# Patient Record
Sex: Female | Born: 1950
Health system: Southern US, Community
[De-identification: ages and names within clinical notes are randomized; demographics above are authoritative.]

## PROBLEM LIST (undated history)

## (undated) DIAGNOSIS — T7840XA Allergy, unspecified, initial encounter: Secondary | ICD-10-CM

## (undated) DIAGNOSIS — G4730 Sleep apnea, unspecified: Secondary | ICD-10-CM

## (undated) DIAGNOSIS — L0390 Cellulitis, unspecified: Secondary | ICD-10-CM

## (undated) DIAGNOSIS — K219 Gastro-esophageal reflux disease without esophagitis: Secondary | ICD-10-CM

## (undated) DIAGNOSIS — L4050 Arthropathic psoriasis, unspecified: Secondary | ICD-10-CM

## (undated) DIAGNOSIS — I10 Essential (primary) hypertension: Secondary | ICD-10-CM

## (undated) DIAGNOSIS — E119 Type 2 diabetes mellitus without complications: Secondary | ICD-10-CM

## (undated) DIAGNOSIS — D171 Benign lipomatous neoplasm of skin and subcutaneous tissue of trunk: Secondary | ICD-10-CM

## (undated) DIAGNOSIS — M1990 Unspecified osteoarthritis, unspecified site: Secondary | ICD-10-CM

## (undated) DIAGNOSIS — R221 Localized swelling, mass and lump, neck: Secondary | ICD-10-CM

## (undated) HISTORY — DX: Essential (primary) hypertension: I10

## (undated) HISTORY — DX: Localized swelling, mass and lump, neck: R22.1

## (undated) HISTORY — PX: TUBAL LIGATION: SHX77

## (undated) HISTORY — DX: Arthropathic psoriasis, unspecified: L40.50

## (undated) HISTORY — DX: Allergy, unspecified, initial encounter: T78.40XA

## (undated) HISTORY — DX: Gastro-esophageal reflux disease without esophagitis: K21.9

## (undated) HISTORY — DX: Cellulitis, unspecified: L03.90

## (undated) HISTORY — DX: Sleep apnea, unspecified: G47.30

## (undated) HISTORY — DX: Unspecified osteoarthritis, unspecified site: M19.90

## (undated) HISTORY — DX: Benign lipomatous neoplasm of skin and subcutaneous tissue of trunk: D17.1

## (undated) HISTORY — PX: ABDOMINAL HYSTERECTOMY: SHX81

---
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
## 1998-04-30 HISTORY — PX: ABDOMINAL HYSTERECTOMY: SHX81

## 1998-04-30 HISTORY — PX: SUPRACERVICAL ABDOMINAL HYSTERECTOMY: SHX5393

## 2005-07-13 ENCOUNTER — Encounter: Admission: RE | Admit: 2005-07-13 | Discharge: 2005-07-13 | Payer: Self-pay | Admitting: Family Medicine

## 2006-07-29 ENCOUNTER — Encounter: Admission: RE | Admit: 2006-07-29 | Discharge: 2006-07-29 | Payer: Self-pay | Admitting: Family Medicine

## 2006-08-02 ENCOUNTER — Encounter: Admission: RE | Admit: 2006-08-02 | Discharge: 2006-08-02 | Payer: Self-pay | Admitting: Family Medicine

## 2007-01-16 ENCOUNTER — Encounter: Admission: RE | Admit: 2007-01-16 | Discharge: 2007-01-16 | Payer: Self-pay | Admitting: Family Medicine

## 2007-03-18 ENCOUNTER — Ambulatory Visit: Payer: Self-pay | Admitting: Internal Medicine

## 2008-03-18 ENCOUNTER — Ambulatory Visit: Payer: Self-pay

## 2009-03-21 ENCOUNTER — Ambulatory Visit: Payer: Self-pay | Admitting: Family Medicine

## 2010-02-07 ENCOUNTER — Ambulatory Visit: Payer: Self-pay | Admitting: Family Medicine

## 2010-02-23 ENCOUNTER — Ambulatory Visit: Payer: Self-pay | Admitting: Family Medicine

## 2010-05-21 ENCOUNTER — Encounter: Payer: Self-pay | Admitting: Family Medicine

## 2011-02-08 ENCOUNTER — Ambulatory Visit: Payer: Self-pay | Admitting: Family Medicine

## 2011-02-19 ENCOUNTER — Ambulatory Visit: Payer: Self-pay | Admitting: Family Medicine

## 2012-02-13 ENCOUNTER — Ambulatory Visit: Payer: Self-pay | Admitting: Family Medicine

## 2012-04-30 HISTORY — PX: OTHER SURGICAL HISTORY: SHX169

## 2013-02-05 ENCOUNTER — Ambulatory Visit: Payer: Self-pay | Admitting: Family Medicine

## 2013-02-13 ENCOUNTER — Ambulatory Visit: Payer: Self-pay | Admitting: Family Medicine

## 2013-03-04 DIAGNOSIS — L4050 Arthropathic psoriasis, unspecified: Secondary | ICD-10-CM | POA: Insufficient documentation

## 2013-03-24 ENCOUNTER — Encounter: Payer: Self-pay | Admitting: *Deleted

## 2013-04-07 ENCOUNTER — Ambulatory Visit (INDEPENDENT_AMBULATORY_CARE_PROVIDER_SITE_OTHER): Payer: BC Managed Care – PPO | Admitting: General Surgery

## 2013-04-07 ENCOUNTER — Encounter: Payer: Self-pay | Admitting: General Surgery

## 2013-04-07 ENCOUNTER — Ambulatory Visit: Payer: BC Managed Care – PPO

## 2013-04-07 VITALS — BP 118/78 | HR 62 | Resp 12 | Ht 64.0 in | Wt 165.0 lb

## 2013-04-07 DIAGNOSIS — R221 Localized swelling, mass and lump, neck: Secondary | ICD-10-CM | POA: Insufficient documentation

## 2013-04-07 DIAGNOSIS — R220 Localized swelling, mass and lump, head: Secondary | ICD-10-CM

## 2013-04-07 HISTORY — DX: Localized swelling, mass and lump, neck: R22.1

## 2013-04-07 NOTE — Progress Notes (Addendum)
Patient ID: Ashley Rodriguez, female   DOB: [birth year], 62 y.o.   MRN: [id number]  Chief Complaint  Patient presents with  . Other    lipoma on neck    HPI Ashley Rodriguez is a 62 y.o. female here today for a evaluation. Patient had a lipoma removed in January 2014 and states that area is starting to bother her again. The tags in her clothes bother the area.  The patient underwent excision of December 2013. At the time of her January 2014 exam she still appreciated thickening in this area which at that time was thought secondary to post procedure swelling. HPI  Past Medical History  Diagnosis Date  . Arthritis     Past Surgical History  Procedure Laterality Date  . Abdominal hysterectomy  2000  . Lipoma removed  04/2012    on neck    No family history on file.  Social History History  Substance Use Topics  . Smoking status: Never Smoker   . Smokeless tobacco: Never Used  . Alcohol Use: 3.0 oz/week    6 drink(s) per week    Allergies  Allergen Reactions  . Codeine Rash  . Sulfa Antibiotics Rash    Current Outpatient Prescriptions  Medication Sig Dispense Refill  . amLODipine (NORVASC) 5 MG tablet Take 5 mg by mouth daily.      Marland Kitchen atenolol (TENORMIN) 100 MG tablet Take 100 mg by mouth daily.      . calcium carbonate (CALCIUM 600) 600 MG TABS tablet Take 600 mg by mouth daily with breakfast.      . etanercept (ENBREL) 50 MG/ML injection Inject 50 mg into the skin once a week.      . Multiple Vitamins-Minerals (MULTIVITAMIN WITH MINERALS) tablet Take 1 tablet by mouth daily.      . naproxen sodium (ANAPROX) 220 MG tablet Take 220 mg by mouth 2 (two) times daily with a meal.      . piroxicam (FELDENE) 20 MG capsule Take 20 mg by mouth daily.      . prednisoLONE (ORAPRED ODT) 10 MG disintegrating tablet Take 10 mg by mouth daily.      . ranitidine (ZANTAC) 150 MG tablet Take 150 mg by mouth 2 (two) times daily.       No current facility-administered medications for this visit.     Review of Systems Review of Systems  Constitutional: Negative.   Respiratory: Negative.   Cardiovascular: Negative.     Blood pressure 118/78, pulse 62, resp. rate 12, height 5\' 4"  (1.626 m), weight 165 lb (74.844 kg).  Physical Exam Physical Exam  Constitutional: She is oriented to person, place, and time. She appears well-developed and well-nourished.  Eyes: No scleral icterus.  Neck:    Left posterior neck 3 cm fullness left of scar.  Cardiovascular: Normal rate, regular rhythm and normal heart sounds.   Pulmonary/Chest: Breath sounds normal.  Lymphadenopathy:    She has no cervical adenopathy.  Neurological: She is alert and oriented to person, place, and time.  Skin: Skin is warm and dry.    Data Reviewed Pathology from the April 01, 2012 excision showed dense fibrous tissue and fibroadipose tissue without evidence of malignancy. 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 2001 biopsy dated April 15, 2000 showed a lipoma in the left posterior neck.  Focused ultrasound examination of this area (no images, no charge) showed focal thickening in the subcutaneous tissue extending down to the level of the underlying  muscle without a well-defined mass. No vascular flow. This measured up to 3 cm in diameter.  Assessment    Left posterior neck mass. Chronic scarring versus underlying mass.    Plan    Options for management were reviewed: 1 continued observation unless increasing pain or sensitivity develops versus 2 formal evaluation with open neck MRI (history of claustrophobia) and formal excision under general anesthesia.  The patient will notify the office of how she would like to proceed.       Earline Mayotte 04/07/2013, 8:46 PM

## 2013-04-07 NOTE — Patient Instructions (Addendum)
Recommend an open MRI or patient to return and have the area removed. Patient to call us and let us know what she would like to do.

## 2013-04-08 ENCOUNTER — Other Ambulatory Visit: Payer: BC Managed Care – PPO

## 2013-04-08 DIAGNOSIS — R221 Localized swelling, mass and lump, neck: Secondary | ICD-10-CM

## 2013-04-08 NOTE — Addendum Note (Signed)
Addended by: Volney Presser on: 04/08/2013 12:16 PM   Modules accepted: Orders

## 2013-04-08 NOTE — Addendum Note (Signed)
Addended by: Volney Presser on: 04/08/2013 12:20 PM   Modules accepted: Orders

## 2013-04-28 ENCOUNTER — Encounter: Payer: Self-pay | Admitting: Podiatry

## 2013-04-28 ENCOUNTER — Ambulatory Visit (INDEPENDENT_AMBULATORY_CARE_PROVIDER_SITE_OTHER): Payer: BC Managed Care – PPO | Admitting: Podiatry

## 2013-04-28 VITALS — BP 134/68 | HR 60 | Resp 16 | Ht 64.0 in | Wt 162.0 lb

## 2013-04-28 DIAGNOSIS — Z79899 Other long term (current) drug therapy: Secondary | ICD-10-CM

## 2013-04-28 DIAGNOSIS — B351 Tinea unguium: Secondary | ICD-10-CM

## 2013-04-28 DIAGNOSIS — M069 Rheumatoid arthritis, unspecified: Secondary | ICD-10-CM

## 2013-04-28 MED ORDER — TERBINAFINE HCL 250 MG PO TABS: 250.0000 mg | ORAL_TABLET | Freq: Every day | ORAL | Status: DC

## 2013-04-29 LAB — HEPATIC FUNCTION PANEL
ALT: 23 IU/L (ref 0–32)
AST: 19 IU/L (ref 0–40)
Alkaline Phosphatase: 55 IU/L (ref 39–117)
Bilirubin, Direct: 0.1 mg/dL (ref 0.00–0.40)
Total Bilirubin: 0.3 mg/dL (ref 0.0–1.2)
Total Protein: 6.7 g/dL (ref 6.0–8.5)

## 2013-04-29 NOTE — Progress Notes (Signed)
Subjective:     Patient ID: Ashley Rodriguez, female   DOB: [birth year], 62 y.o.   MRN: [id number]  HPI patient presents stating several of my nails are still discolored and I want to see if there is anything we can do to help them. She states that she has not had laser done for approximately 10 months   Review of Systems     Objective:   Physical Exam Neurovascular status intact with no health history changes noted. Significant for foot malalignment secondary to psoriatic arthritis with nail disease of the big toenails both feet. Remaining nails have responded well to laser    Assessment:     Improve nail condition with continued probable fungal or psoriatic condition of the hallux nail both feet    Plan:     Reviewed condition and explained origin of nail disease along with trauma. Patient would like to try other treatment options and I have recommended oral treatment at this time reviewing the risk of oral treatment. She will confirm with her doctor that she can take Lamisil with her other medications and she is written a prescription today for 3 months of once a day treatment. I am sending for liver function panel prior to initiation of this treatment and reappoint 4 months earlier if any issues should occur

## 2013-04-30 HISTORY — PX: COLONOSCOPY: SHX174

## 2013-05-06 ENCOUNTER — Encounter: Payer: Self-pay | Admitting: General Surgery

## 2013-08-18 ENCOUNTER — Encounter: Payer: Self-pay | Admitting: Podiatry

## 2013-08-18 ENCOUNTER — Ambulatory Visit: Payer: BC Managed Care – PPO | Admitting: Podiatry

## 2013-08-18 VITALS — BP 161/88 | HR 62 | Resp 12

## 2013-08-18 DIAGNOSIS — M79609 Pain in unspecified limb: Secondary | ICD-10-CM

## 2013-08-18 DIAGNOSIS — B351 Tinea unguium: Secondary | ICD-10-CM

## 2013-08-18 NOTE — Progress Notes (Signed)
Subjective:     Patient ID: Ashley Rodriguez, female   DOB: 13-Oct-1950, 63 y.o.   MRN: [id number]  HPI patient states she is doing very well after having Lamisil and topical treatment for nails hallux both feet   Review of Systems     Objective:   Physical Exam Neurovascular status intact with nail disease which is getting better hallux both feet with distal yellow numbness in the one third category versus the entire nails at last visit    Assessment:     Doing well from chronic fungal infection of the big toes both feet    Plan:     Continue topical formula 3 usage and we will do 30 days of oral Lamisil in the fall. Overall doing well

## 2013-08-25 ENCOUNTER — Ambulatory Visit: Payer: BC Managed Care – PPO | Admitting: Podiatry

## 2013-09-17 ENCOUNTER — Ambulatory Visit: Payer: Self-pay | Admitting: Family Medicine

## 2014-02-26 LAB — HM COLONOSCOPY

## 2014-03-01 ENCOUNTER — Encounter: Payer: Self-pay | Admitting: Podiatry

## 2014-03-29 ENCOUNTER — Other Ambulatory Visit: Payer: Self-pay | Admitting: *Deleted

## 2014-03-29 NOTE — Telephone Encounter (Signed)
Patient called and stated that dr regal told her that she can have a refill on lamisil , she states she was suppose to call back in October and we would just refill the prescription. i explained to patient that she would need a follow up appointment , patient got upset and stated that she knows what dr Paulla Dolly said and that she just needed to call and get a refill for maintenance .

## 2014-03-30 MED ORDER — TERBINAFINE HCL 250 MG PO TABS: 250.0000 mg | ORAL_TABLET | Freq: Every day | ORAL | Status: DC

## 2014-03-30 NOTE — Telephone Encounter (Signed)
OK FOR LAMISIL PER DR REGAL

## 2014-03-31 NOTE — Telephone Encounter (Signed)
Give her 30 days worth of lamisil

## 2014-04-15 ENCOUNTER — Encounter: Payer: Self-pay | Admitting: General Surgery

## 2014-04-15 ENCOUNTER — Ambulatory Visit (INDEPENDENT_AMBULATORY_CARE_PROVIDER_SITE_OTHER): Payer: BC Managed Care – PPO | Admitting: General Surgery

## 2014-04-15 VITALS — BP 130/68 | HR 70 | Resp 12 | Ht 64.0 in | Wt 165.0 lb

## 2014-04-15 DIAGNOSIS — R221 Localized swelling, mass and lump, neck: Secondary | ICD-10-CM

## 2014-04-15 NOTE — Progress Notes (Signed)
Patient ID: Ashley Rodriguez, female   DOB: Aug 13, 1950, 63 y.o.   MRN: [id number]  Chief Complaint  Patient presents with  . Other    lipoma on neck    HPI Ashley Rodriguez is a 63 y.o. female here today for a re evaluation. Patient had a lipoma removed in January 2014 and states that area is starting to bother her again. The area was excised in December 2013 and she was seen in December 2014 with prominence of the lateral margin. At that time about a 3 cm fullness was appreciated.  The patient has been experiencing mild discomfort in this area, and also reports some tingling into the shoulder. The latter is unlikely to be related to the former. HPI  Past Medical History  Diagnosis Date  . Arthritis     Past Surgical History  Procedure Laterality Date  . Abdominal hysterectomy  2000  . Lipoma removed  04/2012    on neck  . Colonoscopy  2015    No family history on file.  Social History History  Substance Use Topics  . Smoking status: Never Smoker   . Smokeless tobacco: Never Used  . Alcohol Use: 3.0 oz/week    6 drink(s) per week    Allergies  Allergen Reactions  . Codeine Rash  . Sulfa Antibiotics Rash    Current Outpatient Prescriptions  Medication Sig Dispense Refill  . amLODipine (NORVASC) 5 MG tablet Take 5 mg by mouth daily.    Marland Kitchen atenolol (TENORMIN) 100 MG tablet Take 100 mg by mouth daily.    . calcium carbonate (CALCIUM 600) 600 MG TABS tablet Take 600 mg by mouth daily with breakfast.    . etanercept (ENBREL) 50 MG/ML injection Inject 50 mg into the skin once a week.    . Multiple Vitamins-Minerals (MULTIVITAMIN WITH MINERALS) tablet Take 1 tablet by mouth daily.    . piroxicam (FELDENE) 20 MG capsule Take 20 mg by mouth daily.    . polyethylene glycol-electrolytes (NULYTELY/GOLYTELY) 420 G solution See admin instructions.  0  . prednisoLONE (ORAPRED ODT) 10 MG disintegrating tablet Take 5 mg by mouth daily.     . ranitidine (ZANTAC) 300 MG capsule   6  .  terbinafine (LAMISIL) 250 MG tablet Take 1 tablet (250 mg total) by mouth daily. 30 tablet 0   No current facility-administered medications for this visit.    Review of Systems Review of Systems  Constitutional: Negative.   Respiratory: Negative.   Cardiovascular: Negative.     Blood pressure 130/68, pulse 70, resp. rate 12, height 5\' 4"  (1.626 m), weight 165 lb (74.844 kg).  Physical Exam Physical Exam  Constitutional: She is oriented to person, place, and time. She appears well-developed and well-nourished.  HENT:  Head:    Eyes: Conjunctivae are normal. No scleral icterus.  Neck: Neck supple.  3 by 4 left neck mass.   Cardiovascular: Normal rate, regular rhythm and normal heart sounds.   Pulmonary/Chest: Effort normal and breath sounds normal.  Lymphadenopathy:    She has no cervical adenopathy.  Neurological: She is alert and oriented to person, place, and time.  Skin: Skin is warm and dry.    Data Reviewed Pathology showed benign dense fibrous and fibroadipose tissue.  Assessment    Symptomatic nodularity of the left neck, likely residual fibroadipose tissue.    Plan    Patient to be scheduled for a MRI and excision neck mass .  Patient is scheduled for an open neck MRI with  and with out contrast at [place] for 04/25/14 at 3:00 pm. She will arrive by 1:25 pm, their is no special preparation needed. She is scheduled for surgery at [facility] on 04/28/14. She will pre admit by phone on 04/20/14. Surgery instructions and directions to [facility] have been mailed to the patient. She is aware of dates, time, and instructions.   The risks associated with surgery as well as potential injury of the spinal accessory nerve were reviewed.  PCP:  Jannet Mantis 04/16/2014, 12:12 PM

## 2014-04-15 NOTE — Patient Instructions (Addendum)
Patient to be scheduled for a MRI and excision neck mass .  Patient is scheduled for an open neck MRI with and with out contrast at [place] for 04/25/14 at 3:00 pm. She will arrive by 1:25 pm, their is no special preparation needed. She is scheduled for surgery at [facility] on 04/28/14. She will pre admit by phone on 04/20/14. Surgery instructions and directions to [facility] have been mailed to the patient. She is aware of dates, time, and instructions.

## 2014-04-16 ENCOUNTER — Other Ambulatory Visit: Payer: Self-pay | Admitting: General Surgery

## 2014-04-16 ENCOUNTER — Telehealth: Payer: Self-pay

## 2014-04-16 ENCOUNTER — Ambulatory Visit: Payer: Self-pay | Admitting: General Surgery

## 2014-04-16 DIAGNOSIS — R221 Localized swelling, mass and lump, neck: Secondary | ICD-10-CM

## 2014-04-16 DIAGNOSIS — IMO0002 Reserved for concepts with insufficient information to code with codable children: Secondary | ICD-10-CM

## 2014-04-16 DIAGNOSIS — R229 Localized swelling, mass and lump, unspecified: Principal | ICD-10-CM

## 2014-04-16 LAB — CREATININE, SERUM: CREATININE: 0.68 mg/dL (ref 0.60–1.30)

## 2014-04-16 LAB — BUN: BUN: 13 mg/dL (ref 7–18)

## 2014-04-16 NOTE — Telephone Encounter (Signed)
Patient contacted about scheduling her neck MRI and surgery. Patient is scheduled for an open neck MRI with and with out contrast at [place] for 04/25/14 at 3:00 pm. She will arrive by 1:25 pm, their is no special preparation needed. She will go today to the hospital to have her BUN and Creatinine labs drawn, these will be faxed to [place] once results are back. She is scheduled for surgery at [facility] on 04/28/14. She will pre admit by phone on 04/20/14. Surgery instructions and directions to [facility] have been mailed to the patient. She is aware of dates, time, and instructions.

## 2014-04-19 ENCOUNTER — Encounter: Payer: Self-pay | Admitting: General Surgery

## 2014-04-21 ENCOUNTER — Ambulatory Visit: Payer: Self-pay | Admitting: Anesthesiology

## 2014-04-25 ENCOUNTER — Ambulatory Visit
Admission: RE | Admit: 2014-04-25 | Discharge: 2014-04-25 | Disposition: A | Discharge status: Home / Self Care | Payer: Self-pay | Source: Ambulatory Visit | Attending: General Surgery | Admitting: General Surgery

## 2014-04-25 DIAGNOSIS — IMO0002 Reserved for concepts with insufficient information to code with codable children: Secondary | ICD-10-CM

## 2014-04-25 DIAGNOSIS — R229 Localized swelling, mass and lump, unspecified: Principal | ICD-10-CM

## 2014-04-26 ENCOUNTER — Telehealth: Payer: Self-pay

## 2014-04-26 NOTE — Telephone Encounter (Signed)
-----   Message from Robert Bellow, MD sent at 04/26/2014  2:12 PM EST ----- Please schedule CT neck w/ contrast: left posterior neck mass. If this can be done tomorrow, we can proceed with planned excision on Wednesday. If not, the OR date will need to be rescheduled. Thanks.

## 2014-04-26 NOTE — Telephone Encounter (Signed)
Patient called and said that she was unable to have her open MRI done on 04/25/14. She states that it was not "open enough" for her and she was not able to complete her scan. She was concerned about not being able to have her surgery done on 04/28/14. I let her know that I would contact Dr Bary Castilla and let him know and we would call her back about her surgery.

## 2014-04-26 NOTE — Telephone Encounter (Signed)
Spoke with patient about scheduling a CT of the neck with contrast, patient is amendable to this. Patient is scheduled for CT with contrast of the neck at [facility] location on 04/27/14 at 10:00 am. She is to arrive by 9:45 am and to bring a list of medications with her. Their is no special prep needed for this scan. Patient is aware of date, time, and instructions.

## 2014-04-27 ENCOUNTER — Encounter: Payer: Self-pay | Admitting: General Surgery

## 2014-04-27 ENCOUNTER — Ambulatory Visit: Payer: Self-pay | Admitting: General Surgery

## 2014-04-28 ENCOUNTER — Ambulatory Visit: Payer: Self-pay | Admitting: General Surgery

## 2014-04-28 DIAGNOSIS — D170 Benign lipomatous neoplasm of skin and subcutaneous tissue of head, face and neck: Secondary | ICD-10-CM

## 2014-04-28 HISTORY — PX: NECK MASS EXCISION: SHX2079

## 2014-04-29 ENCOUNTER — Encounter: Payer: Self-pay | Admitting: General Surgery

## 2014-05-03 ENCOUNTER — Encounter: Payer: Self-pay | Admitting: General Surgery

## 2014-05-03 ENCOUNTER — Ambulatory Visit (INDEPENDENT_AMBULATORY_CARE_PROVIDER_SITE_OTHER): Payer: BLUE CROSS/BLUE SHIELD | Admitting: General Surgery

## 2014-05-03 VITALS — BP 124/70 | HR 76 | Resp 12 | Ht 64.0 in | Wt 167.0 lb

## 2014-05-03 DIAGNOSIS — R221 Localized swelling, mass and lump, neck: Secondary | ICD-10-CM

## 2014-05-03 NOTE — Progress Notes (Signed)
Patient ID: Ashley Rodriguez, female   DOB: [birth year], 64 y.o.   MRN: [id number]  Chief Complaint  Patient presents with  . Routine Post Op    neck lesion    HPI Ashley Rodriguez is a 64 y.o. female. here today for her post op excision neck mass done on 04/28/14. Patient states she is doing well. HPI  Past Medical History  Diagnosis Date  . Arthritis     Past Surgical History  Procedure Laterality Date  . Abdominal hysterectomy  2000  . Lipoma removed  04/2012    on neck  . Colonoscopy  2015  . Neck mass excision  04/28/14    No family history on file.  Social History History  Substance Use Topics  . Smoking status: Never Smoker   . Smokeless tobacco: Never Used  . Alcohol Use: 3.0 oz/week    6 drink(s) per week    Allergies  Allergen Reactions  . Codeine Rash  . Sulfa Antibiotics Rash    Current Outpatient Prescriptions  Medication Sig Dispense Refill  . amLODipine (NORVASC) 5 MG tablet Take 5 mg by mouth daily.    Marland Kitchen atenolol (TENORMIN) 100 MG tablet Take 100 mg by mouth daily.    . calcium carbonate (CALCIUM 600) 600 MG TABS tablet Take 600 mg by mouth daily with breakfast.    . etanercept (ENBREL) 50 MG/ML injection Inject 50 mg into the skin once a week.    . Multiple Vitamins-Minerals (MULTIVITAMIN WITH MINERALS) tablet Take 1 tablet by mouth daily.    . piroxicam (FELDENE) 20 MG capsule Take 20 mg by mouth daily.    . prednisoLONE (ORAPRED ODT) 10 MG disintegrating tablet Take 5 mg by mouth daily.     . ranitidine (ZANTAC) 300 MG capsule   6  . terbinafine (LAMISIL) 250 MG tablet Take 1 tablet (250 mg total) by mouth daily. 30 tablet 0  . polyethylene glycol-electrolytes (NULYTELY/GOLYTELY) 420 G solution See admin instructions.  0   No current facility-administered medications for this visit.    Review of Systems Review of Systems  Constitutional: Negative.   Respiratory: Negative.   Cardiovascular: Negative.     Blood pressure 124/70, pulse 76, resp.  rate 12, height 5\' 4"  (1.626 m), weight 167 lb (75.751 kg).  Physical Exam Physical Exam  Constitutional: She is oriented to person, place, and time. She appears well-developed and well-nourished.  Eyes: Conjunctivae are normal. No scleral icterus.  Neck: Neck supple.  Left neck Incision looks clean and healing well.    Lymphadenopathy:    She has no cervical adenopathy.  Neurological: She is alert and oriented to person, place, and time.  Skin: Skin is warm and dry.    Data Reviewed Pathology pending  Assessment    Doing well status post reexcision left base of the neck lipoma.    Plan    Daily application of cream to soften the scars was encouraged. She may use a dressing if desired. Patient to return as needed.       PCP: Jannet Mantis 05/03/2014, 7:56 PM

## 2014-05-04 ENCOUNTER — Encounter: Payer: Self-pay | Admitting: General Surgery

## 2014-07-02 ENCOUNTER — Ambulatory Visit (INDEPENDENT_AMBULATORY_CARE_PROVIDER_SITE_OTHER): Payer: BLUE CROSS/BLUE SHIELD | Admitting: Podiatry

## 2014-07-02 VITALS — BP 130/71 | HR 63 | Resp 16

## 2014-07-02 DIAGNOSIS — M216X9 Other acquired deformities of unspecified foot: Secondary | ICD-10-CM

## 2014-07-02 DIAGNOSIS — Q667 Congenital pes cavus: Secondary | ICD-10-CM

## 2014-07-02 DIAGNOSIS — L84 Corns and callosities: Secondary | ICD-10-CM | POA: Diagnosis not present

## 2014-07-02 DIAGNOSIS — B351 Tinea unguium: Secondary | ICD-10-CM | POA: Diagnosis not present

## 2014-07-03 NOTE — Progress Notes (Signed)
Subjective:     Patient ID: Ashley Rodriguez, female   DOB: 10-Sep-1950, 64 y.o.   MRN: [id number]  HPI patient presents with painful lesion on the bottom of the left foot that's making walking and shoe gear difficult and states that she has tried to take care of herself   Review of Systems     Objective:   Physical Exam Neurovascular status intact with muscle strength adequate and noted to have plantar keratotic lesion left it's painful when pressed and making walking and shoe gear difficult    Assessment:     Plantarflexed metatarsal with keratotic lesion formation and pain    Plan:     H&P condition discussed and active debridement accomplished with possibility for orthotics or bone structural changes or injection if symptoms persist

## 2014-08-05 ENCOUNTER — Ambulatory Visit (INDEPENDENT_AMBULATORY_CARE_PROVIDER_SITE_OTHER): Payer: BLUE CROSS/BLUE SHIELD | Admitting: General Surgery

## 2014-08-05 ENCOUNTER — Encounter: Payer: Self-pay | Admitting: General Surgery

## 2014-08-05 DIAGNOSIS — D170 Benign lipomatous neoplasm of skin and subcutaneous tissue of head, face and neck: Secondary | ICD-10-CM

## 2014-08-05 NOTE — Patient Instructions (Signed)
The patient is aware to call back for any questions or concerns.  

## 2014-08-05 NOTE — Progress Notes (Signed)
Patient ID: Delani Kohli, female   DOB: May 23, 1950, 64 y.o.   MRN: [id number]  Chief Complaint  Patient presents with  . Follow-up    HPI Ravonda Brecheen is a 64 y.o. female.  Here today for evaluation of puffiness and swelling at surgical site. She is post excision neck mass done on 04/28/14 that showed to be a lipoma. She is using vitamin E cream twice a day.   HPI  Past Medical History  Diagnosis Date  . Arthritis     Past Surgical History  Procedure Laterality Date  . Abdominal hysterectomy  2000  . Lipoma removed  04/2012    on neck  . Colonoscopy  2015  . Neck mass excision  04/28/14    lipoma    No family history on file.  Social History History  Substance Use Topics  . Smoking status: Never Smoker   . Smokeless tobacco: Never Used  . Alcohol Use: 3.0 oz/week    6 drink(s) per week    Allergies  Allergen Reactions  . Codeine Rash  . Sulfa Antibiotics Rash    Current Outpatient Prescriptions  Medication Sig Dispense Refill  . amLODipine (NORVASC) 5 MG tablet Take 5 mg by mouth daily.    Marland Kitchen atenolol (TENORMIN) 100 MG tablet Take 100 mg by mouth daily.    . calcium carbonate (CALCIUM 600) 600 MG TABS tablet Take 600 mg by mouth daily with breakfast.    . etanercept (ENBREL) 50 MG/ML injection Inject 50 mg into the skin once a week.    . Multiple Vitamins-Minerals (MULTIVITAMIN WITH MINERALS) tablet Take 1 tablet by mouth daily.    . piroxicam (FELDENE) 20 MG capsule Take 20 mg by mouth daily.    . prednisoLONE (ORAPRED ODT) 10 MG disintegrating tablet Take 10 mg by mouth daily.     . valACYclovir (VALTREX) 500 MG tablet Take 500 mg by mouth as needed.    . ranitidine (ZANTAC) 300 MG capsule Take 300 mg by mouth every evening.   6   No current facility-administered medications for this visit.    Review of Systems Review of Systems  Constitutional: Negative.   Respiratory: Negative.   Cardiovascular: Negative.     Blood pressure 120/72, pulse 64, resp.  rate 12, height 5\' 4"  (1.626 m), weight 168 lb (76.204 kg).  Physical Exam Physical Exam  Constitutional: She is oriented to person, place, and time. She appears well-developed and well-nourished.  Neck:    Neurological: She is alert and oriented to person, place, and time.  Skin: Skin is warm and dry.  Posterior neck incision healed.    Data Reviewed December 2015 operative report and pathology report.  Assessment    Asymmetry secondary to scarring at the site of reexcision.    Plan    Patient was reassured there is no evidence of recurrent lipoma formation. She may make use of massage or scar creams as desired.    Follow up as needed.   PCP:  Jannet Mantis 08/06/2014, 9:36 PM

## 2014-08-21 NOTE — Op Note (Signed)
PATIENT NAME:  Ashley Rodriguez, SITZMANN MR#:  [id number] DATE OF BIRTH:  1950-09-04  DATE OF PROCEDURE:  04/28/2014  PREOPERATIVE DIAGNOSIS:  Recurrent lipoma of the posterior neck.   POSTOPERATIVE DIAGNOSIS: Recurrent lipoma of the posterior neck.   OPERATIVE PROCEDURE: Excision of lipoma of the posterior neck.   SURGEON: Robert Bellow, MD  ANESTHESIA: General endotracheal under Dr. Myra Gianotti, Marcaine 0.5% with 1:200,000 units of epinephrine, 20 mL local infiltration.   ESTIMATED BLOOD LOSS: Minimal.   CLINICAL NOTE: This 64 year old woman has had a multiply recurrent posterior neck lipoma. The area has become increasingly symptomatic. CT scan showed an up to 5 cm diameter lipoma above the level of the deep fascia.  She was admitted for elective excision.   OPERATIVE NOTE: With the patient under general anesthesia, she was rolled to the prone position and carefully padded and protected from pressure points. The skin of the back was retracted making use of broad-based tape. The area of surgery was then prepped with ChloraPrep and draped. The old incision was opened and extended approximately 1 cm both to the right and to the left of the midline. The skin and subcutaneous tissue was divided sharply and the remaining dissection completed with electrocautery. All the pseudopods of the lipoma-like mass were removed. The excision was carried down to the deep fascia which in part was removed with the specimen. The inferior skin was then mobilized with cautery and the wound was closed in layers with interrupted 2-0 Vicryl figure-of-8 sutures to obliterate the dead space and the skin actually approximated with a running 3-0 Vicryl deep dermal suture. Benzoin and Steri-Strips followed by Telfa and Tegaderm dressings were then applied.   The patient tolerated the procedure well and was taken to the recovery room in stable condition.   ____________________________ Robert Bellow, MD jwb:LT D: 04/28/2014  20:50:48 ET T: 04/28/2014 21:12:58 ET JOB#: [id number]  cc: Robert Bellow, MD, <Dictator> Shaneal Barasch Amedeo Kinsman MD ELECTRONICALLY SIGNED 04/29/2014 8:47

## 2014-08-23 LAB — SURGICAL PATHOLOGY

## 2014-09-23 ENCOUNTER — Other Ambulatory Visit: Payer: Self-pay | Admitting: Family Medicine

## 2014-09-23 DIAGNOSIS — R928 Other abnormal and inconclusive findings on diagnostic imaging of breast: Secondary | ICD-10-CM

## 2014-10-01 ENCOUNTER — Ambulatory Visit: Payer: Self-pay

## 2014-10-01 ENCOUNTER — Ambulatory Visit
Admission: RE | Admit: 2014-10-01 | Discharge: 2014-10-01 | Disposition: A | Discharge status: Home / Self Care | Payer: BLUE CROSS/BLUE SHIELD | Source: Ambulatory Visit | Attending: Family Medicine | Admitting: Family Medicine

## 2014-10-01 ENCOUNTER — Other Ambulatory Visit: Payer: Self-pay | Admitting: Family Medicine

## 2014-10-01 DIAGNOSIS — R928 Other abnormal and inconclusive findings on diagnostic imaging of breast: Secondary | ICD-10-CM | POA: Diagnosis not present

## 2014-10-01 DIAGNOSIS — R921 Mammographic calcification found on diagnostic imaging of breast: Secondary | ICD-10-CM | POA: Insufficient documentation

## 2015-08-25 DIAGNOSIS — B078 Other viral warts: Secondary | ICD-10-CM | POA: Diagnosis not present

## 2015-08-25 DIAGNOSIS — D1801 Hemangioma of skin and subcutaneous tissue: Secondary | ICD-10-CM | POA: Diagnosis not present

## 2015-08-25 DIAGNOSIS — C44529 Squamous cell carcinoma of skin of other part of trunk: Secondary | ICD-10-CM | POA: Diagnosis not present

## 2015-08-25 DIAGNOSIS — D485 Neoplasm of uncertain behavior of skin: Secondary | ICD-10-CM | POA: Diagnosis not present

## 2015-08-25 DIAGNOSIS — L812 Freckles: Secondary | ICD-10-CM | POA: Diagnosis not present

## 2015-08-25 DIAGNOSIS — L570 Actinic keratosis: Secondary | ICD-10-CM | POA: Diagnosis not present

## 2015-08-25 DIAGNOSIS — Z1283 Encounter for screening for malignant neoplasm of skin: Secondary | ICD-10-CM | POA: Diagnosis not present

## 2015-08-25 DIAGNOSIS — Z808 Family history of malignant neoplasm of other organs or systems: Secondary | ICD-10-CM | POA: Diagnosis not present

## 2015-09-14 DIAGNOSIS — C44529 Squamous cell carcinoma of skin of other part of trunk: Secondary | ICD-10-CM | POA: Diagnosis not present

## 2015-09-20 DIAGNOSIS — E7800 Pure hypercholesterolemia, unspecified: Secondary | ICD-10-CM | POA: Diagnosis not present

## 2015-09-20 DIAGNOSIS — R7303 Prediabetes: Secondary | ICD-10-CM | POA: Diagnosis not present

## 2015-09-20 DIAGNOSIS — L4050 Arthropathic psoriasis, unspecified: Secondary | ICD-10-CM | POA: Diagnosis not present

## 2015-09-20 DIAGNOSIS — I10 Essential (primary) hypertension: Secondary | ICD-10-CM | POA: Diagnosis not present

## 2015-09-20 DIAGNOSIS — E041 Nontoxic single thyroid nodule: Secondary | ICD-10-CM | POA: Diagnosis not present

## 2015-10-13 DIAGNOSIS — Z79899 Other long term (current) drug therapy: Secondary | ICD-10-CM | POA: Diagnosis not present

## 2015-10-13 DIAGNOSIS — M2550 Pain in unspecified joint: Secondary | ICD-10-CM | POA: Diagnosis not present

## 2015-10-13 DIAGNOSIS — L409 Psoriasis, unspecified: Secondary | ICD-10-CM | POA: Diagnosis not present

## 2015-10-13 DIAGNOSIS — L4050 Arthropathic psoriasis, unspecified: Secondary | ICD-10-CM | POA: Diagnosis not present

## 2015-11-15 DIAGNOSIS — Z08 Encounter for follow-up examination after completed treatment for malignant neoplasm: Secondary | ICD-10-CM | POA: Diagnosis not present

## 2015-11-15 DIAGNOSIS — Z85828 Personal history of other malignant neoplasm of skin: Secondary | ICD-10-CM | POA: Diagnosis not present

## 2015-11-15 DIAGNOSIS — L905 Scar conditions and fibrosis of skin: Secondary | ICD-10-CM | POA: Diagnosis not present

## 2015-11-15 DIAGNOSIS — D235 Other benign neoplasm of skin of trunk: Secondary | ICD-10-CM | POA: Diagnosis not present

## 2016-02-02 DIAGNOSIS — Z23 Encounter for immunization: Secondary | ICD-10-CM | POA: Diagnosis not present

## 2016-02-14 DIAGNOSIS — L812 Freckles: Secondary | ICD-10-CM | POA: Diagnosis not present

## 2016-02-14 DIAGNOSIS — D1801 Hemangioma of skin and subcutaneous tissue: Secondary | ICD-10-CM | POA: Diagnosis not present

## 2016-02-14 DIAGNOSIS — L570 Actinic keratosis: Secondary | ICD-10-CM | POA: Diagnosis not present

## 2016-02-14 DIAGNOSIS — L821 Other seborrheic keratosis: Secondary | ICD-10-CM | POA: Diagnosis not present

## 2016-02-14 DIAGNOSIS — L400 Psoriasis vulgaris: Secondary | ICD-10-CM | POA: Diagnosis not present

## 2016-02-21 ENCOUNTER — Other Ambulatory Visit: Payer: Self-pay | Admitting: Family Medicine

## 2016-02-21 DIAGNOSIS — Z1231 Encounter for screening mammogram for malignant neoplasm of breast: Secondary | ICD-10-CM

## 2016-03-02 DIAGNOSIS — M2550 Pain in unspecified joint: Secondary | ICD-10-CM | POA: Diagnosis not present

## 2016-03-02 DIAGNOSIS — Z79899 Other long term (current) drug therapy: Secondary | ICD-10-CM | POA: Diagnosis not present

## 2016-03-02 DIAGNOSIS — L409 Psoriasis, unspecified: Secondary | ICD-10-CM | POA: Diagnosis not present

## 2016-03-02 DIAGNOSIS — M5432 Sciatica, left side: Secondary | ICD-10-CM | POA: Diagnosis not present

## 2016-03-02 DIAGNOSIS — L4050 Arthropathic psoriasis, unspecified: Secondary | ICD-10-CM | POA: Diagnosis not present

## 2016-03-07 ENCOUNTER — Ambulatory Visit
Admission: RE | Admit: 2016-03-07 | Discharge: 2016-03-07 | Disposition: A | Discharge status: Home / Self Care | Payer: BLUE CROSS/BLUE SHIELD | Source: Ambulatory Visit | Attending: Family Medicine | Admitting: Family Medicine

## 2016-03-07 DIAGNOSIS — Z1231 Encounter for screening mammogram for malignant neoplasm of breast: Secondary | ICD-10-CM | POA: Insufficient documentation

## 2016-04-18 ENCOUNTER — Encounter: Payer: Self-pay | Admitting: Podiatry

## 2016-04-18 ENCOUNTER — Ambulatory Visit (INDEPENDENT_AMBULATORY_CARE_PROVIDER_SITE_OTHER): Payer: BLUE CROSS/BLUE SHIELD | Admitting: Podiatry

## 2016-04-18 DIAGNOSIS — R7303 Prediabetes: Secondary | ICD-10-CM | POA: Diagnosis not present

## 2016-04-18 DIAGNOSIS — Q828 Other specified congenital malformations of skin: Secondary | ICD-10-CM | POA: Diagnosis not present

## 2016-04-18 DIAGNOSIS — H5203 Hypermetropia, bilateral: Secondary | ICD-10-CM | POA: Diagnosis not present

## 2016-04-18 DIAGNOSIS — E7800 Pure hypercholesterolemia, unspecified: Secondary | ICD-10-CM | POA: Diagnosis not present

## 2016-04-18 DIAGNOSIS — E041 Nontoxic single thyroid nodule: Secondary | ICD-10-CM | POA: Diagnosis not present

## 2016-04-18 DIAGNOSIS — L4050 Arthropathic psoriasis, unspecified: Secondary | ICD-10-CM | POA: Diagnosis not present

## 2016-04-18 DIAGNOSIS — I10 Essential (primary) hypertension: Secondary | ICD-10-CM | POA: Diagnosis not present

## 2016-04-19 NOTE — Progress Notes (Signed)
She presents today with a chief complaint of a painful callus sub-fourth metatarsal of the right foot. States that it is extremely painful and prevents her from performing her daily activities.  Objective: Vital signs are stable she is alert and oriented 3. Pulses are palpable. She has a superficial porokeratotic lesion sub-fourth metatarsal head of the right foot.  Assessment: Porokeratosis right foot.  Plan: Debrided reactive hyperkeratotic lesion place salicylic acid under occlusion to be left on for 2-3 days without getting wet she states this off and then washed it thoroughly. I will follow up with her in 6 weeks if necessary otherwise when necessary.

## 2016-05-24 DIAGNOSIS — E119 Type 2 diabetes mellitus without complications: Secondary | ICD-10-CM | POA: Diagnosis not present

## 2016-05-24 DIAGNOSIS — E7800 Pure hypercholesterolemia, unspecified: Secondary | ICD-10-CM | POA: Diagnosis not present

## 2016-06-18 ENCOUNTER — Ambulatory Visit: Payer: BLUE CROSS/BLUE SHIELD | Admitting: Dietician

## 2016-07-04 ENCOUNTER — Encounter: Payer: Self-pay | Admitting: Dietician

## 2016-07-04 ENCOUNTER — Encounter: Payer: BLUE CROSS/BLUE SHIELD | Attending: Family Medicine | Admitting: Dietician

## 2016-07-04 VITALS — Ht 64.5 in | Wt 157.7 lb

## 2016-07-04 DIAGNOSIS — E119 Type 2 diabetes mellitus without complications: Secondary | ICD-10-CM | POA: Diagnosis not present

## 2016-07-04 NOTE — Patient Instructions (Signed)
   Continue with your current diet and eating pattern, you have made some healthy changes that have likely already helped you blood sugar.

## 2016-07-04 NOTE — Progress Notes (Signed)
Medical Nutrition Therapy: Visit start time: 1100  end time: 1200  Assessment:  Diagnosis: Type 2 Diabetes Past medical history: psoriatic arthritis Psychosocial issues/ stress concerns: none, some family stress Preferred learning method:  . Hands-on  Current weight: 157.7lbs with shoes and sweater  Height: 5'4.5" Medications, supplements: reconciled list in medical record.  Progress and evaluation: Recent diagnosis of Type 2 DM due to increase in HbA1C above 6.5%. Pateient states she has been in pre-diabetes state for 1-2 years. She has been making diet changes such as eliminating sweets other than occasional Dove chocolate. She reports weight loss of about 10-11lbs since 03/2016. Now using protein bars with dark chocolate for treats, not daily.    Physical activity: walking 35-40 minutes, 4 times a week (recently increased)  Dietary Intake:  Usual eating pattern includes 2-3 meals and 1-2 snacks per day. Dining out frequency: 5-6 meals per week.  Breakfast: eats at work -- cottage cheese and berries, protein bar, low sugar oatmeal, might start plain cheerios; decaf black coffee Snack: none Lunch: protein bar, nabs, doritos, taco, salad. Quick meal at work.  Snack: occasional snack bar/ protein Supper: pork chop, spaghetti, wrap with chicken, avocado, limits starches, salads Snack: none Beverages: water, stopped 1/2-sweet tea other than occasionally at lunch, diet pepsi (due to no aspartame)  Nutrition Care Education: Topics covered: diabetes Basic nutrition: basic food groups, appropriate nutrient balance, appropriate meal and snack schedule, general nutrition guidelines    Weight control: benefits of weight control Diabetes: appropriate meal and snack schedule, appropriate carb intake and balance, plate method for planning balanced meals   Nutritional Diagnosis:  [place]-2.2 Altered nutrition-related laboratory As related to diabetes.  As evidenced by lab report.  Intervention:  Instruction as noted above.   Patient has made positive changes and is motivated to maintain healthy lifestyle habits.   No follow-up needed at this time. Encouraged patient to call with any questions.   Education Materials given:  . General diet guidelines for Diabetes . Plate Planner with food lists . Sample meal pattern/ menus . Goals/ instructions  Learner/ who was taught:  . Patient   Level of understanding: Marland Kitchen Verbalizes/ demonstrates competency  Demonstrated degree of understanding via:   Teach back Learning barriers: . None  Willingness to learn/ readiness for change: . Eager, change in progress  Monitoring and Evaluation:  Dietary intake, exercise, BG control, and body weight      follow up: prn

## 2016-08-14 ENCOUNTER — Encounter: Payer: Self-pay | Admitting: *Deleted

## 2016-08-16 ENCOUNTER — Ambulatory Visit (INDEPENDENT_AMBULATORY_CARE_PROVIDER_SITE_OTHER): Payer: BLUE CROSS/BLUE SHIELD | Admitting: General Surgery

## 2016-08-16 ENCOUNTER — Encounter: Payer: Self-pay | Admitting: General Surgery

## 2016-08-16 VITALS — BP 120/78 | HR 62 | Resp 12 | Ht 64.0 in | Wt 150.0 lb

## 2016-08-16 DIAGNOSIS — D213 Benign neoplasm of connective and other soft tissue of thorax: Secondary | ICD-10-CM

## 2016-08-16 DIAGNOSIS — D171 Benign lipomatous neoplasm of skin and subcutaneous tissue of trunk: Secondary | ICD-10-CM | POA: Diagnosis not present

## 2016-08-16 NOTE — Patient Instructions (Signed)
May shower May use ice pack as needed for comfort May remove dressing in 2-3 days. Steri strip will gradually come off in 2-3 weeks.

## 2016-08-16 NOTE — Progress Notes (Signed)
Dictation #1 [id number]  [id number] Patient ID: Ashley Rodriguez, female   DOB: April 07, 1951, 66 y.o.   MRN: [id number]  Chief Complaint  Patient presents with  . Procedure    HPI Ashley Rodriguez is a 66 y.o. female.  Here today for evaluation of a lipoma right flank that she would like removed. It has been there a year or more. She has recently lost some weight and noticies it more. She has had several removed in the past.  HPI  Past Medical History:  Diagnosis Date  . Arthritis     Past Surgical History:  Procedure Laterality Date  . ABDOMINAL HYSTERECTOMY  2000  . COLONOSCOPY  2015  . lipoma removed  04/2012   on neck  . NECK MASS EXCISION  04/28/14   lipoma    No family history on file.  Social History Social History  Substance Use Topics  . Smoking status: Never Smoker  . Smokeless tobacco: Never Used  . Alcohol use 2.4 oz/week    4 Standard drinks or equivalent per week    Allergies  Allergen Reactions  . Codeine Rash  . Sulfa Antibiotics Rash    Current Outpatient Prescriptions  Medication Sig Dispense Refill  . acetaminophen (TYLENOL) 325 MG tablet Take 325 mg by mouth every 6 (six) hours as needed.    Marland Kitchen amLODipine (NORVASC) 5 MG tablet Take 5 mg by mouth daily.    Marland Kitchen atenolol (TENORMIN) 100 MG tablet Take 100 mg by mouth daily.    . calcium carbonate (CALCIUM 600) 600 MG TABS tablet Take 600 mg by mouth daily with breakfast.    . etanercept (ENBREL) 50 MG/ML injection Inject 50 mg into the skin once a week.    . fexofenadine (ALLEGRA) 180 MG tablet Take 180 mg by mouth daily.    . fluticasone (FLONASE) 50 MCG/ACT nasal spray   4  . meloxicam (MOBIC) 15 MG tablet Take 15 mg by mouth daily.   2  . montelukast (SINGULAIR) 10 MG tablet   11  . Multiple Vitamins-Minerals (MULTIVITAMIN WITH MINERALS) tablet Take 1 tablet by mouth daily.    . prednisoLONE (ORAPRED ODT) 10 MG disintegrating tablet Take 10 mg by mouth daily.     . valACYclovir (VALTREX) 500 MG  tablet Take 500 mg by mouth as needed.     No current facility-administered medications for this visit.     Review of Systems Review of Systems  Constitutional: Negative.   Respiratory: Negative.   Cardiovascular: Negative.     Blood pressure 120/78, pulse 62, resp. rate 12, height 5\' 4"  (1.626 m), weight 150 lb (68 kg).  Physical Exam Physical Exam  Constitutional: She is oriented to person, place, and time. She appears well-developed and well-nourished.  HENT:  Mouth/Throat: Oropharynx is clear and moist.  Eyes: Conjunctivae are normal. No scleral icterus.  Neck: Neck supple.  Lymphadenopathy:    She has no cervical adenopathy.  Neurological: She is alert and oriented to person, place, and time.  Skin: Skin is dry.   lipoma right flank    Data Reviewed The area was cleaned with alcohol and infiltrated with 20 cc of 0/5 % Xylocaine with 0.25 % marcaine with 1: 200,000 epi.  An additional 4 cc of 1%  Xylocaine plain was added. Skin prep with Chrloraprep. A skin line incision was made. The lipoma extended down to the muscle fascia but did not violate the muscle. No bleeding noted. The deep tissue was approximated with a running 3-0  Vicryl suture. The skin was closed with a 3-0 Vicryl running subcuticular suture. Benzoin, steristrips, telfa and Tegaderm applied.  Assessment     right flank lioma    Plan     Post biopsy wound care reviewed.    Nursing followup in one week if desires.    HPI, Physical Exam, Assessment and Plan have been scribed under the direction and in the presence of Robert Bellow, MD.  Karie Fetch, RN  I have completed the exam and reviewed the above documentation for accuracy and completeness.  I agree with the above.  Haematologist has been used and any errors in dictation or transcription are unintentional.  Hervey Ard, M.D., F.A.C.S.   Robert Bellow 08/17/2016, 8:23 PM

## 2016-08-17 DIAGNOSIS — D171 Benign lipomatous neoplasm of skin and subcutaneous tissue of trunk: Secondary | ICD-10-CM

## 2016-08-17 HISTORY — DX: Benign lipomatous neoplasm of skin and subcutaneous tissue of trunk: D17.1

## 2016-08-22 ENCOUNTER — Telehealth: Payer: Self-pay | Admitting: *Deleted

## 2016-08-22 DIAGNOSIS — E7800 Pure hypercholesterolemia, unspecified: Secondary | ICD-10-CM | POA: Diagnosis not present

## 2016-08-22 DIAGNOSIS — E119 Type 2 diabetes mellitus without complications: Secondary | ICD-10-CM | POA: Diagnosis not present

## 2016-08-22 NOTE — Telephone Encounter (Signed)
-----   Message from Robert Bellow, MD sent at 08/21/2016  4:31 PM EDT ----- Please notify the patient the lab report was fine: just fatty tissue (lipoma) thanks.  ----- Message ----- From: Interface, Lab In [facility] [place] [facility] Sent: 08/20/2016   7:44 PM To: Robert Bellow, MD

## 2016-08-22 NOTE — Telephone Encounter (Signed)
Patient notified as instructed, verbalized understanding. 

## 2016-08-30 DIAGNOSIS — Z85828 Personal history of other malignant neoplasm of skin: Secondary | ICD-10-CM | POA: Diagnosis not present

## 2016-08-30 DIAGNOSIS — Z1283 Encounter for screening for malignant neoplasm of skin: Secondary | ICD-10-CM | POA: Diagnosis not present

## 2016-08-30 DIAGNOSIS — B078 Other viral warts: Secondary | ICD-10-CM | POA: Diagnosis not present

## 2016-08-30 DIAGNOSIS — D1801 Hemangioma of skin and subcutaneous tissue: Secondary | ICD-10-CM | POA: Diagnosis not present

## 2016-08-30 DIAGNOSIS — Z808 Family history of malignant neoplasm of other organs or systems: Secondary | ICD-10-CM | POA: Diagnosis not present

## 2016-08-30 DIAGNOSIS — L570 Actinic keratosis: Secondary | ICD-10-CM | POA: Diagnosis not present

## 2016-10-15 DIAGNOSIS — M2550 Pain in unspecified joint: Secondary | ICD-10-CM | POA: Diagnosis not present

## 2016-10-15 DIAGNOSIS — L409 Psoriasis, unspecified: Secondary | ICD-10-CM | POA: Diagnosis not present

## 2016-10-15 DIAGNOSIS — Z79899 Other long term (current) drug therapy: Secondary | ICD-10-CM | POA: Diagnosis not present

## 2016-10-15 DIAGNOSIS — L4050 Arthropathic psoriasis, unspecified: Secondary | ICD-10-CM | POA: Diagnosis not present

## 2016-12-20 DIAGNOSIS — R42 Dizziness and giddiness: Secondary | ICD-10-CM | POA: Diagnosis not present

## 2016-12-26 DIAGNOSIS — R42 Dizziness and giddiness: Secondary | ICD-10-CM | POA: Diagnosis not present

## 2017-01-03 ENCOUNTER — Encounter: Payer: Self-pay | Admitting: Nurse Practitioner

## 2017-01-03 ENCOUNTER — Ambulatory Visit
Admission: RE | Admit: 2017-01-03 | Discharge: 2017-01-03 | Disposition: A | Discharge status: Home / Self Care | Payer: BLUE CROSS/BLUE SHIELD | Source: Ambulatory Visit | Attending: Nurse Practitioner | Admitting: Nurse Practitioner

## 2017-01-03 ENCOUNTER — Ambulatory Visit: Payer: BLUE CROSS/BLUE SHIELD | Attending: Nurse Practitioner | Admitting: Nurse Practitioner

## 2017-01-03 VITALS — BP 141/77 | HR 60 | Temp 98.0°F | Resp 18 | Ht 64.5 in | Wt 143.5 lb

## 2017-01-03 DIAGNOSIS — M5442 Lumbago with sciatica, left side: Secondary | ICD-10-CM | POA: Insufficient documentation

## 2017-01-03 DIAGNOSIS — I10 Essential (primary) hypertension: Secondary | ICD-10-CM | POA: Diagnosis not present

## 2017-01-03 DIAGNOSIS — F1190 Opioid use, unspecified, uncomplicated: Secondary | ICD-10-CM | POA: Diagnosis not present

## 2017-01-03 DIAGNOSIS — Z885 Allergy status to narcotic agent status: Secondary | ICD-10-CM | POA: Diagnosis not present

## 2017-01-03 DIAGNOSIS — G8929 Other chronic pain: Secondary | ICD-10-CM | POA: Insufficient documentation

## 2017-01-03 DIAGNOSIS — M545 Low back pain: Secondary | ICD-10-CM | POA: Diagnosis not present

## 2017-01-03 DIAGNOSIS — M5136 Other intervertebral disc degeneration, lumbar region: Secondary | ICD-10-CM | POA: Insufficient documentation

## 2017-01-03 DIAGNOSIS — M79604 Pain in right leg: Secondary | ICD-10-CM

## 2017-01-03 DIAGNOSIS — M79605 Pain in left leg: Secondary | ICD-10-CM

## 2017-01-03 DIAGNOSIS — M5441 Lumbago with sciatica, right side: Secondary | ICD-10-CM | POA: Diagnosis not present

## 2017-01-03 DIAGNOSIS — Z79891 Long term (current) use of opiate analgesic: Secondary | ICD-10-CM

## 2017-01-03 NOTE — Patient Instructions (Signed)

## 2017-01-03 NOTE — Progress Notes (Signed)
Safety precautions to be maintained throughout the outpatient stay will include: orient to surroundings, keep bed in low position, maintain call bell within reach at all times, provide assistance with transfer out of bed and ambulation.  

## 2017-01-03 NOTE — Progress Notes (Addendum)
Patient's Name: Ashley Rodriguez  MRN: [id number]  Referring Provider: Corinne Ports, AUD  DOB: 05-12-1950  PCP: Gaynelle Arabian, MD  DOS: 01/03/2017  Note by: Dionisio David NP  Service setting: Ambulatory outpatient  Specialty: Interventional Pain Management  Location: [facility] (AMB) Pain Management Facility    Patient type: New Patient    Primary Reason(s) for Visit: Initial Patient Evaluation CC: Back Pain (low); Leg Pain (right worse than left); and Sciatica (bilateral)  HPI  [name] is a 66 y.o. year old, female patient, who comes today for an initial evaluation. She has Neck mass; Psoriatic arthritis ([place]); Flank lipoma; Chronic low back pain (Primary Area of Pain) (R>L); Chronic pain of both lower extremities (Secondary Area of Pain) (R>L); Long term current use of opiate analgesic; Long term prescription opiate use; and Opiate use on her problem list.. Her primarily concern today is the Back Pain (low); Leg Pain (right worse than left); and Sciatica (bilateral)  Pain Assessment: Location: Lower Back Radiating: sciatica Onset: More than a month ago Duration: Chronic pain Quality: Squeezing, Aching (pulling) Severity: 5 /10 (self-reported pain score)  Note: Reported level is compatible with observation.                   Effect on ADL:   Timing: Constant Modifying factors: rest, heat, medications, hot shower  Onset and Duration: Present longer than 3 months Cause of pain: arthritis Severity: Getting better, Getting worse, NAS-11 at its worse: 9/10, NAS-11 at its best: 4/10, NAS-11 now: 4/10 and NAS-11 on the average: 7/10 Timing: Morning Aggravating Factors: Prolonged sitting, Prolonged standing, Walking and Walking uphill Alleviating Factors: Stretching, Hot packs, Medications and Warm showers or baths Associated Problems: Numbness, Tingling and Weakness Quality of Pain: Agonizing, Annoying, Exhausting, Nagging, Pulsating, Tingling and Uncomfortable Previous Examinations or Tests:  X-rays Previous Treatments: Steroid treatments by mouth and Stretching exercises  The patient comes into the clinics today for the first time for a chronic pain management evaluation. According to the patient her primary area of pain is in her lower back. She admits that the right side is greater than the left. She admits that the pain radiates down into her buttocks. She denies any previous surgeries, interventional therapy, physical therapy. She did have recent images.  Her second area of pain is in her lower extremities with the right being greater than the left. She has some numbness and tingling; that is worse with standing. She does have occasional weakness. She denies any recent falls or injuries. She denies any previous surgeries, interventional therapies or physical therapy. She admits that she does try to walk to help with pain.  Today I took the time to provide the patient with information regarding this pain practice. The patient was informed that the practice is divided into two sections: an interventional pain management section, as well as a completely separate and distinct medication management section. I explained that there are procedure days for interventional therapies, and evaluation days for follow-ups and medication management. Because of the amount of documentation required during both, they are kept separated. This means that there is the possibility that she may be scheduled for a procedure on one day, and medication management the next. I have also informed her that because of staffing and facility limitations, this practice will no longer take patients for medication management only. To illustrate the reasons for this, I gave the patient the example of surgeons, and how inappropriate it would be to refer a patient to  his/her care, just to write for the post-surgical antibiotics on a surgery done by a different surgeon.   Because interventional pain management is part of the  board-certified specialty for the doctors, the patient was informed that joining this practice means that they are open to any and all interventional therapies. I made it clear that this does not mean that they will be forced to have any procedures done. What this means is that I believe interventional therapies to be essential part of the diagnosis and proper management of chronic pain conditions. Therefore, patients not interested in these interventional alternatives will be better served under the care of a different practitioner.  The patient was also made aware of my Comprehensive Pain Management Safety Guidelines where by joining this practice, they limit all of their nerve blocks and joint injections to those done by our practice, for as long as we are retained to manage their care. Historic Controlled Substance Pharmacotherapy Review  PMP and historical list of controlled substances:tramadol 50 mg, hydrocodone/Chlorphen ER, hydrocodone/acetaminophen 5/325 mg Highest opioid analgesic regimen found: tramadol 50 mg 2 tablets every 6 hours(fill date 04/28/2014) tramadol 400 mg per day Most recent opioid analgesic: none Current opioid analgesics: none Highest recorded MME/day: '40mg'$ /day MME/day:0 mg/day Medications: The patient did not bring the medication(s) to the appointment, as requested in our "New Patient Package" Pharmacodynamics: Desired effects: Analgesia: The patient reports >50% benefit. Reported improvement in function: The patient reports medication allows her to accomplish basic ADLs. Clinically meaningful improvement in function (CMIF): Sustained CMIF goals met Perceived effectiveness: Described as relatively effective, allowing for increase in activities of daily living (ADL) Undesirable effects: Side-effects or Adverse reactions: None reported Historical Monitoring: The patient  reports that she does not use drugs. List of all UDS Test(s): No results found for: MDMA,  COCAINSCRNUR, PCPSCRNUR, PCPQUANT, CANNABQUANT, THCU, [place] List of all Serum Drug Screening Test(s):  No results found for: AMPHSCRSER, BARBSCRSER, BENZOSCRSER, COCAINSCRSER, PCPSCRSER, PCPQUANT, THCSCRSER, CANNABQUANT, OPIATESCRSER, OXYSCRSER, PROPOXSCRSER Historical Background Evaluation: [place] PDMP: Six (6) year initial data search conducted.             [place] Department of public safety, offender search: Editor, commissioning Information) Non-contributory Risk Assessment Profile: Aberrant behavior: None observed or detected today Risk factors for fatal opioid overdose: None identified today Fatal overdose hazard ratio (HR): Calculation deferred Non-fatal overdose hazard ratio (HR): Calculation deferred Risk of opioid abuse or dependence: 0.7-3.0% with doses ? 36 MME/day and 6.1-26% with doses ? 120 MME/day. Substance use disorder (SUD) risk level: Pending results of Medical Psychology Evaluation for SUD Opioid risk tool (ORT) (Total Score): 0  ORT Scoring interpretation table:  Score <3 = Low Risk for SUD  Score between 4-7 = Moderate Risk for SUD  Score >8 = High Risk for Opioid Abuse   PHQ-2 Depression Scale:  Total score: 0  PHQ-2 Scoring interpretation table: (Score and probability of major depressive disorder)  Score 0 = No depression  Score 1 = 15.4% Probability  Score 2 = 21.1% Probability  Score 3 = 38.4% Probability  Score 4 = 45.5% Probability  Score 5 = 56.4% Probability  Score 6 = 78.6% Probability   PHQ-9 Depression Scale:  Total score: 0  PHQ-9 Scoring interpretation table:  Score 0-4 = No depression  Score 5-9 = Mild depression  Score 10-14 = Moderate depression  Score 15-19 = Moderately severe depression  Score 20-27 = Severe depression (2.4 times higher risk of SUD and 2.89 times higher risk of overuse)  Pharmacologic Plan: Pending ordered tests and/or consults  Meds  The patient has a current medication list which includes the following prescription(s): acetaminophen,  amlodipine, atenolol, calcium carbonate, etanercept, fexofenadine, fluticasone, meloxicam, montelukast, multivitamin with minerals, prednisone, and valacyclovir.  Current Outpatient Prescriptions on File Prior to Visit  Medication Sig  . acetaminophen (TYLENOL) 325 MG tablet Take 325 mg by mouth every 6 (six) hours as needed.  Marland Kitchen amLODipine (NORVASC) 5 MG tablet Take 5 mg by mouth daily.  Marland Kitchen atenolol (TENORMIN) 100 MG tablet Take 100 mg by mouth daily.  . calcium carbonate (CALCIUM 600) 600 MG TABS tablet Take 600 mg by mouth daily with breakfast.  . etanercept (ENBREL) 50 MG/ML injection Inject 50 mg into the skin once a week.  . fexofenadine (ALLEGRA) 180 MG tablet Take 180 mg by mouth daily.  . fluticasone (FLONASE) 50 MCG/ACT nasal spray   . meloxicam (MOBIC) 15 MG tablet Take 15 mg by mouth daily.   . montelukast (SINGULAIR) 10 MG tablet   . Multiple Vitamins-Minerals (MULTIVITAMIN WITH MINERALS) tablet Take 1 tablet by mouth daily.  . valACYclovir (VALTREX) 500 MG tablet Take 500 mg by mouth as needed.   No current facility-administered medications on file prior to visit.    Imaging Review    Note: No results found under the [facility] record.   Care everywhere  ROS  Cardiovascular History: High blood pressure Pulmonary or Respiratory History: Snoring  Neurological History: No reported neurological signs or symptoms such as seizures, abnormal skin sensations, urinary and/or fecal incontinence, being born with an abnormal open spine and/or a tethered spinal cord Review of Past Neurological Studies: No results found for this or any previous visit. Psychological-Psychiatric History: No reported psychological or psychiatric signs or symptoms such as difficulty sleeping, anxiety, depression, delusions or hallucinations (schizophrenial), mood swings (bipolar disorders) or suicidal ideations or attempts Gastrointestinal History: Reflux or heatburn Genitourinary  History: Passing kidney stones and No reported renal or genitourinary signs or symptoms such as difficulty voiding or producing urine, peeing blood, non-functioning kidney, kidney stones, difficulty emptying the bladder, difficulty controlling the flow of urine, or chronic kidney disease Hematological History: Brusing easily Endocrine History: High blood sugar controlled without the use of insulin (NIDDM) Rheumatologic History: No reported rheumatological signs and symptoms such as fatigue, joint pain, tenderness, swelling, redness, heat, stiffness, decreased range of motion, with or without associated rash Musculoskeletal History: Negative for myasthenia gravis, muscular dystrophy, multiple sclerosis or malignant hyperthermia Work History: Working full time  Allergies  Ms. Vary is allergic to codeine and sulfa antibiotics.  Laboratory Chemistry  Inflammation Markers Lab Results  Component Value Date   CRP 0.8 01/03/2017   ESRSEDRATE 6 01/03/2017   (CRP: Acute Phase) (ESR: Chronic Phase) Renal Function Markers Lab Results  Component Value Date   BUN 9 01/03/2017   CREATININE 0.80 01/03/2017   GFRAA 90 01/03/2017   GFRNONAA 78 01/03/2017   Hepatic Function Markers Lab Results  Component Value Date   AST 28 01/03/2017   ALT 24 01/03/2017   ALBUMIN 4.8 01/03/2017   ALKPHOS 55 01/03/2017   Electrolytes Lab Results  Component Value Date   NA 140 01/03/2017   K 4.6 01/03/2017   CL 98 01/03/2017   CALCIUM 10.0 01/03/2017   MG 2.1 01/03/2017   Neuropathy Markers Lab Results  Component Value Date   VITAMINB12 740 01/03/2017   Bone Pathology Markers Lab Results  Component Value Date   ALKPHOS 55 01/03/2017   25OHVITD1  WILL FOLLOW 01/03/2017   25OHVITD2 WILL FOLLOW 01/03/2017   25OHVITD3 WILL FOLLOW 01/03/2017   CALCIUM 10.0 01/03/2017   Coagulation Parameters No results found for: INR, LABPROT, APTT, PLT Cardiovascular Markers No results found for: BNP, HGB,  HCT Note: Lab results reviewed.  PFSH  Drug: Ms. Drollinger  reports that she does not use drugs. Alcohol:  reports that she drinks about 2.4 oz of alcohol per week . Tobacco:  reports that she has never smoked. She has never used smokeless tobacco. Medical:  has a past medical history of Arthritis and Hypertension. Family: family history is not on file.  Past Surgical History:  Procedure Laterality Date  . ABDOMINAL HYSTERECTOMY  2000  . COLONOSCOPY  2015  . lipoma removed  04/2012   on neck  . NECK MASS EXCISION  04/28/14   lipoma   Active Ambulatory Problems    Diagnosis Date Noted  . Neck mass 04/07/2013  . Psoriatic arthritis ([facility]) 03/04/2013  . Flank lipoma 08/17/2016  . Chronic low back pain (Primary Area of Pain) (R>L) 01/03/2017  . Chronic pain of both lower extremities (Secondary Area of Pain) (R>L) 01/03/2017  . Long term current use of opiate analgesic 01/03/2017  . Long term prescription opiate use 01/03/2017  . Opiate use 01/03/2017   Resolved Ambulatory Problems    Diagnosis Date Noted  . No Resolved Ambulatory Problems   Past Medical History:  Diagnosis Date  . Arthritis   . Hypertension    Constitutional Exam  General appearance: Well nourished, well developed, and well hydrated. In no apparent acute distress Vitals:   01/03/17 1333  BP: (!) 141/77  Pulse: 60  Resp: 18  Temp: 98 F (36.7 C)  TempSrc: Oral  SpO2: 98%  Weight: 143 lb 8 oz (65.1 kg)  Height: 5' 4.5" (1.638 m)   BMI Assessment: Estimated body mass index is 24.25 kg/m as calculated from the following:   Height as of this encounter: 5' 4.5" (1.638 m).   Weight as of this encounter: 143 lb 8 oz (65.1 kg).  BMI interpretation table: BMI level Category Range association with higher incidence of chronic pain  <18 kg/m2 Underweight   18.5-24.9 kg/m2 Ideal body weight   25-29.9 kg/m2 Overweight Increased incidence by 20%  30-34.9 kg/m2 Obese (Class I) Increased incidence by 68%  35-39.9  kg/m2 Severe obesity (Class II) Increased incidence by 136%  >40 kg/m2 Extreme obesity (Class III) Increased incidence by 254%   BMI Readings from Last 4 Encounters:  01/03/17 24.25 kg/m  08/16/16 25.75 kg/m  07/04/16 26.65 kg/m  08/05/14 28.84 kg/m   Wt Readings from Last 4 Encounters:  01/03/17 143 lb 8 oz (65.1 kg)  08/16/16 150 lb (68 kg)  07/04/16 157 lb 11.2 oz (71.5 kg)  08/05/14 168 lb (76.2 kg)  Psych/Mental status: Alert, oriented x 3 (person, place, & time)       Eyes: PERLA Respiratory: No evidence of acute respiratory distress  Cervical Spine Exam  Inspection: No masses, redness, or swelling Alignment: Symmetrical Functional ROM: Unrestricted ROM      Stability: No instability detected Muscle strength & Tone: Functionally intact Sensory: Unimpaired Palpation: No palpable anomalies              Upper Extremity (UE) Exam    Side: Right upper extremity  Side: Left upper extremity  Inspection: No masses, redness, swelling, or asymmetry. No contractures  Inspection: No masses, redness, swelling, or asymmetry. No contractures  Functional ROM: Unrestricted ROM  Functional ROM: Unrestricted ROM          Muscle strength & Tone: Functionally intact  Muscle strength & Tone: Functionally intact  Sensory: Unimpaired  Sensory: Unimpaired  Palpation: No palpable anomalies              Palpation: No palpable anomalies              Specialized Test(s): Deferred         Specialized Test(s): Deferred          Thoracic Spine Exam  Inspection: No masses, redness, or swelling Alignment: Symmetrical Functional ROM: Unrestricted ROM Stability: No instability detected Sensory: Unimpaired Muscle strength & Tone: No palpable anomalies  Lumbar Spine Exam  Inspection: No masses, redness, or swelling Alignment: Symmetrical Functional ROM: Unrestricted ROM      Stability: No instability detected Muscle strength & Tone: Functionally intact Sensory: Unimpaired Palpation:  Complains of area being tender to palpation       Provocative Tests: Lumbar Hyperextension and rotation test: Positive bilaterally for facet joint pain. Patrick's Maneuver: Negative                    Gait & Posture Assessment  Ambulation: Unassisted Gait: Relatively normal for age and body habitus Posture: WNL   Lower Extremity Exam    Side: Right lower extremity  Side: Left lower extremity  Inspection: No masses, redness, swelling, or asymmetry. No contractures  Inspection: No masses, redness, swelling, or asymmetry. No contractures  Functional ROM: Unrestricted ROM          Functional ROM: Unrestricted ROM          Muscle strength & Tone: Functionally intact  Muscle strength & Tone: Functionally intact  Sensory: Unimpaired  Sensory: Unimpaired  Palpation: No palpable anomalies  Palpation: No palpable anomalies   Assessment  Primary Diagnosis & Pertinent Problem List: The primary encounter diagnosis was Chronic bilateral low back pain, with sciatica presence unspecified. Diagnoses of Chronic pain of both lower extremities, Long term current use of opiate analgesic, Long term prescription opiate use, and Opiate use were also pertinent to this visit.  Visit Diagnosis: 1. Chronic bilateral low back pain, with sciatica presence unspecified   2. Chronic pain of both lower extremities   3. Long term current use of opiate analgesic   4. Long term prescription opiate use   5. Opiate use    Plan of Care  Initial treatment plan:  Please be advised that as per protocol, today's visit has been an evaluation only. We have not taken over the patient's controlled substance management.  Problem-specific plan: No problem-specific Assessment & Plan notes found for this encounter.  Ordered Lab-work, Procedure(s), Referral(s), & Consult(s): Orders Placed This Encounter  Procedures  . DG Lumb Spine Flex&Ext Only  . Comprehensive metabolic panel  . C-reactive protein  . Sedimentation rate  .  Magnesium  . 25-Hydroxyvitamin D Lcms D2+D3  . Vitamin B12   Pharmacotherapy: Medications ordered:  No orders of the defined types were placed in this encounter.  Medications administered during this visit: Ms. Chandonnet had no medications administered during this visit.   Pharmacotherapy under consideration:  Opioid Analgesics: The patient was informed that there is no guarantee that she would be a candidate for opioid analgesics. The decision will be made following CDC guidelines. This decision will be based on the results of diagnostic studies, as well as Ms. Kuc's risk profile.  Membrane stabilizer: To be determined at a later time  Muscle relaxant: To be determined at a later time NSAID: To be determined at a later time Other analgesic(s): To be determined at a later time   Interventional therapies under consideration: Ms. Richeson was informed that there is no guarantee that she would be a candidate for interventional therapies. The decision will be based on the results of diagnostic studies, as well as Ms. Behrendt's risk profile.  Possible procedure(s): Diagnostic bilateral lumbar ESI Diagnostic bilateral lumbar facet block Possible Lumbar facet RFA   Provider-requested follow-up: Return for 2nd Visit, w/ Dr. Dossie Arbour.  Future Appointments Date Time Provider [place]  01/16/2017 7:45 AM Milinda Pointer, MD [facility] None    Primary Care Physician: Gaynelle Arabian, MD Location: [facility] Outpatient Pain Management Facility Note by:  Date: 01/03/2017; Time: 1:56 PM  Pain Score Disclaimer: We use the NRS-11 scale. This is a self-reported, subjective measurement of pain severity with only modest accuracy. It is used primarily to identify changes within a particular patient. It must be understood that outpatient pain scales are significantly less accurate that those used for research, where they can be applied under ideal controlled circumstances with minimal exposure to  variables. In reality, the score is likely to be a combination of pain intensity and pain affect, where pain affect describes the degree of emotional arousal or changes in action readiness caused by the sensory experience of pain. Factors such as social and work situation, setting, emotional state, anxiety levels, expectation, and prior pain experience may influence pain perception and show large inter-individual differences that may also be affected by time variables.  Patient instructions provided during this appointment: Patient Instructions    ____________________________________________________________________________________________  Appointment Policy Summary  It is our goal and responsibility to provide the medical community with assistance in the evaluation and management of patients with chronic pain. Unfortunately our resources are limited. Because we do not have an unlimited amount of time, or available appointments, we are required to closely monitor and manage their use. The following rules exist to maximize their use:  Patient's responsibilities: 1. Punctuality:  At what time should I arrive? You should be physically present in our office 30 minutes before your scheduled appointment. Your scheduled appointment is with your assigned healthcare provider. However, it takes 5-10 minutes to be "checked-in", and another 15 minutes for the nurses to do the admission. If you arrive to our office at the time you were given for your appointment, you will end up being at least 20-25 minutes late to your appointment with the provider. 2. Tardiness:  What happens if I arrive only a few minutes after my scheduled appointment time? You will need to reschedule your appointment. The cutoff is your appointment time. This is why it is so important that you arrive at least 30 minutes before that appointment. If you have an appointment scheduled for 10:00 AM and you arrive at 10:01, you will be required to  reschedule your appointment.  3. Plan ahead:  Always assume that you will encounter traffic on your way in. Plan for it. If you are dependent on a driver, make sure they understand these rules and the need to arrive early. 4. Other appointments and responsibilities:  Avoid scheduling any other appointments before or after your pain clinic appointments.  5. Be prepared:  Write down everything that you need to discuss with your healthcare provider and give this information to the admitting nurse. Write down the medications that you will need refilled. Bring your pills and bottles (even the  empty ones), to all of your appointments, except for those where a procedure is scheduled. 6. No children or pets:  Find someone to take care of them. It is not appropriate to bring them in. 7. Scheduling changes:  We request "advanced notification" of any changes or cancellations. 8. Advanced notification:  Defined as a time period of more than 24 hours prior to the originally scheduled appointment. This allows for the appointment to be offered to other patients. 9. Rescheduling:  When a visit is rescheduled, it will require the cancellation of the original appointment. For this reason they both fall within the category of "Cancellations".  10. Cancellations:  They require advanced notification. Any cancellation less than 24 hours before the  appointment will be recorded as a "No Show". 11. No Show:  Defined as an unkept appointment where the patient failed to notify or declare to the practice their intention or inability to keep the appointment.  Corrective process for repeat offenders:  1. Tardiness: Three (3) episodes of rescheduling due to late arrivals will be recorded as one (1) "No Show". 2. Cancellation or reschedule: Three (3) cancellations or rescheduling will be recorded as one (1) "No Show". 3. "No Shows": Three (3) "No Shows" within a 12 month period will result in discharge from the  practice.  ____________________________________________________________________________________________  ____________________________________________________________________________________________  Pain Scale  Introduction: The pain score used by this practice is the Verbal Numerical Rating Scale (VNRS-11). This is an 11-point scale. It is for adults and children 10 years or older. There are significant differences in how the pain score is reported, used, and applied. Forget everything you learned in the past and learn this scoring system.  General Information: The scale should reflect your current level of pain. Unless you are specifically asked for the level of your worst pain, or your average pain. If you are asked for one of these two, then it should be understood that it is over the past 24 hours.  Basic Activities of Daily Living (ADL): Personal hygiene, dressing, eating, transferring, and using restroom.  Instructions: Most patients tend to report their level of pain as a combination of two factors, their physical pain and their psychosocial pain. This last one is also known as "suffering" and it is reflection of how physical pain affects you socially and psychologically. From now on, report them separately. From this point on, when asked to report your pain level, report only your physical pain. Use the following table for reference.  Pain Clinic Pain Levels (0-5/10)  Pain Level Score  Description  No Pain 0   Mild pain 1 Nagging, annoying, but does not interfere with basic activities of daily living (ADL). Patients are able to eat, bathe, get dressed, toileting (being able to get on and off the toilet and perform personal hygiene functions), transfer (move in and out of bed or a chair without assistance), and maintain continence (able to control bladder and bowel functions). Blood pressure and heart rate are unaffected. A normal heart rate for a healthy adult ranges from 60 to 100 bpm  (beats per minute).   Mild to moderate pain 2 Noticeable and distracting. Impossible to hide from other people. More frequent flare-ups. Still possible to adapt and function close to normal. It can be very annoying and may have occasional stronger flare-ups. With discipline, patients may get used to it and adapt.   Moderate pain 3 Interferes significantly with activities of daily living (ADL). It becomes difficult to feed, bathe, get dressed,  get on and off the toilet or to perform personal hygiene functions. Difficult to get in and out of bed or a chair without assistance. Very distracting. With effort, it can be ignored when deeply involved in activities.   Moderately severe pain 4 Impossible to ignore for more than a few minutes. With effort, patients may still be able to manage work or participate in some social activities. Very difficult to concentrate. Signs of autonomic nervous system discharge are evident: dilated pupils (mydriasis); mild sweating (diaphoresis); sleep interference. Heart rate becomes elevated (>115 bpm). Diastolic blood pressure (lower number) rises above 100 mmHg. Patients find relief in laying down and not moving.   Severe pain 5 Intense and extremely unpleasant. Associated with frowning face and frequent crying. Pain overwhelms the senses.  Ability to do any activity or maintain social relationships becomes significantly limited. Conversation becomes difficult. Pacing back and forth is common, as getting into a comfortable position is nearly impossible. Pain wakes you up from deep sleep. Physical signs will be obvious: pupillary dilation; increased sweating; goosebumps; brisk reflexes; cold, clammy hands and feet; nausea, vomiting or dry heaves; loss of appetite; significant sleep disturbance with inability to fall asleep or to remain asleep. When persistent, significant weight loss is observed due to the complete loss of appetite and sleep deprivation.  Blood pressure and heart  rate becomes significantly elevated. Caution: If elevated blood pressure triggers a pounding headache associated with blurred vision, then the patient should immediately seek attention at an urgent or emergency care unit, as these may be signs of an impending stroke.    Emergency Department Pain Levels (6-10/10)  Emergency Room Pain 6 Severely limiting. Requires emergency care and should not be seen or managed at an outpatient pain management facility. Communication becomes difficult and requires great effort. Assistance to reach the emergency department may be required. Facial flushing and profuse sweating along with potentially dangerous increases in heart rate and blood pressure will be evident.   Distressing pain 7 Self-care is very difficult. Assistance is required to transport, or use restroom. Assistance to reach the emergency department will be required. Tasks requiring coordination, such as bathing and getting dressed become very difficult.   Disabling pain 8 Self-care is no longer possible. At this level, pain is disabling. The individual is unable to do even the most "basic" activities such as walking, eating, bathing, dressing, transferring to a bed, or toileting. Fine motor skills are lost. It is difficult to think clearly.   Incapacitating pain 9 Pain becomes incapacitating. Thought processing is no longer possible. Difficult to remember your own name. Control of movement and coordination are lost.   The worst pain imaginable 10 At this level, most patients pass out from pain. When this level is reached, collapse of the autonomic nervous system occurs, leading to a sudden drop in blood pressure and heart rate. This in turn results in a temporary and dramatic drop in blood flow to the brain, leading to a loss of consciousness. Fainting is one of the body's self defense mechanisms. Passing out puts the brain in a calmed state and causes it to shut down for a while, in order to begin the  healing process.    Summary: 1. Refer to this scale when providing [name] with your pain level. 2. Be accurate and careful when reporting your pain level. This will help with your care. 3. Over-reporting your pain level will lead to loss of credibility. 4. Even a level of 1/10 means that there  is pain and will be treated at our facility. 5. High, inaccurate reporting will be documented as "Symptom Exaggeration", leading to loss of credibility and suspicions of possible secondary gains such as obtaining more narcotics, or wanting to appear disabled, for fraudulent reasons. 6. Only pain levels of 5 or below will be seen at our facility. 7. Pain levels of 6 and above will be sent to the Emergency Department and the appointment cancelled. ____________________________________________________________________________________________

## 2017-01-07 LAB — SEDIMENTATION RATE: SED RATE: 6 mm/h (ref 0–40)

## 2017-01-07 LAB — COMPREHENSIVE METABOLIC PANEL
ALT: 24 IU/L (ref 0–32)
AST: 28 IU/L (ref 0–40)
Albumin/Globulin Ratio: 2.2 (ref 1.2–2.2)
Albumin: 4.8 g/dL (ref 3.6–4.8)
Alkaline Phosphatase: 55 IU/L (ref 39–117)
BUN/Creatinine Ratio: 11 — ABNORMAL LOW (ref 12–28)
BUN: 9 mg/dL (ref 8–27)
Bilirubin Total: 0.3 mg/dL (ref 0.0–1.2)
CALCIUM: 10 mg/dL (ref 8.7–10.3)
CO2: 24 mmol/L (ref 20–29)
CREATININE: 0.8 mg/dL (ref 0.57–1.00)
Chloride: 98 mmol/L (ref 96–106)
GFR calc Af Amer: 90 mL/min/{1.73_m2} (ref >59)
GFR, EST NON AFRICAN AMERICAN: 78 mL/min/{1.73_m2} (ref >59)
GLUCOSE: 124 mg/dL — AB (ref 65–99)
Globulin, Total: 2.2 g/dL (ref 1.5–4.5)
Potassium: 4.6 mmol/L (ref 3.5–5.2)
Sodium: 140 mmol/L (ref 134–144)
Total Protein: 7 g/dL (ref 6.0–8.5)

## 2017-01-07 LAB — C-REACTIVE PROTEIN: CRP: 0.8 mg/L (ref 0.0–4.9)

## 2017-01-07 LAB — 25-HYDROXY VITAMIN D LCMS D2+D3: 25-Hydroxy, Vitamin D: 50 ng/mL

## 2017-01-07 LAB — VITAMIN B12: Vitamin B-12: 740 pg/mL (ref 232–1245)

## 2017-01-07 LAB — 25-HYDROXYVITAMIN D LCMS D2+D3: 25-HYDROXY, VITAMIN D-3: 50 ng/mL

## 2017-01-07 LAB — MAGNESIUM: MAGNESIUM: 2.1 mg/dL (ref 1.6–2.3)

## 2017-01-08 NOTE — Progress Notes (Signed)
Results were reviewed and found to be: abnormal  Further testing may be useful  Review would suggest interventional pain management techniques may be of benefit

## 2017-01-16 ENCOUNTER — Ambulatory Visit: Payer: BLUE CROSS/BLUE SHIELD | Attending: Pain Medicine | Admitting: Pain Medicine

## 2017-01-16 ENCOUNTER — Encounter: Payer: Self-pay | Admitting: Pain Medicine

## 2017-01-16 VITALS — BP 134/95 | HR 70 | Temp 97.8°F | Resp 16 | Ht 64.0 in | Wt 144.0 lb

## 2017-01-16 DIAGNOSIS — R200 Anesthesia of skin: Secondary | ICD-10-CM

## 2017-01-16 DIAGNOSIS — I10 Essential (primary) hypertension: Secondary | ICD-10-CM | POA: Diagnosis not present

## 2017-01-16 DIAGNOSIS — M5136 Other intervertebral disc degeneration, lumbar region: Secondary | ICD-10-CM | POA: Insufficient documentation

## 2017-01-16 DIAGNOSIS — Z79899 Other long term (current) drug therapy: Secondary | ICD-10-CM | POA: Diagnosis not present

## 2017-01-16 DIAGNOSIS — M51369 Other intervertebral disc degeneration, lumbar region without mention of lumbar back pain or lower extremity pain: Secondary | ICD-10-CM

## 2017-01-16 DIAGNOSIS — G894 Chronic pain syndrome: Secondary | ICD-10-CM | POA: Diagnosis not present

## 2017-01-16 DIAGNOSIS — M488X6 Other specified spondylopathies, lumbar region: Secondary | ICD-10-CM | POA: Insufficient documentation

## 2017-01-16 DIAGNOSIS — M4317 Spondylolisthesis, lumbosacral region: Secondary | ICD-10-CM | POA: Diagnosis not present

## 2017-01-16 DIAGNOSIS — Z7951 Long term (current) use of inhaled steroids: Secondary | ICD-10-CM | POA: Insufficient documentation

## 2017-01-16 DIAGNOSIS — M4300 Spondylolysis, site unspecified: Secondary | ICD-10-CM | POA: Insufficient documentation

## 2017-01-16 DIAGNOSIS — M4310 Spondylolisthesis, site unspecified: Secondary | ICD-10-CM

## 2017-01-16 DIAGNOSIS — R202 Paresthesia of skin: Secondary | ICD-10-CM

## 2017-01-16 DIAGNOSIS — M5441 Lumbago with sciatica, right side: Secondary | ICD-10-CM

## 2017-01-16 DIAGNOSIS — M899 Disorder of bone, unspecified: Secondary | ICD-10-CM | POA: Insufficient documentation

## 2017-01-16 DIAGNOSIS — M545 Low back pain: Secondary | ICD-10-CM | POA: Diagnosis present

## 2017-01-16 DIAGNOSIS — M5442 Lumbago with sciatica, left side: Secondary | ICD-10-CM

## 2017-01-16 DIAGNOSIS — L4050 Arthropathic psoriasis, unspecified: Secondary | ICD-10-CM | POA: Diagnosis not present

## 2017-01-16 DIAGNOSIS — M791 Myalgia: Secondary | ICD-10-CM

## 2017-01-16 DIAGNOSIS — M79604 Pain in right leg: Secondary | ICD-10-CM | POA: Diagnosis not present

## 2017-01-16 DIAGNOSIS — Z791 Long term (current) use of non-steroidal anti-inflammatories (NSAID): Secondary | ICD-10-CM

## 2017-01-16 DIAGNOSIS — M533 Sacrococcygeal disorders, not elsewhere classified: Secondary | ICD-10-CM | POA: Diagnosis not present

## 2017-01-16 DIAGNOSIS — G8929 Other chronic pain: Secondary | ICD-10-CM

## 2017-01-16 DIAGNOSIS — Z882 Allergy status to sulfonamides status: Secondary | ICD-10-CM | POA: Diagnosis not present

## 2017-01-16 DIAGNOSIS — Z789 Other specified health status: Secondary | ICD-10-CM | POA: Diagnosis not present

## 2017-01-16 DIAGNOSIS — Z885 Allergy status to narcotic agent status: Secondary | ICD-10-CM | POA: Insufficient documentation

## 2017-01-16 DIAGNOSIS — M4696 Unspecified inflammatory spondylopathy, lumbar region: Secondary | ICD-10-CM | POA: Diagnosis not present

## 2017-01-16 DIAGNOSIS — M79605 Pain in left leg: Secondary | ICD-10-CM | POA: Insufficient documentation

## 2017-01-16 DIAGNOSIS — M7918 Myalgia, other site: Secondary | ICD-10-CM | POA: Insufficient documentation

## 2017-01-16 DIAGNOSIS — M792 Neuralgia and neuritis, unspecified: Secondary | ICD-10-CM

## 2017-01-16 DIAGNOSIS — M47816 Spondylosis without myelopathy or radiculopathy, lumbar region: Secondary | ICD-10-CM

## 2017-01-16 MED ORDER — KETOROLAC TROMETHAMINE 60 MG/2ML IM SOLN
60.0000 mg | Freq: Once | INTRAMUSCULAR | Status: AC
  Administered 2017-01-16: 60 mg via INTRAMUSCULAR

## 2017-01-16 MED ORDER — ORPHENADRINE CITRATE 30 MG/ML IJ SOLN
INTRAMUSCULAR | Status: AC
  Filled 2017-01-16: qty 2

## 2017-01-16 MED ORDER — KETOROLAC TROMETHAMINE 60 MG/2ML IM SOLN
INTRAMUSCULAR | Status: AC
  Filled 2017-01-16: qty 2

## 2017-01-16 MED ORDER — ORPHENADRINE CITRATE 30 MG/ML IJ SOLN
30.0000 mg | Freq: Once | INTRAMUSCULAR | Status: AC
  Administered 2017-01-16: 30 mg via INTRAMUSCULAR

## 2017-01-16 NOTE — Progress Notes (Signed)
Patient's Name: Ashley Rodriguez  MRN: [id number]  Referring Provider: Gaynelle Arabian, MD  DOB: [birth year]  PCP: Gaynelle Arabian, MD  DOS: 01/16/2017  Note by: Gaspar Cola, MD  Service setting: Ambulatory outpatient  Specialty: Interventional Pain Management  Location: [facility] (AMB) Pain Management Facility    Patient type: Established   Primary Reason(s) for Visit: Encounter for evaluation before starting new chronic pain management plan of care (Level of risk: moderate) CC: Back Pain (low)  HPI  Ashley Rodriguez is a 66 y.o. year old, female patient, who comes today for a follow-up evaluation to review the test results and decide on a treatment plan. She has Psoriatic arthritis ([place]); Chronic low back pain (Primary Area of Pain) (Bilateral) (R>L); Chronic lower extremity pain (Secondary Area of Pain) (Bilateral) (R>L); Grade 1 Anterolisthesis of L5 over S1; L5 pars defect with spondylolisthesis (Bilateral); NSAID long-term use; Disorder of skeletal system; Pharmacologic therapy; Problems influencing health status; DDD (degenerative disc disease), lumbar; Lumbar facet syndrome (Bilateral) (R>L); Numbness and tingling (lower extremities) (Bilateral); Chronic pain syndrome; Chronic musculoskeletal pain; Neurogenic pain; and Chronic sacroiliac joint pain (Bilateral) (R>L) on her problem list. Her primarily concern today is the Back Pain (low)  Pain Assessment: Location: Lower Back Radiating: radiates down both legs to feet, including all foot areas tingling Onset: More than a month ago Duration: Chronic pain Quality: Aching, Squeezing Severity: 4 /10 (self-reported pain score)  Note: Reported level is compatible with observation.                   Timing: Constant Modifying factors: rest, heat, meds  Ashley Rodriguez comes in today for a follow-up visit after her initial evaluation on 01/03/2017. Today we went over the results of her tests. These were explained in "Layman's terms". During today's appointment  we went over my diagnostic impression, as well as the proposed treatment plan.  According to the patient her primary area of pain is in her lower back. She admits that the right side is greater than the left. She admits that the pain radiates down into her buttocks. She denies any previous surgeries, interventional therapy, physical therapy. She did have recent images.  Her second area of pain is in her lower extremities with the right being greater than the left. She has some numbness and tingling; that is worse with standing. She does have occasional weakness. She denies any recent falls or injuries. She denies any previous surgeries, interventional therapies or physical therapy. She admits that she does try to walk to help with pain.  In considering the treatment plan options, Ashley Rodriguez was reminded that I no longer take patients for medication management only. I asked her to let me know if she had no intention of taking advantage of the interventional therapies, so that we could make arrangements to provide this space to someone interested. I also made it clear that undergoing interventional therapies for the purpose of getting pain medications is very inappropriate on the part of a patient, and it will not be tolerated in this practice. This type of behavior would suggest true addiction and therefore it requires referral to an addiction specialist.   Topics covered today: Ashley Rodriguez primary cause of pain, the results of her recent test(s), the treatment plan, treatment alternatives and the risks and possible complications of proposed treatment.  Further details on both, my assessment(s), as well as the proposed treatment plan, please see below.  Controlled Substance Pharmacotherapy Assessment REMS (Risk Evaluation and Mitigation Strategy)  Analgesic: none Highest recorded MME/day: '40mg'$ /day MME/day:0 mg/day Pill Count: None expected due to no prior prescriptions written by our practice. No  notes on file Pharmacokinetics: Liberation and absorption (onset of action): WNL Distribution (time to peak effect): WNL Metabolism and excretion (duration of action): WNL         Pharmacodynamics: Desired effects: Analgesia: Ashley Rodriguez reports >50% benefit. Functional ability: Patient reports that medication allows her to accomplish basic ADLs Clinically meaningful improvement in function (CMIF): Sustained CMIF goals met Perceived effectiveness: Described as relatively effective, allowing for increase in activities of daily living (ADL) Undesirable effects: Side-effects or Adverse reactions: None reported Monitoring: [place] PMP: Online review of the past 71-monthperiod previously conducted. Not applicable at this point since we have not taken over the patient's medication management yet. List of all Serum Drug Screening Test(s):  No results found for: AMPHSCRSER, BARBSCRSER, BENZOSCRSER, COCAINSCRSER, PCPSCRSER, [place] UDS test(s) done:  No results found for: TOXASSSELUR, SUMMARY Last UDS on record: No results found for: TOXASSSELUR, SUMMARY UDS interpretation: No unexpected findings.          Medication Assessment Form: Patient introduced to form today Treatment compliance: Treatment may start today if patient agrees with proposed plan. Evaluation of compliance is not applicable at this point Risk Assessment Profile: Aberrant behavior: See initial evaluations. None observed or detected today Comorbid factors increasing risk of overdose: See initial evaluation. No additional risks detected today Medical Psychology Evaluation: Please see scanned results in medical record.     Opioid Risk Tool - 01/03/17 1342      Family History of Substance Abuse   Alcohol Negative   Illegal Drugs Negative   Rx Drugs Negative     Personal History of Substance Abuse   Alcohol Negative   Illegal Drugs Negative   Rx Drugs Negative     Age    Age between 185-45years  No     History of Preadolescent Sexual Abuse   History of Preadolescent Sexual Abuse Negative or Female     Psychological Disease   Psychological Disease Negative   Depression Negative     Total Score   Opioid Risk Tool Scoring 0   Opioid Risk Interpretation Low Risk     ORT Scoring interpretation table:  Score <3 = Low Risk for SUD  Score between 4-7 = Moderate Risk for SUD  Score >8 = High Risk for Opioid Abuse   Risk Mitigation Strategies:  Patient opioid safety counseling: Completed today. Counseling provided to patient as per "Patient Counseling Document". Document signed by patient, attesting to counseling and understanding Patient-Prescriber Agreement (PPA): Obtained today.  Controlled substance notification to other providers: Written and sent today.  Pharmacologic Plan: Ms. MMorrhas voiced her wishes to stay away from opiates.             Laboratory Chemistry  Inflammation Markers (CRP: Acute Phase) (ESR: Chronic Phase) Lab Results  Component Value Date   CRP 0.8 01/03/2017   ESRSEDRATE 6 01/03/2017                 Renal Function Markers Lab Results  Component Value Date   BUN 9 01/03/2017   CREATININE 0.80 01/03/2017   GFRAA 90 01/03/2017   GFRNONAA 78 01/03/2017                 Hepatic Function Markers Lab Results  Component Value Date   AST 28 01/03/2017   ALT 24 01/03/2017  ALBUMIN 4.8 01/03/2017   ALKPHOS 55 01/03/2017                 Electrolytes Lab Results  Component Value Date   NA 140 01/03/2017   K 4.6 01/03/2017   CL 98 01/03/2017   CALCIUM 10.0 01/03/2017   MG 2.1 01/03/2017                 Neuropathy Markers Lab Results  Component Value Date   VITAMINB12 740 01/03/2017                 Bone Pathology Markers Lab Results  Component Value Date   ALKPHOS 55 01/03/2017   25OHVITD1 50 01/03/2017   25OHVITD2 <1.0 01/03/2017   25OHVITD3 50 01/03/2017   CALCIUM 10.0 01/03/2017                  Coagulation Parameters No results found for: INR, LABPROT, APTT, PLT               Cardiovascular Markers No results found for: BNP, HGB, HCT               Note: Lab results reviewed.  Recent Diagnostic Imaging Review  Lumbosacral Imaging: Lumbar DG F/E views:  Results for orders placed during the hospital encounter of 01/03/17  DG Lumb Spine Flex&Ext Only   Narrative CLINICAL DATA:  Chronic low back pain with pain of both lower extremities without known injury.  EXAM: LUMBAR SPINE FLEX AND EXTEND ONLY - 2-3 VIEW  COMPARISON:  None.  FINDINGS: Minimal grade 1 anterolisthesis of L5-S1 is noted due to bilateral pars defects of L5. Severe degenerative disc disease is noted at L1-2, L2-3, L3-4 and L4-5. No acute fracture is noted. No change in vertebral body alignment is noted on flexion or extension views.  IMPRESSION: Severe multilevel degenerative disc disease. Minimal grade 1 anterolisthesis of L5-S1 is noted secondary to bilateral pars defects of L5.   Electronically Signed   By: Marijo Conception, M.D.   On: 01/03/2017 15:26    Note: Results of ordered imaging test(s) reviewed and explained to patient in Layman's terms. Copy of results provided to patient  Meds   Current Outpatient Prescriptions:  .  acetaminophen (TYLENOL) 325 MG tablet, Take 325 mg by mouth every 6 (six) hours as needed., Disp: , Rfl:  .  amLODipine (NORVASC) 5 MG tablet, Take 5 mg by mouth daily., Disp: , Rfl:  .  atenolol (TENORMIN) 100 MG tablet, Take 100 mg by mouth daily., Disp: , Rfl:  .  calcium carbonate (CALCIUM 600) 600 MG TABS tablet, Take 600 mg by mouth daily with breakfast., Disp: , Rfl:  .  etanercept (ENBREL) 50 MG/ML injection, Inject 50 mg into the skin once a week., Disp: , Rfl:  .  fexofenadine (ALLEGRA) 180 MG tablet, Take 180 mg by mouth daily., Disp: , Rfl:  .  fluticasone (FLONASE) 50 MCG/ACT nasal spray, , Disp: , Rfl: 4 .  meloxicam (MOBIC) 15 MG tablet, Take 15 mg by  mouth daily. , Disp: , Rfl: 2 .  montelukast (SINGULAIR) 10 MG tablet, , Disp: , Rfl: 11 .  Multiple Vitamins-Minerals (MULTIVITAMIN WITH MINERALS) tablet, Take 1 tablet by mouth daily., Disp: , Rfl:  .  predniSONE (DELTASONE) 5 MG tablet, Take 5 mg by mouth daily with breakfast., Disp: , Rfl:  .  valACYclovir (VALTREX) 500 MG tablet, Take 500 mg by mouth as needed., Disp: , Rfl:   ROS  Constitutional: Denies any fever or chills Gastrointestinal: No reported hemesis, hematochezia, vomiting, or acute GI distress Musculoskeletal: Denies any acute onset joint swelling, redness, loss of ROM, or weakness Neurological: No reported episodes of acute onset apraxia, aphasia, dysarthria, agnosia, amnesia, paralysis, loss of coordination, or loss of consciousness  Allergies  Ms. Gough is allergic to codeine and sulfa antibiotics.  [place]  Drug: Ms. Anfinson  reports that she does not use drugs. Alcohol:  reports that she drinks about 2.4 oz of alcohol per week . Tobacco:  reports that she has never smoked. She has never used smokeless tobacco. Medical:  has a past medical history of Arthritis; Flank lipoma (08/17/2016); Hypertension; and Neck mass (04/07/2013). Surgical: Ms. Rivenbark  has a past surgical history that includes Abdominal hysterectomy (2000); lipoma removed (04/2012); Colonoscopy (2015); and Neck mass excision (04/28/14). Family: family history is not on file.  Constitutional Exam  General appearance: Well nourished, well developed, and well hydrated. In no apparent acute distress Vitals:   01/16/17 0752  BP: (!) 134/95  Pulse: 70  Resp: 16  Temp: 97.8 F (36.6 C)  SpO2: 97%  Weight: 144 lb (65.3 kg)  Height: '5\' 4"'$  (1.626 m)   BMI Assessment: Estimated body mass index is 24.72 kg/m as calculated from the following:   Height as of this encounter: '5\' 4"'$  (1.626 m).   Weight as of this encounter: 144 lb (65.3 kg).  BMI interpretation table: BMI level Category Range association with  higher incidence of chronic pain  <18 kg/m2 Underweight   18.5-24.9 kg/m2 Ideal body weight   25-29.9 kg/m2 Overweight Increased incidence by 20%  30-34.9 kg/m2 Obese (Class I) Increased incidence by 68%  35-39.9 kg/m2 Severe obesity (Class II) Increased incidence by 136%  >40 kg/m2 Extreme obesity (Class III) Increased incidence by 254%   BMI Readings from Last 4 Encounters:  01/16/17 24.72 kg/m  01/03/17 24.25 kg/m  08/16/16 25.75 kg/m  07/04/16 26.65 kg/m   Wt Readings from Last 4 Encounters:  01/16/17 144 lb (65.3 kg)  01/03/17 143 lb 8 oz (65.1 kg)  08/16/16 150 lb (68 kg)  07/04/16 157 lb 11.2 oz (71.5 kg)  Psych/Mental status: Alert, oriented x 3 (person, place, & time)       Eyes: PERLA Respiratory: No evidence of acute respiratory distress  Cervical Spine Area Exam  Skin & Axial Inspection: No masses, redness, edema, swelling, or associated skin lesions Alignment: Symmetrical Functional ROM: Unrestricted ROM      Stability: No instability detected Muscle Tone/Strength: Functionally intact. No obvious neuro-muscular anomalies detected. Sensory (Neurological): Unimpaired Palpation: No palpable anomalies              Upper Extremity (UE) Exam    Side: Right upper extremity  Side: Left upper extremity  Skin & Extremity Inspection: Skin color, temperature, and hair growth are WNL. No peripheral edema or cyanosis. No masses, redness, swelling, asymmetry, or associated skin lesions. No contractures.  Skin & Extremity Inspection: Skin color, temperature, and hair growth are WNL. No peripheral edema or cyanosis. No masses, redness, swelling, asymmetry, or associated skin lesions. No contractures.  Functional ROM: Unrestricted ROM          Functional ROM: Unrestricted ROM          Muscle Tone/Strength: Functionally intact. No obvious neuro-muscular anomalies detected.  Muscle Tone/Strength: Functionally intact. No obvious neuro-muscular anomalies detected.  Sensory  (Neurological): Unimpaired          Sensory (Neurological): Unimpaired  Palpation: No palpable anomalies              Palpation: No palpable anomalies              Specialized Test(s): Deferred         Specialized Test(s): Deferred          Thoracic Spine Area Exam  Skin & Axial Inspection: No masses, redness, or swelling Alignment: Symmetrical Functional ROM: Unrestricted ROM Stability: No instability detected Muscle Tone/Strength: Functionally intact. No obvious neuro-muscular anomalies detected. Sensory (Neurological): Unimpaired Muscle strength & Tone: No palpable anomalies  Lumbar Spine Area Exam  Skin & Axial Inspection: No masses, redness, or swelling Alignment: Symmetrical Functional ROM: Decreased ROM      Stability: No instability detected Muscle Tone/Strength: Functionally intact. No obvious neuro-muscular anomalies detected. Sensory (Neurological): Movement-associated pain Palpation: Complains of area being tender to palpation       Provocative Tests: Lumbar Hyperextension and rotation test: Positive bilaterally for facet joint pain. Lumbar Lateral bending test: evaluation deferred today       Patrick's Maneuver: Positive for bilateral S-I arthralgia              Gait & Posture Assessment  Ambulation: Unassisted Gait: Relatively normal for age and body habitus Posture: WNL   Lower Extremity Exam    Side: Right lower extremity  Side: Left lower extremity  Skin & Extremity Inspection: Skin color, temperature, and hair growth are WNL. No peripheral edema or cyanosis. No masses, redness, swelling, asymmetry, or associated skin lesions. No contractures.  Skin & Extremity Inspection: Skin color, temperature, and hair growth are WNL. No peripheral edema or cyanosis. No masses, redness, swelling, asymmetry, or associated skin lesions. No contractures.  Functional ROM: Unrestricted ROM          Functional ROM: Unrestricted ROM          Muscle Tone/Strength: Able to  Toe-walk & Heel-walk without problems  Muscle Tone/Strength: Able to Toe-walk & Heel-walk without problems  Sensory (Neurological): Unimpaired  Sensory (Neurological): Unimpaired  Palpation: No palpable anomalies  Palpation: No palpable anomalies   Assessment & Plan  Primary Diagnosis & Pertinent Problem List: The primary encounter diagnosis was Chronic pain syndrome. Diagnoses of Chronic low back pain (Primary Area of Pain) (Bilateral) (R>L), Lumbar facet syndrome (Bilateral) (R>L), Chronic sacroiliac joint pain (Bilateral) (R>L), DDD (degenerative disc disease), lumbar, Grade 1 Anterolisthesis of L5 over S1, L5 pars defect with spondylolisthesis (Bilateral), Chronic lower extremity pain (Secondary Area of Pain) (Bilateral) (R>L), Numbness and tingling (lower extremities) (Bilateral), Disorder of skeletal system, Neurogenic pain, Chronic musculoskeletal pain, Problems influencing health status, Pharmacologic therapy, and NSAID long-term use were also pertinent to this visit.  Visit Diagnosis: 1. Chronic pain syndrome   2. Chronic low back pain (Primary Area of Pain) (Bilateral) (R>L)   3. Lumbar facet syndrome (Bilateral) (R>L)   4. Chronic sacroiliac joint pain (Bilateral) (R>L)   5. DDD (degenerative disc disease), lumbar   6. Grade 1 Anterolisthesis of L5 over S1   7. L5 pars defect with spondylolisthesis (Bilateral)   8. Chronic lower extremity pain (Secondary Area of Pain) (Bilateral) (R>L)   9. Numbness and tingling (lower extremities) (Bilateral)   10. Disorder of skeletal system   11. Neurogenic pain   12. Chronic musculoskeletal pain   13. Problems influencing health status   14. Pharmacologic therapy   15. NSAID long-term use    Problems updated and reviewed during this visit: Problem  Grade 1 Anterolisthesis of  L5 over S1  L5 pars defect with spondylolisthesis (Bilateral)  Ddd (Degenerative Disc Disease), Lumbar  Lumbar facet syndrome (Bilateral) (R>L)  Chronic Pain  Syndrome  Chronic Musculoskeletal Pain  Neurogenic Pain  Chronic sacroiliac joint pain (Bilateral) (R>L)  Chronic low back pain (Primary Area of Pain) (Bilateral) (R>L)  Chronic lower extremity pain (Secondary Area of Pain) (Bilateral) (R>L)  Psoriatic Arthritis ([facility])  Nsaid Long-Term Use  Disorder of Skeletal System  Pharmacologic Therapy  Problems Influencing Health Status  Numbness and tingling (lower extremities) (Bilateral)  Flank Lipoma (Resolved)  Neck Mass (Resolved)    Plan of Care  Pharmacotherapy (Medications Ordered): Meds ordered this encounter  Medications  . ketorolac (TORADOL) injection 60 mg  . orphenadrine (NORFLEX) injection 30 mg    Procedure Orders     LUMBAR FACET(MEDIAL BRANCH NERVE BLOCK) [facility] Lab Orders  No laboratory test(s) ordered today   Imaging Orders  No imaging studies ordered today   Referral Orders  No referral(s) requested today    Pharmacological management options:  Opioid Analgesics: Options discussed, Ms. Lisle would prefer avoiding them Membrane stabilizer: Options discussed, Ms. Vandevender would prefer to avaoid taking any Muscle relaxant: Options discussed, Ms. Markes would prefer not taking any NSAID: Currently on an appropriate regimen Other analgesic(s): To be determined at a later time   Interventional management options: Planned, scheduled, and/or pending:    Diagnostic bilateral lumbar facet block under fluoroscopic guidance and IV sedation    Considering:   Diagnostic bilateral lumbar facet block  Possible bilateral lumbar facet RFA  Diagnostic bilateral sacroiliac joint block  Possible bilateral sacroiliac joint RFA  Diagnostic bilateral hip joint injection  Possible bilateral femoral nerve + obturator nerve block  Possible bilateral femoral nerve + obturator nerve RFA  Diagnostic right-sided L4-5 lumbar epidural steroid injection    PRN Procedures:   None at this time   Provider-requested follow-up: Return for  Procedure (with sedation): (B) L-FCT Blk.  No future appointments.  Primary Care Physician: Gaynelle Arabian, MD Location: [facility] Note by: Gaspar Cola, MD Date: 01/16/2017; Time: 8:59 AM

## 2017-01-16 NOTE — Patient Instructions (Addendum)
______________________________________________________________________________You have been given instructions for pre procedure for Facet block with sedation.Facet Blocks Patient Information  Description: The facets are joints in the spine between the vertebrae.  Like any joints in the body, facets can become irritated and painful.  Arthritis can also effect the facets.  By injecting steroids and local anesthetic in and around these joints, we can temporarily block the nerve supply to them.  Steroids act directly on irritated nerves and tissues to reduce selling and inflammation which often leads to decreased pain.  Facet blocks may be done anywhere along the spine from the neck to the low back depending upon the location of your pain.   After numbing the skin with local anesthetic (like Novocaine), a small needle is passed onto the facet joints under x-ray guidance.  You may experience a sensation of pressure while this is being done.  The entire block usually lasts about 15-25 minutes.   Conditions which may be treated by facet blocks:   Low back/buttock pain  Neck/shoulder pain  Certain types of headaches  Preparation for the injection:  1. Do not eat any solid food or dairy products within 8 hours of your appointment. 2. You may drink clear liquid up to 3 hours before appointment.  Clear liquids include water, black coffee, juice or soda.  No milk or cream please. 3. You may take your regular medication, including pain medications, with a sip of water before your appointment.  Diabetics should hold regular insulin (if taken separately) and take 1/2 normal NPH dose the morning of the procedure.  Carry some sugar containing items with you to your appointment. 4. A driver must accompany you and be prepared to drive you home after your procedure. 5. Bring all your current medications with you. 6. An IV may be inserted and sedation may be given at the discretion of the physician. 7. A blood  pressure cuff, EKG and other monitors will often be applied during the procedure.  Some patients may need to have extra oxygen administered for a short period. 8. You will be asked to provide medical information, including your allergies and medications, prior to the procedure.  We must know immediately if you are taking blood thinners (like Coumadin/Warfarin) or if you are allergic to IV iodine contrast (dye).  We must know if you could possible be pregnant.  Possible side-effects:   Bleeding from needle site  Infection (rare, may require surgery)  Nerve injury (rare)  Numbness & tingling (temporary)  Difficulty urinating (rare, temporary)  Spinal headache (a headache worse with upright posture)  Light-headedness (temporary)  Pain at injection site (serveral days)  Decreased blood pressure (rare, temporary)  Weakness in arm/leg (temporary)  Pressure sensation in back/neck (temporary)   Call if you experience:   Fever/chills associated with headache or increased back/neck pain  Headache worsened by an upright position  New onset, weakness or numbness of an extremity below the injection site  Hives or difficulty breathing (go to the emergency room)  Inflammation or drainage at the injection site(s)  Severe back/neck pain greater than usual  New symptoms which are concerning to you  Please note:  Although the local anesthetic injected can often make your back or neck feel good for several hours after the injection, the pain will likely return. It takes 3-7 days for steroids to work.  You may not notice any pain relief for at least one week.  If effective, we will often do a series of 2-3 injections spaced  3-6 weeks apart to maximally decrease your pain.  After the initial series, you may be a candidate for a more permanent nerve block of the facets.  If you have any questions, please call #[phone]) [facility]______________  Preparing for Procedure with Sedation Instructions: . Oral Intake: Do not eat or drink anything for at least 8 hours prior to your procedure. . Transportation: Public transportation is not allowed. Bring an adult driver. The driver must be physically present in our waiting room before any procedure can be started. Marland Kitchen Physical Assistance: Bring an adult physically capable of assisting you, in the event you need help. This adult should keep you company at home for at least 6 hours after the procedure. . Blood Pressure Medicine: Take your blood pressure medicine with a sip of water the morning of the procedure. . Blood thinners:  . Diabetics on insulin: Notify the staff so that you can be scheduled 1st case in the morning. If your diabetes requires high dose insulin, take only  of your normal insulin dose the morning of the procedure and notify the staff that you have done so. . Preventing infections: Shower with an antibacterial soap the morning of your procedure. . Build-up your immune system: Take 1000 mg of Vitamin C with every meal (3 times a day) the day prior to your procedure. Marland Kitchen Antibiotics: Inform the staff if you have a condition or reason that requires you to take antibiotics before dental procedures. . Pregnancy: If you are pregnant, call and cancel the procedure. . Sickness: If you have a cold, fever, or any active infections, call and cancel the procedure. . Arrival: You must be in the facility at least 30 minutes prior to your scheduled procedure. . Children: Do not bring children with you. . Dress appropriately: Bring dark clothing that you would not mind if they get stained. . Valuables: Do not bring any jewelry or valuables. Procedure appointments are reserved for interventional treatments only. Marland Kitchen No Prescription Refills. . No medication changes will be discussed during procedure appointments. . No disability issues will be  discussed. ____________________________________________________________________________________________

## 2017-01-21 ENCOUNTER — Ambulatory Visit: Payer: BLUE CROSS/BLUE SHIELD | Admitting: Pain Medicine

## 2017-01-22 ENCOUNTER — Ambulatory Visit (HOSPITAL_BASED_OUTPATIENT_CLINIC_OR_DEPARTMENT_OTHER): Payer: BLUE CROSS/BLUE SHIELD | Admitting: Pain Medicine

## 2017-01-22 ENCOUNTER — Encounter: Payer: Self-pay | Admitting: Pain Medicine

## 2017-01-22 ENCOUNTER — Ambulatory Visit
Admission: RE | Admit: 2017-01-22 | Discharge: 2017-01-22 | Disposition: A | Discharge status: Home / Self Care | Payer: BLUE CROSS/BLUE SHIELD | Source: Ambulatory Visit | Attending: Pain Medicine | Admitting: Pain Medicine

## 2017-01-22 VITALS — BP 133/67 | HR 62 | Temp 98.0°F | Resp 13 | Ht 64.0 in | Wt 144.0 lb

## 2017-01-22 DIAGNOSIS — M4696 Unspecified inflammatory spondylopathy, lumbar region: Secondary | ICD-10-CM

## 2017-01-22 DIAGNOSIS — M5442 Lumbago with sciatica, left side: Secondary | ICD-10-CM | POA: Diagnosis not present

## 2017-01-22 DIAGNOSIS — M4300 Spondylolysis, site unspecified: Secondary | ICD-10-CM | POA: Diagnosis not present

## 2017-01-22 DIAGNOSIS — M47816 Spondylosis without myelopathy or radiculopathy, lumbar region: Secondary | ICD-10-CM

## 2017-01-22 DIAGNOSIS — M5136 Other intervertebral disc degeneration, lumbar region: Secondary | ICD-10-CM | POA: Insufficient documentation

## 2017-01-22 DIAGNOSIS — M5441 Lumbago with sciatica, right side: Secondary | ICD-10-CM | POA: Diagnosis not present

## 2017-01-22 DIAGNOSIS — G8929 Other chronic pain: Secondary | ICD-10-CM | POA: Diagnosis not present

## 2017-01-22 DIAGNOSIS — M4310 Spondylolisthesis, site unspecified: Secondary | ICD-10-CM | POA: Insufficient documentation

## 2017-01-22 MED ORDER — MIDAZOLAM HCL 5 MG/5ML IJ SOLN
1.0000 mg | INTRAMUSCULAR | Status: DC | PRN
  Administered 2017-01-22: 2 mg via INTRAVENOUS

## 2017-01-22 MED ORDER — LIDOCAINE HCL 2 % IJ SOLN
INTRAMUSCULAR | Status: AC
  Filled 2017-01-22: qty 20

## 2017-01-22 MED ORDER — FENTANYL CITRATE (PF) 100 MCG/2ML IJ SOLN
INTRAMUSCULAR | Status: AC
  Filled 2017-01-22: qty 2

## 2017-01-22 MED ORDER — LACTATED RINGERS IV SOLN
1000.0000 mL | Freq: Once | INTRAVENOUS | Status: AC
  Administered 2017-01-22: 1000 mL via INTRAVENOUS

## 2017-01-22 MED ORDER — TRIAMCINOLONE ACETONIDE 40 MG/ML IJ SUSP
40.0000 mg | Freq: Once | INTRAMUSCULAR | Status: AC
  Administered 2017-01-22: 40 mg

## 2017-01-22 MED ORDER — LIDOCAINE HCL 2 % IJ SOLN
10.0000 mL | Freq: Once | INTRAMUSCULAR | Status: AC
  Administered 2017-01-22: 400 mg

## 2017-01-22 MED ORDER — FENTANYL CITRATE (PF) 100 MCG/2ML IJ SOLN
25.0000 ug | INTRAMUSCULAR | Status: DC | PRN
  Administered 2017-01-22: 50 ug via INTRAVENOUS

## 2017-01-22 MED ORDER — TRIAMCINOLONE ACETONIDE 40 MG/ML IJ SUSP
40.0000 mg | Freq: Once | INTRAMUSCULAR | Status: AC
Start: 2017-01-22 — End: 2017-01-22
  Administered 2017-01-22: 40 mg

## 2017-01-22 MED ORDER — MIDAZOLAM HCL 5 MG/5ML IJ SOLN
INTRAMUSCULAR | Status: AC
Start: 2017-01-22 — End: ?
  Filled 2017-01-22: qty 5

## 2017-01-22 MED ORDER — ROPIVACAINE HCL 2 MG/ML IJ SOLN
9.0000 mL | Freq: Once | INTRAMUSCULAR | Status: AC
  Administered 2017-01-22: 9 mL via PERINEURAL

## 2017-01-22 MED ORDER — TRIAMCINOLONE ACETONIDE 40 MG/ML IJ SUSP
INTRAMUSCULAR | Status: AC
  Filled 2017-01-22: qty 2

## 2017-01-22 MED ORDER — ROPIVACAINE HCL 2 MG/ML IJ SOLN
INTRAMUSCULAR | Status: AC
  Filled 2017-01-22: qty 20

## 2017-01-22 NOTE — Patient Instructions (Addendum)
____________________________________________________________________________________________  Post-Procedure instructions Instructions:  Apply ice: Fill a plastic sandwich bag with crushed ice. Cover it with a small towel and apply to injection site. Apply for 15 minutes then remove x 15 minutes. Repeat sequence on day of procedure, until you go to bed. The purpose is to minimize swelling and discomfort after procedure.  Apply heat: Apply heat to procedure site starting the day following the procedure. The purpose is to treat any soreness and discomfort from the procedure.  Food intake: Start with clear liquids (like water) and advance to regular food, as tolerated.   Physical activities: Keep activities to a minimum for the first 8 hours after the procedure.   Driving: If you have received any sedation, you are not allowed to drive for 24 hours after your procedure.  Blood thinner: Restart your blood thinner 6 hours after your procedure. (Only for those taking blood thinners)  Insulin: As soon as you can eat, you may resume your normal dosing schedule. (Only for those taking insulin)  Infection prevention: Keep procedure site clean and dry.  Post-procedure Pain Diary: Extremely important that this be done correctly and accurately. Recorded information will be used to determine the next step in treatment.  Pain evaluated is that of treated area only. Do not include pain from an untreated area.  Complete every hour, on the hour, for the initial 8 hours. Set an alarm to help you do this part accurately.  Do not go to sleep and have it completed later. It will not be accurate.  Follow-up appointment: Keep your follow-up appointment after the procedure. Usually 2 weeks for most procedures. (6 weeks in the case of radiofrequency.) Bring you pain diary.  Expect:  From numbing medicine (AKA: Local Anesthetics): Numbness or decrease in pain.  Onset: Full effect within 15 minutes of  injected.  Duration: It will depend on the type of local anesthetic used. On the average, 1 to 8 hours.   From steroids: Decrease in swelling or inflammation. Once inflammation is improved, relief of the pain will follow.  Onset of benefits: Depends on the amount of swelling present. The more swelling, the longer it will take for the benefits to be seen. In some cases, up to 10 days.  Duration: Steroids will stay in the system x 2 weeks. Duration of benefits will depend on multiple posibilities including persistent irritating factors.  From procedure: Some discomfort is to be expected once the numbing medicine wears off. This should be minimal if ice and heat are applied as instructed. Call if:  You experience numbness and weakness that gets worse with time, as opposed to wearing off.  New onset bowel or bladder incontinence. (Spinal procedures only)  Emergency Numbers:  Durning business hours (Monday - Thursday, 8:00 AM - 4:00 PM) (Friday, 9:00 AM - 12:00 Noon): [phone]  After hours: [phone] ____________________________________________________________________________________________  Pain Management Discharge Instructions  General Discharge Instructions :  If you need to reach your doctor call: Monday-Friday 8:00 am - 4:00 pm at [phone] or toll free [phone].  After clinic hours [phone] to have operator reach doctor.  Bring all of your medication bottles to all your appointments in the pain clinic.  To cancel or reschedule your appointment with Pain Management please remember to call 24 hours in advance to avoid a fee.  Refer to the educational materials which you have been given on: General Risks, I had my Procedure. Discharge Instructions, Post Sedation.  Post Procedure Instructions:  The drugs you   were given will stay in your system until tomorrow, so for the next 24 hours you should not drive, make any legal decisions or drink any alcoholic  beverages.  You may eat anything you prefer, but it is better to start with liquids then soups and crackers, and gradually work up to solid foods.  Please notify your doctor immediately if you have any unusual bleeding, trouble breathing or pain that is not related to your normal pain.  Depending on the type of procedure that was done, some parts of your body may feel week and/or numb.  This usually clears up by tonight or the next day.  Walk with the use of an assistive device or accompanied by an adult for the 24 hours.  You may use ice on the affected area for the first 24 hours.  Put ice in a Ziploc bag and cover with a towel and place against area 15 minutes on 15 minutes off.  You may switch to heat after 24 hours. Facet Joint Block The facet joints connect the bones of the spine (vertebrae). They make it possible for you to bend, twist, and make other movements with your spine. They also keep you from bending too far, twisting too far, and making other excessive movements. A facet joint block is a procedure where a numbing medicine (anesthetic) is injected into a facet joint. Often, a type of anti-inflammatory medicine called a steroid is also injected. A facet joint block may be done to diagnose neck or back pain. If the pain gets better after a facet joint block, it means the pain is probably coming from the facet joint. If the pain does not get better, it means the pain is probably not coming from the facet joint. A facet joint block may also be done to relieve neck or back pain caused by an inflamed facet joint. A facet joint block is only done to relieve pain if the pain does not improve with other methods, such as medicine, exercise programs, and physical therapy. Tell a health care provider about:  Any allergies you have.  All medicines you are taking, including vitamins, herbs, eye drops, creams, and over-the-counter medicines.  Any problems you or family members have had with  anesthetic medicines.  Any blood disorders you have.  Any surgeries you have had.  Any medical conditions you have.  Whether you are pregnant or may be pregnant. What are the risks? Generally, this is a safe procedure. However, problems may occur, including:  Bleeding.  Injury to a nerve near the injection site.  Pain at the injection site.  Weakness or numbness in areas controlled by nerves near the injection site.  Infection.  Temporary fluid retention.  Allergic reactions to medicines or dyes.  Injury to other structures or organs near the injection site.  What happens before the procedure?  Follow instructions from your health care provider about eating or drinking restrictions.  Ask your health care provider about: ? Changing or stopping your regular medicines. This is especially important if you are taking diabetes medicines or blood thinners. ? Taking medicines such as aspirin and ibuprofen. These medicines can thin your blood. Do not take these medicines before your procedure if your health care provider instructs you not to.  Do not take any new dietary supplements or medicines without asking your health care provider first.  Plan to have someone take you home after the procedure. What happens during the procedure?  You may need to remove your   clothing and dress in an open-back gown.  The procedure will be done while you are lying on an X-ray table. You will most likely be asked to lie on your stomach, but you may be asked to lie in a different position if an injection will be made in your neck.  Machines will be used to monitor your oxygen levels, heart rate, and blood pressure.  If an injection will be made in your neck, an IV tube will be inserted into one of your veins. Fluids and medicine will flow directly into your body through the IV tube.  The area over the facet joint where the injection will be made will be cleaned with soap. The surrounding skin  will be covered with clean drapes.  A numbing medicine (local anesthetic) will be applied to your skin. Your skin may sting or burn for a moment.  A video X-ray machine (fluoroscopy) will be used to locate the joint. In some cases, a CT scan may be used.  A contrast dye may be injected into the facet joint area to help locate the joint.  When the joint is located, an anesthetic will be injected into the joint through the needle.  Your health care provider will ask you whether you feel pain relief. If you do feel relief, a steroid may be injected to provide pain relief for a longer period of time. If you do not feel relief or feel only partial relief, additional injections of an anesthetic may be made in other facet joints.  The needle will be removed.  Your skin will be cleaned.  A bandage (dressing) will be applied over each injection site. The procedure may vary among health care providers and hospitals. What happens after the procedure?  You will be observed for 15-30 minutes before being allowed to go home. This information is not intended to replace advice given to you by your health care provider. Make sure you discuss any questions you have with your health care provider. Document Released: 09/05/2006 Document Revised: 05/18/2015 Document Reviewed: 01/10/2015 Elsevier Interactive Patient Education  2018 [employer]. Pain Management Discharge Instructions  General Discharge Instructions :  If you need to reach your doctor call: Monday-Friday 8:00 am - 4:00 pm at [phone] or toll free [phone].  After clinic hours [phone] to have operator reach doctor.  Bring all of your medication bottles to all your appointments in the pain clinic.  To cancel or reschedule your appointment with Pain Management please remember to call 24 hours in advance to avoid a fee.  Refer to the educational materials which you have been given on: General Risks, I had my Procedure.  Discharge Instructions, Post Sedation.  Post Procedure Instructions:  The drugs you were given will stay in your system until tomorrow, so for the next 24 hours you should not drive, make any legal decisions or drink any alcoholic beverages.  You may eat anything you prefer, but it is better to start with liquids then soups and crackers, and gradually work up to solid foods.  Please notify your doctor immediately if you have any unusual bleeding, trouble breathing or pain that is not related to your normal pain.  Depending on the type of procedure that was done, some parts of your body may feel week and/or numb.  This usually clears up by tonight or the next day.  Walk with the use of an assistive device or accompanied by an adult for the 24 hours.  You may use ice  on the affected area for the first 24 hours.  Put ice in a Ziploc bag and cover with a towel and place against area 15 minutes on 15 minutes off.  You may switch to heat after 24 hours. 

## 2017-01-22 NOTE — Progress Notes (Signed)
Patient's Name: Ashley Rodriguez  MRN: [id number]  Referring Provider: Milinda Pointer, MD  DOB: 1950/07/01  PCP: Gaynelle Arabian, MD  DOS: 01/22/2017  Note by: Gaspar Cola, MD  Service setting: Ambulatory outpatient  Specialty: Interventional Pain Management  Patient type: Established  Location: [facility] (AMB) Pain Management Facility  Visit type: Interventional Procedure   Primary Reason for Visit: Interventional Pain Management Treatment. CC: Back Pain (low)  Procedure:  Anesthesia, Analgesia, Anxiolysis:  Type: Diagnostic Medial Branch Facet Block Region: Lumbar Level: L2, L3, L4, L5, & S1 Medial Branch Level(s) Laterality: Bilateral  Type: Local Anesthesia with Moderate (Conscious) Sedation Local Anesthetic: Lidocaine 1% Route: Intravenous (IV) IV Access: Secured Sedation: Meaningful verbal contact was maintained at all times during the procedure  Indication(s): Analgesia and Anxiety  Indications: 1. Lumbar facet syndrome (Bilateral) (R>L)   2. L5 pars defect with spondylolisthesis (Bilateral)   3. DDD (degenerative disc disease), lumbar   4. Chronic low back pain (Primary Area of Pain) (Bilateral) (R>L)    Pain Score: Pre-procedure: 3 /10 Post-procedure: 0-No pain/10  Pre-op Assessment:  Ashley Rodriguez is a 66 y.o. (year old), female patient, seen today for interventional treatment. She  has a past surgical history that includes Abdominal hysterectomy (2000); lipoma removed (04/2012); Colonoscopy (2015); and Neck mass excision (04/28/14). Ashley Rodriguez has a current medication list which includes the following prescription(s): acetaminophen, amlodipine, atenolol, calcium carbonate, enbrel sureclick, etanercept, fexofenadine, fluticasone, meloxicam, montelukast, multivitamin with minerals, prednisone, and valacyclovir, and the following Facility-Administered Medications: fentanyl and midazolam. Her primarily concern today is the Back Pain (low)  Initial Vital Signs: There were no  vitals taken for this visit. BMI: Estimated body mass index is 24.72 kg/m as calculated from the following:   Height as of this encounter: 5\' 4"  (1.626 m).   Weight as of this encounter: 144 lb (65.3 kg).  Risk Assessment: Allergies: Reviewed. She is allergic to codeine and sulfa antibiotics.  Allergy Precautions: None required Coagulopathies: Reviewed. None identified.  Blood-thinner therapy: None at this time Active Infection(s): Reviewed. None identified. Ashley Rodriguez is afebrile  Site Confirmation: Ashley Rodriguez was asked to confirm the procedure and laterality before marking the site Procedure checklist: Completed Consent: Before the procedure and under the influence of no sedative(s), amnesic(s), or anxiolytics, the patient was informed of the treatment options, risks and possible complications. To fulfill our ethical and legal obligations, as recommended by the American Medical Association's Code of Ethics, I have informed the patient of my clinical impression; the nature and purpose of the treatment or procedure; the risks, benefits, and possible complications of the intervention; the alternatives, including doing nothing; the risk(s) and benefit(s) of the alternative treatment(s) or procedure(s); and the risk(s) and benefit(s) of doing nothing. The patient was provided information about the general risks and possible complications associated with the procedure. These may include, but are not limited to: failure to achieve desired goals, infection, bleeding, organ or nerve damage, allergic reactions, paralysis, and death. In addition, the patient was informed of those risks and complications associated to Spine-related procedures, such as failure to decrease pain; infection (i.e.: Meningitis, epidural or intraspinal abscess); bleeding (i.e.: epidural hematoma, subarachnoid hemorrhage, or any other type of intraspinal or peri-dural bleeding); organ or nerve damage (i.e.: Any type of peripheral  nerve, nerve root, or spinal cord injury) with subsequent damage to sensory, motor, and/or autonomic systems, resulting in permanent pain, numbness, and/or weakness of one or several areas of the body; allergic reactions; (i.e.: anaphylactic reaction); and/or death.  Furthermore, the patient was informed of those risks and complications associated with the medications. These include, but are not limited to: allergic reactions (i.e.: anaphylactic or anaphylactoid reaction(s)); adrenal axis suppression; blood sugar elevation that in diabetics may result in ketoacidosis or comma; water retention that in patients with history of congestive heart failure may result in shortness of breath, pulmonary edema, and decompensation with resultant heart failure; weight gain; swelling or edema; medication-induced neural toxicity; particulate matter embolism and blood vessel occlusion with resultant organ, and/or nervous system infarction; and/or aseptic necrosis of one or more joints. Finally, the patient was informed that Medicine is not an exact science; therefore, there is also the possibility of unforeseen or unpredictable risks and/or possible complications that may result in a catastrophic outcome. The patient indicated having understood very clearly. We have given the patient no guarantees and we have made no promises. Enough time was given to the patient to ask questions, all of which were answered to the patient's satisfaction. Ashley Rodriguez has indicated that she wanted to continue with the procedure. Attestation: I, the ordering provider, attest that I have discussed with the patient the benefits, risks, side-effects, alternatives, likelihood of achieving goals, and potential problems during recovery for the procedure that I have provided informed consent. Date: 01/22/2017; Time: 7:25 AM  Pre-Procedure Preparation:  Monitoring: As per clinic protocol. Respiration, ETCO2, SpO2, BP, heart rate and rhythm monitor placed  and checked for adequate function Safety Precautions: Patient was assessed for positional comfort and pressure points before starting the procedure. Time-out: I initiated and conducted the "Time-out" before starting the procedure, as per protocol. The patient was asked to participate by confirming the accuracy of the "Time Out" information. Verification of the correct person, site, and procedure were performed and confirmed by me, the nursing staff, and the patient. "Time-out" conducted as per Joint Commission's Universal Protocol (UP.01.01.01). "Time-out" Date & Time: 01/22/2017; 0903 hrs.  Description of Procedure Process:   Position: Prone Target Area: For Lumbar Facet blocks, the target is the groove formed by the junction of the transverse process and superior articular process. For the L5 dorsal ramus, the target is the notch between superior articular process and sacral ala. For the S1 dorsal ramus, the target is the superior and lateral edge of the posterior S1 Sacral foramen. Approach: Paramedial approach. Area Prepped: Entire Posterior Lumbosacral Region Prepping solution: ChloraPrep (2% chlorhexidine gluconate and 70% isopropyl alcohol) Safety Precautions: Aspiration looking for blood return was conducted prior to all injections. At no point did we inject any substances, as a needle was being advanced. No attempts were made at seeking any paresthesias. Safe injection practices and needle disposal techniques used. Medications properly checked for expiration dates. SDV (single dose vial) medications used. Description of the Procedure: Protocol guidelines were followed. The patient was placed in position over the fluoroscopy table. The target area was identified and the area prepped in the usual manner. Skin desensitized using vapocoolant spray. Skin & deeper tissues infiltrated with local anesthetic. Appropriate amount of time allowed to pass for local anesthetics to take effect. The procedure  needle was introduced through the skin, ipsilateral to the reported pain, and advanced to the target area. Employing the "Medial Branch Technique", the needles were advanced to the angle made by the superior and medial portion of the transverse process, and the lateral and inferior portion of the superior articulating process of the targeted vertebral bodies. This area is known as "Burton's Eye" or the "Eye of the [place] Dog".  A procedure needle was introduced through the skin, and this time advanced to the angle made by the superior and medial border of the sacral ala, and the lateral border of the S1 vertebral body. This last needle was later repositioned at the superior and lateral border of the posterior S1 foramen. Negative aspiration confirmed. Solution injected in intermittent fashion, asking for systemic symptoms every 0.5cc of injectate. The needles were then removed and the area cleansed, making sure to leave some of the prepping solution back to take advantage of its long term bactericidal properties.   Illustration of the posterior view of the lumbar spine and the posterior neural structures. Laminae of L2 through S1 are labeled. DPRL5, dorsal primary ramus of L5; DPRS1, dorsal primary ramus of S1; DPR3, dorsal primary ramus of L3; FJ, facet (zygapophyseal) joint L3-L4; I, inferior articular process of L4; LB1, lateral branch of dorsal primary ramus of L1; IAB, inferior articular branches from L3 medial branch (supplies L4-L5 facet joint); IBP, intermediate branch plexus; MB3, medial branch of dorsal primary ramus of L3; NR3, third lumbar nerve root; S, superior articular process of L5; SAB, superior articular branches from L4 (supplies L4-5 facet joint also); TP3, transverse process of L3.  Vitals:   01/22/17 0915 01/22/17 0925 01/22/17 0935 01/22/17 0945  BP: (!) 113/57 (!) 145/68 (!) 143/63 133/67  Pulse:      Resp: 11 12 14 13   Temp:      SpO2: 95% 98% 92% 96%  Weight:      Height:         Start Time: 0903 hrs. End Time: 0914 hrs. Materials:  Needle(s) Type: Regular needle Gauge: 22G Length: 3.5-in Medication(s): We administered lactated ringers, midazolam, fentaNYL, lidocaine, triamcinolone acetonide, ropivacaine (PF) 2 mg/mL (0.2%), triamcinolone acetonide, and ropivacaine (PF) 2 mg/mL (0.2%). Please see chart orders for dosing details.  Imaging Guidance (Spinal):  Type of Imaging Technique: Fluoroscopy Guidance (Spinal) Indication(s): Assistance in needle guidance and placement for procedures requiring needle placement in or near specific anatomical locations not easily accessible without such assistance. Exposure Time: Please see nurses notes. Contrast: None used. Fluoroscopic Guidance: I was personally present during the use of fluoroscopy. "Tunnel Vision Technique" used to obtain the best possible view of the target area. Parallax error corrected before commencing the procedure. "Direction-depth-direction" technique used to introduce the needle under continuous pulsed fluoroscopy. Once target was reached, antero-posterior, oblique, and lateral fluoroscopic projection used confirm needle placement in all planes. Images permanently stored in EMR. Interpretation: No contrast injected. I personally interpreted the imaging intraoperatively. Adequate needle placement confirmed in multiple planes. Permanent images saved into the patient's record.  Antibiotic Prophylaxis:  Indication(s): None identified Antibiotic given: None  Post-operative Assessment:  EBL: None Complications: No immediate post-treatment complications observed by team, or reported by patient. Note: The patient tolerated the entire procedure well. A repeat set of vitals were taken after the procedure and the patient was kept under observation following institutional policy, for this type of procedure. Post-procedural neurological assessment was performed, showing return to baseline, prior to discharge. The  patient was provided with post-procedure discharge instructions, including a section on how to identify potential problems. Should any problems arise concerning this procedure, the patient was given instructions to immediately contact us, at any time, without hesitation. In any case, we plan to contact the patient by telephone for a follow-up status report regarding this interventional procedure. Comments:  No additional relevant information.  Plan of Care  Disposition: Discharge home  Discharge  Date & Time: 01/22/2017; 0949 hrs.   Physician-requested Follow-up:  Return for post-procedure eval by Dr. Dossie Arbour in 2 weeks.  Future Appointments Date Time Provider [place]  02/11/2017 1:45 PM Milinda Pointer, MD [facility] None    Imaging Orders     DG C-Arm 1-60 Min-No Report  Procedure Orders     LUMBAR FACET(MEDIAL BRANCH NERVE BLOCK) MBNB  Medications ordered for procedure: Meds ordered this encounter  Medications  . lactated ringers infusion 1,000 mL  . midazolam (VERSED) 5 MG/5ML injection 1-2 mg    Make sure Flumazenil is available in the pyxis when using this medication. If oversedation occurs, administer 0.2 mg IV over 15 sec. If after 45 sec no response, administer 0.2 mg again over 1 min; may repeat at 1 min intervals; not to exceed 4 doses (1 mg)  . fentaNYL (SUBLIMAZE) injection 25-50 mcg    Make sure Narcan is available in the pyxis when using this medication. In the event of respiratory depression (RR< 8/min): Titrate NARCAN (naloxone) in increments of 0.1 to 0.2 mg IV at 2-3 minute intervals, until desired degree of reversal.  . lidocaine (XYLOCAINE) 2 % (with pres) injection 200 mg  . triamcinolone acetonide (KENALOG-40) injection 40 mg  . ropivacaine (PF) 2 mg/mL (0.2%) (NAROPIN) injection 9 mL  . triamcinolone acetonide (KENALOG-40) injection 40 mg  . ropivacaine (PF) 2 mg/mL (0.2%) (NAROPIN) injection 9 mL   Medications administered: We administered  lactated ringers, midazolam, fentaNYL, lidocaine, triamcinolone acetonide, ropivacaine (PF) 2 mg/mL (0.2%), triamcinolone acetonide, and ropivacaine (PF) 2 mg/mL (0.2%).  See the medical record for exact dosing, route, and time of administration.  New Prescriptions   No medications on file   Primary Care Physician: Gaynelle Arabian, MD Location: [facility] Note by: Gaspar Cola, MD Date: 01/22/2017; Time: 10:15 AM  Disclaimer:  Medicine is not an exact science. The only guarantee in medicine is that nothing is guaranteed. It is important to note that the decision to proceed with this intervention was based on the information collected from the patient. The Data and conclusions were drawn from the patient's questionnaire, the interview, and the physical examination. Because the information was provided in large part by the patient, it cannot be guaranteed that it has not been purposely or unconsciously manipulated. Every effort has been made to obtain as much relevant data as possible for this evaluation. It is important to note that the conclusions that lead to this procedure are derived in large part from the available data. Always take into account that the treatment will also be dependent on availability of resources and existing treatment guidelines, considered by other Pain Management Practitioners as being common knowledge and practice, at the time of the intervention. For Medico-Legal purposes, it is also important to point out that variation in procedural techniques and pharmacological choices are the acceptable norm. The indications, contraindications, technique, and results of the above procedure should only be interpreted and judged by a Board-Certified Interventional Pain Specialist with extensive familiarity and expertise in the same exact procedure and technique.

## 2017-01-23 ENCOUNTER — Telehealth: Payer: Self-pay | Admitting: *Deleted

## 2017-01-23 NOTE — Telephone Encounter (Signed)
Spoke with patient re; procedure on yesterday, verbalizes no questions or concerns and states she feels like a different person.

## 2017-02-10 NOTE — Progress Notes (Signed)
Patient's Name: Ashley Rodriguez  MRN: [id number]  Referring Provider: Gaynelle Arabian, MD  DOB: 1950-12-18  PCP: Gaynelle Arabian, MD  DOS: 02/11/2017  Note by: Gaspar Cola, MD  Service setting: Ambulatory outpatient  Specialty: Interventional Pain Management  Location: [facility] (AMB) Pain Management Facility    Patient type: Established   Primary Reason(s) for Visit: Encounter for post-procedure evaluation of chronic illness with mild to moderate exacerbation CC: Leg Pain (left)  HPI  Ashley Rodriguez is a 66 y.o. year old, female patient, who comes today for a post-procedure evaluation. She has Psoriatic arthritis ([place]); Chronic low back pain (Primary Area of Pain) (Bilateral) (R>L); Chronic lower extremity pain (Secondary Area of Pain) (Bilateral) (R>L); Grade 1 Anterolisthesis of L5 over S1; L5 pars defect with spondylolisthesis (Bilateral); NSAID long-term use; Disorder of skeletal system; Pharmacologic therapy; Problems influencing health status; DDD (degenerative disc disease), lumbar; Lumbar facet syndrome (Bilateral) (R>L); Numbness and tingling (lower extremities) (Bilateral); Chronic pain syndrome; Chronic musculoskeletal pain; Neurogenic pain; and Chronic sacroiliac joint pain (Bilateral) (R>L) on her problem list. Her primarily concern today is the Leg Pain (left)  Pain Assessment: Location: Left, Right Leg Onset: More than a month ago Duration: Chronic pain Quality: Tingling Severity: 2 /10 (self-reported pain score)  Note: Reported level is compatible with observation.                   When using our objective Pain Scale, levels between 6 and 10/10 are said to belong in an emergency room, as it progressively worsens from a 6/10, described as severely limiting, requiring emergency care not usually available at an outpatient pain management facility. At a 6/10 level, communication becomes difficult and requires great effort. Assistance to reach the emergency department may be required.  Facial flushing and profuse sweating along with potentially dangerous increases in heart rate and blood pressure will be evident. Timing: Intermittent  Ashley Rodriguez comes in today for post-procedure evaluation after the treatment done on 01/22/2017.  Further details on both, my assessment(s), as well as the proposed treatment plan, please see below.  Post-Procedure Assessment  01/22/2017 Procedure: Diagnostic bilateral lumbar facet block #1 under fluoroscopic guidance and IV sedation Pre-procedure pain score:  3/10 Post-procedure pain score: 0/10 (100% relief) Influential Factors: BMI: 24.72 kg/m Intra-procedural challenges: None observed.         Assessment challenges: None detected.              Reported side-effects: None.        Post-procedural adverse reactions or complications: None reported         Sedation: Sedation provided. When no sedatives are used, the analgesic levels obtained are directly associated to the effectiveness of the local anesthetics. However, when sedation is provided, the level of analgesia obtained during the initial 1 hour following the intervention, is believed to be the result of a combination of factors. These factors may include, but are not limited to: 1. The effectiveness of the local anesthetics used. 2. The effects of the analgesic(s) and/or anxiolytic(s) used. 3. The degree of discomfort experienced by the patient at the time of the procedure. 4. The patients ability and reliability in recalling and recording the events. 5. The presence and influence of possible secondary gains and/or psychosocial factors. Reported result: Relief experienced during the 1st hour after the procedure: 100 % (Ultra-Short Term Relief) Ashley Rodriguez has indicated area to have been numb during this time. Interpretative annotation: Clinically appropriate result. Analgesia during this period is likely  to be Local Anesthetic and/or IV Sedative (Analgesic/Anxiolytic) related.           Effects of local anesthetic: The analgesic effects attained during this period are directly associated to the localized infiltration of local anesthetics and therefore cary significant diagnostic value as to the etiological location, or anatomical origin, of the pain. Expected duration of relief is directly dependent on the pharmacodynamics of the local anesthetic used. Long-acting (4-6 hours) anesthetics used.  Reported result: Relief during the next 4 to 6 hour after the procedure: 100 % (Short-Term Relief) Ashley Rodriguez has indicated area to have been numb during this time. Interpretative annotation: Clinically appropriate result. Analgesia during this period is likely to be Local Anesthetic-related.          Long-term benefit: Defined as the period of time past the expected duration of local anesthetics (1 hour for short-acting and 4-6 hours for long-acting). With the possible exception of prolonged sympathetic blockade from the local anesthetics, benefits during this period are typically attributed to, or associated with, other factors such as analgesic sensory neuropraxia, antiinflammatory effects, or beneficial biochemical changes provided by agents other than the local anesthetics.  Reported result: Extended relief following procedure: 90 % (ongoing) (Long-Term Relief) Ashley Rodriguez reports the axial pain improved more than the extremity pain. Interpretative annotation: Clinically appropriate result. Good relief. No permanent benefit expected. Inflammation plays a part in the etiology to the pain. Benefit believed to be steroid-related.  Current benefits: Defined as reported results that persistent at this point in time.   Analgesia: 90 % Ashley Rodriguez reports improvement of axial symptoms. Function: Ashley Rodriguez reports improvement in function ROM: Ashley Rodriguez reports improvement in ROM Interpretative annotation: Recurrence of symptoms. No permanent benefit expected. Effective diagnostic intervention.           Interpretation: Results would suggest Ashley Rodriguez to be a good candidate for Radiofrequency Ablation.                  Plan:  Set up procedure as a PRN palliative treatment option for this patient.       Laboratory Chemistry  Inflammation Markers (CRP: Acute Phase) (ESR: Chronic Phase) Lab Results  Component Value Date   CRP 0.8 01/03/2017   ESRSEDRATE 6 01/03/2017                 Renal Function Markers Lab Results  Component Value Date   BUN 9 01/03/2017   CREATININE 0.80 01/03/2017   GFRAA 90 01/03/2017   GFRNONAA 78 01/03/2017                 Hepatic Function Markers Lab Results  Component Value Date   AST 28 01/03/2017   ALT 24 01/03/2017   ALBUMIN 4.8 01/03/2017   ALKPHOS 55 01/03/2017                 Electrolytes Lab Results  Component Value Date   NA 140 01/03/2017   K 4.6 01/03/2017   CL 98 01/03/2017   CALCIUM 10.0 01/03/2017   MG 2.1 01/03/2017                 Neuropathy Markers Lab Results  Component Value Date   VITAMINB12 740 01/03/2017                 Bone Pathology Markers Lab Results  Component Value Date   ALKPHOS 55 01/03/2017   25OHVITD1 50 01/03/2017   25OHVITD2 <1.0 01/03/2017   25OHVITD3 50  01/03/2017   CALCIUM 10.0 01/03/2017                 Coagulation Parameters No results found for: INR, LABPROT, APTT, PLT               Cardiovascular Markers No results found for: BNP, HGB, HCT               Note: Lab results reviewed.  Recent Diagnostic Imaging Results  DG C-Arm 1-60 Min-No Report Fluoroscopy was utilized by the requesting physician.  No radiographic  interpretation.   Complexity Note: Imaging results reviewed. Results shared with Ms. Lequita Halt, using Layman's terms.                         Meds   Current Outpatient Prescriptions:  .  acetaminophen (TYLENOL) 325 MG tablet, Take 325 mg by mouth every 6 (six) hours as needed., Disp: , Rfl:  .  amLODipine (NORVASC) 5 MG tablet, Take 5 mg by mouth daily., Disp: ,  Rfl:  .  atenolol (TENORMIN) 100 MG tablet, Take 100 mg by mouth daily., Disp: , Rfl:  .  calcium carbonate (CALCIUM 600) 600 MG TABS tablet, Take 600 mg by mouth daily with breakfast., Disp: , Rfl:  .  etanercept (ENBREL) 50 MG/ML injection, Inject 50 mg into the skin once a week., Disp: , Rfl:  .  fexofenadine (ALLEGRA) 180 MG tablet, Take 180 mg by mouth daily., Disp: , Rfl:  .  fluticasone (FLONASE) 50 MCG/ACT nasal spray, , Disp: , Rfl: 4 .  meloxicam (MOBIC) 15 MG tablet, Take 15 mg by mouth daily. , Disp: , Rfl: 2 .  montelukast (SINGULAIR) 10 MG tablet, , Disp: , Rfl: 11 .  Multiple Vitamins-Minerals (MULTIVITAMIN WITH MINERALS) tablet, Take 1 tablet by mouth daily., Disp: , Rfl:  .  omeprazole (PRILOSEC) 40 MG capsule, , Disp: , Rfl: 2 .  predniSONE (DELTASONE) 5 MG tablet, Take 5 mg by mouth daily with breakfast., Disp: , Rfl:  .  valACYclovir (VALTREX) 500 MG tablet, Take 500 mg by mouth as needed., Disp: , Rfl:   ROS  Constitutional: Denies any fever or chills Gastrointestinal: No reported hemesis, hematochezia, vomiting, or acute GI distress Musculoskeletal: Denies any acute onset joint swelling, redness, loss of ROM, or weakness Neurological: No reported episodes of acute onset apraxia, aphasia, dysarthria, agnosia, amnesia, paralysis, loss of coordination, or loss of consciousness  Allergies  Ms. Peterkin is allergic to codeine and sulfa antibiotics.  PFSH  Drug: Ms. Knapper  reports that she does not use drugs. Alcohol:  reports that she drinks about 2.4 oz of alcohol per week . Tobacco:  reports that she has never smoked. She has never used smokeless tobacco. Medical:  has a past medical history of Arthritis; Flank lipoma (08/17/2016); Hypertension; and Neck mass (04/07/2013). Surgical: Ms. Eimer  has a past surgical history that includes Abdominal hysterectomy (2000); lipoma removed (04/2012); Colonoscopy (2015); and Neck mass excision (04/28/14). Family: family history is  not on file.  Constitutional Exam  General appearance: Well nourished, well developed, and well hydrated. In no apparent acute distress Vitals:   02/11/17 1351  BP: (!) 160/73  Pulse: (!) 58  Resp: 17  Temp: 97.7 F (36.5 C)  TempSrc: Oral  SpO2: 99%  Weight: 144 lb (65.3 kg)  Height: 5\' 4"  (1.626 m)   BMI Assessment: Estimated body mass index is 24.72 kg/m as calculated from the following:   Height  as of this encounter: '5\' 4"'$  (1.626 m).   Weight as of this encounter: 144 lb (65.3 kg).  BMI interpretation table: BMI level Category Range association with higher incidence of chronic pain  <18 kg/m2 Underweight   18.5-24.9 kg/m2 Ideal body weight   25-29.9 kg/m2 Overweight Increased incidence by 20%  30-34.9 kg/m2 Obese (Class I) Increased incidence by 68%  35-39.9 kg/m2 Severe obesity (Class II) Increased incidence by 136%  >40 kg/m2 Extreme obesity (Class III) Increased incidence by 254%   BMI Readings from Last 4 Encounters:  02/11/17 24.72 kg/m  01/22/17 24.72 kg/m  01/16/17 24.72 kg/m  01/03/17 24.25 kg/m   Wt Readings from Last 4 Encounters:  02/11/17 144 lb (65.3 kg)  01/22/17 144 lb (65.3 kg)  01/16/17 144 lb (65.3 kg)  01/03/17 143 lb 8 oz (65.1 kg)  Psych/Mental status: Alert, oriented x 3 (person, place, & time)       Eyes: PERLA Respiratory: No evidence of acute respiratory distress  Cervical Spine Area Exam  Skin & Axial Inspection: No masses, redness, edema, swelling, or associated skin lesions Alignment: Symmetrical Functional ROM: Unrestricted ROM      Stability: No instability detected Muscle Tone/Strength: Functionally intact. No obvious neuro-muscular anomalies detected. Sensory (Neurological): Unimpaired Palpation: No palpable anomalies              Upper Extremity (UE) Exam    Side: Right upper extremity  Side: Left upper extremity  Skin & Extremity Inspection: Skin color, temperature, and hair growth are WNL. No peripheral edema or  cyanosis. No masses, redness, swelling, asymmetry, or associated skin lesions. No contractures.  Skin & Extremity Inspection: Skin color, temperature, and hair growth are WNL. No peripheral edema or cyanosis. No masses, redness, swelling, asymmetry, or associated skin lesions. No contractures.  Functional ROM: Unrestricted ROM          Functional ROM: Unrestricted ROM          Muscle Tone/Strength: Functionally intact. No obvious neuro-muscular anomalies detected.  Muscle Tone/Strength: Functionally intact. No obvious neuro-muscular anomalies detected.  Sensory (Neurological): Unimpaired          Sensory (Neurological): Unimpaired          Palpation: No palpable anomalies              Palpation: No palpable anomalies              Specialized Test(s): Deferred         Specialized Test(s): Deferred          Thoracic Spine Area Exam  Skin & Axial Inspection: No masses, redness, or swelling Alignment: Symmetrical Functional ROM: Unrestricted ROM Stability: No instability detected Muscle Tone/Strength: Functionally intact. No obvious neuro-muscular anomalies detected. Sensory (Neurological): Unimpaired Muscle strength & Tone: No palpable anomalies  Lumbar Spine Area Exam  Skin & Axial Inspection: No masses, redness, or swelling Alignment: Symmetrical Functional ROM: Improved after treatment      Stability: No instability detected Muscle Tone/Strength: Functionally intact. No obvious neuro-muscular anomalies detected. Sensory (Neurological): Unimpaired Palpation: No palpable anomalies       Provocative Tests: Lumbar Hyperextension and rotation test: Improved after treatment       Lumbar Lateral bending test: evaluation deferred today       Patrick's Maneuver: evaluation deferred today                    Gait & Posture Assessment  Ambulation: Unassisted Gait: Relatively normal for age and body habitus  Posture: WNL   Lower Extremity Exam    Side: Right lower extremity  Side: Left lower  extremity  Skin & Extremity Inspection: Skin color, temperature, and hair growth are WNL. No peripheral edema or cyanosis. No masses, redness, swelling, asymmetry, or associated skin lesions. No contractures.  Skin & Extremity Inspection: Skin color, temperature, and hair growth are WNL. No peripheral edema or cyanosis. No masses, redness, swelling, asymmetry, or associated skin lesions. No contractures.  Functional ROM: Unrestricted ROM          Functional ROM: Unrestricted ROM          Muscle Tone/Strength: Functionally intact. No obvious neuro-muscular anomalies detected.  Muscle Tone/Strength: Functionally intact. No obvious neuro-muscular anomalies detected.  Sensory (Neurological): Unimpaired  Sensory (Neurological): Unimpaired  Palpation: No palpable anomalies  Palpation: No palpable anomalies   Assessment  Primary Diagnosis & Pertinent Problem List: The primary encounter diagnosis was Chronic low back pain (Primary Area of Pain) (Bilateral) (R>L). Diagnoses of Lumbar facet syndrome (Bilateral) (R>L), Chronic sacroiliac joint pain (Bilateral) (R>L), and Grade 1 Anterolisthesis of L5 over S1 were also pertinent to this visit.  Status Diagnosis  Improved Improved Controlled 1. Chronic low back pain (Primary Area of Pain) (Bilateral) (R>L)   2. Lumbar facet syndrome (Bilateral) (R>L)   3. Chronic sacroiliac joint pain (Bilateral) (R>L)   4. Grade 1 Anterolisthesis of L5 over S1     Problems updated and reviewed during this visit: No problems updated. Plan of Care  Pharmacotherapy (Medications Ordered): No orders of the defined types were placed in this encounter.  New Prescriptions   No medications on file   Medications administered today: Ms. Parkerson had no medications administered during this visit.   Procedure Orders     LUMBAR FACET(MEDIAL BRANCH NERVE BLOCK) [facility] Lab Orders  No laboratory test(s) ordered today   Imaging Orders  No imaging studies ordered today    Referral Orders  No referral(s) requested today    Interventional management options: Planned, scheduled, and/or pending:   Diagnostic bilateral lumbar facet block #2 under fluoroscopic guidance and IV sedation   Considering:   Diagnostic bilateral lumbar facet block  Possible bilateral lumbar facet RFA  Diagnostic bilateral sacroiliac joint block  Possible bilateral sacroiliac joint RFA  Diagnostic bilateral hip joint injection  Possible bilateral femoral nerve + obturator nerve block  Possible bilateral femoral nerve + obturator nerve RFA  Diagnostic right-sided L4-5 lumbar epidural steroid injection    Palliative PRN treatment(s):   Diagnostic bilateral lumbar facet block #2 under fluoroscopic guidance and IV sedation   Provider-requested follow-up: Return if symptoms worsen or fail to improve, for PRN Procedure: (B) L-FCT Blk.  No future appointments. Primary Care Physician: Gaynelle Arabian, MD Location: [facility] [place] [facility] Note by: Gaspar Cola, MD Date: 02/11/2017; Time: 2:58 PM

## 2017-02-11 ENCOUNTER — Encounter: Payer: Self-pay | Admitting: Pain Medicine

## 2017-02-11 ENCOUNTER — Ambulatory Visit: Payer: BLUE CROSS/BLUE SHIELD | Attending: Pain Medicine | Admitting: Pain Medicine

## 2017-02-11 VITALS — BP 160/73 | HR 58 | Temp 97.7°F | Resp 17 | Ht 64.0 in | Wt 144.0 lb

## 2017-02-11 DIAGNOSIS — Z791 Long term (current) use of non-steroidal anti-inflammatories (NSAID): Secondary | ICD-10-CM | POA: Insufficient documentation

## 2017-02-11 DIAGNOSIS — L4050 Arthropathic psoriasis, unspecified: Secondary | ICD-10-CM | POA: Insufficient documentation

## 2017-02-11 DIAGNOSIS — M47816 Spondylosis without myelopathy or radiculopathy, lumbar region: Secondary | ICD-10-CM

## 2017-02-11 DIAGNOSIS — M533 Sacrococcygeal disorders, not elsewhere classified: Secondary | ICD-10-CM | POA: Insufficient documentation

## 2017-02-11 DIAGNOSIS — M4310 Spondylolisthesis, site unspecified: Secondary | ICD-10-CM

## 2017-02-11 DIAGNOSIS — R200 Anesthesia of skin: Secondary | ICD-10-CM | POA: Insufficient documentation

## 2017-02-11 DIAGNOSIS — G8929 Other chronic pain: Secondary | ICD-10-CM

## 2017-02-11 DIAGNOSIS — Z79899 Other long term (current) drug therapy: Secondary | ICD-10-CM | POA: Diagnosis not present

## 2017-02-11 DIAGNOSIS — M79605 Pain in left leg: Secondary | ICD-10-CM | POA: Insufficient documentation

## 2017-02-11 DIAGNOSIS — Z885 Allergy status to narcotic agent status: Secondary | ICD-10-CM | POA: Insufficient documentation

## 2017-02-11 DIAGNOSIS — M5441 Lumbago with sciatica, right side: Secondary | ICD-10-CM

## 2017-02-11 DIAGNOSIS — M5442 Lumbago with sciatica, left side: Secondary | ICD-10-CM

## 2017-02-11 DIAGNOSIS — G894 Chronic pain syndrome: Secondary | ICD-10-CM | POA: Insufficient documentation

## 2017-02-11 DIAGNOSIS — M4316 Spondylolisthesis, lumbar region: Secondary | ICD-10-CM | POA: Diagnosis not present

## 2017-02-11 DIAGNOSIS — M5136 Other intervertebral disc degeneration, lumbar region: Secondary | ICD-10-CM | POA: Insufficient documentation

## 2017-02-11 DIAGNOSIS — M7918 Myalgia, other site: Secondary | ICD-10-CM | POA: Insufficient documentation

## 2017-02-11 NOTE — Progress Notes (Signed)
Safety precautions to be maintained throughout the outpatient stay will include: orient to surroundings, keep bed in low position, maintain call bell within reach at all times, provide assistance with transfer out of bed and ambulation.  

## 2017-02-11 NOTE — Patient Instructions (Signed)

## 2017-02-26 DIAGNOSIS — E7800 Pure hypercholesterolemia, unspecified: Secondary | ICD-10-CM | POA: Diagnosis not present

## 2017-02-26 DIAGNOSIS — K219 Gastro-esophageal reflux disease without esophagitis: Secondary | ICD-10-CM | POA: Diagnosis not present

## 2017-02-26 DIAGNOSIS — I10 Essential (primary) hypertension: Secondary | ICD-10-CM | POA: Diagnosis not present

## 2017-02-26 DIAGNOSIS — Z23 Encounter for immunization: Secondary | ICD-10-CM | POA: Diagnosis not present

## 2017-02-26 DIAGNOSIS — E119 Type 2 diabetes mellitus without complications: Secondary | ICD-10-CM | POA: Diagnosis not present

## 2017-02-26 DIAGNOSIS — G629 Polyneuropathy, unspecified: Secondary | ICD-10-CM | POA: Diagnosis not present

## 2017-02-26 LAB — TSH: TSH: 3.55 (ref 0.41–5.90)

## 2017-02-26 LAB — CBC AND DIFFERENTIAL
HCT: 45 (ref 36–46)
Hemoglobin: 15.1 (ref 12.0–16.0)
Platelets: 336 (ref 150–399)
WBC: 5

## 2017-02-26 LAB — CBC: RBC: 4.59 (ref 3.87–5.11)

## 2017-02-28 ENCOUNTER — Other Ambulatory Visit: Payer: Self-pay | Admitting: Family Medicine

## 2017-02-28 DIAGNOSIS — Z1231 Encounter for screening mammogram for malignant neoplasm of breast: Secondary | ICD-10-CM

## 2017-03-14 ENCOUNTER — Ambulatory Visit
Admission: RE | Admit: 2017-03-14 | Discharge: 2017-03-14 | Disposition: A | Discharge status: Home / Self Care | Payer: BLUE CROSS/BLUE SHIELD | Source: Ambulatory Visit | Attending: Family Medicine | Admitting: Family Medicine

## 2017-03-14 DIAGNOSIS — Z1231 Encounter for screening mammogram for malignant neoplasm of breast: Secondary | ICD-10-CM | POA: Diagnosis not present

## 2017-04-16 DIAGNOSIS — L409 Psoriasis, unspecified: Secondary | ICD-10-CM | POA: Diagnosis not present

## 2017-04-16 DIAGNOSIS — M2550 Pain in unspecified joint: Secondary | ICD-10-CM | POA: Diagnosis not present

## 2017-04-16 DIAGNOSIS — L4050 Arthropathic psoriasis, unspecified: Secondary | ICD-10-CM | POA: Diagnosis not present

## 2017-04-16 DIAGNOSIS — M545 Low back pain: Secondary | ICD-10-CM | POA: Diagnosis not present

## 2017-04-18 ENCOUNTER — Ambulatory Visit (HOSPITAL_BASED_OUTPATIENT_CLINIC_OR_DEPARTMENT_OTHER): Payer: BLUE CROSS/BLUE SHIELD | Admitting: Pain Medicine

## 2017-04-18 ENCOUNTER — Other Ambulatory Visit: Payer: Self-pay

## 2017-04-18 ENCOUNTER — Ambulatory Visit
Admission: RE | Admit: 2017-04-18 | Discharge: 2017-04-18 | Disposition: A | Discharge status: Home / Self Care | Payer: BLUE CROSS/BLUE SHIELD | Source: Ambulatory Visit | Attending: Pain Medicine | Admitting: Pain Medicine

## 2017-04-18 ENCOUNTER — Encounter: Payer: Self-pay | Admitting: Pain Medicine

## 2017-04-18 VITALS — BP 118/73 | HR 55 | Temp 96.6°F | Resp 13 | Ht 64.0 in | Wt 144.0 lb

## 2017-04-18 DIAGNOSIS — M545 Low back pain: Secondary | ICD-10-CM | POA: Diagnosis present

## 2017-04-18 DIAGNOSIS — M5442 Lumbago with sciatica, left side: Secondary | ICD-10-CM | POA: Diagnosis not present

## 2017-04-18 DIAGNOSIS — F419 Anxiety disorder, unspecified: Secondary | ICD-10-CM | POA: Diagnosis not present

## 2017-04-18 DIAGNOSIS — Z79899 Other long term (current) drug therapy: Secondary | ICD-10-CM | POA: Diagnosis not present

## 2017-04-18 DIAGNOSIS — Z7951 Long term (current) use of inhaled steroids: Secondary | ICD-10-CM | POA: Diagnosis not present

## 2017-04-18 DIAGNOSIS — Z885 Allergy status to narcotic agent status: Secondary | ICD-10-CM | POA: Diagnosis not present

## 2017-04-18 DIAGNOSIS — M5441 Lumbago with sciatica, right side: Secondary | ICD-10-CM

## 2017-04-18 DIAGNOSIS — M47816 Spondylosis without myelopathy or radiculopathy, lumbar region: Secondary | ICD-10-CM | POA: Insufficient documentation

## 2017-04-18 DIAGNOSIS — Z90710 Acquired absence of both cervix and uterus: Secondary | ICD-10-CM | POA: Insufficient documentation

## 2017-04-18 DIAGNOSIS — M488X6 Other specified spondylopathies, lumbar region: Secondary | ICD-10-CM | POA: Diagnosis not present

## 2017-04-18 DIAGNOSIS — G8929 Other chronic pain: Secondary | ICD-10-CM

## 2017-04-18 DIAGNOSIS — Z791 Long term (current) use of non-steroidal anti-inflammatories (NSAID): Secondary | ICD-10-CM | POA: Diagnosis not present

## 2017-04-18 DIAGNOSIS — Z882 Allergy status to sulfonamides status: Secondary | ICD-10-CM | POA: Diagnosis not present

## 2017-04-18 MED ORDER — LIDOCAINE HCL 2 % IJ SOLN
INTRAMUSCULAR | Status: AC
  Filled 2017-04-18: qty 20

## 2017-04-18 MED ORDER — FENTANYL CITRATE (PF) 100 MCG/2ML IJ SOLN
25.0000 ug | INTRAMUSCULAR | Status: DC | PRN
Start: 2017-04-18 — End: 2017-04-18
  Administered 2017-04-18: 50 ug via INTRAVENOUS

## 2017-04-18 MED ORDER — ROPIVACAINE HCL 2 MG/ML IJ SOLN
9.0000 mL | Freq: Once | INTRAMUSCULAR | Status: AC
  Administered 2017-04-18: 9 mL via PERINEURAL

## 2017-04-18 MED ORDER — MIDAZOLAM HCL 5 MG/5ML IJ SOLN
1.0000 mg | INTRAMUSCULAR | Status: DC | PRN
  Administered 2017-04-18: 2 mg via INTRAVENOUS

## 2017-04-18 MED ORDER — LACTATED RINGERS IV SOLN
1000.0000 mL | Freq: Once | INTRAVENOUS | Status: AC
  Administered 2017-04-18: 1000 mL via INTRAVENOUS

## 2017-04-18 MED ORDER — ROPIVACAINE HCL 2 MG/ML IJ SOLN
INTRAMUSCULAR | Status: AC
  Filled 2017-04-18: qty 20

## 2017-04-18 MED ORDER — TRIAMCINOLONE ACETONIDE 40 MG/ML IJ SUSP
40.0000 mg | Freq: Once | INTRAMUSCULAR | Status: AC
  Administered 2017-04-18: 40 mg

## 2017-04-18 MED ORDER — ROPIVACAINE HCL 2 MG/ML IJ SOLN
9.0000 mL | Freq: Once | INTRAMUSCULAR | Status: AC
  Administered 2017-04-18: 10 mL via PERINEURAL

## 2017-04-18 MED ORDER — FENTANYL CITRATE (PF) 100 MCG/2ML IJ SOLN
INTRAMUSCULAR | Status: AC
  Filled 2017-04-18: qty 2

## 2017-04-18 MED ORDER — LIDOCAINE HCL 2 % IJ SOLN
10.0000 mL | Freq: Once | INTRAMUSCULAR | Status: AC
  Administered 2017-04-18: 200 mg

## 2017-04-18 MED ORDER — TRIAMCINOLONE ACETONIDE 40 MG/ML IJ SUSP
INTRAMUSCULAR | Status: AC
  Filled 2017-04-18: qty 2

## 2017-04-18 MED ORDER — MIDAZOLAM HCL 5 MG/5ML IJ SOLN
INTRAMUSCULAR | Status: AC
  Filled 2017-04-18: qty 5

## 2017-04-18 NOTE — Progress Notes (Signed)
Patient's Name: Ashley Rodriguez  MRN: [id number]  Referring Provider: Gaynelle Arabian, MD  DOB: March 16, 1951  PCP: Gaynelle Arabian, MD  DOS: 04/18/2017  Note by: Gaspar Cola, MD  Service setting: Ambulatory outpatient  Specialty: Interventional Pain Management  Patient type: Established  Location: [facility] (AMB) Pain Management Facility  Visit type: Interventional Procedure   Primary Reason for Visit: Interventional Pain Management Treatment. CC: No chief complaint on file.  Procedure:  Anesthesia, Analgesia, Anxiolysis:  Type: Diagnostic Medial Branch Facet Block #2 Region: Lumbar Level: L2, L3, L4, L5, & S1 Medial Branch Level(s) Laterality: Bilateral  Type: Local Anesthesia with Moderate (Conscious) Sedation Local Anesthetic: Lidocaine 1% Route: Intravenous (IV) IV Access: Secured Sedation: Meaningful verbal contact was maintained at all times during the procedure  Indication(s): Analgesia and Anxiety   Indications: 1. Lumbar facet syndrome (Bilateral) (R>L)   2. Osteoarthritis of lumbar spine   3. Chronic low back pain (Primary Area of Pain) (Bilateral) (R>L)   4. Facet syndrome, lumbar   5. Primary osteoarthritis of lumbar spine   6. Chronic bilateral low back pain with bilateral sciatica    Pain Score: Pre-procedure: 5 /10 Post-procedure: 0-No pain/10  Pre-op Assessment:  Ashley Rodriguez is a 66 y.o. (year old), female patient, seen today for interventional treatment. She  has a past surgical history that includes Abdominal hysterectomy (2000); lipoma removed (04/2012); Colonoscopy (2015); and Neck mass excision (04/28/14). [name] has a current medication list which includes the following prescription(s): acetaminophen, amlodipine, atenolol, calcium carbonate, etanercept, fexofenadine, fluticasone, meloxicam, montelukast, multivitamin with minerals, omeprazole, prednisone, and valacyclovir, and the following Facility-Administered Medications: fentanyl and midazolam. Her primarily  concern today is the No chief complaint on file.  Initial Vital Signs: There were no vitals taken for this visit. BMI: Estimated body mass index is 24.72 kg/m as calculated from the following:   Height as of this encounter: 5\' 4"  (1.626 m).   Weight as of this encounter: 144 lb (65.3 kg).  Risk Assessment: Allergies: Reviewed. She is allergic to codeine and sulfa antibiotics.  Allergy Precautions: None required Coagulopathies: Reviewed. None identified.  Blood-thinner therapy: None at this time Active Infection(s): Reviewed. None identified. Ashley Rodriguez is afebrile  Site Confirmation: Ashley Rodriguez was asked to confirm the procedure and laterality before marking the site Procedure checklist: Completed Consent: Before the procedure and under the influence of no sedative(s), amnesic(s), or anxiolytics, the patient was informed of the treatment options, risks and possible complications. To fulfill our ethical and legal obligations, as recommended by the American Medical Association's Code of Ethics, I have informed the patient of my clinical impression; the nature and purpose of the treatment or procedure; the risks, benefits, and possible complications of the intervention; the alternatives, including doing nothing; the risk(s) and benefit(s) of the alternative treatment(s) or procedure(s); and the risk(s) and benefit(s) of doing nothing. The patient was provided information about the general risks and possible complications associated with the procedure. These may include, but are not limited to: failure to achieve desired goals, infection, bleeding, organ or nerve damage, allergic reactions, paralysis, and death. In addition, the patient was informed of those risks and complications associated to Spine-related procedures, such as failure to decrease pain; infection (i.e.: Meningitis, epidural or intraspinal abscess); bleeding (i.e.: epidural hematoma, subarachnoid hemorrhage, or any other type of  intraspinal or peri-dural bleeding); organ or nerve damage (i.e.: Any type of peripheral nerve, nerve root, or spinal cord injury) with subsequent damage to sensory, motor, and/or autonomic systems, resulting in  permanent pain, numbness, and/or weakness of one or several areas of the body; allergic reactions; (i.e.: anaphylactic reaction); and/or death. Furthermore, the patient was informed of those risks and complications associated with the medications. These include, but are not limited to: allergic reactions (i.e.: anaphylactic or anaphylactoid reaction(s)); adrenal axis suppression; blood sugar elevation that in diabetics may result in ketoacidosis or comma; water retention that in patients with history of congestive heart failure may result in shortness of breath, pulmonary edema, and decompensation with resultant heart failure; weight gain; swelling or edema; medication-induced neural toxicity; particulate matter embolism and blood vessel occlusion with resultant organ, and/or nervous system infarction; and/or aseptic necrosis of one or more joints. Finally, the patient was informed that Medicine is not an exact science; therefore, there is also the possibility of unforeseen or unpredictable risks and/or possible complications that may result in a catastrophic outcome. The patient indicated having understood very clearly. We have given the patient no guarantees and we have made no promises. Enough time was given to the patient to ask questions, all of which were answered to the patient's satisfaction. Ashley Rodriguez has indicated that she wanted to continue with the procedure. Attestation: I, the ordering provider, attest that I have discussed with the patient the benefits, risks, side-effects, alternatives, likelihood of achieving goals, and potential problems during recovery for the procedure that I have provided informed consent. Date: 04/18/2017; Time: 7:52 AM  Pre-Procedure Preparation:  Monitoring:  As per clinic protocol. Respiration, ETCO2, SpO2, BP, heart rate and rhythm monitor placed and checked for adequate function Safety Precautions: Patient was assessed for positional comfort and pressure points before starting the procedure. Time-out: I initiated and conducted the "Time-out" before starting the procedure, as per protocol. The patient was asked to participate by confirming the accuracy of the "Time Out" information. Verification of the correct person, site, and procedure were performed and confirmed by me, the nursing staff, and the patient. "Time-out" conducted as per Joint Commission's Universal Protocol (UP.01.01.01). "Time-out" Date & Time: 04/18/2017; 1011 hrs.  Description of Procedure Process:   Position: Prone Target Area: For Lumbar Facet blocks, the target is the groove formed by the junction of the transverse process and superior articular process. For the L5 dorsal ramus, the target is the notch between superior articular process and sacral ala. For the S1 dorsal ramus, the target is the superior and lateral edge of the posterior S1 Sacral foramen. Approach: Paramedial approach. Area Prepped: Entire Posterior Lumbosacral Region Prepping solution: ChloraPrep (2% chlorhexidine gluconate and 70% isopropyl alcohol) Safety Precautions: Aspiration looking for blood return was conducted prior to all injections. At no point did we inject any substances, as a needle was being advanced. No attempts were made at seeking any paresthesias. Safe injection practices and needle disposal techniques used. Medications properly checked for expiration dates. SDV (single dose vial) medications used. Description of the Procedure: Protocol guidelines were followed. The patient was placed in position over the fluoroscopy table. The target area was identified and the area prepped in the usual manner. Skin desensitized using vapocoolant spray. Skin & deeper tissues infiltrated with local anesthetic.  Appropriate amount of time allowed to pass for local anesthetics to take effect. The procedure needle was introduced through the skin, ipsilateral to the reported pain, and advanced to the target area. Employing the "Medial Branch Technique", the needles were advanced to the angle made by the superior and medial portion of the transverse process, and the lateral and inferior portion of the superior articulating  process of the targeted vertebral bodies. This area is known as "Burton's Eye" or the "Eye of the [place] Dog". A procedure needle was introduced through the skin, and this time advanced to the angle made by the superior and medial border of the sacral ala, and the lateral border of the S1 vertebral body. This last needle was later repositioned at the superior and lateral border of the posterior S1 foramen. Negative aspiration confirmed. Solution injected in intermittent fashion, asking for systemic symptoms every 0.5cc of injectate. The needles were then removed and the area cleansed, making sure to leave some of the prepping solution back to take advantage of its long term bactericidal properties.   Illustration of the posterior view of the lumbar spine and the posterior neural structures. Laminae of L2 through S1 are labeled. DPRL5, dorsal primary ramus of L5; DPRS1, dorsal primary ramus of S1; DPR3, dorsal primary ramus of L3; FJ, facet (zygapophyseal) joint L3-L4; I, inferior articular process of L4; LB1, lateral branch of dorsal primary ramus of L1; IAB, inferior articular branches from L3 medial branch (supplies L4-L5 facet joint); IBP, intermediate branch plexus; MB3, medial branch of dorsal primary ramus of L3; NR3, third lumbar nerve root; S, superior articular process of L5; SAB, superior articular branches from L4 (supplies L4-5 facet joint also); TP3, transverse process of L3.  Vitals:   04/18/17 1025 04/18/17 1031 04/18/17 1041 04/18/17 1051  BP: 124/67 134/74 121/69 118/73  Pulse: (!)  55     Resp: 13 12 12 13   Temp:  (!) 96.6 F (35.9 C)    TempSrc:  Temporal    SpO2: 96% 92% 92% 93%  Weight:      Height:        Start Time: 1011 hrs. End Time: 1022 hrs. Materials:  Needle(s) Type: Regular needle Gauge: 22G Length: 3.5-in Medication(s): We administered lidocaine, triamcinolone acetonide, ropivacaine (PF) 2 mg/mL (0.2%), triamcinolone acetonide, ropivacaine (PF) 2 mg/mL (0.2%), lactated ringers, midazolam, and fentaNYL. Please see chart orders for dosing details.  Imaging Guidance (Spinal):  Type of Imaging Technique: Fluoroscopy Guidance (Spinal) Indication(s): Assistance in needle guidance and placement for procedures requiring needle placement in or near specific anatomical locations not easily accessible without such assistance. Exposure Time: Please see nurses notes. Contrast: None used. Fluoroscopic Guidance: I was personally present during the use of fluoroscopy. "Tunnel Vision Technique" used to obtain the best possible view of the target area. Parallax error corrected before commencing the procedure. "Direction-depth-direction" technique used to introduce the needle under continuous pulsed fluoroscopy. Once target was reached, antero-posterior, oblique, and lateral fluoroscopic projection used confirm needle placement in all planes. Images permanently stored in EMR. Interpretation: No contrast injected. I personally interpreted the imaging intraoperatively. Adequate needle placement confirmed in multiple planes. Permanent images saved into the patient's record.  Antibiotic Prophylaxis:  Indication(s): None identified Antibiotic given: None  Post-operative Assessment:  EBL: None Complications: No immediate post-treatment complications observed by team, or reported by patient. Note: The patient tolerated the entire procedure well. A repeat set of vitals were taken after the procedure and the patient was kept under observation following institutional policy,  for this type of procedure. Post-procedural neurological assessment was performed, showing return to baseline, prior to discharge. The patient was provided with post-procedure discharge instructions, including a section on how to identify potential problems. Should any problems arise concerning this procedure, the patient was given instructions to immediately contact us, at any time, without hesitation. In any case, we plan to contact the  patient by telephone for a follow-up status report regarding this interventional procedure. Comments:  No additional relevant information.  Plan of Care    Imaging Orders     DG C-Arm 1-60 Min-No Report  Procedure Orders     LUMBAR FACET(MEDIAL BRANCH NERVE BLOCK) MBNB  Medications ordered for procedure: Meds ordered this encounter  Medications  . lidocaine (XYLOCAINE) 2 % (with pres) injection 200 mg  . triamcinolone acetonide (KENALOG-40) injection 40 mg  . ropivacaine (PF) 2 mg/mL (0.2%) (NAROPIN) injection 9 mL  . triamcinolone acetonide (KENALOG-40) injection 40 mg  . ropivacaine (PF) 2 mg/mL (0.2%) (NAROPIN) injection 9 mL  . lactated ringers infusion 1,000 mL  . midazolam (VERSED) 5 MG/5ML injection 1-2 mg    Make sure Flumazenil is available in the pyxis when using this medication. If oversedation occurs, administer 0.2 mg IV over 15 sec. If after 45 sec no response, administer 0.2 mg again over 1 min; may repeat at 1 min intervals; not to exceed 4 doses (1 mg)  . fentaNYL (SUBLIMAZE) injection 25-50 mcg    Make sure Narcan is available in the pyxis when using this medication. In the event of respiratory depression (RR< 8/min): Titrate NARCAN (naloxone) in increments of 0.1 to 0.2 mg IV at 2-3 minute intervals, until desired degree of reversal.   Medications administered: We administered lidocaine, triamcinolone acetonide, ropivacaine (PF) 2 mg/mL (0.2%), triamcinolone acetonide, ropivacaine (PF) 2 mg/mL (0.2%), lactated ringers, midazolam, and  fentaNYL.  See the medical record for exact dosing, route, and time of administration.  This SmartLink is deprecated. Use AVSMEDLIST instead to display the medication list for a patient. Disposition: Discharge home  Discharge Date & Time: 04/18/2017; 1054 hrs.   Physician-requested Follow-up: Return for post-procedure eval (2 wks). Future Appointments  Date Time Provider [place]  05/06/2017  9:15 AM Milinda Pointer, MD [facility] None   Primary Care Physician: Gaynelle Arabian, MD Location: [facility] Note by: Gaspar Cola, MD Date: 04/18/2017; Time: 1:07 PM  Disclaimer:  Medicine is not an Chief Strategy Officer. The only guarantee in medicine is that nothing is guaranteed. It is important to note that the decision to proceed with this intervention was based on the information collected from the patient. The Data and conclusions were drawn from the patient's questionnaire, the interview, and the physical examination. Because the information was provided in large part by the patient, it cannot be guaranteed that it has not been purposely or unconsciously manipulated. Every effort has been made to obtain as much relevant data as possible for this evaluation. It is important to note that the conclusions that lead to this procedure are derived in large part from the available data. Always take into account that the treatment will also be dependent on availability of resources and existing treatment guidelines, considered by other Pain Management Practitioners as being common knowledge and practice, at the time of the intervention. For Medico-Legal purposes, it is also important to point out that variation in procedural techniques and pharmacological choices are the acceptable norm. The indications, contraindications, technique, and results of the above procedure should only be interpreted and judged by a Board-Certified Interventional Pain Specialist with extensive  familiarity and expertise in the same exact procedure and technique.

## 2017-04-18 NOTE — Patient Instructions (Signed)

## 2017-04-19 ENCOUNTER — Telehealth: Payer: Self-pay

## 2017-04-19 NOTE — Telephone Encounter (Signed)
Post procedure phone call. Patient states she is doing good.  

## 2017-04-25 DIAGNOSIS — Z808 Family history of malignant neoplasm of other organs or systems: Secondary | ICD-10-CM | POA: Diagnosis not present

## 2017-04-25 DIAGNOSIS — D1801 Hemangioma of skin and subcutaneous tissue: Secondary | ICD-10-CM | POA: Diagnosis not present

## 2017-04-25 DIAGNOSIS — Z1283 Encounter for screening for malignant neoplasm of skin: Secondary | ICD-10-CM | POA: Diagnosis not present

## 2017-04-25 DIAGNOSIS — Z85828 Personal history of other malignant neoplasm of skin: Secondary | ICD-10-CM | POA: Diagnosis not present

## 2017-04-29 DIAGNOSIS — H5203 Hypermetropia, bilateral: Secondary | ICD-10-CM | POA: Diagnosis not present

## 2017-04-29 LAB — HM DIABETES EYE EXAM

## 2017-05-05 NOTE — Progress Notes (Signed)
Patient's Name: Ashley Rodriguez  MRN: [id number]  Referring Provider: Gaynelle Arabian, MD  DOB: 1950-07-01  PCP: Gaynelle Arabian, MD  DOS: 05/06/2017  Note by: Gaspar Cola, MD  Service setting: Ambulatory outpatient  Specialty: Interventional Pain Management  Location: [facility] (AMB) Pain Management Facility    Patient type: Established   Primary Reason(s) for Visit: Encounter for post-procedure evaluation of chronic illness with mild to moderate exacerbation CC: Back Pain (lower)  HPI  Ashley Rodriguez is a 67 y.o. year old, female patient, who comes today for a post-procedure evaluation. She has Psoriatic arthritis ([place]); Chronic low back pain (Primary Area of Pain) (Bilateral) (R>L); Chronic lower extremity pain (Secondary Area of Pain) (Bilateral) (R>L); Grade 1 Anterolisthesis of L5 over S1; L5 pars defect with spondylolisthesis (Bilateral); NSAID long-term use; Disorder of skeletal system; Pharmacologic therapy; Problems influencing health status; DDD (degenerative disc disease), lumbar; Lumbar facet syndrome (Bilateral) (R>L); Numbness and tingling (lower extremities) (Bilateral); Chronic pain syndrome; Chronic musculoskeletal pain; Neurogenic pain; Chronic sacroiliac joint pain (Bilateral) (R>L); and Osteoarthritis of lumbar spine on their problem list. Her primarily concern today is the Back Pain (lower)  Pain Assessment: Location: Lower Back Radiating: not radiating today since procedure Duration: Chronic pain Quality: Aching, Discomfort Severity: 0-No pain/10 (self-reported pain score)  Note: Reported level is compatible with observation.                               Effect on ADL: doing well now Modifying factors: projcedure, heat  Ashley Rodriguez comes in today for post-procedure evaluation after the treatment done on 04/18/2017.  Further details on both, my assessment(s), as well as the proposed treatment plan, please see below.  Post-Procedure Assessment  04/18/2017 Procedure:  Diagnostic Bilateral Lumbar Facet Block #2, under fluoro & IV sedation Pre-procedure pain score:  5/10 Post-procedure pain score: 0/10 (100% relief) Influential Factors: BMI: 25.06 kg/m Intra-procedural challenges: None observed.         Assessment challenges: None detected.              Reported side-effects: None.        Post-procedural adverse reactions or complications: None reported         Sedation: Sedation provided. When no sedatives are used, the analgesic levels obtained are directly associated to the effectiveness of the local anesthetics. However, when sedation is provided, the level of analgesia obtained during the initial 1 hour following the intervention, is believed to be the result of a combination of factors. These factors may include, but are not limited to: 1. The effectiveness of the local anesthetics used. 2. The effects of the analgesic(s) and/or anxiolytic(s) used. 3. The degree of discomfort experienced by the patient at the time of the procedure. 4. The patients ability and reliability in recalling and recording the events. 5. The presence and influence of possible secondary gains and/or psychosocial factors. Reported result: Relief experienced during the 1st hour after the procedure: 100 % (Ultra-Short Term Relief)            Interpretative annotation: Clinically appropriate result. Analgesia during this period is likely to be Local Anesthetic and/or IV Sedative (Analgesic/Anxiolytic) related.          Effects of local anesthetic: The analgesic effects attained during this period are directly associated to the localized infiltration of local anesthetics and therefore cary significant diagnostic value as to the etiological location, or anatomical origin, of the pain. Expected duration of  relief is directly dependent on the pharmacodynamics of the local anesthetic used. Long-acting (4-6 hours) anesthetics used.  Reported result: Relief during the next 4 to 6 hour after  the procedure: 90 % (Short-Term Relief)            Interpretative annotation: Clinically appropriate result. Analgesia during this period is likely to be Local Anesthetic-related.          Long-term benefit: Defined as the period of time past the expected duration of local anesthetics (1 hour for short-acting and 4-6 hours for long-acting). With the possible exception of prolonged sympathetic blockade from the local anesthetics, benefits during this period are typically attributed to, or associated with, other factors such as analgesic sensory neuropraxia, antiinflammatory effects, or beneficial biochemical changes provided by agents other than the local anesthetics.  Reported result: Extended relief following procedure: 100 % (Long-Term Relief)            Interpretative annotation: Clinically appropriate result. Good relief. No permanent benefit expected. Inflammation plays a part in the etiology to the pain.          Current benefits: Defined as reported results that persistent at this point in time.   Analgesia: 100 % Ashley Rodriguez reports that both, extremity and the axial pain improved with the treatment. Function: Ashley Rodriguez reports improvement in function ROM: Ashley Rodriguez reports improvement in ROM Interpretative annotation: Recurrence of symptoms. No permanent benefit expected. Effective diagnostic intervention.          Interpretation: Results would suggest a successful diagnostic intervention.                  Plan:  Set up procedure as a PRN palliative treatment option for this patient.        Laboratory Chemistry  Inflammation Markers (CRP: Acute Phase) (ESR: Chronic Phase) Lab Results  Component Value Date   CRP 0.8 01/03/2017   ESRSEDRATE 6 01/03/2017                 Renal Function Markers Lab Results  Component Value Date   BUN 9 01/03/2017   CREATININE 0.80 01/03/2017   GFRAA 90 01/03/2017   GFRNONAA 78 01/03/2017                 Hepatic Function Markers Lab Results   Component Value Date   AST 28 01/03/2017   ALT 24 01/03/2017   ALBUMIN 4.8 01/03/2017   ALKPHOS 55 01/03/2017                 Electrolytes Lab Results  Component Value Date   NA 140 01/03/2017   K 4.6 01/03/2017   CL 98 01/03/2017   CALCIUM 10.0 01/03/2017   MG 2.1 01/03/2017                 Neuropathy Markers Lab Results  Component Value Date   VITAMINB12 740 01/03/2017                 Bone Pathology Markers Lab Results  Component Value Date   25OHVITD1 50 01/03/2017   25OHVITD2 <1.0 01/03/2017   25OHVITD3 50 01/03/2017                 Note: Lab results reviewed.  Recent Diagnostic Imaging Results  DG C-Arm 1-60 Min-No Report Fluoroscopy was utilized by the requesting physician.  No radiographic  interpretation.   Complexity Note: Imaging results reviewed. Results shared with Ashley Rodriguez, using Layman's terms.  Meds   Current Outpatient Medications:  .  acetaminophen (TYLENOL) 325 MG tablet, Take 325 mg by mouth every 6 (six) hours as needed., Disp: , Rfl:  .  amLODipine (NORVASC) 5 MG tablet, Take 5 mg by mouth daily., Disp: , Rfl:  .  atenolol (TENORMIN) 100 MG tablet, Take 100 mg by mouth daily., Disp: , Rfl:  .  atorvastatin (LIPITOR) 10 MG tablet, Take 1 tablet by mouth daily., Disp: , Rfl: 0 .  calcium carbonate (CALCIUM 600) 600 MG TABS tablet, Take 600 mg by mouth daily with breakfast., Disp: , Rfl:  .  etanercept (ENBREL) 50 MG/ML injection, Inject 50 mg into the skin once a week., Disp: , Rfl:  .  fexofenadine (ALLEGRA) 180 MG tablet, Take 180 mg by mouth daily., Disp: , Rfl:  .  fluticasone (FLONASE) 50 MCG/ACT nasal spray, , Disp: , Rfl: 4 .  meloxicam (MOBIC) 15 MG tablet, Take 15 mg by mouth daily. , Disp: , Rfl: 2 .  montelukast (SINGULAIR) 10 MG tablet, , Disp: , Rfl: 11 .  Multiple Vitamins-Minerals (MULTIVITAMIN WITH MINERALS) tablet, Take 1 tablet by mouth daily., Disp: , Rfl:  .  omeprazole (PRILOSEC) 40 MG capsule, ,  Disp: , Rfl: 2 .  predniSONE (DELTASONE) 5 MG tablet, Take 5 mg by mouth daily with breakfast., Disp: , Rfl:  .  valACYclovir (VALTREX) 500 MG tablet, Take 500 mg by mouth as needed., Disp: , Rfl:   ROS  Constitutional: Denies any fever or chills Gastrointestinal: No reported hemesis, hematochezia, vomiting, or acute GI distress Musculoskeletal: Denies any acute onset joint swelling, redness, loss of ROM, or weakness Neurological: No reported episodes of acute onset apraxia, aphasia, dysarthria, agnosia, amnesia, paralysis, loss of coordination, or loss of consciousness  Allergies  Ashley Rodriguez is allergic to codeine and sulfa antibiotics.  Ashley Rodriguez  Drug: Ashley Rodriguez  reports that she does not use drugs. Alcohol:  reports that she drinks about 2.4 oz of alcohol per week. Tobacco:  reports that  has never smoked. she has never used smokeless tobacco. Medical:  has a past medical history of Arthritis, Flank lipoma (08/17/2016), Hypertension, and Neck mass (04/07/2013). Surgical: Ashley Rodriguez  has a past surgical history that includes Abdominal hysterectomy (2000); lipoma removed (04/2012); Colonoscopy (2015); and Neck mass excision (04/28/14). Family: family history is not on file.  Constitutional Exam  General appearance: Well nourished, well developed, and well hydrated. In no apparent acute distress Vitals:   05/06/17 0958  BP: (!) 154/81  Pulse: (!) 56  Resp: 16  Temp: 98 F (36.7 C)  SpO2: 100%  Weight: 146 lb (66.2 kg)  Height: '5\' 4"'$  (1.626 m)   BMI Assessment: Estimated body mass index is 25.06 kg/m as calculated from the following:   Height as of this encounter: '5\' 4"'$  (1.626 m).   Weight as of this encounter: 146 lb (66.2 kg).  BMI interpretation table: BMI level Category Range association with higher incidence of chronic pain  <18 kg/m2 Underweight   18.5-24.9 kg/m2 Ideal body weight   25-29.9 kg/m2 Overweight Increased incidence by 20%  30-34.9 kg/m2 Obese (Class I) Increased  incidence by 68%  35-39.9 kg/m2 Severe obesity (Class II) Increased incidence by 136%  >40 kg/m2 Extreme obesity (Class III) Increased incidence by 254%   BMI Readings from Last 4 Encounters:  05/06/17 25.06 kg/m  04/18/17 24.72 kg/m  02/11/17 24.72 kg/m  01/22/17 24.72 kg/m   Wt Readings from Last 4 Encounters:  05/06/17 146 lb (  66.2 kg)  04/18/17 144 lb (65.3 kg)  02/11/17 144 lb (65.3 kg)  01/22/17 144 lb (65.3 kg)  Psych/Mental status: Alert, oriented x 3 (person, place, & time)       Eyes: PERLA Respiratory: No evidence of acute respiratory distress  Cervical Spine Area Exam  Skin & Axial Inspection: No masses, redness, edema, swelling, or associated skin lesions Alignment: Symmetrical Functional ROM: Unrestricted ROM      Stability: No instability detected Muscle Tone/Strength: Functionally intact. No obvious neuro-muscular anomalies detected. Sensory (Neurological): Unimpaired Palpation: No palpable anomalies              Upper Extremity (UE) Exam    Side: Right upper extremity  Side: Left upper extremity  Skin & Extremity Inspection: Skin color, temperature, and hair growth are WNL. No peripheral edema or cyanosis. No masses, redness, swelling, asymmetry, or associated skin lesions. No contractures.  Skin & Extremity Inspection: Skin color, temperature, and hair growth are WNL. No peripheral edema or cyanosis. No masses, redness, swelling, asymmetry, or associated skin lesions. No contractures.  Functional ROM: Unrestricted ROM          Functional ROM: Unrestricted ROM          Muscle Tone/Strength: Functionally intact. No obvious neuro-muscular anomalies detected.  Muscle Tone/Strength: Functionally intact. No obvious neuro-muscular anomalies detected.  Sensory (Neurological): Unimpaired          Sensory (Neurological): Unimpaired          Palpation: No palpable anomalies              Palpation: No palpable anomalies              Specialized Test(s): Deferred          Specialized Test(s): Deferred          Thoracic Spine Area Exam  Skin & Axial Inspection: No masses, redness, or swelling Alignment: Symmetrical Functional ROM: Unrestricted ROM Stability: No instability detected Muscle Tone/Strength: Functionally intact. No obvious neuro-muscular anomalies detected. Sensory (Neurological): Unimpaired Muscle strength & Tone: No palpable anomalies  Lumbar Spine Area Exam  Skin & Axial Inspection: No masses, redness, or swelling Alignment: Symmetrical Functional ROM: Improved after treatment      Stability: No instability detected Muscle Tone/Strength: Functionally intact. No obvious neuro-muscular anomalies detected. Sensory (Neurological): Unimpaired Palpation: No palpable anomalies       Provocative Tests: Lumbar Hyperextension and rotation test: Improved after treatment       Lumbar Lateral bending test: evaluation deferred today       Patrick's Maneuver: evaluation deferred today                    Gait & Posture Assessment  Ambulation: Unassisted Gait: Relatively normal for age and body habitus Posture: WNL   Lower Extremity Exam    Side: Right lower extremity  Side: Left lower extremity  Skin & Extremity Inspection: Skin color, temperature, and hair growth are WNL. No peripheral edema or cyanosis. No masses, redness, swelling, asymmetry, or associated skin lesions. No contractures.  Skin & Extremity Inspection: Skin color, temperature, and hair growth are WNL. No peripheral edema or cyanosis. No masses, redness, swelling, asymmetry, or associated skin lesions. No contractures.  Functional ROM: Unrestricted ROM          Functional ROM: Unrestricted ROM          Muscle Tone/Strength: Functionally intact. No obvious neuro-muscular anomalies detected.  Muscle Tone/Strength: Functionally intact. No obvious neuro-muscular anomalies detected.  Sensory (Neurological): Unimpaired  Sensory (Neurological): Unimpaired  Palpation: No palpable anomalies   Palpation: No palpable anomalies   Assessment  Primary Diagnosis & Pertinent Problem List: The primary encounter diagnosis was Chronic low back pain (Primary Area of Pain) (Bilateral) (R>L). Diagnoses of Lumbar facet syndrome (Bilateral) (R>L), Chronic lower extremity pain (Secondary Area of Pain) (Bilateral) (R>L), and Chronic sacroiliac joint pain (Bilateral) (R>L) were also pertinent to this visit.  Status Diagnosis  Resolved Controlled Controlled 1. Chronic low back pain (Primary Area of Pain) (Bilateral) (R>L)   2. Lumbar facet syndrome (Bilateral) (R>L)   3. Chronic lower extremity pain (Secondary Area of Pain) (Bilateral) (R>L)   4. Chronic sacroiliac joint pain (Bilateral) (R>L)     Problems updated and reviewed during this visit: No problems updated. Plan of Care  Pharmacotherapy (Medications Ordered): No orders of the defined types were placed in this encounter.  Medications administered today: Ashley Rodriguez had no medications administered during this visit.   Procedure Orders     LUMBAR FACET(MEDIAL BRANCH NERVE BLOCK) MBNB     Radiofrequency,Lumbar Lab Orders  No laboratory test(s) ordered today   Imaging Orders  No imaging studies ordered today   Referral Orders  No referral(s) requested today    Interventional management options: Planned, scheduled, and/or pending:   None at this point. She will call.  If the pain returns, we will proceed with bilateral lumbar facet RFA.  If by any chance he takes a while to get it approved, we can do a repeat palliative bilateral lumbar facet block under fluoroscopic guidance and IV sedation.  If we proceed with radiofrequency, will be doing both sides, but will start with the left one first.   Considering:   Diagnostic bilateral lumbar facet block#3 Possible bilateral lumbar facet RFA Diagnostic bilateral sacroiliac joint block Possible bilateral sacroiliac joint RFA Diagnostic bilateral hip joint injection Possible  bilateral femoral nerve +obturator nerve block Possible bilateral femoral nerve + obturator nerve RFA Diagnostic right-sided L4-5 lumbar epidural steroid injection   Palliative PRN treatment(s):   Palliative bilateral lumbar facet block #3 under fluoroscopic guidance and IV sedation   Provider-requested follow-up: Return for PRN Procedure.  Future Appointments  Date Time Provider [place]  05/15/2017  8:15 AM Garrel Ridgel, DPM [id number]   Primary Care Physician: Gaynelle Arabian, MD Location: [facility] Note by: Gaspar Cola, MD Date: 05/06/2017; Time: 12:49 PM

## 2017-05-06 ENCOUNTER — Other Ambulatory Visit: Payer: Self-pay

## 2017-05-06 ENCOUNTER — Encounter: Payer: Self-pay | Admitting: Pain Medicine

## 2017-05-06 ENCOUNTER — Ambulatory Visit: Payer: BLUE CROSS/BLUE SHIELD | Attending: Pain Medicine | Admitting: Pain Medicine

## 2017-05-06 VITALS — BP 154/81 | HR 56 | Temp 98.0°F | Resp 16 | Ht 64.0 in | Wt 146.0 lb

## 2017-05-06 DIAGNOSIS — G894 Chronic pain syndrome: Secondary | ICD-10-CM | POA: Diagnosis not present

## 2017-05-06 DIAGNOSIS — G8929 Other chronic pain: Secondary | ICD-10-CM

## 2017-05-06 DIAGNOSIS — Z885 Allergy status to narcotic agent status: Secondary | ICD-10-CM | POA: Insufficient documentation

## 2017-05-06 DIAGNOSIS — M5136 Other intervertebral disc degeneration, lumbar region: Secondary | ICD-10-CM | POA: Insufficient documentation

## 2017-05-06 DIAGNOSIS — M5441 Lumbago with sciatica, right side: Secondary | ICD-10-CM

## 2017-05-06 DIAGNOSIS — R221 Localized swelling, mass and lump, neck: Secondary | ICD-10-CM | POA: Insufficient documentation

## 2017-05-06 DIAGNOSIS — M7918 Myalgia, other site: Secondary | ICD-10-CM | POA: Insufficient documentation

## 2017-05-06 DIAGNOSIS — Z79899 Other long term (current) drug therapy: Secondary | ICD-10-CM | POA: Diagnosis not present

## 2017-05-06 DIAGNOSIS — M533 Sacrococcygeal disorders, not elsewhere classified: Secondary | ICD-10-CM | POA: Diagnosis not present

## 2017-05-06 DIAGNOSIS — L4050 Arthropathic psoriasis, unspecified: Secondary | ICD-10-CM | POA: Insufficient documentation

## 2017-05-06 DIAGNOSIS — M79605 Pain in left leg: Secondary | ICD-10-CM | POA: Diagnosis not present

## 2017-05-06 DIAGNOSIS — R200 Anesthesia of skin: Secondary | ICD-10-CM | POA: Insufficient documentation

## 2017-05-06 DIAGNOSIS — M545 Low back pain: Secondary | ICD-10-CM | POA: Diagnosis not present

## 2017-05-06 DIAGNOSIS — Z791 Long term (current) use of non-steroidal anti-inflammatories (NSAID): Secondary | ICD-10-CM | POA: Diagnosis not present

## 2017-05-06 DIAGNOSIS — I10 Essential (primary) hypertension: Secondary | ICD-10-CM | POA: Insufficient documentation

## 2017-05-06 DIAGNOSIS — Z90710 Acquired absence of both cervix and uterus: Secondary | ICD-10-CM | POA: Insufficient documentation

## 2017-05-06 DIAGNOSIS — M47816 Spondylosis without myelopathy or radiculopathy, lumbar region: Secondary | ICD-10-CM | POA: Diagnosis not present

## 2017-05-06 DIAGNOSIS — M79604 Pain in right leg: Secondary | ICD-10-CM

## 2017-05-06 DIAGNOSIS — M4316 Spondylolisthesis, lumbar region: Secondary | ICD-10-CM | POA: Diagnosis not present

## 2017-05-06 DIAGNOSIS — M5442 Lumbago with sciatica, left side: Secondary | ICD-10-CM

## 2017-05-06 NOTE — Patient Instructions (Addendum)
____________________________________________________________________________________________  Preparing for Procedure with Sedation Instructions: . Oral Intake: Do not eat or drink anything for at least 8 hours prior to your procedure. . Transportation: Public transportation is not allowed. Bring an adult driver. The driver must be physically present in our waiting room before any procedure can be started. Marland Kitchen Physical Assistance: Bring an adult physically capable of assisting you, in the event you need help. This adult should keep you company at home for at least 6 hours after the procedure. . Blood Pressure Medicine: Take your blood pressure medicine with a sip of water the morning of the procedure. . Blood thinners:  . Diabetics on insulin: Notify the staff so that you can be scheduled 1st case in the morning. If your diabetes requires high dose insulin, take only  of your normal insulin dose the morning of the procedure and notify the staff that you have done so. . Preventing infections: Shower with an antibacterial soap the morning of your procedure. . Build-up your immune system: Take 1000 mg of Vitamin C with every meal (3 times a day) the day prior to your procedure. Marland Kitchen Antibiotics: Inform the staff if you have a condition or reason that requires you to take antibiotics before dental procedures. . Pregnancy: If you are pregnant, call and cancel the procedure. . Sickness: If you have a cold, fever, or any active infections, call and cancel the procedure. . Arrival: You must be in the facility at least 30 minutes prior to your scheduled procedure. . Children: Do not bring children with you. . Dress appropriately: Bring dark clothing that you would not mind if they get stained. . Valuables: Do not bring any jewelry or valuables. Procedure appointments are reserved for interventional treatments only. Marland Kitchen No Prescription Refills. . No medication changes will be discussed during procedure  appointments. . No disability issues will be discussed. ____________________________________________________________________________________________  ____________________________________________________________________________________________  Pain Prevention Technique  Definition:   A technique used to minimize the effects of an activity known to cause inflammation or swelling, which in turn leads to an increase in pain.  Purpose: To prevent swelling from occurring. It is based on the fact that it is easier to prevent swelling from happening than it is to get rid of it, once it occurs.  Contraindications: 1. Anyone with allergy or hypersensitivity to the recommended medications. 2. Anyone taking anticoagulants (Blood Thinners) (e.g., Coumadin, Warfarin, Plavix, etc.). 3. Patients in Renal Failure.  Technique: Before you undertake an activity known to cause pain, or a flare-up of your chronic pain, and before you experience any pain, do the following:  1. On a full stomach, take 4 (four) over the counter Ibuprofens 200mg  tablets (Motrin), for a total of 800 mg. 2. In addition, take over the counter Magnesium 400 to 500 mg, before doing the activity.  3. Six (6) hours later, again on a full stomach, repeat the Ibuprofen. 4. That night, take a warm shower and stretch under the running warm water.  This technique may be sufficient to abort the pain and discomfort before it happens. Keep in mind that it takes a lot less medication to prevent swelling than it takes to eliminate it once it occurs.  ____________________________________________________________________________________________   Radiofrequency Lesioning Radiofrequency lesioning is a procedure that is performed to relieve pain. The procedure is often used for back, neck, or arm pain. Radiofrequency lesioning involves the use of a machine that creates radio waves to make heat. During the procedure, the heat is applied to the  nerve that carries the pain signal. The heat damages the nerve and interferes with the pain signal. Pain relief usually starts about 2 weeks after the procedure and lasts for 6 months to 1 year. Tell a health care provider about:  Any allergies you have.  All medicines you are taking, including vitamins, herbs, eye drops, creams, and over-the-counter medicines.  Any problems you or family members have had with anesthetic medicines.  Any blood disorders you have.  Any surgeries you have had.  Any medical conditions you have.  Whether you are pregnant or may be pregnant. What are the risks? Generally, this is a safe procedure. However, problems may occur, including:  Pain or soreness at the injection site.  Infection at the injection site.  Damage to nerves or blood vessels.  What happens before the procedure?  Ask your health care provider about: ? Changing or stopping your regular medicines. This is especially important if you are taking diabetes medicines or blood thinners. ? Taking medicines such as aspirin and ibuprofen. These medicines can thin your blood. Do not take these medicines before your procedure if your health care provider instructs you not to.  Follow instructions from your health care provider about eating or drinking restrictions.  Plan to have someone take you home after the procedure.  If you go home right after the procedure, plan to have someone with you for 24 hours. What happens during the procedure?  You will be given one or more of the following: ? A medicine to help you relax (sedative). ? A medicine to numb the area (local anesthetic).  You will be awake during the procedure. You will need to be able to talk with the health care provider during the procedure.  With the help of a type of X-ray (fluoroscopy), the health care provider will insert a radiofrequency needle into the area to be treated.  Next, a wire that carries the radio waves  (electrode) will be put through the radiofrequency needle. An electrical pulse will be sent through the electrode to verify the correct nerve. You will feel a tingling sensation, and you may have muscle twitching.  Then, the tissue that is around the needle tip will be heated by an electric current that is passed using the radiofrequency machine. This will numb the nerves.  A bandage (dressing) will be put on the insertion area after the procedure is done. The procedure may vary among health care providers and hospitals. What happens after the procedure?  Your blood pressure, heart rate, breathing rate, and blood oxygen level will be monitored often until the medicines you were given have worn off.  Return to your normal activities as directed by your health care provider. This information is not intended to replace advice given to you by your health care provider. Make sure you discuss any questions you have with your health care provider. Document Released: 12/13/2010 Document Revised: 09/22/2015 Document Reviewed: 05/24/2014 Elsevier Interactive Patient Education  2018 [employer].  Facet Joint Block The facet joints connect the bones of the spine (vertebrae). They make it possible for you to bend, twist, and make other movements with your spine. They also keep you from bending too far, twisting too far, and making other excessive movements. A facet joint block is a procedure where a numbing medicine (anesthetic) is injected into a facet joint. Often, a type of anti-inflammatory medicine called a steroid is also injected. A facet joint block may be done to diagnose neck or back  pain. If the pain gets better after a facet joint block, it means the pain is probably coming from the facet joint. If the pain does not get better, it means the pain is probably not coming from the facet joint. A facet joint block may also be done to relieve neck or back pain caused by an inflamed facet joint. A facet  joint block is only done to relieve pain if the pain does not improve with other methods, such as medicine, exercise programs, and physical therapy. Tell a health care provider about:  Any allergies you have.  All medicines you are taking, including vitamins, herbs, eye drops, creams, and over-the-counter medicines.  Any problems you or family members have had with anesthetic medicines.  Any blood disorders you have.  Any surgeries you have had.  Any medical conditions you have.  Whether you are pregnant or may be pregnant. What are the risks? Generally, this is a safe procedure. However, problems may occur, including:  Bleeding.  Injury to a nerve near the injection site.  Pain at the injection site.  Weakness or numbness in areas controlled by nerves near the injection site.  Infection.  Temporary fluid retention.  Allergic reactions to medicines or dyes.  Injury to other structures or organs near the injection site.  What happens before the procedure?  Follow instructions from your health care provider about eating or drinking restrictions.  Ask your health care provider about: ? Changing or stopping your regular medicines. This is especially important if you are taking diabetes medicines or blood thinners. ? Taking medicines such as aspirin and ibuprofen. These medicines can thin your blood. Do not take these medicines before your procedure if your health care provider instructs you not to.  Do not take any new dietary supplements or medicines without asking your health care provider first.  Plan to have someone take you home after the procedure. What happens during the procedure?  You may need to remove your clothing and dress in an open-back gown.  The procedure will be done while you are lying on an X-ray table. You will most likely be asked to lie on your stomach, but you may be asked to lie in a different position if an injection will be made in your  neck.  Machines will be used to monitor your oxygen levels, heart rate, and blood pressure.  If an injection will be made in your neck, an IV tube will be inserted into one of your veins. Fluids and medicine will flow directly into your body through the IV tube.  The area over the facet joint where the injection will be made will be cleaned with soap. The surrounding skin will be covered with clean drapes.  A numbing medicine (local anesthetic) will be applied to your skin. Your skin may sting or burn for a moment.  A video X-ray machine (fluoroscopy) will be used to locate the joint. In some cases, a CT scan may be used.  A contrast dye may be injected into the facet joint area to help locate the joint.  When the joint is located, an anesthetic will be injected into the joint through the needle.  Your health care provider will ask you whether you feel pain relief. If you do feel relief, a steroid may be injected to provide pain relief for a longer period of time. If you do not feel relief or feel only partial relief, additional injections of an anesthetic may be made in other  facet joints.  The needle will be removed.  Your skin will be cleaned.  A bandage (dressing) will be applied over each injection site. The procedure may vary among health care providers and hospitals. What happens after the procedure?  You will be observed for 15-30 minutes before being allowed to go home. This information is not intended to replace advice given to you by your health care provider. Make sure you discuss any questions you have with your health care provider. Document Released: 09/05/2006 Document Revised: 05/18/2015 Document Reviewed: 01/10/2015 Elsevier Interactive Patient Education  [employer].

## 2017-05-15 ENCOUNTER — Encounter: Payer: Self-pay | Admitting: Podiatry

## 2017-05-15 ENCOUNTER — Ambulatory Visit (INDEPENDENT_AMBULATORY_CARE_PROVIDER_SITE_OTHER): Payer: BLUE CROSS/BLUE SHIELD | Admitting: Podiatry

## 2017-05-15 ENCOUNTER — Ambulatory Visit: Payer: BLUE CROSS/BLUE SHIELD

## 2017-05-15 DIAGNOSIS — M722 Plantar fascial fibromatosis: Secondary | ICD-10-CM

## 2017-05-15 DIAGNOSIS — Q828 Other specified congenital malformations of skin: Secondary | ICD-10-CM

## 2017-05-15 NOTE — Progress Notes (Signed)
She presents today with a chief complaint of a painful porokeratosis to the right forefoot for the past 2-3 months states that is been couple of years since we worked on it.  She states that it is painful to walk the only thing that she has done in the past is to use a pumice stone.  She is also concerned about an ingrown toenail hallux left.  Objective: Vital signs are stable she is alert and oriented x3 solitary porokeratotic lesion beneath the third metatarsal head of the right foot is present.  This is small nucleated no erythema cellulitis drainage or odor.  Hallux nail left does demonstrate what appears to be onychomycosis but there is not enough nail to sample.  Assessment: Porokeratosis painful nature right foot.  Benign hyperkeratotic lesion.  Probable onychomycosis cannot rule out nail dystrophy hallux left.  Plan: Debrided the reactive hyperkeratosis today.  I recommended that she allow the toenail to grow and I will treatment and take samples of the next time she comes in.  I will see her in about 1 month.

## 2017-05-29 DIAGNOSIS — E7800 Pure hypercholesterolemia, unspecified: Secondary | ICD-10-CM | POA: Diagnosis not present

## 2017-05-29 DIAGNOSIS — Z79899 Other long term (current) drug therapy: Secondary | ICD-10-CM | POA: Diagnosis not present

## 2017-06-17 ENCOUNTER — Encounter: Payer: Self-pay | Admitting: Podiatry

## 2017-06-17 ENCOUNTER — Ambulatory Visit (INDEPENDENT_AMBULATORY_CARE_PROVIDER_SITE_OTHER): Payer: BLUE CROSS/BLUE SHIELD | Admitting: Podiatry

## 2017-06-17 DIAGNOSIS — L603 Nail dystrophy: Secondary | ICD-10-CM

## 2017-06-17 DIAGNOSIS — Q828 Other specified congenital malformations of skin: Secondary | ICD-10-CM

## 2017-06-17 DIAGNOSIS — L608 Other nail disorders: Secondary | ICD-10-CM | POA: Diagnosis not present

## 2017-06-17 NOTE — Progress Notes (Signed)
She presents today with chief complaint of painful callus plantar aspect of the right foot states that he feels much better than it did prior to last visit.  She states that she has allowed her toenails to grow out so that we can take a better sample of those today.  Objective: Vital signs are stable alert oriented x3 pulses are palpable.  No open lesions or wounds mild reactive hyperkeratosis sub-fourth metatarsal head of the right foot with no signs of infection.  Hallux nails are slightly thickened distally with discoloration and the sharp incurvated margins.  There is no malodor some subungual debris is present.  Assessment: Nail dystrophy cannot rule out onychomycosis samples will be taken.  Reactive hyperkeratotic lesion benign nature sub-fourth metatarsal right foot.  Plan: Debrided reactive hyperkeratotic lesion for her once again today and took samples of the toenail to be sent for pathologic evaluation we will follow-up with her in 1 month.

## 2017-07-15 ENCOUNTER — Ambulatory Visit (INDEPENDENT_AMBULATORY_CARE_PROVIDER_SITE_OTHER): Payer: BLUE CROSS/BLUE SHIELD | Admitting: Podiatry

## 2017-07-15 ENCOUNTER — Encounter: Payer: Self-pay | Admitting: Podiatry

## 2017-07-15 DIAGNOSIS — L603 Nail dystrophy: Secondary | ICD-10-CM | POA: Diagnosis not present

## 2017-07-15 NOTE — Progress Notes (Signed)
She presents today for follow-up of her nail biopsy.  As well as her porokeratosis.  Objective: Vital signs are stable she is alert and oriented x3.  Pulses are palpable.  Neurologic sensorium is intact.  Deep tendon reflexes are intact.  Muscle strength normal symmetrical bilateral.  Pathology report discussed with her in great detail demonstrating only nail dystrophy.  No signs of fungal infection.  Porokeratotic lesion plantar aspect fourth metatarsal of the right foot is still present.  Much decreased from previous symptoms.  Assessment: Nail dystrophy no onychomycosis porokeratosis plantar aspect right foot.  Plan: After sterile Betadine skin prep debrided the reactive hyper keratoma today.  We will follow-up with me on an as-needed basis.  Discussed alternative therapies with her.

## 2017-08-19 DIAGNOSIS — M47817 Spondylosis without myelopathy or radiculopathy, lumbosacral region: Secondary | ICD-10-CM | POA: Insufficient documentation

## 2017-08-19 NOTE — Progress Notes (Signed)
Patient's Name: Ashley Rodriguez  MRN: [id number]  Referring Provider: Milinda Pointer, MD  DOB: 11/09/1950  PCP: Gaynelle Arabian, MD  DOS: 08/20/2017  Note by: Gaspar Cola, MD  Service setting: Ambulatory outpatient  Specialty: Interventional Pain Management  Patient type: Established  Location: [facility] (AMB) Pain Management Facility  Visit type: Interventional Procedure   Primary Reason for Visit: Interventional Pain Management Treatment. CC: Back Pain (lower)  Procedure:       Anesthesia, Analgesia, Anxiolysis:  Type: Lumbar Facet, Medial Branch Block(s) #3  Primary Purpose: Diagnostic Region: Posterolateral Lumbosacral Spine Level: L2, L3, L4, L5, & S1 Medial Branch Level(s). Injecting these levels blocks the L3-4, L4-5, and L5-S1 lumbar facet joints. Laterality: Bilateral  Type: Moderate (Conscious) Sedation combined with Local Anesthesia Indication(s): Analgesia and Anxiety Route: Intravenous (IV) IV Access: Secured Sedation: Meaningful verbal contact was maintained at all times during the procedure  Local Anesthetic: Lidocaine 1-2%   Indications: 1. Spondylosis without myelopathy or radiculopathy, lumbosacral region   2. Lumbar facet syndrome (Bilateral) (R>L)   3. L5 pars defect with spondylolisthesis (Bilateral)   4. Grade 1 Anterolisthesis of L5 over S1   5. Chronic low back pain (Primary Area of Pain) (Bilateral) (R>L)    Pain Score: Pre-procedure: 5 /10 Post-procedure: 0-No pain/10  Pre-op Assessment:  Ashley Rodriguez is a 67 y.o. (year old), female patient, seen today for interventional treatment. She  has a past surgical history that includes Abdominal hysterectomy (2000); lipoma removed (04/2012); Colonoscopy (2015); and Neck mass excision (04/28/14). Ashley Rodriguez has a current medication list which includes the following prescription(s): acetaminophen, amlodipine, atenolol, atorvastatin, calcium carbonate, enbrel sureclick, etanercept, fexofenadine, fluticasone, meloxicam,  montelukast, multivitamin with minerals, omeprazole, prednisone, and valacyclovir, and the following Facility-Administered Medications: fentanyl and midazolam. Her primarily concern today is the Back Pain (lower)  Initial Vital Signs:  Pulse/HCG Rate: 76ECG Heart Rate: 63 Temp: (!) 97.4 F (36.3 C) Resp: 16 BP: (!) 151/85 SpO2: 97 %  BMI: Estimated body mass index is 25.92 kg/m as calculated from the following:   Height as of this encounter: 5\' 4"  (1.626 m).   Weight as of this encounter: 151 lb (68.5 kg).  Risk Assessment: Allergies: Reviewed. She is allergic to codeine and sulfa antibiotics.  Allergy Precautions: None required Coagulopathies: Reviewed. None identified.  Blood-thinner therapy: None at this time Active Infection(s): Reviewed. None identified. Ashley Rodriguez is afebrile  Site Confirmation: Ashley Rodriguez was asked to confirm the procedure and laterality before marking the site Procedure checklist: Completed Consent: Before the procedure and under the influence of no sedative(s), amnesic(s), or anxiolytics, the patient was informed of the treatment options, risks and possible complications. To fulfill our ethical and legal obligations, as recommended by the American Medical Association's Code of Ethics, I have informed the patient of my clinical impression; the nature and purpose of the treatment or procedure; the risks, benefits, and possible complications of the intervention; the alternatives, including doing nothing; the risk(s) and benefit(s) of the alternative treatment(s) or procedure(s); and the risk(s) and benefit(s) of doing nothing. The patient was provided information about the general risks and possible complications associated with the procedure. These may include, but are not limited to: failure to achieve desired goals, infection, bleeding, organ or nerve damage, allergic reactions, paralysis, and death. In addition, the patient was informed of those risks and  complications associated to Spine-related procedures, such as failure to decrease pain; infection (i.e.: Meningitis, epidural or intraspinal abscess); bleeding (i.e.: epidural hematoma, subarachnoid hemorrhage, or any  other type of intraspinal or peri-dural bleeding); organ or nerve damage (i.e.: Any type of peripheral nerve, nerve root, or spinal cord injury) with subsequent damage to sensory, motor, and/or autonomic systems, resulting in permanent pain, numbness, and/or weakness of one or several areas of the body; allergic reactions; (i.e.: anaphylactic reaction); and/or death. Furthermore, the patient was informed of those risks and complications associated with the medications. These include, but are not limited to: allergic reactions (i.e.: anaphylactic or anaphylactoid reaction(s)); adrenal axis suppression; blood sugar elevation that in diabetics may result in ketoacidosis or comma; water retention that in patients with history of congestive heart failure may result in shortness of breath, pulmonary edema, and decompensation with resultant heart failure; weight gain; swelling or edema; medication-induced neural toxicity; particulate matter embolism and blood vessel occlusion with resultant organ, and/or nervous system infarction; and/or aseptic necrosis of one or more joints. Finally, the patient was informed that Medicine is not an exact science; therefore, there is also the possibility of unforeseen or unpredictable risks and/or possible complications that may result in a catastrophic outcome. The patient indicated having understood very clearly. We have given the patient no guarantees and we have made no promises. Enough time was given to the patient to ask questions, all of which were answered to the patient's satisfaction. Ashley Rodriguez has indicated that she wanted to continue with the procedure. Attestation: I, the ordering provider, attest that I have discussed with the patient the benefits, risks,  side-effects, alternatives, likelihood of achieving goals, and potential problems during recovery for the procedure that I have provided informed consent. Date  Time: 08/20/2017  7:50 AM  Pre-Procedure Preparation:  Monitoring: As per clinic protocol. Respiration, ETCO2, SpO2, BP, heart rate and rhythm monitor placed and checked for adequate function Safety Precautions: Patient was assessed for positional comfort and pressure points before starting the procedure. Time-out: I initiated and conducted the "Time-out" before starting the procedure, as per protocol. The patient was asked to participate by confirming the accuracy of the "Time Out" information. Verification of the correct person, site, and procedure were performed and confirmed by me, the nursing staff, and the patient. "Time-out" conducted as per Joint Commission's Universal Protocol (UP.01.01.01). Time: 0831  Description of Procedure:       Position: Prone Laterality: Bilateral. The procedure was performed in identical fashion on both sides. Levels:  L2, L3, L4, L5, & S1 Medial Branch Level(s) Area Prepped: Posterior Lumbosacral Region Prepping solution: ChloraPrep (2% chlorhexidine gluconate and 70% isopropyl alcohol) Safety Precautions: Aspiration looking for blood return was conducted prior to all injections. At no point did we inject any substances, as a needle was being advanced. Before injecting, the patient was told to immediately notify me if she was experiencing any new onset of "ringing in the ears, or metallic taste in the mouth". No attempts were made at seeking any paresthesias. Safe injection practices and needle disposal techniques used. Medications properly checked for expiration dates. SDV (single dose vial) medications used. After the completion of the procedure, all disposable equipment used was discarded in the proper designated medical waste containers. Local Anesthesia: Protocol guidelines were followed. The patient  was positioned over the fluoroscopy table. The area was prepped in the usual manner. The time-out was completed. The target area was identified using fluoroscopy. A 12-in long, straight, sterile hemostat was used with fluoroscopic guidance to locate the targets for each level blocked. Once located, the skin was marked with an approved surgical skin marker. Once all sites were  marked, the skin (epidermis, dermis, and hypodermis), as well as deeper tissues (fat, connective tissue and muscle) were infiltrated with a small amount of a short-acting local anesthetic, loaded on a 10cc syringe with a 25G, 1.5-in  Needle. An appropriate amount of time was allowed for local anesthetics to take effect before proceeding to the next step. Local Anesthetic: Lidocaine 2.0% The unused portion of the local anesthetic was discarded in the proper designated containers. Technical explanation of process:  L2 Medial Branch Nerve Block (MBB): The target area for the L2 medial branch is at the junction of the postero-lateral aspect of the superior articular process and the superior, posterior, and medial edge of the transverse process of L3. Under fluoroscopic guidance, a Quincke needle was inserted until contact was made with os over the superior postero-lateral aspect of the pedicular shadow (target area). After negative aspiration for blood, 0.5 mL of the nerve block solution was injected without difficulty or complication. The needle was removed intact. L3 Medial Branch Nerve Block (MBB): The target area for the L3 medial branch is at the junction of the postero-lateral aspect of the superior articular process and the superior, posterior, and medial edge of the transverse process of L4. Under fluoroscopic guidance, a Quincke needle was inserted until contact was made with os over the superior postero-lateral aspect of the pedicular shadow (target area). After negative aspiration for blood, 0.5 mL of the nerve block solution was  injected without difficulty or complication. The needle was removed intact. L4 Medial Branch Nerve Block (MBB): The target area for the L4 medial branch is at the junction of the postero-lateral aspect of the superior articular process and the superior, posterior, and medial edge of the transverse process of L5. Under fluoroscopic guidance, a Quincke needle was inserted until contact was made with os over the superior postero-lateral aspect of the pedicular shadow (target area). After negative aspiration for blood, 0.5 mL of the nerve block solution was injected without difficulty or complication. The needle was removed intact. L5 Medial Branch Nerve Block (MBB): The target area for the L5 medial branch is at the junction of the postero-lateral aspect of the superior articular process and the superior, posterior, and medial edge of the sacral ala. Under fluoroscopic guidance, a Quincke needle was inserted until contact was made with os over the superior postero-lateral aspect of the pedicular shadow (target area). After negative aspiration for blood, 0.5 mL of the nerve block solution was injected without difficulty or complication. The needle was removed intact. S1 Medial Branch Nerve Block (MBB): The target area for the S1 medial branch is at the posterior and inferior 6 o'clock position of the L5-S1 facet joint. Under fluoroscopic guidance, the Quincke needle inserted for the L5 MBB was redirected until contact was made with os over the inferior and postero aspect of the sacrum, at the 6 o' clock position under the L5-S1 facet joint (Target area). After negative aspiration for blood, 0.5 mL of the nerve block solution was injected without difficulty or complication. The needle was removed intact. Procedural Needles: 22-gauge, 3.5-inch, Quincke needles used for all levels. Nerve block solution: 0.2% PF-Ropivacaine + Triamcinolone (40 mg/mL) diluted to a final concentration of 4 mg of Triamcinolone/mL of  Ropivacaine The unused portion of the solution was discarded in the proper designated containers.  Once the entire procedure was completed, the treated area was cleaned, making sure to leave some of the prepping solution back to take advantage of its long term bactericidal properties.  Illustration of the posterior view of the lumbar spine and the posterior neural structures. Laminae of L2 through S1 are labeled. DPRL5, dorsal primary ramus of L5; DPRS1, dorsal primary ramus of S1; DPR3, dorsal primary ramus of L3; FJ, facet (zygapophyseal) joint L3-L4; I, inferior articular process of L4; LB1, lateral branch of dorsal primary ramus of L1; IAB, inferior articular branches from L3 medial branch (supplies L4-L5 facet joint); IBP, intermediate branch plexus; MB3, medial branch of dorsal primary ramus of L3; NR3, third lumbar nerve root; S, superior articular process of L5; SAB, superior articular branches from L4 (supplies L4-5 facet joint also); TP3, transverse process of L3.  Vitals:   08/20/17 0840 08/20/17 0848 08/20/17 0857 08/20/17 0908  BP: 121/76 130/70 120/72 125/75  Pulse:      Resp: 11 12 15 18   Temp:  (!) 97.2 F (36.2 C)    TempSrc:  Temporal    SpO2: 93% 92% 96% 97%  Weight:      Height:        Start Time: 0831 hrs. End Time: 0840 hrs.  Imaging Guidance (Spinal):  Type of Imaging Technique: Fluoroscopy Guidance (Spinal) Indication(s): Assistance in needle guidance and placement for procedures requiring needle placement in or near specific anatomical locations not easily accessible without such assistance. Exposure Time: Please see nurses notes. Contrast: None used. Fluoroscopic Guidance: I was personally present during the use of fluoroscopy. "Tunnel Vision Technique" used to obtain the best possible view of the target area. Parallax error corrected before commencing the procedure. "Direction-depth-direction" technique used to introduce the needle under continuous pulsed  fluoroscopy. Once target was reached, antero-posterior, oblique, and lateral fluoroscopic projection used confirm needle placement in all planes. Images permanently stored in EMR. Interpretation: No contrast injected. I personally interpreted the imaging intraoperatively. Adequate needle placement confirmed in multiple planes. Permanent images saved into the patient's record.  Antibiotic Prophylaxis:   Anti-infectives (From admission, onward)   None     Indication(s): None identified  Post-operative Assessment:  Post-procedure Vital Signs:  Pulse/HCG Rate: 7663 Temp: (!) 97.2 F (36.2 C) Resp: 18 BP: 125/75 SpO2: 97 %  EBL: None  Complications: No immediate post-treatment complications observed by team, or reported by patient.  Note: The patient tolerated the entire procedure well. A repeat set of vitals were taken after the procedure and the patient was kept under observation following institutional policy, for this type of procedure. Post-procedural neurological assessment was performed, showing return to baseline, prior to discharge. The patient was provided with post-procedure discharge instructions, including a section on how to identify potential problems. Should any problems arise concerning this procedure, the patient was given instructions to immediately contact us, at any time, without hesitation. In any case, we plan to contact the patient by telephone for a follow-up status report regarding this interventional procedure.  Comments:  No additional relevant information.  Plan of Care    Imaging Orders     DG C-Arm 1-60 Min-No Report  Procedure Orders     LUMBAR FACET(MEDIAL BRANCH NERVE BLOCK) MBNB  Medications ordered for procedure: Meds ordered this encounter  Medications  . lidocaine (XYLOCAINE) 2 % (with pres) injection 400 mg  . midazolam (VERSED) 5 MG/5ML injection 1-2 mg    Make sure Flumazenil is available in the pyxis when using this medication. If  oversedation occurs, administer 0.2 mg IV over 15 sec. If after 45 sec no response, administer 0.2 mg again over 1 min; may repeat at 1 min intervals; not to  exceed 4 doses (1 mg)  . fentaNYL (SUBLIMAZE) injection 25-50 mcg    Make sure Narcan is available in the pyxis when using this medication. In the event of respiratory depression (RR< 8/min): Titrate NARCAN (naloxone) in increments of 0.1 to 0.2 mg IV at 2-3 minute intervals, until desired degree of reversal.  . lactated ringers infusion 1,000 mL  . ropivacaine (PF) 2 mg/mL (0.2%) (NAROPIN) injection 18 mL  . triamcinolone acetonide (KENALOG-40) injection 80 mg   Medications administered: We administered lidocaine, midazolam, fentaNYL, lactated ringers, ropivacaine (PF) 2 mg/mL (0.2%), and triamcinolone acetonide.  See the medical record for exact dosing, route, and time of administration.  New Prescriptions   No medications on file   Disposition: Discharge home  Discharge Date & Time: 08/20/2017; 0910 hrs.   Physician-requested Follow-up: Return for post-procedure eval (2 wks), w/ Dr. Dossie Arbour.  Future Appointments  Date Time Provider Amelia  09/11/2017  1:45 PM Milinda Pointer, MD [facility] None   Primary Care Physician: Gaynelle Arabian, MD Location: [facility] Note by: Gaspar Cola, MD Date: 08/20/2017; Time: 9:46 AM  Disclaimer:  Medicine is not an Chief Strategy Officer. The only guarantee in medicine is that nothing is guaranteed. It is important to note that the decision to proceed with this intervention was based on the information collected from the patient. The Data and conclusions were drawn from the patient's questionnaire, the interview, and the physical examination. Because the information was provided in large part by the patient, it cannot be guaranteed that it has not been purposely or unconsciously manipulated. Every effort has been made to obtain as much relevant data as  possible for this evaluation. It is important to note that the conclusions that lead to this procedure are derived in large part from the available data. Always take into account that the treatment will also be dependent on availability of resources and existing treatment guidelines, considered by other Pain Management Practitioners as being common knowledge and practice, at the time of the intervention. For Medico-Legal purposes, it is also important to point out that variation in procedural techniques and pharmacological choices are the acceptable norm. The indications, contraindications, technique, and results of the above procedure should only be interpreted and judged by a Board-Certified Interventional Pain Specialist with extensive familiarity and expertise in the same exact procedure and technique.

## 2017-08-20 ENCOUNTER — Encounter: Payer: Self-pay | Admitting: Pain Medicine

## 2017-08-20 ENCOUNTER — Ambulatory Visit (HOSPITAL_BASED_OUTPATIENT_CLINIC_OR_DEPARTMENT_OTHER): Payer: BLUE CROSS/BLUE SHIELD | Admitting: Pain Medicine

## 2017-08-20 ENCOUNTER — Ambulatory Visit
Admission: RE | Admit: 2017-08-20 | Discharge: 2017-08-20 | Disposition: A | Discharge status: Home / Self Care | Payer: BLUE CROSS/BLUE SHIELD | Source: Ambulatory Visit | Attending: Pain Medicine | Admitting: Pain Medicine

## 2017-08-20 ENCOUNTER — Other Ambulatory Visit: Payer: Self-pay

## 2017-08-20 VITALS — BP 125/75 | HR 76 | Temp 97.2°F | Resp 18 | Ht 64.0 in | Wt 151.0 lb

## 2017-08-20 DIAGNOSIS — M4316 Spondylolisthesis, lumbar region: Secondary | ICD-10-CM | POA: Diagnosis not present

## 2017-08-20 DIAGNOSIS — Z791 Long term (current) use of non-steroidal anti-inflammatories (NSAID): Secondary | ICD-10-CM | POA: Diagnosis not present

## 2017-08-20 DIAGNOSIS — F419 Anxiety disorder, unspecified: Secondary | ICD-10-CM | POA: Insufficient documentation

## 2017-08-20 DIAGNOSIS — Z882 Allergy status to sulfonamides status: Secondary | ICD-10-CM | POA: Diagnosis not present

## 2017-08-20 DIAGNOSIS — M47816 Spondylosis without myelopathy or radiculopathy, lumbar region: Secondary | ICD-10-CM | POA: Diagnosis not present

## 2017-08-20 DIAGNOSIS — M47817 Spondylosis without myelopathy or radiculopathy, lumbosacral region: Secondary | ICD-10-CM | POA: Diagnosis not present

## 2017-08-20 DIAGNOSIS — M4310 Spondylolisthesis, site unspecified: Secondary | ICD-10-CM

## 2017-08-20 DIAGNOSIS — Z79899 Other long term (current) drug therapy: Secondary | ICD-10-CM | POA: Insufficient documentation

## 2017-08-20 DIAGNOSIS — M4300 Spondylolysis, site unspecified: Secondary | ICD-10-CM

## 2017-08-20 DIAGNOSIS — G8929 Other chronic pain: Secondary | ICD-10-CM | POA: Diagnosis not present

## 2017-08-20 DIAGNOSIS — Z885 Allergy status to narcotic agent status: Secondary | ICD-10-CM | POA: Diagnosis not present

## 2017-08-20 DIAGNOSIS — M5441 Lumbago with sciatica, right side: Secondary | ICD-10-CM

## 2017-08-20 DIAGNOSIS — Z90710 Acquired absence of both cervix and uterus: Secondary | ICD-10-CM | POA: Insufficient documentation

## 2017-08-20 DIAGNOSIS — M5442 Lumbago with sciatica, left side: Secondary | ICD-10-CM

## 2017-08-20 DIAGNOSIS — M545 Low back pain: Secondary | ICD-10-CM | POA: Diagnosis not present

## 2017-08-20 MED ORDER — LIDOCAINE HCL 2 % IJ SOLN
20.0000 mL | Freq: Once | INTRAMUSCULAR | Status: AC
  Administered 2017-08-20: 400 mg
  Filled 2017-08-20: qty 40

## 2017-08-20 MED ORDER — FENTANYL CITRATE (PF) 100 MCG/2ML IJ SOLN
25.0000 ug | INTRAMUSCULAR | Status: DC | PRN
  Administered 2017-08-20: 50 ug via INTRAVENOUS
  Filled 2017-08-20: qty 2

## 2017-08-20 MED ORDER — ROPIVACAINE HCL 2 MG/ML IJ SOLN
18.0000 mL | Freq: Once | INTRAMUSCULAR | Status: AC
  Administered 2017-08-20: 18 mL via PERINEURAL
  Filled 2017-08-20: qty 20

## 2017-08-20 MED ORDER — LACTATED RINGERS IV SOLN
1000.0000 mL | Freq: Once | INTRAVENOUS | Status: AC
  Administered 2017-08-20: 1000 mL via INTRAVENOUS

## 2017-08-20 MED ORDER — MIDAZOLAM HCL 5 MG/5ML IJ SOLN
1.0000 mg | INTRAMUSCULAR | Status: DC | PRN
  Administered 2017-08-20: 2 mg via INTRAVENOUS
  Filled 2017-08-20: qty 5

## 2017-08-20 MED ORDER — TRIAMCINOLONE ACETONIDE 40 MG/ML IJ SUSP
80.0000 mg | Freq: Once | INTRAMUSCULAR | Status: AC
  Administered 2017-08-20: 80 mg
  Filled 2017-08-20: qty 2

## 2017-08-20 NOTE — Patient Instructions (Signed)

## 2017-08-21 ENCOUNTER — Telehealth: Payer: Self-pay

## 2017-08-21 NOTE — Telephone Encounter (Signed)
Patient states she is doing OK.  Has had a couple of sharp pains when turning head.  Encouraged to use heat today and to call us if she need any questions or concerns.

## 2017-09-03 DIAGNOSIS — E7800 Pure hypercholesterolemia, unspecified: Secondary | ICD-10-CM | POA: Diagnosis not present

## 2017-09-03 DIAGNOSIS — E119 Type 2 diabetes mellitus without complications: Secondary | ICD-10-CM | POA: Diagnosis not present

## 2017-09-03 DIAGNOSIS — I10 Essential (primary) hypertension: Secondary | ICD-10-CM | POA: Diagnosis not present

## 2017-09-03 DIAGNOSIS — K219 Gastro-esophageal reflux disease without esophagitis: Secondary | ICD-10-CM | POA: Diagnosis not present

## 2017-09-03 LAB — BASIC METABOLIC PANEL
BUN: 18 (ref 4–21)
CO2: 32 — AB (ref 13–22)
Chloride: 103 (ref 99–108)
Creatinine: 0.5 (ref 0.5–1.1)
Glucose: 111
Potassium: 4.3 (ref 3.4–5.3)
Sodium: 142 (ref 137–147)

## 2017-09-03 LAB — HEPATIC FUNCTION PANEL
ALT: 19 (ref 7–35)
AST: 13 (ref 13–35)
Alkaline Phosphatase: 36 (ref 25–125)
Bilirubin, Total: 0.6

## 2017-09-03 LAB — COMPREHENSIVE METABOLIC PANEL
Albumin: 4.1 (ref 3.5–5.0)
Calcium: 9.2 (ref 8.7–10.7)
GFR calc Af Amer: 137
GFR calc non Af Amer: 113

## 2017-09-10 NOTE — Progress Notes (Addendum)
Patient's Name: Ashley Rodriguez  MRN: [id number]  Referring Provider: Gaynelle Arabian, MD  DOB: 1950/12/27  PCP: Gaynelle Arabian, MD  DOS: 09/11/2017  Note by: Gaspar Cola, MD  Service setting: Ambulatory outpatient  Specialty: Interventional Pain Management  Location: [facility] (AMB) Pain Management Facility    Patient type: Established   Primary Reason(s) for Visit: Encounter for post-procedure evaluation of chronic illness with mild to moderate exacerbation CC: Back Pain (lower)  HPI  Ashley Rodriguez is a 67 y.o. year old, female patient, who comes today for a post-procedure evaluation. She has Psoriatic arthritis ([place]); Chronic low back pain (Primary Area of Pain) (Bilateral) (R>L); Chronic lower extremity pain (Secondary Area of Pain) (Bilateral) (R>L); Grade 1 Anterolisthesis of L5 over S1; L5 pars defect with spondylolisthesis (Bilateral); NSAID long-term use; Disorder of skeletal system; Pharmacologic therapy; Problems influencing health status; DDD (degenerative disc disease), lumbar; Lumbar facet syndrome (Bilateral) (R>L); Numbness and tingling (lower extremities) (Bilateral); Chronic pain syndrome; Chronic musculoskeletal pain; Neurogenic pain; Chronic sacroiliac joint pain (Bilateral) (R>L); Osteoarthritis of lumbar spine; and Spondylosis without myelopathy or radiculopathy, lumbosacral region on their problem list. Her primarily concern today is the Back Pain (lower)  Pain Assessment: Location: Lower Back Radiating: both legs to the knees Onset: More than a month ago Quality: Nagging Severity: 2 /10 (subjective, self-reported pain score)  Note: Reported level is compatible with observation.                         When using our objective Pain Scale, levels between 6 and 10/10 are said to belong in an emergency room, as it progressively worsens from a 6/10, described as severely limiting, requiring emergency care not usually available at an outpatient pain management facility. At a 6/10  level, communication becomes difficult and requires great effort. Assistance to reach the emergency department may be required. Facial flushing and profuse sweating along with potentially dangerous increases in heart rate and blood pressure will be evident. Timing: Intermittent Modifying factors: Tylenol BP: (!) 149/89  HR: 67  Ashley Rodriguez comes in today for post-procedure evaluation after the treatment done on 08/21/2017. Unfortunately, I was delayed in getting to her room due to the fact that I was treating a terminal cancer patient. The patient was unable to wait and she left the clinic without notifying anyone. She was seen by the nurse and all the information that we have on file was collected by our nursing staff. After carefully reviewing this information, it would seem that the patient did rather well with the diagnostic bilateral lumbar facet block #2. At this point we need to do his wait and see how long the benefit she gets from this block. If she gets longer than 3 months, we'll simply repeat the facet blocks, when necessary. Under the hand, if the benefit does not last 3 months, then we will consider radiofrequency ablation for the purpose of extending on that relief.  Further details on both, my assessment(s), as well as the proposed treatment plan, please see below.  Post-Procedure Assessment  08/20/2017 Procedure: Diagnostic bilateral lumbar facet block #2 under fluoroscopic guidance and IV sedation Pre-procedure pain score:  5/10 Post-procedure pain score: 0/10 (100% relief) Influential Factors: BMI: 26.09 kg/m Intra-procedural challenges: None observed.         Assessment challenges: None detected.              Reported side-effects: None.        Post-procedural adverse reactions  or complications: None reported         Sedation: Sedation provided. When no sedatives are used, the analgesic levels obtained are directly associated to the effectiveness of the local anesthetics.  However, when sedation is provided, the level of analgesia obtained during the initial 1 hour following the intervention, is believed to be the result of a combination of factors. These factors may include, but are not limited to: 1. The effectiveness of the local anesthetics used. 2. The effects of the analgesic(s) and/or anxiolytic(s) used. 3. The degree of discomfort experienced by the patient at the time of the procedure. 4. The patients ability and reliability in recalling and recording the events. 5. The presence and influence of possible secondary gains and/or psychosocial factors. Reported result: Relief experienced during the 1st hour after the procedure: 100 % (Ultra-Short Term Relief)            Interpretative annotation: Clinically appropriate result. Analgesia during this period is likely to be Local Anesthetic and/or IV Sedative (Analgesic/Anxiolytic) related.          Effects of local anesthetic: The analgesic effects attained during this period are directly associated to the localized infiltration of local anesthetics and therefore cary significant diagnostic value as to the etiological location, or anatomical origin, of the pain. Expected duration of relief is directly dependent on the pharmacodynamics of the local anesthetic used. Long-acting (4-6 hours) anesthetics used.  Reported result: Relief during the next 4 to 6 hour after the procedure: 100 % (Short-Term Relief)            Interpretative annotation: Clinically appropriate result. Analgesia during this period is likely to be Local Anesthetic-related.          Long-term benefit: Defined as the period of time past the expected duration of local anesthetics (1 hour for short-acting and 4-6 hours for long-acting). With the possible exception of prolonged sympathetic blockade from the local anesthetics, benefits during this period are typically attributed to, or associated with, other factors such as analgesic sensory neuropraxia,  antiinflammatory effects, or beneficial biochemical changes provided by agents other than the local anesthetics.  Reported result: Extended relief following procedure: 100 % (Long-Term Relief)            Interpretative annotation: Clinically possible results. Good relief. Therapeutic success. Inflammation plays a part in the etiology to the pain.          Current benefits: Defined as reported results that persistent at this point in time.   Analgesia: 100 %            Function: Ms. Colmenares reports improvement in function ROM: Ms. Krus reports improvement in ROM Interpretative annotation: Ongoing benefit. Therapeutic benefit observed. Effective therapeutic approach.          Interpretation: Results would suggest a successful diagnostic intervention.                  Plan:  Proceed with Radiofrequency Ablation for the purpose of attaining long-term benefits. Depending on how long-term benefit she gets from this injection, we can either repeat it or proceed with a radiofrequency. If she gets more than 3 months of pain relief from the injections, then he may be better just to continue with the nerve block without having to proceed with radiofrequency. However, if it does not last that long, then radiofrequency may be a better option.          Laboratory Chemistry  Inflammation Markers (CRP: Acute Phase) (ESR: Chronic  Phase) Lab Results  Component Value Date   CRP 0.8 01/03/2017   ESRSEDRATE 6 01/03/2017                         Renal Function Markers Lab Results  Component Value Date   BUN 9 01/03/2017   CREATININE 0.80 01/03/2017   GFRAA 90 01/03/2017   GFRNONAA 78 01/03/2017                              Hepatic Function Markers Lab Results  Component Value Date   AST 28 01/03/2017   ALT 24 01/03/2017   ALBUMIN 4.8 01/03/2017   ALKPHOS 55 01/03/2017                        Electrolytes Lab Results  Component Value Date   NA 140 01/03/2017   K 4.6 01/03/2017   CL 98  01/03/2017   CALCIUM 10.0 01/03/2017   MG 2.1 01/03/2017                        Neuropathy Markers Lab Results  Component Value Date   VITAMINB12 740 01/03/2017                        Bone Pathology Markers Lab Results  Component Value Date   25OHVITD1 50 01/03/2017   25OHVITD2 <1.0 01/03/2017   25OHVITD3 50 01/03/2017                         Note: Lab results reviewed.  Recent Diagnostic Imaging Results  DG C-Arm 1-60 Min-No Report Fluoroscopy was utilized by the requesting physician.  No radiographic  interpretation.   Complexity Note: I personally reviewed the fluoroscopic imaging of the procedure.                        Meds   Current Outpatient Medications:  .  acetaminophen (TYLENOL) 325 MG tablet, Take 325 mg by mouth every 6 (six) hours as needed., Disp: , Rfl:  .  amLODipine (NORVASC) 5 MG tablet, Take 5 mg by mouth daily., Disp: , Rfl:  .  atenolol (TENORMIN) 100 MG tablet, Take 100 mg by mouth daily., Disp: , Rfl:  .  atorvastatin (LIPITOR) 10 MG tablet, Take 1 tablet by mouth daily., Disp: , Rfl: 0 .  calcium carbonate (CALCIUM 600) 600 MG TABS tablet, Take 600 mg by mouth daily with breakfast., Disp: , Rfl:  .  ENBREL SURECLICK 50 MG/ML injection, , Disp: , Rfl: 4 .  etanercept (ENBREL) 50 MG/ML injection, Inject 50 mg into the skin once a week., Disp: , Rfl:  .  fexofenadine (ALLEGRA) 180 MG tablet, Take 180 mg by mouth daily., Disp: , Rfl:  .  fluticasone (FLONASE) 50 MCG/ACT nasal spray, , Disp: , Rfl: 4 .  meloxicam (MOBIC) 15 MG tablet, Take 15 mg by mouth daily. , Disp: , Rfl: 2 .  montelukast (SINGULAIR) 10 MG tablet, , Disp: , Rfl: 11 .  Multiple Vitamins-Minerals (MULTIVITAMIN WITH MINERALS) tablet, Take 1 tablet by mouth daily., Disp: , Rfl:  .  omeprazole (PRILOSEC) 40 MG capsule, , Disp: , Rfl: 2 .  predniSONE (DELTASONE) 5 MG tablet, Take 5 mg by mouth daily with breakfast., Disp: , Rfl:  .  valACYclovir (  VALTREX) 500 MG tablet, Take 500 mg by  mouth as needed., Disp: , Rfl:   Constitutional Exam   Vitals:   09/11/17 1406  BP: (!) 149/89  Pulse: 67  Resp: 16  Temp: 98 F (36.7 C)  TempSrc: Oral  SpO2: 94%  Weight: 152 lb (68.9 kg)  Height: '5\' 4"'$  (1.626 m)   Assessment  Primary Diagnosis & Pertinent Problem List: The primary encounter diagnosis was Chronic low back pain (Primary Area of Pain) (Bilateral) (R>L). Diagnoses of Chronic lower extremity pain (Secondary Area of Pain) (Bilateral) (R>L), Lumbar facet syndrome (Bilateral) (R>L), and Chronic pain syndrome were also pertinent to this visit.  Status Diagnosis  Improved Resolved Controlled 1. Chronic low back pain (Primary Area of Pain) (Bilateral) (R>L)   2. Chronic lower extremity pain (Secondary Area of Pain) (Bilateral) (R>L)   3. Lumbar facet syndrome (Bilateral) (R>L)   4. Chronic pain syndrome     Problems updated and reviewed during this visit: No problems updated. Plan of Care  Pharmacotherapy (Medications Ordered): No orders of the defined types were placed in this encounter.  Medications administered today: Jaidyn Usery had no medications administered during this visit.  Procedure Orders    No procedure(s) ordered today   Lab Orders  No laboratory test(s) ordered today   Imaging Orders  No imaging studies ordered today   Referral Orders  No referral(s) requested today   Interventional management options: Planned, scheduled, and/or pending:   None at this time.    Considering:   Diagnostic bilateral lumbar facet block#3 Possible bilateral lumbar facet RFA Diagnostic bilateral sacroiliac joint block Possible bilateral sacroiliac joint RFA Diagnostic bilateral hip joint injection Possible bilateral femoral nerve +obturator nerve block Possible bilateral femoral nerve + obturator nerve RFA Diagnostic right-sided L4-5 lumbar epidural steroid injection   Palliative PRN treatment(s):   Palliative bilateral lumbar facet  block#3under fluoroscopic guidance and IV sedation   Provider-requested follow-up: Return for PRN Procedure: (B) L-FCT BLK #3 vs. RFA.  No future appointments. Primary Care Physician: Gaynelle Arabian, MD Location: [facility] Note by: Gaspar Cola, MD Date: 09/11/2017; Time: 3:25 PM

## 2017-09-11 ENCOUNTER — Other Ambulatory Visit: Payer: Self-pay

## 2017-09-11 ENCOUNTER — Ambulatory Visit: Payer: BLUE CROSS/BLUE SHIELD | Attending: Pain Medicine | Admitting: Pain Medicine

## 2017-09-11 ENCOUNTER — Encounter: Payer: Self-pay | Admitting: Pain Medicine

## 2017-09-11 VITALS — BP 149/89 | HR 67 | Temp 98.0°F | Resp 16 | Ht 64.0 in | Wt 152.0 lb

## 2017-09-11 DIAGNOSIS — M79604 Pain in right leg: Secondary | ICD-10-CM | POA: Diagnosis not present

## 2017-09-11 DIAGNOSIS — L4050 Arthropathic psoriasis, unspecified: Secondary | ICD-10-CM | POA: Diagnosis not present

## 2017-09-11 DIAGNOSIS — R200 Anesthesia of skin: Secondary | ICD-10-CM | POA: Diagnosis not present

## 2017-09-11 DIAGNOSIS — M5442 Lumbago with sciatica, left side: Secondary | ICD-10-CM

## 2017-09-11 DIAGNOSIS — M79605 Pain in left leg: Secondary | ICD-10-CM

## 2017-09-11 DIAGNOSIS — M5441 Lumbago with sciatica, right side: Secondary | ICD-10-CM

## 2017-09-11 DIAGNOSIS — Z791 Long term (current) use of non-steroidal anti-inflammatories (NSAID): Secondary | ICD-10-CM | POA: Diagnosis not present

## 2017-09-11 DIAGNOSIS — M5136 Other intervertebral disc degeneration, lumbar region: Secondary | ICD-10-CM | POA: Diagnosis not present

## 2017-09-11 DIAGNOSIS — M7918 Myalgia, other site: Secondary | ICD-10-CM | POA: Diagnosis not present

## 2017-09-11 DIAGNOSIS — G8929 Other chronic pain: Secondary | ICD-10-CM

## 2017-09-11 DIAGNOSIS — Z7952 Long term (current) use of systemic steroids: Secondary | ICD-10-CM | POA: Diagnosis not present

## 2017-09-11 DIAGNOSIS — G894 Chronic pain syndrome: Secondary | ICD-10-CM | POA: Diagnosis not present

## 2017-09-11 DIAGNOSIS — Z79899 Other long term (current) drug therapy: Secondary | ICD-10-CM | POA: Insufficient documentation

## 2017-09-11 DIAGNOSIS — M47816 Spondylosis without myelopathy or radiculopathy, lumbar region: Secondary | ICD-10-CM

## 2017-10-24 DIAGNOSIS — Z1283 Encounter for screening for malignant neoplasm of skin: Secondary | ICD-10-CM | POA: Diagnosis not present

## 2017-10-24 DIAGNOSIS — Z85828 Personal history of other malignant neoplasm of skin: Secondary | ICD-10-CM | POA: Diagnosis not present

## 2017-10-24 DIAGNOSIS — L570 Actinic keratosis: Secondary | ICD-10-CM | POA: Diagnosis not present

## 2017-10-24 DIAGNOSIS — D1801 Hemangioma of skin and subcutaneous tissue: Secondary | ICD-10-CM | POA: Diagnosis not present

## 2017-10-24 DIAGNOSIS — L812 Freckles: Secondary | ICD-10-CM | POA: Diagnosis not present

## 2017-11-25 DIAGNOSIS — Z79899 Other long term (current) drug therapy: Secondary | ICD-10-CM | POA: Diagnosis not present

## 2017-11-25 DIAGNOSIS — M2550 Pain in unspecified joint: Secondary | ICD-10-CM | POA: Diagnosis not present

## 2017-11-25 DIAGNOSIS — L4050 Arthropathic psoriasis, unspecified: Secondary | ICD-10-CM | POA: Diagnosis not present

## 2017-11-25 DIAGNOSIS — L409 Psoriasis, unspecified: Secondary | ICD-10-CM | POA: Diagnosis not present

## 2017-11-25 LAB — BASIC METABOLIC PANEL
BUN: 20 (ref 4–21)
Creatinine: 0.1 — AB (ref <=1.1)
Glucose: 144
Potassium: 4.3 (ref 3.4–5.3)
Sodium: 141 (ref 137–147)

## 2017-11-25 LAB — CBC AND DIFFERENTIAL
HCT: 41 (ref 36–46)
Hemoglobin: 14.1 (ref 12.0–16.0)
Neutrophils Absolute: 5
Platelets: 384 (ref 150–399)
WBC: 7.6

## 2017-11-25 LAB — HEPATIC FUNCTION PANEL
ALT: 25 (ref 7–35)
AST: 26 (ref 13–35)
Alkaline Phosphatase: 55 (ref 25–125)
Bilirubin, Direct: 0.6 — AB

## 2017-11-25 LAB — LIPID PANEL: LDl/HDL Ratio: 2.7

## 2018-02-06 DIAGNOSIS — Z23 Encounter for immunization: Secondary | ICD-10-CM | POA: Diagnosis not present

## 2018-03-01 IMAGING — MG 2D DIGITAL SCREENING BILATERAL MAMMOGRAM WITH CAD AND ADJUNCT TO
9 of 12 series · 9 of 28 positions shown · non-contrast
Comparison: Previous exam(s).

CLINICAL DATA: Screening.

EXAM:
2D DIGITAL SCREENING BILATERAL MAMMOGRAM WITH CAD AND ADJUNCT TOMO

[R MLO synth-2D]
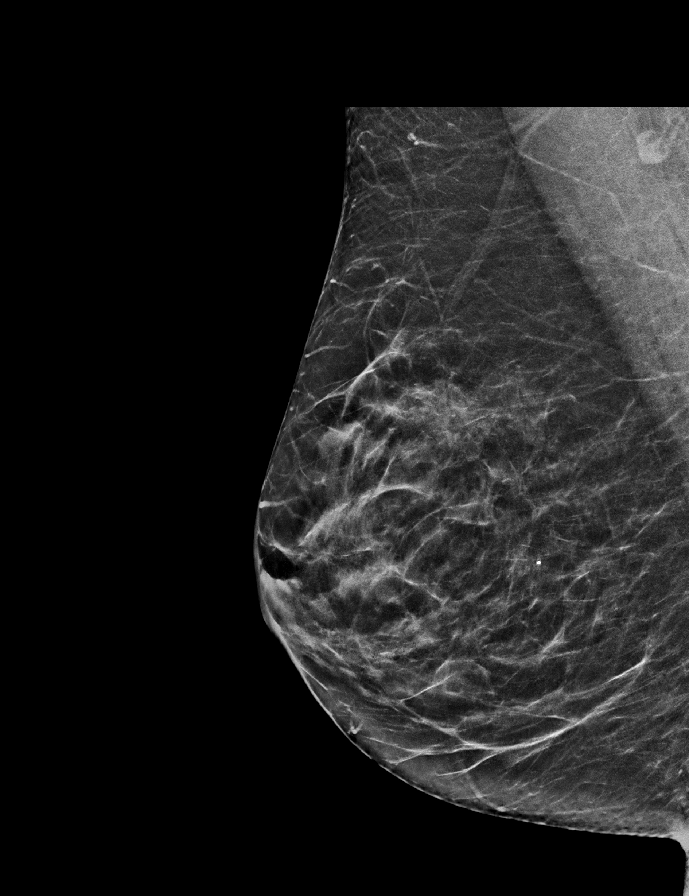

[R CC]
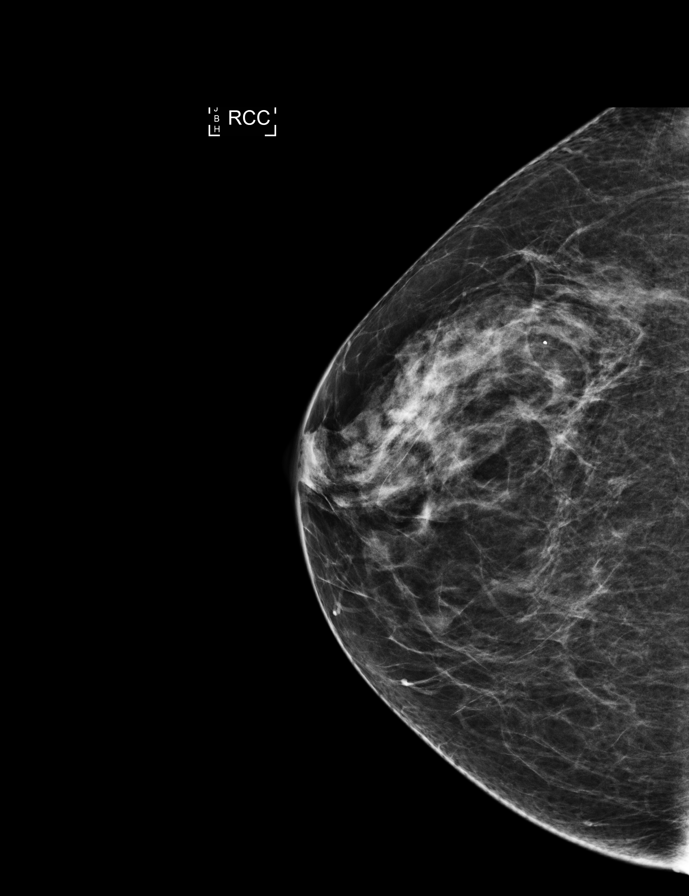

[L CC synth-2D]
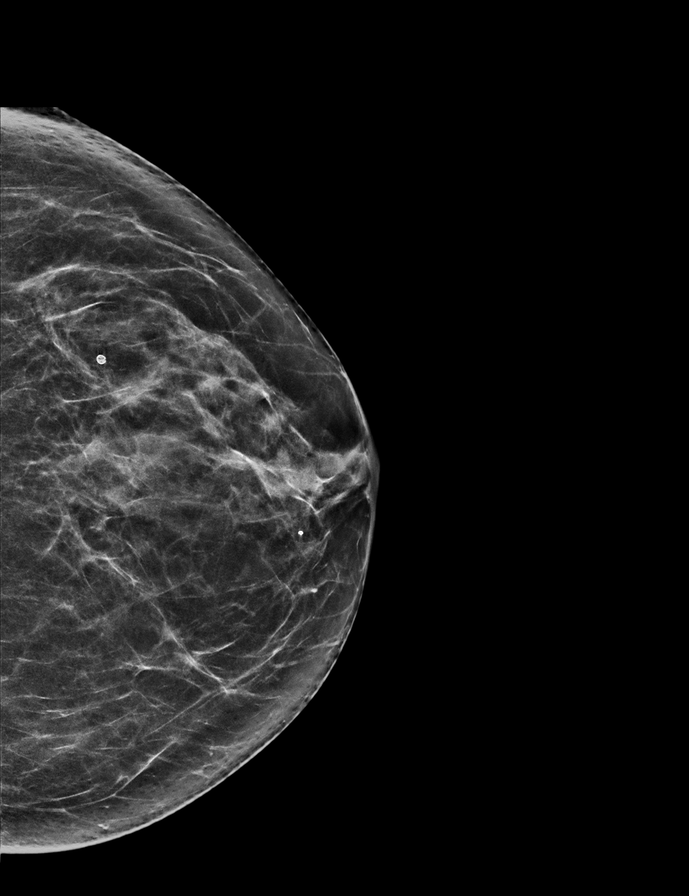

[L CC]
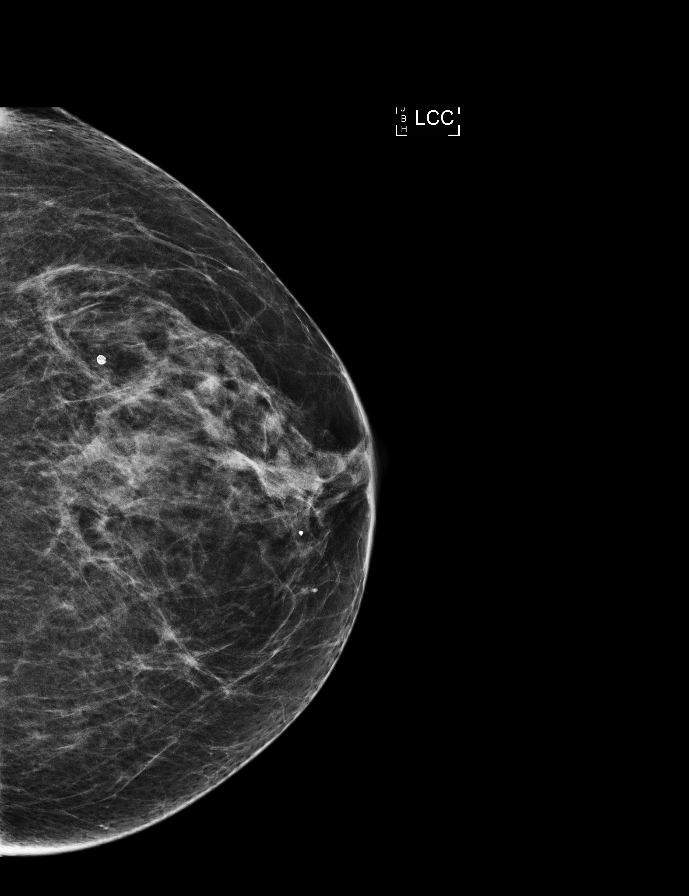

[R CC synth-2D]
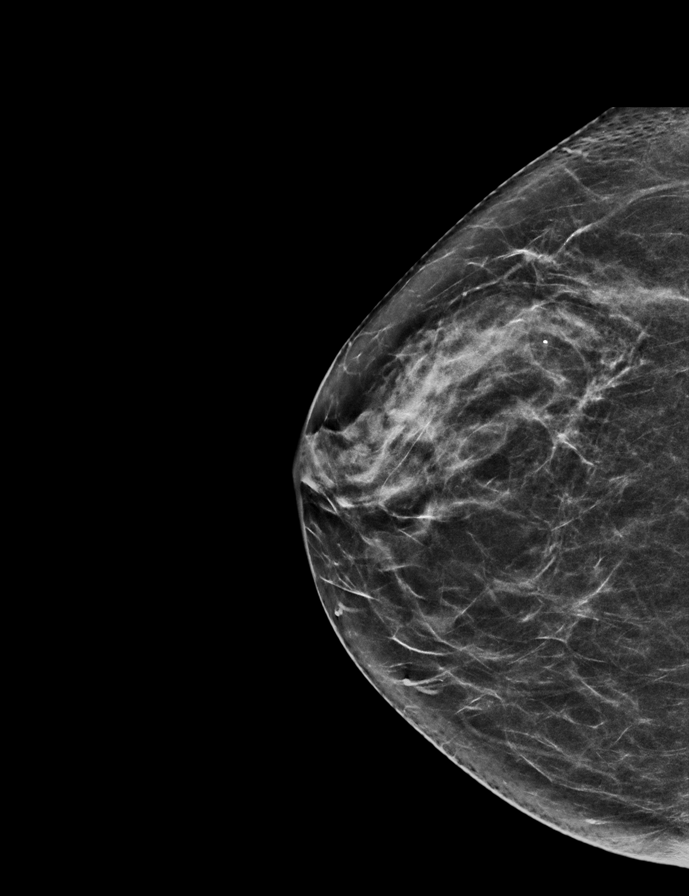

[L MLO]
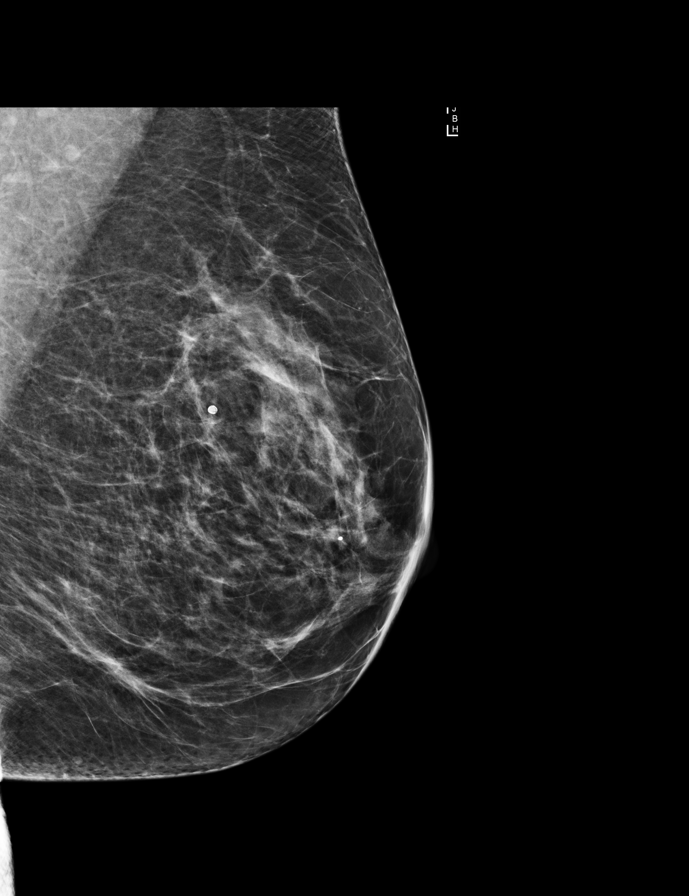

[R MLO]
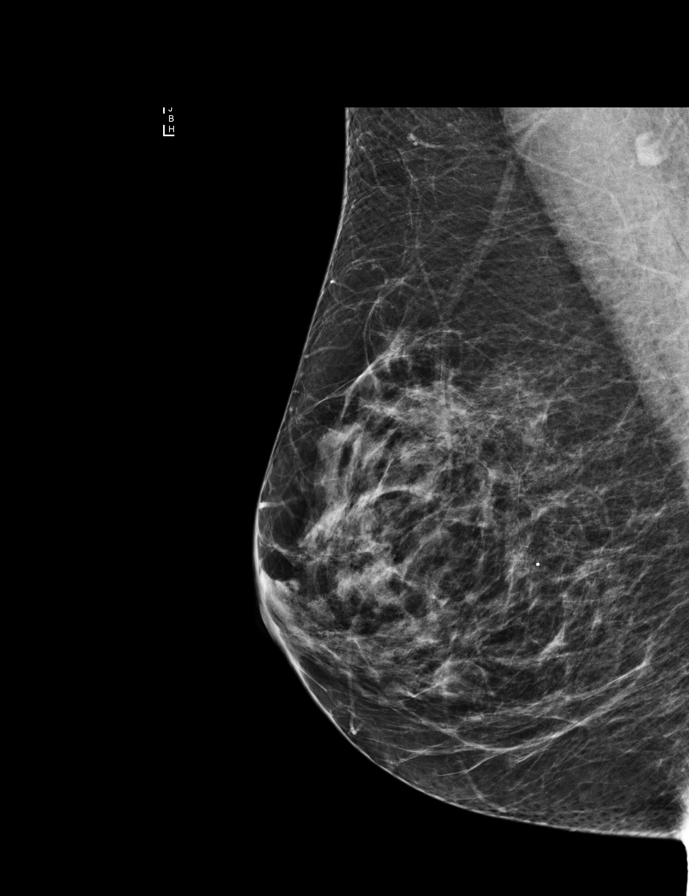

[L MLO synth-2D]
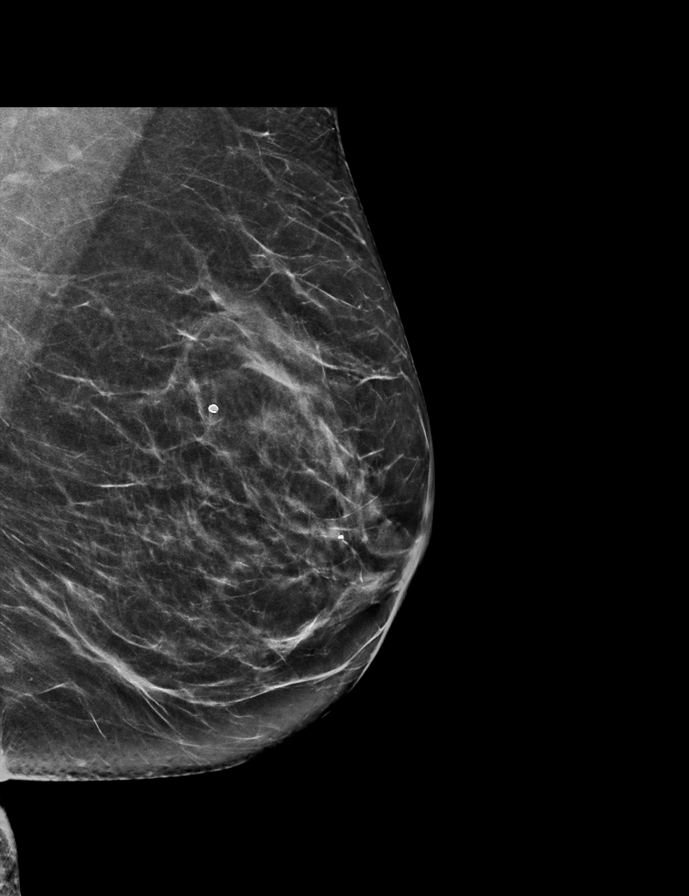

[R MLO tomo · tomo slice 31/60.0]
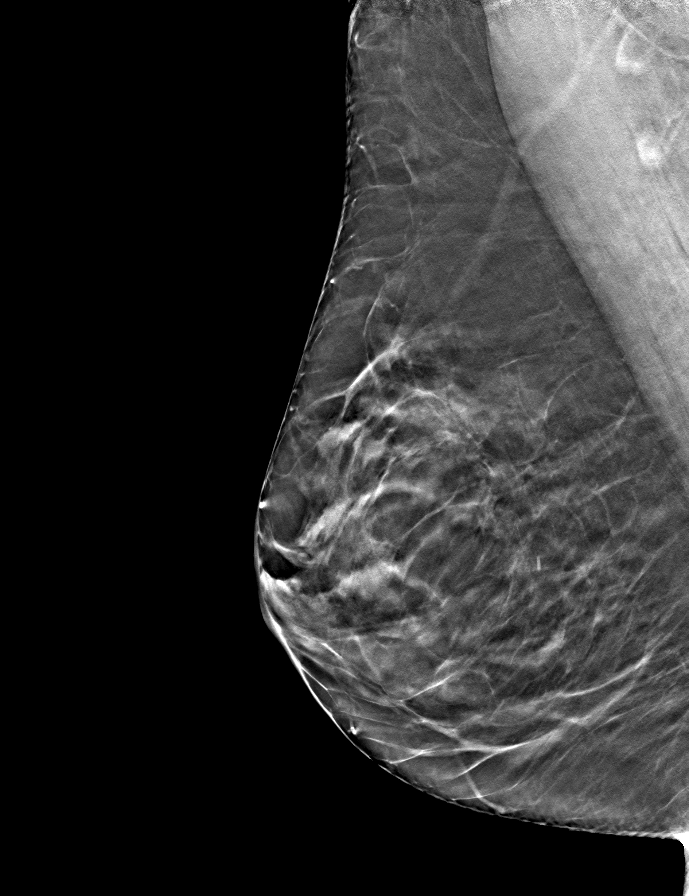

[9 of 28 positions shown; findings below may reference images not displayed]

ACR Breast Density Category b: There are scattered areas of
fibroglandular density.
FINDINGS: There are no findings suspicious for malignancy. Images were
processed with CAD.
IMPRESSION: No mammographic evidence of malignancy. A result letter of this
screening mammogram will be mailed directly to the patient.

RECOMMENDATION:
Screening mammogram in one year. (Code:97-6-RS4)

BI-RADS CATEGORY  1: Negative.

## 2018-03-11 ENCOUNTER — Other Ambulatory Visit: Payer: Self-pay | Admitting: Family Medicine

## 2018-03-11 DIAGNOSIS — Z1231 Encounter for screening mammogram for malignant neoplasm of breast: Secondary | ICD-10-CM

## 2018-03-13 DIAGNOSIS — Z23 Encounter for immunization: Secondary | ICD-10-CM | POA: Diagnosis not present

## 2018-03-13 DIAGNOSIS — Z0000 Encounter for general adult medical examination without abnormal findings: Secondary | ICD-10-CM | POA: Diagnosis not present

## 2018-03-13 DIAGNOSIS — E7800 Pure hypercholesterolemia, unspecified: Secondary | ICD-10-CM | POA: Diagnosis not present

## 2018-03-13 DIAGNOSIS — E119 Type 2 diabetes mellitus without complications: Secondary | ICD-10-CM | POA: Diagnosis not present

## 2018-03-13 LAB — LIPID PANEL
Cholesterol: 144 (ref 0–200)
HDL: 54 (ref 35–70)
LDL Cholesterol: 67
Triglycerides: 116 (ref 40–160)

## 2018-03-13 LAB — HEMOGLOBIN A1C
Hemoglobin A1C: 6.5
Hemoglobin A1C: 6.5

## 2018-03-19 ENCOUNTER — Ambulatory Visit
Admission: RE | Admit: 2018-03-19 | Discharge: 2018-03-19 | Disposition: A | Discharge status: Home / Self Care | Payer: BLUE CROSS/BLUE SHIELD | Source: Ambulatory Visit | Attending: Family Medicine | Admitting: Family Medicine

## 2018-03-19 DIAGNOSIS — Z1231 Encounter for screening mammogram for malignant neoplasm of breast: Secondary | ICD-10-CM | POA: Diagnosis not present

## 2018-03-19 LAB — HM MAMMOGRAPHY

## 2018-04-17 ENCOUNTER — Ambulatory Visit
Admission: RE | Admit: 2018-04-17 | Discharge: 2018-04-17 | Disposition: A | Discharge status: Home / Self Care | Payer: BLUE CROSS/BLUE SHIELD | Source: Ambulatory Visit | Attending: Pain Medicine | Admitting: Pain Medicine

## 2018-04-17 ENCOUNTER — Encounter: Payer: Self-pay | Admitting: Pain Medicine

## 2018-04-17 ENCOUNTER — Other Ambulatory Visit: Payer: Self-pay

## 2018-04-17 ENCOUNTER — Ambulatory Visit (HOSPITAL_BASED_OUTPATIENT_CLINIC_OR_DEPARTMENT_OTHER): Payer: BLUE CROSS/BLUE SHIELD | Admitting: Pain Medicine

## 2018-04-17 VITALS — BP 131/73 | HR 49 | Temp 97.2°F | Resp 14 | Ht 64.0 in | Wt 154.0 lb

## 2018-04-17 DIAGNOSIS — M5136 Other intervertebral disc degeneration, lumbar region: Secondary | ICD-10-CM | POA: Diagnosis not present

## 2018-04-17 DIAGNOSIS — M5441 Lumbago with sciatica, right side: Secondary | ICD-10-CM

## 2018-04-17 DIAGNOSIS — M5442 Lumbago with sciatica, left side: Secondary | ICD-10-CM | POA: Diagnosis not present

## 2018-04-17 DIAGNOSIS — M47817 Spondylosis without myelopathy or radiculopathy, lumbosacral region: Secondary | ICD-10-CM | POA: Insufficient documentation

## 2018-04-17 DIAGNOSIS — M47816 Spondylosis without myelopathy or radiculopathy, lumbar region: Secondary | ICD-10-CM | POA: Diagnosis not present

## 2018-04-17 DIAGNOSIS — G8929 Other chronic pain: Secondary | ICD-10-CM

## 2018-04-17 MED ORDER — LIDOCAINE HCL 2 % IJ SOLN
20.0000 mL | Freq: Once | INTRAMUSCULAR | Status: AC
  Administered 2018-04-17: 400 mg

## 2018-04-17 MED ORDER — MIDAZOLAM HCL 5 MG/5ML IJ SOLN
INTRAMUSCULAR | Status: AC
  Filled 2018-04-17: qty 5

## 2018-04-17 MED ORDER — TRIAMCINOLONE ACETONIDE 40 MG/ML IJ SUSP
80.0000 mg | Freq: Once | INTRAMUSCULAR | Status: AC
  Administered 2018-04-17: 40 mg

## 2018-04-17 MED ORDER — TRIAMCINOLONE ACETONIDE 40 MG/ML IJ SUSP
INTRAMUSCULAR | Status: AC
  Filled 2018-04-17: qty 2

## 2018-04-17 MED ORDER — MIDAZOLAM HCL 5 MG/5ML IJ SOLN
1.0000 mg | INTRAMUSCULAR | Status: DC | PRN
  Administered 2018-04-17: 2 mg via INTRAVENOUS

## 2018-04-17 MED ORDER — ROPIVACAINE HCL 2 MG/ML IJ SOLN
18.0000 mL | Freq: Once | INTRAMUSCULAR | Status: AC
  Administered 2018-04-17: 18 mL via PERINEURAL

## 2018-04-17 MED ORDER — LIDOCAINE HCL 2 % IJ SOLN
INTRAMUSCULAR | Status: AC
  Filled 2018-04-17: qty 20

## 2018-04-17 MED ORDER — LACTATED RINGERS IV SOLN
1000.0000 mL | Freq: Once | INTRAVENOUS | Status: AC
  Administered 2018-04-17: 1000 mL via INTRAVENOUS

## 2018-04-17 MED ORDER — FENTANYL CITRATE (PF) 100 MCG/2ML IJ SOLN
25.0000 ug | INTRAMUSCULAR | Status: DC | PRN
  Administered 2018-04-17: 50 ug via INTRAVENOUS

## 2018-04-17 MED ORDER — ROPIVACAINE HCL 2 MG/ML IJ SOLN
INTRAMUSCULAR | Status: AC
  Filled 2018-04-17: qty 20

## 2018-04-17 MED ORDER — FENTANYL CITRATE (PF) 100 MCG/2ML IJ SOLN
INTRAMUSCULAR | Status: AC
  Filled 2018-04-17: qty 2

## 2018-04-17 NOTE — Patient Instructions (Addendum)
____________________________________________________________________________________________  Post-Procedure Discharge Instructions  Instructions:  Apply ice: Fill a plastic sandwich bag with crushed ice. Cover it with a small towel and apply to injection site. Apply for 15 minutes then remove x 15 minutes. Repeat sequence on day of procedure, until you go to bed. The purpose is to minimize swelling and discomfort after procedure.  Apply heat: Apply heat to procedure site starting the day following the procedure. The purpose is to treat any soreness and discomfort from the procedure.  Food intake: Start with clear liquids (like water) and advance to regular food, as tolerated.   Physical activities: Keep activities to a minimum for the first 8 hours after the procedure.   Driving: If you have received any sedation, you are not allowed to drive for 24 hours after your procedure.  Blood thinner: Restart your blood thinner 6 hours after your procedure. (Only for those taking blood thinners)  Insulin: As soon as you can eat, you may resume your normal dosing schedule. (Only for those taking insulin)  Infection prevention: Keep procedure site clean and dry.  Post-procedure Pain Diary: Extremely important that this be done correctly and accurately. Recorded information will be used to determine the next step in treatment.  Pain evaluated is that of treated area only. Do not include pain from an untreated area.  Complete every hour, on the hour, for the initial 8 hours. Set an alarm to help you do this part accurately.  Do not go to sleep and have it completed later. It will not be accurate.  Follow-up appointment: Keep your follow-up appointment after the procedure. Usually 2 weeks for most procedures. (6 weeks in the case of radiofrequency.) Bring you pain diary.   Expect:  From numbing medicine (AKA: Local Anesthetics): Numbness or decrease in pain.  Onset: Full effect within 15  minutes of injected.  Duration: It will depend on the type of local anesthetic used. On the average, 1 to 8 hours.   From steroids: Decrease in swelling or inflammation. Once inflammation is improved, relief of the pain will follow.  Onset of benefits: Depends on the amount of swelling present. The more swelling, the longer it will take for the benefits to be seen. In some cases, up to 10 days.  Duration: Steroids will stay in the system x 2 weeks. Duration of benefits will depend on multiple posibilities including persistent irritating factors.  Occasional side-effects: Facial flushing (red, warm cheeks) , cramps (if present, drink Gatorade and take over-the-counter Magnesium 450-500 mg once to twice a day).  From procedure: Some discomfort is to be expected once the numbing medicine wears off. This should be minimal if ice and heat are applied as instructed.  Call if:  You experience numbness and weakness that gets worse with time, as opposed to wearing off.  New onset bowel or bladder incontinence. (This applies to Spinal procedures only)  Emergency Numbers:  Durning business hours (Monday - Thursday, 8:00 AM - 4:00 PM) (Friday, 9:00 AM - 12:00 Noon): [phone]  After hours: [phone] ____________________________________________________________________________________________   Post-procedure Information What to expect: Most procedures involve the use of a local anesthetic (numbing medicine), and a steroid (anti-inflammatory medicine).  The local anesthetics may cause temporary numbness and weakness of the legs or arms, depending on the location of the block. This numbness/weakness may last 4-6 hours, depending on the local anesthetic used. In rare instances, it can last up to 24 hours. While numb, you must be very careful not to  injure the extremity.  After any procedure, you could expect the pain to get better within 15-20 minutes. This relief is temporary and may  last 4-6 hours. Once the local anesthetics wears off, you could experience discomfort, possibly more than usual, for up to 10 (ten) days. In the case of radiofrequencies, it may last up to 6 weeks. Surgeries may take up to 8 weeks for the healing process. The discomfort is due to the irritation caused by needles going through skin and muscle. To minimize the discomfort, we recommend using ice the first day, and heat from then on. The ice should be applied for 15 minutes on, and 15 minutes off. Keep repeating this cycle until bedtime. Avoid applying the ice directly to the skin, to prevent frostbite. Heat should be used daily, until the pain improves (4-10 days). Be careful not to burn yourself.  Occasionally you may experience muscle spasms or cramps. These occur as a consequence of the irritation caused by the needle sticks to the muscle and the blood that will inevitably be lost into the surrounding muscle tissue. Blood tends to be very irritating to tissues, which tend to react by going into spasm. These spasms may start the same day of your procedure, but they may also take days to develop. This late onset type of spasm or cramp is usually caused by electrolyte imbalances triggered by the steroids, at the level of the kidney. Cramps and spasms tend to respond well to muscle relaxants, multivitamins (some are triggered by the procedure, but may have their origins in vitamin deficiencies), and "Gatorade", or any sports drinks that can replenish any electrolyte imbalances. (If you are a diabetic, ask your pharmacist to get you a sugar-free brand.) Warm showers or baths may also be helpful. Stretching exercises are highly recommended. General Instructions:  Be alert for signs of possible infection: redness, swelling, heat, red streaks, elevated temperature, and/or fever. These typically appear 4 to 6 days after the procedure. Immediately notify your doctor if you experience unusual bleeding, difficulty  breathing, or loss of bowel or bladder control. If you experience increased pain, do not increase your pain medicine intake, unless instructed by your pain physician. Post-Procedure Care:  Be careful in moving about. Muscle spasms in the area of the injection may occur. Applying ice or heat to the area is often helpful. The incidence of spinal headaches after epidural injections ranges between 1.4% and 6%. If you develop a headache that does not seem to respond to conservative therapy, please let your physician know. This can be treated with an epidural blood patch.   Post-procedure numbness or redness is to be expected, however it should average 4 to 6 hours. If numbness and weakness of your extremities begins to develop 4 to 6 hours after your procedure, and is felt to be progressing and worsening, immediately contact your physician.   Diet:  If you experience nausea, do not eat until this sensation goes away. If you had a "Stellate Ganglion Block" for upper extremity "Reflex Sympathetic Dystrophy", do not eat or drink until your hoarseness goes away. In any case, always start with liquids first and if you tolerate them well, then slowly progress to more solid foods. Activity:  For the first 4 to 6 hours after the procedure, use caution in moving about as you may experience numbness and/or weakness. Use caution in cooking, using household electrical appliances, and climbing steps. If you need to reach your Doctor call our office: [phone] Monday-Thursday 8:00  am - 4:00 PM    Fridays: Closed     In case of an emergency: In case of emergency, call 911 or go to the nearest emergency room and have the physician there call us.  Interpretation of Procedure Every nerve block has two components: a diagnostic component, and a treatment component. Unrealistic expectations are the most common causes of "perceived failure".  In a perfect world, a single nerve block should be able to completely and  permanently eliminate the pain. Sadly, the world is not perfect.  Most pain management nerve blocks are performed using local anesthetics and steroids. Steroids are responsible for any long-term benefit that you may experience. Their purpose is to decrease any chronic swelling that may exist in the area. Steroids begin to work immediately after being injected. However, most patients will not experience any benefits until 5 to 10 days after the injection, when the swelling has come down to the point where they can tell a difference. Steroids will only help if there is swelling to be treated. As such, they can assist with the diagnosis. If effective, they suggest an inflammatory component to the pain, and if ineffective, they rule out inflammation as the main cause or component of the problem. If the problem is one of mechanical compression, you will get no benefit from those steroids.   In the case of local anesthetics, they have a crucial role in the diagnosis of your condition. Most will begin to work within15 to 20 minutes after injection. The duration will depend on the type used (short- vs. Long-acting). It is of outmost importance that patients keep tract of their pain, after the procedure. To assist with this matter, a "Post-procedure Pain Diary" is provided. Make sure to complete it and to bring it back to your follow-up appointment.  As long as the patient keeps accurate, detailed records of their symptoms after every procedure, and returns to have those interpreted, every procedure will provide [name] with invaluable information. Even a block that does not provide the patient with any relief, will always provide [name] with information about the mechanism and the origin of the pain. The only time a nerve block can be considered a waste of time is when patients do not keep track of the results, or do not keep their post-procedure appointment.  Reporting the results back to your physician The Pain  Score  Pain is a subjective complaint. It cannot be seen, touched, or measured. We depend entirely on the patient's report of the pain in order to assess your condition and treatment. To evaluate the pain, we use a pain scale, where "0" means "No Pain", and a "10" is "the worst possible pain that you can even imagine" (i.e. something like been eaten alive by a shark or being torn apart by a lion).   You will frequently be asked to rate your pain. Please be as accurate, remember that medical decisions will be based on your responses. Please do not rate your pain above a 10. Doing so is actually interpreted as "symptom magnification" (exaggeration), as well as lack of understanding with regards to the scale. To put this into perspective, when you tell us that your pain is at a 10 (ten), what you are saying is that there is nothing we can do to make this pain any worse. (Carefully think about that.)

## 2018-04-17 NOTE — Progress Notes (Signed)
Safety precautions to be maintained throughout the outpatient stay will include: orient to surroundings, keep bed in low position, maintain call bell within reach at all times, provide assistance with transfer out of bed and ambulation.  

## 2018-04-17 NOTE — Progress Notes (Signed)
Patient's Name: Ashley Rodriguez  MRN: [id number]  Referring Provider: Gaynelle Arabian, MD  DOB: Jan 22, 1951  PCP: Gaynelle Arabian, MD  DOS: 04/17/2018  Note by: Gaspar Cola, MD  Service setting: Ambulatory outpatient  Specialty: Interventional Pain Management  Patient type: Established  Location: [facility] (AMB) Pain Management Facility  Visit type: Interventional Procedure   Primary Reason for Visit: Interventional Pain Management Treatment. CC: Back Pain (low)  Procedure:          Anesthesia, Analgesia, Anxiolysis:  Type: Lumbar Facet, Medial Branch Block(s) #4  Primary Purpose: Diagnostic Region: Posterolateral Lumbosacral Spine Level: L2, L3, L4, L5, & S1 Medial Branch Level(s). Injecting these levels blocks the L3-4, L4-5, and L5-S1 lumbar facet joints. Laterality: Bilateral  Type: Moderate (Conscious) Sedation combined with Local Anesthesia Indication(s): Analgesia and Anxiety Route: Intravenous (IV) IV Access: Secured Sedation: Meaningful verbal contact was maintained at all times during the procedure  Local Anesthetic: Lidocaine 1-2%  Position: Prone   Indications: 1. Spondylosis without myelopathy or radiculopathy, lumbosacral region   2. Lumbar facet syndrome (Bilateral) (R>L)   3. Osteoarthritis of lumbar spine   4. DDD (degenerative disc disease), lumbar   5. Chronic low back pain (Primary Area of Pain) (Bilateral) (R>L)    Pain Score: Pre-procedure: 6 /10 Post-procedure: 0-No pain/10  Pre-op Assessment:  Ashley Rodriguez is a 67 y.o. (year old), female patient, seen today for interventional treatment. She  has a past surgical history that includes Abdominal hysterectomy (2000); lipoma removed (04/2012); Colonoscopy (2015); and Neck mass excision (04/28/14). Ashley Rodriguez has a current medication list which includes the following prescription(s): acetaminophen, amlodipine, atenolol, atorvastatin, calcium carbonate, etanercept, fexofenadine, fluticasone, meloxicam, montelukast,  multivitamin with minerals, omeprazole, prednisone, valacyclovir, and enbrel sureclick, and the following Facility-Administered Medications: fentanyl and midazolam. Her primarily concern today is the Back Pain (low)  Initial Vital Signs:  Pulse/HCG Rate: 70ECG Heart Rate: (!) 51 Temp: 97.7 F (36.5 C) Resp: 18 BP: (!) 164/95 SpO2: 97 %  BMI: Estimated body mass index is 26.43 kg/m as calculated from the following:   Height as of this encounter: 5\' 4"  (1.626 m).   Weight as of this encounter: 154 lb (69.9 kg).  Risk Assessment: Allergies: Reviewed. She is allergic to codeine and sulfa antibiotics.  Allergy Precautions: None required Coagulopathies: Reviewed. None identified.  Blood-thinner therapy: None at this time Active Infection(s): Reviewed. None identified. Ashley Rodriguez is afebrile  Site Confirmation: Ashley Rodriguez was asked to confirm the procedure and laterality before marking the site Procedure checklist: Completed Consent: Before the procedure and under the influence of no sedative(s), amnesic(s), or anxiolytics, the patient was informed of the treatment options, risks and possible complications. To fulfill our ethical and legal obligations, as recommended by the American Medical Association's Code of Ethics, I have informed the patient of my clinical impression; the nature and purpose of the treatment or procedure; the risks, benefits, and possible complications of the intervention; the alternatives, including doing nothing; the risk(s) and benefit(s) of the alternative treatment(s) or procedure(s); and the risk(s) and benefit(s) of doing nothing. The patient was provided information about the general risks and possible complications associated with the procedure. These may include, but are not limited to: failure to achieve desired goals, infection, bleeding, organ or nerve damage, allergic reactions, paralysis, and death. In addition, the patient was informed of those risks and  complications associated to Spine-related procedures, such as failure to decrease pain; infection (i.e.: Meningitis, epidural or intraspinal abscess); bleeding (i.e.: epidural hematoma, subarachnoid hemorrhage,  or any other type of intraspinal or peri-dural bleeding); organ or nerve damage (i.e.: Any type of peripheral nerve, nerve root, or spinal cord injury) with subsequent damage to sensory, motor, and/or autonomic systems, resulting in permanent pain, numbness, and/or weakness of one or several areas of the body; allergic reactions; (i.e.: anaphylactic reaction); and/or death. Furthermore, the patient was informed of those risks and complications associated with the medications. These include, but are not limited to: allergic reactions (i.e.: anaphylactic or anaphylactoid reaction(s)); adrenal axis suppression; blood sugar elevation that in diabetics may result in ketoacidosis or comma; water retention that in patients with history of congestive heart failure may result in shortness of breath, pulmonary edema, and decompensation with resultant heart failure; weight gain; swelling or edema; medication-induced neural toxicity; particulate matter embolism and blood vessel occlusion with resultant organ, and/or nervous system infarction; and/or aseptic necrosis of one or more joints. Finally, the patient was informed that Medicine is not an exact science; therefore, there is also the possibility of unforeseen or unpredictable risks and/or possible complications that may result in a catastrophic outcome. The patient indicated having understood very clearly. We have given the patient no guarantees and we have made no promises. Enough time was given to the patient to ask questions, all of which were answered to the patient's satisfaction. Ashley Rodriguez has indicated that she wanted to continue with the procedure. Attestation: I, the ordering provider, attest that I have discussed with the patient the benefits, risks,  side-effects, alternatives, likelihood of achieving goals, and potential problems during recovery for the procedure that I have provided informed consent. Date  Time: 04/17/2018  7:59 AM  Pre-Procedure Preparation:  Monitoring: As per clinic protocol. Respiration, ETCO2, SpO2, BP, heart rate and rhythm monitor placed and checked for adequate function Safety Precautions: Patient was assessed for positional comfort and pressure points before starting the procedure. Time-out: I initiated and conducted the "Time-out" before starting the procedure, as per protocol. The patient was asked to participate by confirming the accuracy of the "Time Out" information. Verification of the correct person, site, and procedure were performed and confirmed by me, the nursing staff, and the patient. "Time-out" conducted as per Joint Commission's Universal Protocol (UP.01.01.01). Time: 2458  Description of Procedure:          Laterality: Bilateral. The procedure was performed in identical fashion on both sides. Levels:  L2, L3, L4, L5, & S1 Medial Branch Level(s) Area Prepped: Posterior Lumbosacral Region Prepping solution: ChloraPrep (2% chlorhexidine gluconate and 70% isopropyl alcohol) Safety Precautions: Aspiration looking for blood return was conducted prior to all injections. At no point did we inject any substances, as a needle was being advanced. Before injecting, the patient was told to immediately notify me if she was experiencing any new onset of "ringing in the ears, or metallic taste in the mouth". No attempts were made at seeking any paresthesias. Safe injection practices and needle disposal techniques used. Medications properly checked for expiration dates. SDV (single dose vial) medications used. After the completion of the procedure, all disposable equipment used was discarded in the proper designated medical waste containers. Local Anesthesia: Protocol guidelines were followed. The patient was positioned  over the fluoroscopy table. The area was prepped in the usual manner. The time-out was completed. The target area was identified using fluoroscopy. A 12-in long, straight, sterile hemostat was used with fluoroscopic guidance to locate the targets for each level blocked. Once located, the skin was marked with an approved surgical skin marker. Once  all sites were marked, the skin (epidermis, dermis, and hypodermis), as well as deeper tissues (fat, connective tissue and muscle) were infiltrated with a small amount of a short-acting local anesthetic, loaded on a 10cc syringe with a 25G, 1.5-in  Needle. An appropriate amount of time was allowed for local anesthetics to take effect before proceeding to the next step. Local Anesthetic: Lidocaine 2.0% The unused portion of the local anesthetic was discarded in the proper designated containers. Technical explanation of process:  L2 Medial Branch Nerve Block (MBB): The target area for the L2 medial branch is at the junction of the postero-lateral aspect of the superior articular process and the superior, posterior, and medial edge of the transverse process of L3. Under fluoroscopic guidance, a Quincke needle was inserted until contact was made with os over the superior postero-lateral aspect of the pedicular shadow (target area). After negative aspiration for blood, 0.5 mL of the nerve block solution was injected without difficulty or complication. The needle was removed intact. L3 Medial Branch Nerve Block (MBB): The target area for the L3 medial branch is at the junction of the postero-lateral aspect of the superior articular process and the superior, posterior, and medial edge of the transverse process of L4. Under fluoroscopic guidance, a Quincke needle was inserted until contact was made with os over the superior postero-lateral aspect of the pedicular shadow (target area). After negative aspiration for blood, 0.5 mL of the nerve block solution was injected without  difficulty or complication. The needle was removed intact. L4 Medial Branch Nerve Block (MBB): The target area for the L4 medial branch is at the junction of the postero-lateral aspect of the superior articular process and the superior, posterior, and medial edge of the transverse process of L5. Under fluoroscopic guidance, a Quincke needle was inserted until contact was made with os over the superior postero-lateral aspect of the pedicular shadow (target area). After negative aspiration for blood, 0.5 mL of the nerve block solution was injected without difficulty or complication. The needle was removed intact. L5 Medial Branch Nerve Block (MBB): The target area for the L5 medial branch is at the junction of the postero-lateral aspect of the superior articular process and the superior, posterior, and medial edge of the sacral ala. Under fluoroscopic guidance, a Quincke needle was inserted until contact was made with os over the superior postero-lateral aspect of the pedicular shadow (target area). After negative aspiration for blood, 0.5 mL of the nerve block solution was injected without difficulty or complication. The needle was removed intact. S1 Medial Branch Nerve Block (MBB): The target area for the S1 medial branch is at the posterior and inferior 6 o'clock position of the L5-S1 facet joint. Under fluoroscopic guidance, the Quincke needle inserted for the L5 MBB was redirected until contact was made with os over the inferior and postero aspect of the sacrum, at the 6 o' clock position under the L5-S1 facet joint (Target area). After negative aspiration for blood, 0.5 mL of the nerve block solution was injected without difficulty or complication. The needle was removed intact. Procedural Needles: 22-gauge, 3.5-inch, Quincke needles used for all levels. Nerve block solution: 0.2% PF-Ropivacaine + Triamcinolone (40 mg/mL) diluted to a final concentration of 4 mg of Triamcinolone/mL of Ropivacaine The unused  portion of the solution was discarded in the proper designated containers.  Once the entire procedure was completed, the treated area was cleaned, making sure to leave some of the prepping solution back to take advantage of its long  term bactericidal properties.   Illustration of the posterior view of the lumbar spine and the posterior neural structures. Laminae of L2 through S1 are labeled. DPRL5, dorsal primary ramus of L5; DPRS1, dorsal primary ramus of S1; DPR3, dorsal primary ramus of L3; FJ, facet (zygapophyseal) joint L3-L4; I, inferior articular process of L4; LB1, lateral branch of dorsal primary ramus of L1; IAB, inferior articular branches from L3 medial branch (supplies L4-L5 facet joint); IBP, intermediate branch plexus; MB3, medial branch of dorsal primary ramus of L3; NR3, third lumbar nerve root; S, superior articular process of L5; SAB, superior articular branches from L4 (supplies L4-5 facet joint also); TP3, transverse process of L3.  Vitals:   04/17/18 0855 04/17/18 0904 04/17/18 0916 04/17/18 0924  BP: 120/69 110/71 123/67 131/73  Pulse: (!) 49     Resp: 12 10 11 14   Temp:  (!) 97.2 F (36.2 C)    SpO2: 97% 92% 95% 97%  Weight:      Height:         Start Time: 0847 hrs. End Time: 0855 hrs.  Imaging Guidance (Spinal):          Type of Imaging Technique: Fluoroscopy Guidance (Spinal) Indication(s): Assistance in needle guidance and placement for procedures requiring needle placement in or near specific anatomical locations not easily accessible without such assistance. Exposure Time: Please see nurses notes. Contrast: None used. Fluoroscopic Guidance: I was personally present during the use of fluoroscopy. "Tunnel Vision Technique" used to obtain the best possible view of the target area. Parallax error corrected before commencing the procedure. "Direction-depth-direction" technique used to introduce the needle under continuous pulsed fluoroscopy. Once target was  reached, antero-posterior, oblique, and lateral fluoroscopic projection used confirm needle placement in all planes. Images permanently stored in EMR. Interpretation: No contrast injected. I personally interpreted the imaging intraoperatively. Adequate needle placement confirmed in multiple planes. Permanent images saved into the patient's record.  Antibiotic Prophylaxis:   Anti-infectives (From admission, onward)   None     Indication(s): None identified  Post-operative Assessment:  Post-procedure Vital Signs:  Pulse/HCG Rate: (!) 49(!) 50 Temp: (!) 97.2 F (36.2 C) Resp: 14 BP: 131/73 SpO2: 97 %  EBL: None  Complications: No immediate post-treatment complications observed by team, or reported by patient.  Note: The patient tolerated the entire procedure well. A repeat set of vitals were taken after the procedure and the patient was kept under observation following institutional policy, for this type of procedure. Post-procedural neurological assessment was performed, showing return to baseline, prior to discharge. The patient was provided with post-procedure discharge instructions, including a section on how to identify potential problems. Should any problems arise concerning this procedure, the patient was given instructions to immediately contact us, at any time, without hesitation. In any case, we plan to contact the patient by telephone for a follow-up status report regarding this interventional procedure.  Comments:  No additional relevant information.  Plan of Care   Interventional management options: Planned, scheduled, and/or pending:   None at this time.   This patient has been getting 4 to 7 months of pain relief with the lumbar facet blocks.  As long as she keeps getting this type of duration, there will be no need to do RFA.   Considering:   Diagnostic bilateral lumbar facet block#4 Possible bilateral lumbar facet RFA Diagnostic bilateral sacroiliac joint  block Possible bilateral sacroiliac joint RFA Diagnostic bilateral hip joint injection Possible bilateral femoral nerve +obturator nerve block Possible bilateral femoral  nerve + obturator nerve RFA Diagnostic right-sided L4-5 lumbar epidural steroid injection   Palliative PRN treatment(s):   Palliativebilateral lumbar facet block#4under fluoroscopic guidance and IV sedation    Imaging Orders     DG C-Arm 1-60 Min-No Report  Procedure Orders     LUMBAR FACET(MEDIAL BRANCH NERVE BLOCK) MBNB  Medications ordered for procedure: Meds ordered this encounter  Medications  . lidocaine (XYLOCAINE) 2 % (with pres) injection 400 mg  . midazolam (VERSED) 5 MG/5ML injection 1-2 mg    Make sure Flumazenil is available in the pyxis when using this medication. If oversedation occurs, administer 0.2 mg IV over 15 sec. If after 45 sec no response, administer 0.2 mg again over 1 min; may repeat at 1 min intervals; not to exceed 4 doses (1 mg)  . fentaNYL (SUBLIMAZE) injection 25-50 mcg    Make sure Narcan is available in the pyxis when using this medication. In the event of respiratory depression (RR< 8/min): Titrate NARCAN (naloxone) in increments of 0.1 to 0.2 mg IV at 2-3 minute intervals, until desired degree of reversal.  . lactated ringers infusion 1,000 mL  . ropivacaine (PF) 2 mg/mL (0.2%) (NAROPIN) injection 18 mL  . triamcinolone acetonide (KENALOG-40) injection 80 mg   Medications administered: We administered lidocaine, midazolam, fentaNYL, lactated ringers, ropivacaine (PF) 2 mg/mL (0.2%), and triamcinolone acetonide.  See the medical record for exact dosing, route, and time of administration.  Disposition: Discharge home  Discharge Date & Time: 04/17/2018; 0925 hrs.   Physician-requested Follow-up: Return for post-procedure eval (2 wks), w/ Dionisio David, NP.  Future Appointments  Date Time Provider [place]  05/06/2018  9:30 AM Vevelyn Francois, NP [facility] None    Primary Care Physician: Gaynelle Arabian, MD Location: [facility] Note by: Gaspar Cola, MD Date: 04/17/2018; Time: 10:25 AM  Disclaimer:  Medicine is not an exact science. The only guarantee in medicine is that nothing is guaranteed. It is important to note that the decision to proceed with this intervention was based on the information collected from the patient. The Data and conclusions were drawn from the patient's questionnaire, the interview, and the physical examination. Because the information was provided in large part by the patient, it cannot be guaranteed that it has not been purposely or unconsciously manipulated. Every effort has been made to obtain as much relevant data as possible for this evaluation. It is important to note that the conclusions that lead to this procedure are derived in large part from the available data. Always take into account that the treatment will also be dependent on availability of resources and existing treatment guidelines, considered by other Pain Management Practitioners as being common knowledge and practice, at the time of the intervention. For Medico-Legal purposes, it is also important to point out that variation in procedural techniques and pharmacological choices are the acceptable norm. The indications, contraindications, technique, and results of the above procedure should only be interpreted and judged by a Board-Certified Interventional Pain Specialist with extensive familiarity and expertise in the same exact procedure and technique.

## 2018-04-18 ENCOUNTER — Telehealth: Payer: Self-pay

## 2018-04-18 NOTE — Telephone Encounter (Signed)
Post procedure phone call.  Patient states she is doing well.  

## 2018-04-21 DIAGNOSIS — D1801 Hemangioma of skin and subcutaneous tissue: Secondary | ICD-10-CM | POA: Diagnosis not present

## 2018-04-21 DIAGNOSIS — L570 Actinic keratosis: Secondary | ICD-10-CM | POA: Diagnosis not present

## 2018-04-21 DIAGNOSIS — Z85828 Personal history of other malignant neoplasm of skin: Secondary | ICD-10-CM | POA: Diagnosis not present

## 2018-04-21 DIAGNOSIS — Z1283 Encounter for screening for malignant neoplasm of skin: Secondary | ICD-10-CM | POA: Diagnosis not present

## 2018-04-21 DIAGNOSIS — Z808 Family history of malignant neoplasm of other organs or systems: Secondary | ICD-10-CM | POA: Diagnosis not present

## 2018-05-06 ENCOUNTER — Other Ambulatory Visit: Payer: Self-pay

## 2018-05-06 ENCOUNTER — Encounter: Payer: Self-pay | Admitting: Nurse Practitioner

## 2018-05-06 ENCOUNTER — Ambulatory Visit: Payer: BLUE CROSS/BLUE SHIELD | Attending: Nurse Practitioner | Admitting: Nurse Practitioner

## 2018-05-06 VITALS — BP 149/92 | HR 57 | Temp 97.6°F | Resp 16 | Ht 64.0 in | Wt 159.0 lb

## 2018-05-06 DIAGNOSIS — M5136 Other intervertebral disc degeneration, lumbar region: Secondary | ICD-10-CM | POA: Diagnosis not present

## 2018-05-06 DIAGNOSIS — M4310 Spondylolisthesis, site unspecified: Secondary | ICD-10-CM

## 2018-05-06 DIAGNOSIS — M4300 Spondylolysis, site unspecified: Secondary | ICD-10-CM | POA: Diagnosis not present

## 2018-05-06 DIAGNOSIS — M47817 Spondylosis without myelopathy or radiculopathy, lumbosacral region: Secondary | ICD-10-CM | POA: Diagnosis not present

## 2018-05-06 DIAGNOSIS — G894 Chronic pain syndrome: Secondary | ICD-10-CM | POA: Insufficient documentation

## 2018-05-06 NOTE — Progress Notes (Signed)
Safety precautions to be maintained throughout the outpatient stay will include: orient to surroundings, keep bed in low position, maintain call bell within reach at all times, provide assistance with transfer out of bed and ambulation.  

## 2018-05-06 NOTE — Progress Notes (Signed)
Patient's Name: Ashley Rodriguez  MRN: [id number]  Referring Provider: Gaynelle Arabian, MD  DOB: 04-21-1951  PCP: Gaynelle Arabian, MD  DOS: 05/06/2018  Note by: Vevelyn Francois NP  Service setting: Ambulatory outpatient  Specialty: Interventional Pain Management  Location: [facility] (AMB) Pain Management Facility    Patient type: Established    Primary Reason(s) for Visit: Encounter for prescription drug management & post-procedure evaluation of chronic illness with mild to moderate exacerbation(Level of risk: moderate) CC: Back Pain (lower)  HPI  Ashley Rodriguez is a 68 y.o. year old, female patient, who comes today for a post-procedure evaluation and medication management. She has Psoriatic arthritis ([place]); Chronic low back pain (Primary Area of Pain) (Bilateral) (R>L); Chronic lower extremity pain (Secondary Area of Pain) (Bilateral) (R>L); Grade 1 Anterolisthesis of L5 over S1; L5 pars defect with spondylolisthesis (Bilateral); NSAID long-term use; Disorder of skeletal system; Pharmacologic therapy; Problems influencing health status; DDD (degenerative disc disease), lumbar; Lumbar facet syndrome (Bilateral) (R>L); Numbness and tingling (lower extremities) (Bilateral); Chronic pain syndrome; Chronic musculoskeletal pain; Neurogenic pain; Chronic sacroiliac joint pain (Bilateral) (R>L); Osteoarthritis of lumbar spine; and Spondylosis without myelopathy or radiculopathy, lumbosacral region on their problem list. Her primarily concern today is the Back Pain (lower)  Pain Assessment: Location: Lower Back Radiating: will have some tingling in feet bilateral Onset: More than a month ago Duration: Chronic pain Quality: Aching Severity: 0-No pain/10 (subjective, self-reported pain score)  Note: Reported level is compatible with observation.                          Effect on ADL:   Timing: Occasional Modifying factors:   BP: (!) 149/92  HR: (!) 57  Ms. Dimalanta was last seen on 04/18/2018 for a procedure.  During today's appointment we reviewed Ashley Rodriguez's post-procedure results, as well as her outpatient medication regimen. She admits that it was effective for her leg and back pain. She feels like it is most effective for her early morning pain. She has arthritis. She does wonder what her next step will be if this is no longer effective.    Further details on both, my assessment(s), as well as the proposed treatment plan, please see below.  Post-Procedure Assessment  04/07/2018 procedure: Lateral lumbar facet nerve blocks Pre-procedure pain score:  6/10 Post-procedure pain score: 0/10         Influential Factors: BMI: 27.29 kg/m Intra-procedural challenges: None observed.         Assessment challenges: None detected.              Reported side-effects: None.        Post-procedural adverse reactions or complications: None reported         Sedation: Please see nurses note. When no sedatives are used, the analgesic levels obtained are directly associated to the effectiveness of the local anesthetics. However, when sedation is provided, the level of analgesia obtained during the initial 1 hour following the intervention, is believed to be the result of a combination of factors. These factors may include, but are not limited to: 1. The effectiveness of the local anesthetics used. 2. The effects of the analgesic(s) and/or anxiolytic(s) used. 3. The degree of discomfort experienced by the patient at the time of the procedure. 4. The patients ability and reliability in recalling and recording the events. 5. The presence and influence of possible secondary gains and/or psychosocial factors. Reported result: Relief experienced during the 1st hour after the  procedure: 100 % (Ultra-Short Term Relief)            Interpretative annotation: Clinically appropriate result. Analgesia during this period is likely to be Local Anesthetic and/or IV Sedative (Analgesic/Anxiolytic) related.          Effects of local  anesthetic: The analgesic effects attained during this period are directly associated to the localized infiltration of local anesthetics and therefore cary significant diagnostic value as to the etiological location, or anatomical origin, of the pain. Expected duration of relief is directly dependent on the pharmacodynamics of the local anesthetic used. Long-acting (4-6 hours) anesthetics used.  Reported result: Relief during the next 4 to 6 hour after the procedure: 100 % (Short-Term Relief)            Interpretative annotation: Clinically appropriate result. Analgesia during this period is likely to be Local Anesthetic-related.          Long-term benefit: Defined as the period of time past the expected duration of local anesthetics (1 hour for short-acting and 4-6 hours for long-acting). With the possible exception of prolonged sympathetic blockade from the local anesthetics, benefits during this period are typically attributed to, or associated with, other factors such as analgesic sensory neuropraxia, antiinflammatory effects, or beneficial biochemical changes provided by agents other than the local anesthetics.  Reported result: Extended relief following procedure: 95 %(Still working) (Long-Term Relief)            Interpretative annotation: Clinically possible results. Good relief. No permanent benefit expected. Inflammation plays a part in the etiology to the pain.          Current benefits: Defined as reported results that persistent at this point in time.   Analgesia: 75-100 % Ashley Rodriguez reports improvement of axial and extremity symptoms. Function: Ashley Rodriguez reports improvement in function ROM: Ashley Rodriguez reports improvement in ROM Interpretative annotation: Ongoing benefit.    Effective diagnostic intervention.          Interpretation: Results would suggest a successful diagnostic intervention.                  Plan:  Please see "Plan of Care" for details.                Laboratory  Chemistry  Inflammation Markers (CRP: Acute Phase) (ESR: Chronic Phase) Lab Results  Component Value Date   CRP 0.8 01/03/2017   ESRSEDRATE 6 01/03/2017                         Rheumatology Markers No results found for: RF, ANA, LABURIC, URICUR, LYMEIGGIGMAB, LYMEABIGMQN, HLAB27                      Renal Function Markers Lab Results  Component Value Date   BUN 9 01/03/2017   CREATININE 0.80 01/03/2017   BCR 11 (L) 01/03/2017   GFRAA 90 01/03/2017   GFRNONAA 78 01/03/2017                             Hepatic Function Markers Lab Results  Component Value Date   AST 28 01/03/2017   ALT 24 01/03/2017   ALBUMIN 4.8 01/03/2017   ALKPHOS 55 01/03/2017                        Electrolytes Lab Results  Component Value Date   NA 140  01/03/2017   K 4.6 01/03/2017   CL 98 01/03/2017   CALCIUM 10.0 01/03/2017   MG 2.1 01/03/2017                        Neuropathy Markers Lab Results  Component Value Date   OYDXAJOI78 676 01/03/2017                        CNS Tests No results found for: COLORCSF, APPEARCSF, RBCCOUNTCSF, WBCCSF, POLYSCSF, LYMPHSCSF, EOSCSF, PROTEINCSF, GLUCCSF, JCVIRUS, CSFOLI, IGGCSF                      Bone Pathology Markers Lab Results  Component Value Date   25OHVITD1 50 01/03/2017   25OHVITD2 <1.0 01/03/2017   25OHVITD3 50 01/03/2017                         Coagulation Parameters No results found for: INR, LABPROT, APTT, PLT, DDIMER, LABHEMA, VITAMINK1                      Cardiovascular Markers No results found for: BNP, CKTOTAL, CKMB, TROPONINI, HGB, HCT                       CA Markers No results found for: CEA, CA125, LABCA2                      Note: Lab results reviewed.  Recent Diagnostic Imaging Results  DG C-Arm 1-60 Min-No Report Fluoroscopy was utilized by the requesting physician.  No radiographic  interpretation.   Complexity Note: Imaging results reviewed. Results shared with Ashley Rodriguez, using Layman's terms.                          Meds   Current Outpatient Medications:  .  acetaminophen (TYLENOL) 325 MG tablet, Take 325 mg by mouth every 6 (six) hours as needed., Disp: , Rfl:  .  amLODipine (NORVASC) 5 MG tablet, Take 5 mg by mouth daily., Disp: , Rfl:  .  atenolol (TENORMIN) 100 MG tablet, Take 100 mg by mouth daily., Disp: , Rfl:  .  atorvastatin (LIPITOR) 10 MG tablet, Take 1 tablet by mouth daily., Disp: , Rfl: 0 .  calcium carbonate (CALCIUM 600) 600 MG TABS tablet, Take 600 mg by mouth daily with breakfast., Disp: , Rfl:  .  ENBREL SURECLICK 50 MG/ML injection, , Disp: , Rfl: 4 .  etanercept (ENBREL) 50 MG/ML injection, Inject 50 mg into the skin once a week., Disp: , Rfl:  .  fexofenadine (ALLEGRA) 180 MG tablet, Take 180 mg by mouth daily., Disp: , Rfl:  .  fluticasone (FLONASE) 50 MCG/ACT nasal spray, , Disp: , Rfl: 4 .  meloxicam (MOBIC) 15 MG tablet, Take 15 mg by mouth daily. , Disp: , Rfl: 2 .  montelukast (SINGULAIR) 10 MG tablet, , Disp: , Rfl: 11 .  Multiple Vitamins-Minerals (MULTIVITAMIN WITH MINERALS) tablet, Take 1 tablet by mouth daily., Disp: , Rfl:  .  omeprazole (PRILOSEC) 40 MG capsule, , Disp: , Rfl: 2 .  predniSONE (DELTASONE) 5 MG tablet, Take 5 mg by mouth daily with breakfast., Disp: , Rfl:  .  valACYclovir (VALTREX) 500 MG tablet, Take 500 mg by mouth as needed., Disp: , Rfl:   ROS  Constitutional: Denies any fever  or chills Gastrointestinal: No reported hemesis, hematochezia, vomiting, or acute GI distress Musculoskeletal: Denies any acute onset joint swelling, redness, loss of ROM, or weakness Neurological: No reported episodes of acute onset apraxia, aphasia, dysarthria, agnosia, amnesia, paralysis, loss of coordination, or loss of consciousness  Allergies  Ashley Rodriguez is allergic to codeine and sulfa antibiotics.  [place]  Drug: Ashley Rodriguez  reports no history of drug use. Alcohol:  reports current alcohol use of about 4.0 standard drinks of alcohol per week. Tobacco:   reports that she has never smoked. She has never used smokeless tobacco. Medical:  has a past medical history of Arthritis, Flank lipoma (08/17/2016), Hypertension, and Neck mass (04/07/2013). Surgical: Ashley Rodriguez  has a past surgical history that includes Abdominal hysterectomy (2000); lipoma removed (04/2012); Colonoscopy (2015); and Neck mass excision (04/28/14). Family: family history is not on file.  Constitutional Exam  General appearance: Well nourished, well developed, and well hydrated. In no apparent acute distress Vitals:   05/06/18 0927  BP: (!) 149/92  Pulse: (!) 57  Resp: 16  Temp: 97.6 F (36.4 C)  SpO2: 97%  Weight: 159 lb (72.1 kg)  Height: '5\' 4"'$  (1.626 m)  Psych/Mental status: Alert, oriented x 3 (person, place, & time)       Eyes: PERLA Respiratory: No evidence of acute respiratory distress  Lumbar Spine Area Exam  Skin & Axial Inspection: No masses, redness, or swelling Alignment: Symmetrical Functional ROM: Unrestricted ROM       Stability: No instability detected Muscle Tone/Strength: Functionally intact. No obvious neuro-muscular anomalies detected. Sensory (Neurological): Unimpaired Palpation: No palpable anomalies       Provocative Tests: Hyperextension/rotation test: deferred today       Lumbar quadrant test (Kemp's test): deferred today       Lateral bending test: deferred today       Patrick's Maneuver: deferred today                    Gait & Posture Assessment  Ambulation: Unassisted Gait: Relatively normal for age and body habitus Posture: WNL   Lower Extremity Exam    Side: Right lower extremity  Side: Left lower extremity  Stability: No instability observed          Stability: No instability observed          Skin & Extremity Inspection: Skin color, temperature, and hair growth are WNL. No peripheral edema or cyanosis. No masses, redness, swelling, asymmetry, or associated skin lesions. No contractures.  Skin & Extremity Inspection: Skin color,  temperature, and hair growth are WNL. No peripheral edema or cyanosis. No masses, redness, swelling, asymmetry, or associated skin lesions. No contractures.  Functional ROM: Unrestricted ROM                  Functional ROM: Unrestricted ROM                  Muscle Tone/Strength: Functionally intact. No obvious neuro-muscular anomalies detected.  Muscle Tone/Strength: Functionally intact. No obvious neuro-muscular anomalies detected.  Sensory (Neurological): Unimpaired        Sensory (Neurological): Unimpaired        Palpation: No palpable anomalies  Palpation: No palpable anomalies   Assessment  Primary Diagnosis & Pertinent Problem List: The primary encounter diagnosis was Spondylosis without myelopathy or radiculopathy, lumbosacral region. Diagnoses of DDD (degenerative disc disease), lumbar, L5 pars defect with spondylolisthesis (Bilateral), and Chronic pain syndrome were also pertinent to this visit.  Status Diagnosis  Controlled Controlled Controlled 1. Spondylosis without myelopathy or radiculopathy, lumbosacral region   2. DDD (degenerative disc disease), lumbar   3. L5 pars defect with spondylolisthesis (Bilateral)   4. Chronic pain syndrome     Problems updated and reviewed during this visit: No problems updated. Plan of Care  Pharmacotherapy (Medications Ordered): No orders of the defined types were placed in this encounter.  New Prescriptions   No medications on file   Medications administered today: Ashley Rodriguez had no medications administered during this visit. Lab-work, procedure(s), and/or referral(s): No orders of the defined types were placed in this encounter.  Imaging and/or referral(s): None  Interventional therapies: Planned, scheduled, and/or pending:   Not at this time. Call with Prn NB    Considering: Diagnostic bilateral lumbar facet block#4 Possible bilateral lumbar facet RFA Diagnostic bilateral sacroiliac joint block Possible bilateral  sacroiliac joint RFA Diagnostic bilateral hip joint injection Possible bilateral femoral nerve +obturator nerve block Possible bilateral femoral nerve + obturator nerve RFA Diagnostic right-sided L4-5 lumbar epidural steroid injection   Palliative PRN treatment(s): Palliativebilateral lumbar facet block#4under fluoroscopic guidance and IV sedation    Provider-requested follow-up: Return for PRN Procedure.  No future appointments. Primary Care Physician: Gaynelle Arabian, MD Location: [facility] Note by: Vevelyn Francois NP Date: 05/06/2018; Time: 10:07 AM  Pain Score Disclaimer: We use the NRS-11 scale. This is a self-reported, subjective measurement of pain severity with only modest accuracy. It is used primarily to identify changes within a particular patient. It must be understood that outpatient pain scales are significantly less accurate that those used for research, where they can be applied under ideal controlled circumstances with minimal exposure to variables. In reality, the score is likely to be a combination of pain intensity and pain affect, where pain affect describes the degree of emotional arousal or changes in action readiness caused by the sensory experience of pain. Factors such as social and work situation, setting, emotional state, anxiety levels, expectation, and prior pain experience may influence pain perception and show large inter-individual differences that may also be affected by time variables.  Patient instructions provided during this appointment: Patient Instructions

## 2018-05-15 DIAGNOSIS — L03113 Cellulitis of right upper limb: Secondary | ICD-10-CM | POA: Diagnosis not present

## 2018-06-05 DIAGNOSIS — L409 Psoriasis, unspecified: Secondary | ICD-10-CM | POA: Diagnosis not present

## 2018-06-05 DIAGNOSIS — L4050 Arthropathic psoriasis, unspecified: Secondary | ICD-10-CM | POA: Diagnosis not present

## 2018-06-05 DIAGNOSIS — M2550 Pain in unspecified joint: Secondary | ICD-10-CM | POA: Diagnosis not present

## 2018-06-05 DIAGNOSIS — Z79899 Other long term (current) drug therapy: Secondary | ICD-10-CM | POA: Diagnosis not present

## 2018-07-14 ENCOUNTER — Encounter

## 2018-07-14 ENCOUNTER — Ambulatory Visit: Payer: BLUE CROSS/BLUE SHIELD | Admitting: Podiatry

## 2018-07-14 ENCOUNTER — Ambulatory Visit (INDEPENDENT_AMBULATORY_CARE_PROVIDER_SITE_OTHER): Payer: BLUE CROSS/BLUE SHIELD

## 2018-07-14 ENCOUNTER — Encounter: Payer: Self-pay | Admitting: Podiatry

## 2018-07-14 ENCOUNTER — Other Ambulatory Visit: Payer: Self-pay

## 2018-07-14 ENCOUNTER — Other Ambulatory Visit: Payer: Self-pay | Admitting: Podiatry

## 2018-07-14 DIAGNOSIS — G629 Polyneuropathy, unspecified: Secondary | ICD-10-CM | POA: Diagnosis not present

## 2018-07-14 DIAGNOSIS — M779 Enthesopathy, unspecified: Principal | ICD-10-CM

## 2018-07-14 DIAGNOSIS — M778 Other enthesopathies, not elsewhere classified: Secondary | ICD-10-CM

## 2018-07-14 MED ORDER — GABAPENTIN 100 MG PO CAPS: 100.0000 mg | ORAL_CAPSULE | Freq: Every day | ORAL | 1 refills | Status: DC

## 2018-07-14 NOTE — Progress Notes (Signed)
She presents today for follow-up of her numbness and tingling pins-and-needles in the distal aspect of the foot stated the right is worse than the left and this seems to be worse at nighttime.  Objective: Vital signs are stable alert oriented x3 no loss of protective sensation.  Pulses remain strong palpable.  Assessment: Probable neuropathy with severe osteoarthritic changes to the foot and rheumatologic changes to the foot with her rheumatoid arthritis.  Plan: Discussed etiology pathology conservative therapy at this point time went ahead and started her on gabapentin 100 mg I will follow-up with her in 1 month she will take the medication just at nighttime.

## 2018-08-04 ENCOUNTER — Telehealth: Payer: Self-pay | Admitting: Podiatry

## 2018-08-04 NOTE — Telephone Encounter (Signed)
Please call patient, she is interested in a virtual visit

## 2018-08-05 NOTE — Telephone Encounter (Signed)
Pt called following up on call from yesterday. Please give patient a call.

## 2018-08-06 ENCOUNTER — Ambulatory Visit: Payer: BLUE CROSS/BLUE SHIELD | Admitting: Podiatry

## 2018-08-06 ENCOUNTER — Telehealth: Payer: Self-pay

## 2018-08-06 ENCOUNTER — Other Ambulatory Visit: Payer: Self-pay | Admitting: Podiatry

## 2018-08-06 MED ORDER — GABAPENTIN 300 MG PO CAPS: 300.0000 mg | ORAL_CAPSULE | Freq: Every day | ORAL | 3 refills | Status: DC

## 2018-08-06 NOTE — Telephone Encounter (Signed)
I'll send her a 300mg  cap qhs.  Fu with [name] in one month

## 2018-08-06 NOTE — Telephone Encounter (Signed)
Patient called and requested to cancel appt for today with Dr. Milinda Pointer. She felt that the medication has helped some and did not want to come out today.  She reported that her feet are about 70% better since starting the Gabapentin 100 mg qhs.  She informed me that she could at least touch her toes now, which she couldn't before.  She was wanting to know if you would increase the Gabapentin to see if it would help more.  Please advise

## 2018-08-06 NOTE — Telephone Encounter (Signed)
Patient has been notified of change in medication and she will call back in a couple of weeks to schedule her appt.

## 2018-10-01 ENCOUNTER — Ambulatory Visit (INDEPENDENT_AMBULATORY_CARE_PROVIDER_SITE_OTHER): Payer: BC Managed Care – PPO | Admitting: Family Medicine

## 2018-10-01 ENCOUNTER — Encounter: Payer: Self-pay | Admitting: Family Medicine

## 2018-10-01 ENCOUNTER — Telehealth: Payer: Self-pay

## 2018-10-01 ENCOUNTER — Telehealth: Payer: Self-pay | Admitting: *Deleted

## 2018-10-01 DIAGNOSIS — Z20828 Contact with and (suspected) exposure to other viral communicable diseases: Secondary | ICD-10-CM

## 2018-10-01 DIAGNOSIS — M5442 Lumbago with sciatica, left side: Secondary | ICD-10-CM | POA: Diagnosis not present

## 2018-10-01 DIAGNOSIS — G629 Polyneuropathy, unspecified: Secondary | ICD-10-CM | POA: Insufficient documentation

## 2018-10-01 DIAGNOSIS — M5136 Other intervertebral disc degeneration, lumbar region: Secondary | ICD-10-CM | POA: Diagnosis not present

## 2018-10-01 DIAGNOSIS — G8929 Other chronic pain: Secondary | ICD-10-CM

## 2018-10-01 DIAGNOSIS — L4050 Arthropathic psoriasis, unspecified: Secondary | ICD-10-CM

## 2018-10-01 DIAGNOSIS — E1159 Type 2 diabetes mellitus with other circulatory complications: Secondary | ICD-10-CM

## 2018-10-01 DIAGNOSIS — G6289 Other specified polyneuropathies: Secondary | ICD-10-CM

## 2018-10-01 DIAGNOSIS — I152 Hypertension secondary to endocrine disorders: Secondary | ICD-10-CM | POA: Insufficient documentation

## 2018-10-01 DIAGNOSIS — Z791 Long term (current) use of non-steroidal anti-inflammatories (NSAID): Secondary | ICD-10-CM | POA: Diagnosis not present

## 2018-10-01 DIAGNOSIS — M5441 Lumbago with sciatica, right side: Secondary | ICD-10-CM

## 2018-10-01 DIAGNOSIS — E785 Hyperlipidemia, unspecified: Secondary | ICD-10-CM

## 2018-10-01 DIAGNOSIS — E1169 Type 2 diabetes mellitus with other specified complication: Secondary | ICD-10-CM

## 2018-10-01 DIAGNOSIS — E119 Type 2 diabetes mellitus without complications: Secondary | ICD-10-CM

## 2018-10-01 DIAGNOSIS — I10 Essential (primary) hypertension: Secondary | ICD-10-CM

## 2018-10-01 DIAGNOSIS — Z20822 Contact with and (suspected) exposure to covid-19: Secondary | ICD-10-CM

## 2018-10-01 HISTORY — DX: Type 2 diabetes mellitus without complications: E11.9

## 2018-10-01 NOTE — Telephone Encounter (Signed)
Patient wants nurse to call about upcoming appt

## 2018-10-01 NOTE — Telephone Encounter (Addendum)
Contacted pt to schedule appointment for COVID testing; she states that the pain management office has already scheduled her for testing on 10/06/2018; she would like to wait until then to be tested; will route to provider for notification.

## 2018-10-01 NOTE — Progress Notes (Signed)
Patient: Ashley [facility], Female    DOB: November 20, 1950, 68 y.o.   MRN: [id number] Visit Date: 10/02/2018  Today's Provider: Lavon Paganini, MD   Chief Complaint  Patient presents with  . New Patient (Initial Visit)   Subjective:  I, Porsha McClurkin CMA, am acting as a scribe for Lavon Paganini, MD.   Virtual Visit via Video Note  I connected with Ashley [facility] on 10/02/18 at  2:00 PM EDT by a video enabled telemedicine application and verified that I am speaking with the correct person using two identifiers.  Patient location: home Provider location: [facility] involved in the visit: patient, provider    I discussed the limitations of evaluation and management by telemedicine and the availability of in person appointments. The patient expressed understanding and agreed to proceed.    New Patient Appointment Ashley Rodriguez is a 68 y.o. female who presents today for new patient appointment. She feels fairly well due to pain. She reports exercising walking. She reports she is sleeping fairly well.  Was seeing Dr Gaynelle Arabian at [facility] in [place] previously, but looking to move things locally.  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  She takes care of her 59 y/o mother Lakyra Tippins) and she hasn't been able to see her since that time.  She denies any symptoms, but is wondering if she can be tested  Rheumatologist is in [place] (Dr Trudie Reed) and wonders if she can get a local referral.  Currently taking Enbrel.  She is working on Financial controller set up and thinks that Enbrel will be unaffordable at that time.  Taking Prednisone 5mg  daily chronically for many years.  Has never been able to fully get off of this and would like to.  Has recurrent episodes of elbow cellulitis.  She realized during most recent episode that she needed to stop her Enbrel when she had   Has been told that blood sugar is elevated but  does not need any medications.  Suspects that it may be related to prednisone use.  Sees pain management.  Gets facet blocks which help tremendously.  Gabapentin 300mg  qhs, Mobic 15mg .  Also has DDD and peripheral neuropathy with severe OA and rheum changes to foot.  Has h/o lipomas on neck and flank that she has had removed by Dr Bary Castilla.  She has another one that is coming up now.  Allergic rhinitis: Well controlled with singulair, allegra and flonase qhs  Taking Valtrex as needed for cold sores.  Notices that sun exposure worsens these. -----------------------------------------------------------------  Last pap:2000 patient had hysterectomy. She is not sure if she still has a cervix but she was told that she didn't need pap smears anymore. Last mammogram:03/19/2018    Review of Systems  Constitutional: Negative.   HENT: Negative.   Eyes: Negative.   Respiratory: Negative.   Cardiovascular: Negative.   Gastrointestinal: Negative.   Endocrine: Negative.   Genitourinary: Negative.   Musculoskeletal: Positive for back pain.  Skin: Negative.   Allergic/Immunologic: Negative.   Neurological: Positive for headaches.  Hematological: Negative.   Psychiatric/Behavioral: Negative.     Social History She  reports that she has never smoked. She has never used smokeless tobacco. She reports current alcohol use of about 4.0 standard drinks of alcohol per week. She reports that she does not use drugs. Social History   Socioeconomic History  . Marital status: Significant Other    Spouse name: Not  on file  . Number of children: 1  . Years of education: Not on file  . Highest education level: Not on file  Occupational History  . Occupation: Marine scientist  Social Needs  . Financial resource strain: Not on file  . Food insecurity:    Worry: Not on file    Inability: Not on file  . Transportation needs:    Medical: Not on file    Non-medical: Not on file  Tobacco Use   . Smoking status: Never Smoker  . Smokeless tobacco: Never Used  Substance and Sexual Activity  . Alcohol use: Yes    Alcohol/week: 4.0 standard drinks    Types: 4 Standard drinks or equivalent per week  . Drug use: No  . Sexual activity: Not on file  Lifestyle  . Physical activity:    Days per week: Not on file    Minutes per session: Not on file  . Stress: Not on file  Relationships  . Social connections:    Talks on phone: Not on file    Gets together: Not on file    Attends religious service: Not on file    Active member of club or organization: Not on file    Attends meetings of clubs or organizations: Not on file    Relationship status: Not on file  Other Topics Concern  . Not on file  Social History Narrative  . Not on file    Patient Active Problem List   Diagnosis Date Noted  . Exposure to Covid-19 Virus 10/02/2018  . T2DM (type 2 diabetes mellitus) ([place]) 10/01/2018  . Peripheral neuropathy 10/01/2018  . Hyperlipidemia associated with type 2 diabetes mellitus ([place]) 10/01/2018  . Hypertension associated with diabetes ([place]) 10/01/2018  . Spondylosis without myelopathy or radiculopathy, lumbosacral region 08/19/2017  . Osteoarthritis of lumbar spine 04/18/2017  . Grade 1 Anterolisthesis of L5 over S1 01/16/2017  . L5 pars defect with spondylolisthesis (Bilateral) 01/16/2017  . NSAID long-term use 01/16/2017  . Disorder of skeletal system 01/16/2017  . Pharmacologic therapy 01/16/2017  . Problems influencing health status 01/16/2017  . DDD (degenerative disc disease), lumbar 01/16/2017  . Lumbar facet syndrome (Bilateral) (R>L) 01/16/2017  . Numbness and tingling (lower extremities) (Bilateral) 01/16/2017  . Chronic pain syndrome 01/16/2017  . Chronic musculoskeletal pain 01/16/2017  . Neurogenic pain 01/16/2017  . Chronic sacroiliac joint pain (Bilateral) (R>L) 01/16/2017  . Chronic low back pain (Primary Area of Pain) (Bilateral) (R>L) 01/03/2017  . Chronic  lower extremity pain (Secondary Area of Pain) (Bilateral) (R>L) 01/03/2017  . Psoriatic arthritis ([place]) 03/04/2013    Past Surgical History:  Procedure Laterality Date  . ABDOMINAL HYSTERECTOMY  2000  . COLONOSCOPY  2015  . lipoma removed  04/2012   on neck  . NECK MASS EXCISION  04/28/14   lipoma    Family History  Family Status  Relation Name Status  . Mother  (Not Specified)  . Neg Hx  (Not Specified)   Her family history includes Healthy in her mother.     Allergies  Allergen Reactions  . Codeine Rash  . Sulfa Antibiotics Rash    Previous Medications   ACETAMINOPHEN (TYLENOL) 325 MG TABLET    Take 325 mg by mouth every 6 (six) hours as needed.   AMLODIPINE (NORVASC) 5 MG TABLET    Take 5 mg by mouth daily.   ATENOLOL (TENORMIN) 100 MG TABLET    Take 50 mg by mouth daily.   ATORVASTATIN (  LIPITOR) 10 MG TABLET    Take 1 tablet by mouth daily.   CALCIUM CARBONATE (CALCIUM 600) 600 MG TABS TABLET    Take 600 mg by mouth daily with breakfast.   ENBREL SURECLICK 50 MG/ML INJECTION       ESOMEPRAZOLE (NEXIUM) 40 MG CAPSULE    Take 40 mg by mouth daily.   FEXOFENADINE (ALLEGRA) 180 MG TABLET    Take 180 mg by mouth daily.   FLUTICASONE (FLONASE) 50 MCG/ACT NASAL SPRAY       GABAPENTIN (NEURONTIN) 300 MG CAPSULE    Take 1 capsule (300 mg total) by mouth at bedtime.   MELOXICAM (MOBIC) 15 MG TABLET    Take 15 mg by mouth daily.    MONTELUKAST (SINGULAIR) 10 MG TABLET       MULTIPLE VITAMINS-MINERALS (MULTIVITAMIN WITH MINERALS) TABLET    Take 1 tablet by mouth daily.   PREDNISONE (DELTASONE) 5 MG TABLET    Take 5 mg by mouth daily with breakfast.   VALACYCLOVIR (VALTREX) 500 MG TABLET    Take 500 mg by mouth as needed.    Patient Care Team: Virginia Crews, MD as PCP - General (Family Medicine) Bary Castilla, Forest Gleason, MD (General Surgery) Gaynelle Arabian, MD as Consulting Physician (Family Medicine)      Objective:   Vitals: There were no vitals taken for this visit.     Physical Exam Constitutional:      Appearance: Normal appearance.  Pulmonary:     Effort: Pulmonary effort is normal. No respiratory distress.  Neurological:     Mental Status: She is alert and oriented to person, place, and time. Mental status is at baseline.  Psychiatric:        Mood and Affect: Mood normal.        Behavior: Behavior normal.      Depression Screen PHQ 2/9 Scores 10/01/2018 04/17/2018 09/11/2017 08/20/2017  PHQ - 2 Score 0 0 0 0  PHQ- 9 Score 4 - - -    Assessment & Plan:    Follow Up Instructions: I discussed the assessment and treatment plan with the patient. The patient was provided an opportunity to ask questions and all were answered. The patient agreed with the plan and demonstrated an understanding of the instructions.   The patient was advised to call back or seek an in-person evaluation if the symptoms worsen or if the condition fails to improve as anticipated.    Establish Care  Exercise Activities and Dietary recommendations Goals   None      There is no immunization history on file for this patient.  Health Maintenance  Topic Date Due  . HEMOGLOBIN A1C  [birth year]  . Hepatitis C Screening  04-21-1951  . FOOT EXAM  10/20/1962  . OPHTHALMOLOGY EXAM  10/20/1962  . URINE MICROALBUMIN  10/20/1962  . HIV Screening  10/20/1967  . TETANUS/TDAP  10/20/1971  . COLONOSCOPY  10/20/2002  . DEXA SCAN  10/19/2017  . PNA vac Low Risk Adult (1 of 2 - PCV13) 10/19/2017  . INFLUENZA VACCINE  11/29/2018  . MAMMOGRAM  03/19/2020     Discussed health benefits of physical activity, and encouraged her to engage in regular exercise appropriate for her age and condition.    ROI for previous PCP --------------------------------------------------------------------  Problem List Items Addressed This Visit      Cardiovascular and Mediastinum   Hypertension associated with diabetes ([place])    Reports it has been well controlled Continue current meds  Repeat  at next in person visit Discussed home monitoring        Endocrine   T2DM (type 2 diabetes mellitus) ([place])    Reportedly well controlled with diet Will review HCM and last A1c when records are received      Hyperlipidemia associated with type 2 diabetes mellitus ([place])    Not currently on a statin Will review last lipid panel from previous PCP Given DM, needs to consider statin therapy        Nervous and Auditory   Chronic low back pain (Primary Area of Pain) (Bilateral) (R>L) (Chronic)    Followed by pain management Advised patient that they may be doing procedures again and she should check to see if she can get her facet injections       Peripheral neuropathy    Related to significant Rheum disease in feet Continue Gabapentin (started by Podiatry)        Musculoskeletal and Integument   Psoriatic arthritis ([place]) - Primary (Chronic)    Followed by Rheumatology Will refer to local Rheum per patient request She has concerns regarding affordability of Enbrel when she moves to [place] and she would like to get off of chronic prednisone Need to review last DEXA from previous PCP      Relevant Orders   Ambulatory referral to Rheumatology   DDD (degenerative disc disease), lumbar (Chronic)    Followed by Pain management No changes to medication Call as above to discuss facet injections        Other   NSAID long-term use (Chronic)    2/2 chronic pain and psoriatic arthritis Need to monitor Cr q32m      Exposure to Covid-19 Virus    Though patient is asymptomatic, given her exposure and that she is high risk given immunosupression, we will go ahead with outpatient testing Advised on quarantine procedures with pending test          Return in about 3 months (around 01/01/2019) for chronic disease f/u.   The entirety of the information documented in the History of Present Illness, Review of Systems and Physical Exam were personally obtained by me. Portions  of this information were initially documented by [facility], CMA and reviewed by me for thoroughness and accuracy.    Bacigalupo, Dionne Bucy, MD MPH [facility]

## 2018-10-01 NOTE — Telephone Encounter (Signed)
OK that sounds fine.  She should quarantine for the meantime and let us know if she develops any symptoms. Thank you.

## 2018-10-02 ENCOUNTER — Other Ambulatory Visit: Payer: Self-pay | Admitting: Pain Medicine

## 2018-10-02 DIAGNOSIS — Z20822 Contact with and (suspected) exposure to covid-19: Secondary | ICD-10-CM | POA: Insufficient documentation

## 2018-10-02 DIAGNOSIS — Z01818 Encounter for other preprocedural examination: Secondary | ICD-10-CM

## 2018-10-02 DIAGNOSIS — Z20828 Contact with and (suspected) exposure to other viral communicable diseases: Secondary | ICD-10-CM | POA: Insufficient documentation

## 2018-10-02 NOTE — Assessment & Plan Note (Signed)
2/2 chronic pain and psoriatic arthritis Need to monitor Cr q46m

## 2018-10-02 NOTE — Assessment & Plan Note (Signed)
Reports it has been well controlled Continue current meds Repeat at next in person visit Discussed home monitoring

## 2018-10-02 NOTE — Assessment & Plan Note (Signed)
Followed by Rheumatology Will refer to local Rheum per patient request She has concerns regarding affordability of Enbrel when she moves to Medicare and she would like to get off of chronic prednisone Need to review last DEXA from previous PCP

## 2018-10-02 NOTE — Assessment & Plan Note (Signed)
Not currently on a statin Will review last lipid panel from previous PCP Given DM, needs to consider statin therapy

## 2018-10-02 NOTE — Assessment & Plan Note (Signed)
Though patient is asymptomatic, given her exposure and that she is high risk given immunosupression, we will go ahead with outpatient testing Advised on quarantine procedures with pending test

## 2018-10-02 NOTE — Assessment & Plan Note (Signed)
Related to significant Rheum disease in feet Continue Gabapentin (started by Podiatry)

## 2018-10-02 NOTE — Telephone Encounter (Signed)
Pt given recommendations per Dr Brita Romp, "OK that sounds fine.  She should quarantine for the meantime and let us know if she develops any symptoms. Thank you."; she verbalizes understanding; will route to provider for notification.

## 2018-10-02 NOTE — Assessment & Plan Note (Signed)
Reportedly well controlled with diet Will review HCM and last A1c when records are received

## 2018-10-02 NOTE — Assessment & Plan Note (Signed)
Followed by pain management Advised patient that they may be doing procedures again and she should check to see if she can get her facet injections

## 2018-10-02 NOTE — Assessment & Plan Note (Signed)
Followed by Pain management No changes to medication Call as above to discuss facet injections

## 2018-10-06 ENCOUNTER — Other Ambulatory Visit: Payer: Self-pay

## 2018-10-06 ENCOUNTER — Other Ambulatory Visit
Admission: RE | Admit: 2018-10-06 | Discharge: 2018-10-06 | Disposition: A | Discharge status: Home / Self Care | Payer: BC Managed Care – PPO | Source: Ambulatory Visit | Attending: Pain Medicine | Admitting: Pain Medicine

## 2018-10-06 DIAGNOSIS — Z1159 Encounter for screening for other viral diseases: Secondary | ICD-10-CM | POA: Diagnosis not present

## 2018-10-07 LAB — NOVEL CORONAVIRUS, NAA (HOSP ORDER, SEND-OUT TO REF LAB; TAT 18-24 HRS): SARS-CoV-2, NAA: NOT DETECTED

## 2018-10-08 NOTE — Progress Notes (Signed)
Patient's Name: Ashley Rodriguez  MRN: [id number]  Referring Provider: Virginia Crews, MD  DOB: [birth year]  PCP: Virginia Crews, MD  DOS: 10/09/2018  Note by: Gaspar Cola, MD  Service setting: Ambulatory outpatient  Specialty: Interventional Pain Management  Patient type: Established  Location: [facility] (AMB) Pain Management Facility  Visit type: Interventional Procedure   Primary Reason for Visit: Interventional Pain Management Treatment. CC: Back Pain (lumbar bilateral )  Procedure:          Anesthesia, Analgesia, Anxiolysis:  Type: Lumbar Facet, Medial Branch Block(s) #5  Primary Purpose: Diagnostic Region: Posterolateral Lumbosacral Spine Level: L2, L3, L4, L5, & S1 Medial Branch Level(s). Injecting these levels blocks the L3-4, L4-5, and L5-S1 lumbar facet joints. Laterality: Bilateral  Type: Moderate (Conscious) Sedation combined with Local Anesthesia Indication(s): Analgesia and Anxiety Route: Intravenous (IV) IV Access: Secured Sedation: Meaningful verbal contact was maintained at all times during the procedure  Local Anesthetic: Lidocaine 1-2%  Position: Prone   Indications: 1. Lumbar facet syndrome (Bilateral) (R>L)   2. Spondylosis without myelopathy or radiculopathy, lumbosacral region   3. Grade 1 Anterolisthesis of L5 over S1   4. L5 pars defect with spondylolisthesis (Bilateral)   5. DDD (degenerative disc disease), lumbar    Pain Score: Pre-procedure: 8 /10 Post-procedure: 4 /10  Pre-op Assessment:  Ashley Rodriguez is a 68 y.o. (year old), female patient, seen today for interventional treatment. She  has a past surgical history that includes Abdominal hysterectomy (2000); lipoma removed (04/2012); Colonoscopy (2015); and Neck mass excision (04/28/14). Ashley Rodriguez has a current medication list which includes the following prescription(s): acetaminophen, amlodipine, atenolol, atorvastatin, betamethasone dipropionate, calcium carbonate, enbrel sureclick,  esomeprazole, fexofenadine, fluticasone, gabapentin, meloxicam, montelukast, multivitamin with minerals, prednisone, and valacyclovir, and the following Facility-Administered Medications: fentanyl, lactated ringers, and midazolam. Her primarily concern today is the Back Pain (lumbar bilateral )  Initial Vital Signs:  Pulse/HCG Rate: (!) 55ECG Heart Rate: (!) 57 Temp: 98 F (36.7 C) Resp: 16 BP: (!) 107/97 SpO2: 96 %  BMI: Estimated body mass index is 25.75 kg/m as calculated from the following:   Height as of this encounter: 5\' 4"  (1.626 m).   Weight as of this encounter: 150 lb (68 kg).  Risk Assessment: Allergies: Reviewed. She is allergic to codeine and sulfa antibiotics.  Allergy Precautions: None required Coagulopathies: Reviewed. None identified.  Blood-thinner therapy: None at this time Active Infection(s): Reviewed. None identified. Ashley Rodriguez is afebrile  Site Confirmation: Ashley Rodriguez was asked to confirm the procedure and laterality before marking the site Procedure checklist: Completed Consent: Before the procedure and under the influence of no sedative(s), amnesic(s), or anxiolytics, the patient was informed of the treatment options, risks and possible complications. To fulfill our ethical and legal obligations, as recommended by the American Medical Association's Code of Ethics, I have informed the patient of my clinical impression; the nature and purpose of the treatment or procedure; the risks, benefits, and possible complications of the intervention; the alternatives, including doing nothing; the risk(s) and benefit(s) of the alternative treatment(s) or procedure(s); and the risk(s) and benefit(s) of doing nothing. The patient was provided information about the general risks and possible complications associated with the procedure. These may include, but are not limited to: failure to achieve desired goals, infection, bleeding, organ or nerve damage, allergic reactions,  paralysis, and death. In addition, the patient was informed of those risks and complications associated to Spine-related procedures, such as failure to decrease pain; infection (i.e.: Meningitis,  epidural or intraspinal abscess); bleeding (i.e.: epidural hematoma, subarachnoid hemorrhage, or any other type of intraspinal or peri-dural bleeding); organ or nerve damage (i.e.: Any type of peripheral nerve, nerve root, or spinal cord injury) with subsequent damage to sensory, motor, and/or autonomic systems, resulting in permanent pain, numbness, and/or weakness of one or several areas of the body; allergic reactions; (i.e.: anaphylactic reaction); and/or death. Furthermore, the patient was informed of those risks and complications associated with the medications. These include, but are not limited to: allergic reactions (i.e.: anaphylactic or anaphylactoid reaction(s)); adrenal axis suppression; blood sugar elevation that in diabetics may result in ketoacidosis or comma; water retention that in patients with history of congestive heart failure may result in shortness of breath, pulmonary edema, and decompensation with resultant heart failure; weight gain; swelling or edema; medication-induced neural toxicity; particulate matter embolism and blood vessel occlusion with resultant organ, and/or nervous system infarction; and/or aseptic necrosis of one or more joints. Finally, the patient was informed that Medicine is not an exact science; therefore, there is also the possibility of unforeseen or unpredictable risks and/or possible complications that may result in a catastrophic outcome. The patient indicated having understood very clearly. We have given the patient no guarantees and we have made no promises. Enough time was given to the patient to ask questions, all of which were answered to the patient's satisfaction. Ashley Rodriguez has indicated that she wanted to continue with the procedure. Attestation: I, the  ordering provider, attest that I have discussed with the patient the benefits, risks, side-effects, alternatives, likelihood of achieving goals, and potential problems during recovery for the procedure that I have provided informed consent. Date   Time: 10/09/2018 12:54 PM  Pre-Procedure Preparation:  Monitoring: As per clinic protocol. Respiration, ETCO2, SpO2, BP, heart rate and rhythm monitor placed and checked for adequate function Safety Precautions: Patient was assessed for positional comfort and pressure points before starting the procedure. Time-out: I initiated and conducted the "Time-out" before starting the procedure, as per protocol. The patient was asked to participate by confirming the accuracy of the "Time Out" information. Verification of the correct person, site, and procedure were performed and confirmed by me, the nursing staff, and the patient. "Time-out" conducted as per Joint Commission's Universal Protocol (UP.01.01.01). Time: 1343  Description of Procedure:          Laterality: Bilateral. The procedure was performed in identical fashion on both sides. Levels:  L2, L3, L4, L5, & S1 Medial Branch Level(s) Area Prepped: Posterior Lumbosacral Region Prepping solution: DuraPrep (Iodine Povacrylex [0.7% available iodine] and Isopropyl Alcohol, 74% w/w) Safety Precautions: Aspiration looking for blood return was conducted prior to all injections. At no point did we inject any substances, as a needle was being advanced. Before injecting, the patient was told to immediately notify me if she was experiencing any new onset of "ringing in the ears, or metallic taste in the mouth". No attempts were made at seeking any paresthesias. Safe injection practices and needle disposal techniques used. Medications properly checked for expiration dates. SDV (single dose vial) medications used. After the completion of the procedure, all disposable equipment used was discarded in the proper designated  medical waste containers. Local Anesthesia: Protocol guidelines were followed. The patient was positioned over the fluoroscopy table. The area was prepped in the usual manner. The time-out was completed. The target area was identified using fluoroscopy. A 12-in long, straight, sterile hemostat was used with fluoroscopic guidance to locate the targets for each level blocked.  Once located, the skin was marked with an approved surgical skin marker. Once all sites were marked, the skin (epidermis, dermis, and hypodermis), as well as deeper tissues (fat, connective tissue and muscle) were infiltrated with a small amount of a short-acting local anesthetic, loaded on a 10cc syringe with a 25G, 1.5-in  Needle. An appropriate amount of time was allowed for local anesthetics to take effect before proceeding to the next step. Local Anesthetic: Lidocaine 2.0% The unused portion of the local anesthetic was discarded in the proper designated containers. Technical explanation of process:  L2 Medial Branch Nerve Block (MBB): The target area for the L2 medial branch is at the junction of the postero-lateral aspect of the superior articular process and the superior, posterior, and medial edge of the transverse process of L3. Under fluoroscopic guidance, a Quincke needle was inserted until contact was made with os over the superior postero-lateral aspect of the pedicular shadow (target area). After negative aspiration for blood, 0.5 mL of the nerve block solution was injected without difficulty or complication. The needle was removed intact. L3 Medial Branch Nerve Block (MBB): The target area for the L3 medial branch is at the junction of the postero-lateral aspect of the superior articular process and the superior, posterior, and medial edge of the transverse process of L4. Under fluoroscopic guidance, a Quincke needle was inserted until contact was made with os over the superior postero-lateral aspect of the pedicular shadow  (target area). After negative aspiration for blood, 0.5 mL of the nerve block solution was injected without difficulty or complication. The needle was removed intact. L4 Medial Branch Nerve Block (MBB): The target area for the L4 medial branch is at the junction of the postero-lateral aspect of the superior articular process and the superior, posterior, and medial edge of the transverse process of L5. Under fluoroscopic guidance, a Quincke needle was inserted until contact was made with os over the superior postero-lateral aspect of the pedicular shadow (target area). After negative aspiration for blood, 0.5 mL of the nerve block solution was injected without difficulty or complication. The needle was removed intact. L5 Medial Branch Nerve Block (MBB): The target area for the L5 medial branch is at the junction of the postero-lateral aspect of the superior articular process and the superior, posterior, and medial edge of the sacral ala. Under fluoroscopic guidance, a Quincke needle was inserted until contact was made with os over the superior postero-lateral aspect of the pedicular shadow (target area). After negative aspiration for blood, 0.5 mL of the nerve block solution was injected without difficulty or complication. The needle was removed intact. S1 Medial Branch Nerve Block (MBB): The target area for the S1 medial branch is at the posterior and inferior 6 o'clock position of the L5-S1 facet joint. Under fluoroscopic guidance, the Quincke needle inserted for the L5 MBB was redirected until contact was made with os over the inferior and postero aspect of the sacrum, at the 6 o' clock position under the L5-S1 facet joint (Target area). After negative aspiration for blood, 0.5 mL of the nerve block solution was injected without difficulty or complication. The needle was removed intact.  Nerve block solution: 0.2% PF-Ropivacaine + Triamcinolone (40 mg/mL) diluted to a final concentration of 4 mg of  Triamcinolone/mL of Ropivacaine The unused portion of the solution was discarded in the proper designated containers. Procedural Needles: 22-gauge, 3.5-inch, Quincke needles used for all levels.  Once the entire procedure was completed, the treated area was cleaned, making sure  to leave some of the prepping solution back to take advantage of its long term bactericidal properties.   Illustration of the posterior view of the lumbar spine and the posterior neural structures. Laminae of L2 through S1 are labeled. DPRL5, dorsal primary ramus of L5; DPRS1, dorsal primary ramus of S1; DPR3, dorsal primary ramus of L3; FJ, facet (zygapophyseal) joint L3-L4; I, inferior articular process of L4; LB1, lateral branch of dorsal primary ramus of L1; IAB, inferior articular branches from L3 medial branch (supplies L4-L5 facet joint); IBP, intermediate branch plexus; MB3, medial branch of dorsal primary ramus of L3; NR3, third lumbar nerve root; S, superior articular process of L5; SAB, superior articular branches from L4 (supplies L4-5 facet joint also); TP3, transverse process of L3.  Vitals:   10/09/18 1354 10/09/18 1405 10/09/18 1415 10/09/18 1425  BP: 97/82 123/69 123/62 123/61  Pulse:      Resp: 11 11 12 14   Temp:      TempSrc:      SpO2: 99% 97% 96% 97%  Weight:      Height:         Start Time: 1343 hrs. End Time: 1354 hrs.  Imaging Guidance (Spinal):          Type of Imaging Technique: Fluoroscopy Guidance (Spinal) Indication(s): Assistance in needle guidance and placement for procedures requiring needle placement in or near specific anatomical locations not easily accessible without such assistance. Exposure Time: Please see nurses notes. Contrast: None used. Fluoroscopic Guidance: I was personally present during the use of fluoroscopy. "Tunnel Vision Technique" used to obtain the best possible view of the target area. Parallax error corrected before commencing the procedure.  "Direction-depth-direction" technique used to introduce the needle under continuous pulsed fluoroscopy. Once target was reached, antero-posterior, oblique, and lateral fluoroscopic projection used confirm needle placement in all planes. Images permanently stored in EMR. Interpretation: No contrast injected. I personally interpreted the imaging intraoperatively. Adequate needle placement confirmed in multiple planes. Permanent images saved into the patient's record.  Antibiotic Prophylaxis:   Anti-infectives (From admission, onward)   None     Indication(s): None identified  Post-operative Assessment:  Post-procedure Vital Signs:  Pulse/HCG Rate: (!) 55(!) 42 Temp: 98 F (36.7 C) Resp: 14 BP: 123/61 SpO2: 97 %  EBL: None  Complications: No immediate post-treatment complications observed by team, or reported by patient.  Note: The patient tolerated the entire procedure well. A repeat set of vitals were taken after the procedure and the patient was kept under observation following institutional policy, for this type of procedure. Post-procedural neurological assessment was performed, showing return to baseline, prior to discharge. The patient was provided with post-procedure discharge instructions, including a section on how to identify potential problems. Should any problems arise concerning this procedure, the patient was given instructions to immediately contact us, at any time, without hesitation. In any case, we plan to contact the patient by telephone for a follow-up status report regarding this interventional procedure.  Comments:  No additional relevant information.  Plan of Care  Orders:  Orders Placed This Encounter  Procedures   LUMBAR FACET(MEDIAL BRANCH NERVE BLOCK) MBNB    Scheduling Instructions:     Side: Bilateral     Level: L3-4, L4-5, & L5-S1 Facets (L2, L3, L4, L5, & S1 Medial Branch Nerves)     Sedation: With Sedation.     Timeframe: Today    Order Specific  Question:   Where will this procedure be performed?    Answer:  [facility]   DG PAIN CLINIC C-ARM 1-60 MIN NO REPORT    Intraoperative interpretation by procedural physician at [place].    Standing Status:   Standing    Number of Occurrences:   1    Order Specific Question:   Reason for exam:    Answer:   Assistance in needle guidance and placement for procedures requiring needle placement in or near specific anatomical locations not easily accessible without such assistance.   Provider attestation of informed consent for procedure/surgical case    I, the ordering provider, attest that I have discussed with the patient the benefits, risks, side effects, alternatives, likelihood of achieving goals and potential problems during recovery for the procedure that I have provided informed consent.    Standing Status:   Standing    Number of Occurrences:   1   Informed Consent Details: Transcribe to consent form and obtain patient signature    Standing Status:   Standing    Number of Occurrences:   1    Order Specific Question:   Procedure    Answer:   Bilateral Lumbar facet block (medial branch block) under fluoroscopic guidance. (See notes for levels.)    Order Specific Question:   Surgeon    Answer:   Rashaun Curl A. Dossie Arbour, MD    Order Specific Question:   Indication/Reason    Answer:   Bilateral low back pain with or without lower extremity pain   Medications ordered for procedure: Meds ordered this encounter  Medications   lidocaine (XYLOCAINE) 2 % (with pres) injection 400 mg   lactated ringers infusion 1,000 mL   midazolam (VERSED) 5 MG/5ML injection 1-2 mg    Make sure Flumazenil is available in the pyxis when using this medication. If oversedation occurs, administer 0.2 mg IV over 15 sec. If after 45 sec no response, administer 0.2 mg again over 1 min; may repeat at 1 min intervals; not to exceed 4 doses (1 mg)   fentaNYL (SUBLIMAZE) injection 25-50 mcg      Make sure Narcan is available in the pyxis when using this medication. In the event of respiratory depression (RR< 8/min): Titrate NARCAN (naloxone) in increments of 0.1 to 0.2 mg IV at 2-3 minute intervals, until desired degree of reversal.   ropivacaine (PF) 2 mg/mL (0.2%) (NAROPIN) injection 18 mL   triamcinolone acetonide (KENALOG-40) injection 80 mg   Medications administered: We administered lidocaine, lactated ringers, midazolam, fentaNYL, ropivacaine (PF) 2 mg/mL (0.2%), and triamcinolone acetonide.  See the medical record for exact dosing, route, and time of administration.  Disposition: Discharge home  Discharge Date & Time: 10/09/2018; 1430 hrs.   Follow-up plan:   Return in about 2 weeks (around 10/23/2018) for (VV), E/M (PP).     Future Appointments  Date Time Provider [place]  10/27/2018  2:00 PM Milinda Pointer, MD [facility] None  01/01/2019  8:20 AM Bacigalupo, Dionne Bucy, MD [facility] None   Primary Care Physician: Virginia Crews, MD Location: [facility] Note by: Gaspar Cola, MD Date: 10/09/2018; Time: 2:30 PM  Disclaimer:  Medicine is not an Chief Strategy Officer. The only guarantee in medicine is that nothing is guaranteed. It is important to note that the decision to proceed with this intervention was based on the information collected from the patient. The Data and conclusions were drawn from the patient's questionnaire, the interview, and the physical examination. Because the information was provided in large part by the patient,  it cannot be guaranteed that it has not been purposely or unconsciously manipulated. Every effort has been made to obtain as much relevant data as possible for this evaluation. It is important to note that the conclusions that lead to this procedure are derived in large part from the available data. Always take into account that the treatment will also be dependent on availability of resources and  existing treatment guidelines, considered by other Pain Management Practitioners as being common knowledge and practice, at the time of the intervention. For Medico-Legal purposes, it is also important to point out that variation in procedural techniques and pharmacological choices are the acceptable norm. The indications, contraindications, technique, and results of the above procedure should only be interpreted and judged by a Board-Certified Interventional Pain Specialist with extensive familiarity and expertise in the same exact procedure and technique.

## 2018-10-08 NOTE — Patient Instructions (Signed)

## 2018-10-09 ENCOUNTER — Ambulatory Visit (HOSPITAL_BASED_OUTPATIENT_CLINIC_OR_DEPARTMENT_OTHER): Payer: BC Managed Care – PPO | Admitting: Pain Medicine

## 2018-10-09 ENCOUNTER — Other Ambulatory Visit: Payer: Self-pay

## 2018-10-09 ENCOUNTER — Encounter: Payer: Self-pay | Admitting: Pain Medicine

## 2018-10-09 ENCOUNTER — Ambulatory Visit
Admission: RE | Admit: 2018-10-09 | Discharge: 2018-10-09 | Disposition: A | Discharge status: Home / Self Care | Payer: BC Managed Care – PPO | Source: Ambulatory Visit | Attending: Pain Medicine | Admitting: Pain Medicine

## 2018-10-09 VITALS — BP 123/61 | HR 55 | Temp 98.0°F | Resp 14 | Ht 64.0 in | Wt 150.0 lb

## 2018-10-09 DIAGNOSIS — M5136 Other intervertebral disc degeneration, lumbar region: Secondary | ICD-10-CM | POA: Insufficient documentation

## 2018-10-09 DIAGNOSIS — M4300 Spondylolysis, site unspecified: Secondary | ICD-10-CM | POA: Insufficient documentation

## 2018-10-09 DIAGNOSIS — M4310 Spondylolisthesis, site unspecified: Secondary | ICD-10-CM | POA: Diagnosis not present

## 2018-10-09 DIAGNOSIS — M47816 Spondylosis without myelopathy or radiculopathy, lumbar region: Secondary | ICD-10-CM | POA: Diagnosis not present

## 2018-10-09 DIAGNOSIS — M47817 Spondylosis without myelopathy or radiculopathy, lumbosacral region: Secondary | ICD-10-CM | POA: Insufficient documentation

## 2018-10-09 MED ORDER — FENTANYL CITRATE (PF) 100 MCG/2ML IJ SOLN
25.0000 ug | INTRAMUSCULAR | Status: DC | PRN
  Administered 2018-10-09: 50 ug via INTRAVENOUS

## 2018-10-09 MED ORDER — MIDAZOLAM HCL 5 MG/5ML IJ SOLN
1.0000 mg | INTRAMUSCULAR | Status: DC | PRN
  Administered 2018-10-09: 2 mg via INTRAVENOUS

## 2018-10-09 MED ORDER — FENTANYL CITRATE (PF) 100 MCG/2ML IJ SOLN
INTRAMUSCULAR | Status: AC
  Filled 2018-10-09: qty 2

## 2018-10-09 MED ORDER — MIDAZOLAM HCL 5 MG/5ML IJ SOLN
INTRAMUSCULAR | Status: AC
  Filled 2018-10-09: qty 5

## 2018-10-09 MED ORDER — LIDOCAINE HCL (CARDIAC) PF 100 MG/5ML IV SOSY
PREFILLED_SYRINGE | INTRAVENOUS | Status: AC
  Filled 2018-10-09: qty 5

## 2018-10-09 MED ORDER — TRIAMCINOLONE ACETONIDE 40 MG/ML IJ SUSP
80.0000 mg | Freq: Once | INTRAMUSCULAR | Status: AC
  Administered 2018-10-09: 40 mg

## 2018-10-09 MED ORDER — LIDOCAINE HCL 2 % IJ SOLN
20.0000 mL | Freq: Once | INTRAMUSCULAR | Status: AC
  Administered 2018-10-09: 400 mg

## 2018-10-09 MED ORDER — ROPIVACAINE HCL 2 MG/ML IJ SOLN
INTRAMUSCULAR | Status: AC
  Filled 2018-10-09: qty 20

## 2018-10-09 MED ORDER — LACTATED RINGERS IV SOLN
1000.0000 mL | Freq: Once | INTRAVENOUS | Status: AC
  Administered 2018-10-09: 1000 mL via INTRAVENOUS

## 2018-10-09 MED ORDER — ROPIVACAINE HCL 2 MG/ML IJ SOLN
18.0000 mL | Freq: Once | INTRAMUSCULAR | Status: AC
  Administered 2018-10-09: 10 mL via PERINEURAL

## 2018-10-09 MED ORDER — TRIAMCINOLONE ACETONIDE 40 MG/ML IJ SUSP
INTRAMUSCULAR | Status: AC
  Filled 2018-10-09: qty 2

## 2018-10-10 ENCOUNTER — Telehealth: Payer: Self-pay

## 2018-10-10 NOTE — Telephone Encounter (Signed)
Post procedure phone.  Patient states she is doing well.

## 2018-10-15 ENCOUNTER — Other Ambulatory Visit: Payer: Self-pay

## 2018-10-15 ENCOUNTER — Encounter: Payer: Self-pay | Admitting: Podiatry

## 2018-10-15 ENCOUNTER — Ambulatory Visit (INDEPENDENT_AMBULATORY_CARE_PROVIDER_SITE_OTHER): Payer: BC Managed Care – PPO | Admitting: Podiatry

## 2018-10-15 VITALS — Temp 97.1°F

## 2018-10-15 DIAGNOSIS — Q828 Other specified congenital malformations of skin: Secondary | ICD-10-CM

## 2018-10-15 DIAGNOSIS — G629 Polyneuropathy, unspecified: Secondary | ICD-10-CM | POA: Diagnosis not present

## 2018-10-15 MED ORDER — GABAPENTIN 100 MG PO CAPS: ORAL_CAPSULE | ORAL | 1 refills | Status: DC

## 2018-10-15 NOTE — Progress Notes (Signed)
She presents today for follow-up of her neuropathy she states that is doing a whole lot better than it was a still get a little twinge that may be worse than the average neuropathy.  Objective: Vital signs are stable alert oriented x3.  No change in physical exam.  Assessment: Neuropathy.  Plan: I am going to allow her to take an as needed 100 mg of gabapentin at nighttime with the 300 mg she is already taking.  She knows to take no more than 500 mg.

## 2018-10-23 ENCOUNTER — Encounter: Payer: Self-pay | Admitting: Pain Medicine

## 2018-10-23 DIAGNOSIS — M159 Polyosteoarthritis, unspecified: Secondary | ICD-10-CM | POA: Diagnosis not present

## 2018-10-23 DIAGNOSIS — Z79899 Other long term (current) drug therapy: Secondary | ICD-10-CM | POA: Insufficient documentation

## 2018-10-23 DIAGNOSIS — Z13820 Encounter for screening for osteoporosis: Secondary | ICD-10-CM | POA: Diagnosis not present

## 2018-10-23 DIAGNOSIS — L4050 Arthropathic psoriasis, unspecified: Secondary | ICD-10-CM | POA: Diagnosis not present

## 2018-10-27 ENCOUNTER — Ambulatory Visit: Payer: Medicare Other | Attending: Pain Medicine | Admitting: Pain Medicine

## 2018-10-27 ENCOUNTER — Other Ambulatory Visit: Payer: Self-pay

## 2018-10-27 DIAGNOSIS — M4310 Spondylolisthesis, site unspecified: Secondary | ICD-10-CM | POA: Diagnosis not present

## 2018-10-27 DIAGNOSIS — G894 Chronic pain syndrome: Secondary | ICD-10-CM | POA: Diagnosis not present

## 2018-10-27 DIAGNOSIS — M5441 Lumbago with sciatica, right side: Secondary | ICD-10-CM

## 2018-10-27 DIAGNOSIS — M79604 Pain in right leg: Secondary | ICD-10-CM

## 2018-10-27 DIAGNOSIS — M47816 Spondylosis without myelopathy or radiculopathy, lumbar region: Secondary | ICD-10-CM

## 2018-10-27 DIAGNOSIS — G8929 Other chronic pain: Secondary | ICD-10-CM

## 2018-10-27 DIAGNOSIS — M4300 Spondylolysis, site unspecified: Secondary | ICD-10-CM

## 2018-10-27 DIAGNOSIS — M5442 Lumbago with sciatica, left side: Secondary | ICD-10-CM | POA: Diagnosis not present

## 2018-10-27 DIAGNOSIS — M79605 Pain in left leg: Secondary | ICD-10-CM

## 2018-10-27 NOTE — Patient Instructions (Signed)

## 2018-10-27 NOTE — Progress Notes (Signed)
Pain Management Virtual Encounter Note - Virtual Visit via Telephone Telehealth (real-time audio visits between healthcare provider and patient).   Patient's Phone No. & Preferred Pharmacy:  [phone] (home); [phone] (mobile); (Preferred) [phone] [email or web address] - [place], [place] [address] Phone: [phone] Fax: [phone]    Pre-screening note:  Our staff contacted Ashley Rodriguez and offered her an "in person", "face-to-face" appointment versus a telephone encounter. She indicated preferring the telephone encounter, at this time.   Reason for Virtual Visit: COVID-19*  Social distancing based on CDC and AMA recommendations.   I contacted Ashley Rodriguez on 10/27/2018 via telephone.      I clearly identified myself as Gaspar Cola, MD. I verified that I was speaking with the correct person using two identifiers (Name: Ashley Rodriguez, and date of birth: [birth year]).  Advanced Informed Consent I sought verbal advanced consent from Ashley Rodriguez for virtual visit interactions. I informed Ashley Rodriguez of possible security and privacy concerns, risks, and limitations associated with providing "not-in-person" medical evaluation and management services. I also informed Ashley Rodriguez of the availability of "in-person" appointments. Finally, I informed her that there would be a charge for the virtual visit and that she could be  personally, fully or partially, financially responsible for it. Ashley Rodriguez expressed understanding and agreed to proceed.   Historic Elements   Ashley Rodriguez is a 68 y.o. year old, female patient evaluated today after her last encounter by our practice on 10/10/2018. Ashley Rodriguez  has a past medical history of Flank lipoma (08/17/2016), Hypertension, Neck mass (04/07/2013), Psoriatic arthritis ([place]), Recurrent cellulitis, and T2DM (type 2 diabetes mellitus) ([facility] [facility]) (10/01/2018). She also   has a past surgical history that includes Abdominal hysterectomy (2000); lipoma removed (04/2012); Colonoscopy (2015); and Neck mass excision (04/28/14). Ashley Rodriguez has a current medication list which includes the following prescription(s): acetaminophen, amlodipine, atenolol, atorvastatin, betamethasone dipropionate, calcium carbonate, enbrel sureclick, esomeprazole, fexofenadine, fluticasone, gabapentin, gabapentin, meloxicam, montelukast, multivitamin with minerals, prednisone, and valacyclovir. She  reports that she has never smoked. She has never used smokeless tobacco. She reports current alcohol use of about 4.0 standard drinks of alcohol per week. She reports that she does not use drugs. Ashley Rodriguez is allergic to codeine and sulfa antibiotics.   HPI  Today, she is being contacted for a post-procedure assessment.  Once again, the patient indicates having had great relief with the injection.  Unfortunately, it simply just keeps coming back.  This last injection was done on 10/09/2018 but the one before was done on 04/17/18.  Prior to that he was on 08/20/2017.  Every single time the patient gets similar results and because he has been lasting more than 5 months at a time, we have kept it to just blocks.  However, there.  Time between injections has been slowly decreasing and today we have talked about the possibility of considering radiofrequency ablation.  The patient is interested in doing that so as to hopefully get longer lasting benefit.  Post-Procedure Evaluation  Procedure: Diagnostic bilateral lumbar facet block #5 under fluoroscopic guidance and IV sedation Pre-procedure pain level:  8/10 Post-procedure: 4/10 50% relief  Sedation: Please see nurses note.  Effectiveness during initial hour after procedure(Ultra-Short Term Relief):   100%  Local anesthetic used: Long-acting (4-6 hours) Effectiveness: Defined as any analgesic benefit obtained secondary to the administration of local  anesthetics. This carries significant diagnostic value as to the etiological location, or anatomical origin, of  the pain. Duration of benefit is expected to coincide with the duration of the local anesthetic used.  Effectiveness during initial 4-6 hours after procedure(Short-Term Relief):   100%  Long-term benefit: Defined as any relief past the pharmacologic duration of the local anesthetics.  Effectiveness past the initial 6 hours after procedure(Long-Term Relief):   95%  Current benefits: Defined as benefit that persist at this time.   Analgesia:  90-100% better Function: Ashley Rodriguez reports improvement in function ROM: Ashley Rodriguez reports improvement in ROM  Pertinent Labs   SAFETY SCREENING Profile Lab Results  Component Value Date   SARSCOV2NAA NOT DETECTED 10/06/2018   COVIDSOURCE NASOPHARYNGEAL 10/06/2018   Renal Function Lab Results  Component Value Date   BUN 9 01/03/2017   CREATININE 0.80 01/03/2017   BCR 11 (L) 01/03/2017   GFRAA 90 01/03/2017   GFRNONAA 78 01/03/2017   Hepatic Function Lab Results  Component Value Date   AST 28 01/03/2017   ALT 24 01/03/2017   ALBUMIN 4.8 01/03/2017   UDS No results found for: SUMMARY Note: Above Lab results reviewed.  Recent imaging  DG PAIN CLINIC C-ARM 1-60 MIN NO REPORT Fluoro was used, but no Radiologist interpretation will be provided.  Please refer to "NOTES" tab for provider progress note.  Assessment  The primary encounter diagnosis was Chronic pain syndrome. Diagnoses of Chronic low back pain (Primary Area of Pain) (Bilateral) (R>L), Grade 1 Anterolisthesis of L5 over S1, L5 pars defect with spondylolisthesis (Bilateral), Lumbar facet syndrome (Bilateral) (R>L), and Chronic lower extremity pain (Secondary Area of Pain) (Bilateral) (R>L) were also pertinent to this visit.  Plan of Care  I am having Ashley Rodriguez maintain her atenolol, amLODipine, calcium carbonate, multivitamin with minerals, valACYclovir,  meloxicam, montelukast, fluticasone, acetaminophen, fexofenadine, predniSONE, atorvastatin, Enbrel SureClick, gabapentin, esomeprazole, betamethasone dipropionate, and gabapentin.  Pharmacotherapy (Medications Ordered): No orders of the defined types were placed in this encounter.  Orders:  Orders Placed This Encounter  Procedures  . Radiofrequency,Lumbar    Standing Status:   Future    Standing Expiration Date:   04/27/2020    Scheduling Instructions:     Side(s): Right-sided     Level: L3-4, L4-5, & L5-S1 Facets (L2, L3, L4, L5, & S1 Medial Branch Nerves)     Sedation: With Sedation     Scheduling Timeframe: As soon as pre-approved    Order Specific Question:   Where will this procedure be performed?    Answer:   [facility]   Follow-up plan:   Return for RFA: (R) L-FCT RFA #1.    Recent Visits Date Type Provider Dept  10/09/18 Procedure visit Milinda Pointer, MD [facility]  Showing recent visits within past 90 days and meeting all other requirements   Today's Visits Date Type Provider Dept  10/27/18 Office Visit Milinda Pointer, MD [facility]  Showing today's visits and meeting all other requirements   Future Appointments No visits were found meeting these conditions.  Showing future appointments within next 90 days and meeting all other requirements   I discussed the assessment and treatment plan with the patient. The patient was provided an opportunity to ask questions and all were answered. The patient agreed with the plan and demonstrated an understanding of the instructions.  Patient advised to call back or seek an in-person evaluation if the symptoms or condition worsens.  Total duration of non-face-to-face encounter: 15 minutes.  Note by: Gaspar Cola, MD Date: 10/27/2018; Time: 3:33 PM  Note:  This dictation was prepared with Dragon dictation. Any transcriptional errors that may result from this process are  unintentional.  Disclaimer:  * Given the special circumstances of the COVID-19 pandemic, the federal government has announced that the Office for Civil Rights (OCR) will exercise its enforcement discretion and will not impose penalties on physicians using telehealth in the event of noncompliance with regulatory requirements under the [place] (HIPAA) in connection with the good faith provision of telehealth during the CLEXN-17 national public health emergency. ([place])

## 2018-11-11 DIAGNOSIS — M8588 Other specified disorders of bone density and structure, other site: Secondary | ICD-10-CM | POA: Diagnosis not present

## 2018-11-11 LAB — HM DEXA SCAN

## 2018-11-25 ENCOUNTER — Telehealth: Payer: Self-pay

## 2018-11-25 NOTE — Telephone Encounter (Signed)
Her insurance denied the RFA and she wants to know what can be done now. Please advise.

## 2018-12-09 NOTE — Telephone Encounter (Addendum)
I never heard what would we could do for her. Insurance denied her RFA. Please advise on what we can do. She has called again and is in pain. She stated the facets did not work for her. She wonders if she may need a referral to orthopaedics. She is not sure what to do at this point. First message sent 11/25/18.

## 2018-12-09 NOTE — Telephone Encounter (Signed)
Dr. Dossie Arbour has told me in the past to schedule appointment if the issue may be a lengthy discussion. Since he is not here for me to ask, and he does not respond to his message, just schedule her for VV. Thanks

## 2018-12-15 ENCOUNTER — Ambulatory Visit (INDEPENDENT_AMBULATORY_CARE_PROVIDER_SITE_OTHER): Payer: BC Managed Care – PPO | Admitting: Physician Assistant

## 2018-12-15 ENCOUNTER — Encounter: Payer: Self-pay | Admitting: Physician Assistant

## 2018-12-15 ENCOUNTER — Other Ambulatory Visit: Payer: Self-pay

## 2018-12-15 VITALS — Temp 98.0°F | Wt 147.0 lb

## 2018-12-15 DIAGNOSIS — J029 Acute pharyngitis, unspecified: Secondary | ICD-10-CM

## 2018-12-15 DIAGNOSIS — R6889 Other general symptoms and signs: Secondary | ICD-10-CM

## 2018-12-15 DIAGNOSIS — U071 COVID-19: Secondary | ICD-10-CM

## 2018-12-15 DIAGNOSIS — R059 Cough, unspecified: Secondary | ICD-10-CM

## 2018-12-15 DIAGNOSIS — Z20822 Contact with and (suspected) exposure to covid-19: Secondary | ICD-10-CM

## 2018-12-15 DIAGNOSIS — R05 Cough: Secondary | ICD-10-CM

## 2018-12-15 HISTORY — DX: COVID-19: U07.1

## 2018-12-15 NOTE — Progress Notes (Signed)
Patient: Ashley Rodriguez Female    DOB: [birth year]   68 y.o.   MRN: [id number] Visit Date: 12/15/2018  Today's Provider: Mar Daring, PA-C   Chief Complaint  Patient presents with  . Fever  . Sore Throat   Subjective:     Virtual Visit via Telephone Note  I connected with [name] on 12/15/18 at 11:20 AM EDT by telephone and verified that I am speaking with the correct person using two identifiers.  Location: Patient: Home Provider: [facility]   I discussed the limitations, risks, security and privacy concerns of performing an evaluation and management service by telephone and the availability of in person appointments. I also discussed with the patient that there may be a patient responsible charge related to this service. The patient expressed understanding and agreed to proceed.   HPI  Patient with c/o fever,sore throat this AM, cough. Symptoms started just today. No fever. She reports that she is aching a lot and is different from her arthritis.Taking Acetaminophen 500mg . No known exposure.  Allergies  Allergen Reactions  . Codeine Rash  . Sulfa Antibiotics Rash     Current Outpatient Medications:  .  acetaminophen (TYLENOL) 325 MG tablet, Take 325 mg by mouth every 6 (six) hours as needed., Disp: , Rfl:  .  amLODipine (NORVASC) 5 MG tablet, Take 5 mg by mouth daily., Disp: , Rfl:  .  atenolol (TENORMIN) 100 MG tablet, Take 50 mg by mouth daily., Disp: , Rfl:  .  atorvastatin (LIPITOR) 10 MG tablet, Take 1 tablet by mouth daily., Disp: , Rfl: 0 .  betamethasone dipropionate 0.05 % lotion, Apply 1 application topically as needed., Disp: , Rfl:  .  calcium carbonate (CALCIUM 600) 600 MG TABS tablet, Take 600 mg by mouth daily with breakfast., Disp: , Rfl:  .  ENBREL SURECLICK 50 MG/ML injection, , Disp: , Rfl: 4 .  esomeprazole (NEXIUM) 40 MG capsule, Take 40 mg by mouth daily., Disp: , Rfl:  .  fexofenadine (ALLEGRA) 180 MG tablet, Take 180 mg by mouth  daily., Disp: , Rfl:  .  fluticasone (FLONASE) 50 MCG/ACT nasal spray, , Disp: , Rfl: 4 .  gabapentin (NEURONTIN) 100 MG capsule, Add 100mg  capsule as needed along with 300mg  capsule, Disp: 90 capsule, Rfl: 1 .  gabapentin (NEURONTIN) 300 MG capsule, Take 1 capsule (300 mg total) by mouth at bedtime., Disp: 90 capsule, Rfl: 3 .  meloxicam (MOBIC) 15 MG tablet, Take 15 mg by mouth daily. , Disp: , Rfl: 2 .  montelukast (SINGULAIR) 10 MG tablet, Take 10 mg by mouth at bedtime. , Disp: , Rfl: 11 .  Multiple Vitamins-Minerals (MULTIVITAMIN WITH MINERALS) tablet, Take 1 tablet by mouth daily., Disp: , Rfl:  .  predniSONE (DELTASONE) 5 MG tablet, Take 5 mg by mouth daily with breakfast., Disp: , Rfl:  .  valACYclovir (VALTREX) 500 MG tablet, Take 500 mg by mouth as needed., Disp: , Rfl:   Review of Systems  Constitutional: Positive for fatigue. Negative for chills and fever.  HENT: Positive for sore throat. Negative for congestion, ear pain, facial swelling, postnasal drip, rhinorrhea, sinus pressure, sinus pain and trouble swallowing.   Respiratory: Positive for cough. Negative for chest tightness, shortness of breath and wheezing.   Cardiovascular: Negative for chest pain.  Gastrointestinal: Positive for nausea. Negative for abdominal pain and vomiting.  Musculoskeletal: Positive for myalgias.  Neurological: Negative for dizziness and headaches.    Social History  Tobacco Use  . Smoking status: Never Smoker  . Smokeless tobacco: Never Used  Substance Use Topics  . Alcohol use: Yes    Alcohol/week: 4.0 standard drinks    Types: 4 Standard drinks or equivalent per week      Objective:   Temp 98 F (36.7 C) (Oral)   Wt 147 lb (66.7 kg)   BMI 25.23 kg/m  Vitals:   12/15/18 1121  Temp: 98 F (36.7 C)  TempSrc: Oral  Weight: 147 lb (66.7 kg)     Physical Exam Vitals signs reviewed.  Constitutional:      General: She is not in acute distress. Pulmonary:     Effort: No  respiratory distress.  Neurological:     Mental Status: She is alert.     No results found for any visits on 12/15/18.     Assessment & Plan    1. Suspected Covid-19 Virus Infection Covid testing ordered as below. Advised to isolate until test results received. May use Mucinex for cough/congestion, Vit D, Zinc for immune system. Will f/u pending results. Call if worsening. - Novel Coronavirus, NAA ([facility])  2. Cough See above medical treatment plan. - Novel Coronavirus, NAA ([facility])  3. Sore throat See above medical treatment plan. - Novel Coronavirus, NAA ([facility])  I discussed the assessment and treatment plan with the patient. The patient was provided an opportunity to ask questions and all were answered. The patient agreed with the plan and demonstrated an understanding of the instructions.   The patient was advised to call back or seek an in-person evaluation if the symptoms worsen or if the condition fails to improve as anticipated.  I provided 14 minutes of non-face-to-face time during this encounter.    Mar Daring, PA-C  [facility]

## 2018-12-15 NOTE — Patient Instructions (Signed)
Person Under Monitoring Name: Ashley Rodriguez  Location: [address] [place] [address]   Infection Prevention Recommendations for Individuals Confirmed to have, or Being Evaluated for, 2019 Novel Coronavirus (COVID-19) Infection Who Receive Care at Home  Individuals who are confirmed to have, or are being evaluated for, COVID-19 should follow the prevention steps below until a healthcare provider or local or state health department says they can return to normal activities.  Stay home except to get medical care You should restrict activities outside your home, except for getting medical care. Do not go to work, school, or public areas, and do not use public transportation or taxis.  Call ahead before visiting your doctor Before your medical appointment, call the healthcare provider and tell them that you have, or are being evaluated for, COVID-19 infection. This will help the healthcare provider's office take steps to keep other people from getting infected. Ask your healthcare provider to call the local or state health department.  Monitor your symptoms Seek prompt medical attention if your illness is worsening (e.g., difficulty breathing). Before going to your medical appointment, call the healthcare provider and tell them that you have, or are being evaluated for, COVID-19 infection. Ask your healthcare provider to call the local or state health department.  Wear a facemask You should wear a facemask that covers your nose and mouth when you are in the same room with other people and when you visit a healthcare provider. People who live with or visit you should also wear a facemask while they are in the same room with you.  Separate yourself from other people in your home As much as possible, you should stay in a different room from other people in your home. Also, you should use a separate bathroom, if available.  Avoid sharing household items You should not share  dishes, drinking glasses, cups, eating utensils, towels, bedding, or other items with other people in your home. After using these items, you should wash them thoroughly with soap and water.  Cover your coughs and sneezes Cover your mouth and nose with a tissue when you cough or sneeze, or you can cough or sneeze into your sleeve. Throw used tissues in a lined trash can, and immediately wash your hands with soap and water for at least 20 seconds or use an alcohol-based hand rub.  Wash your [facility] your hands often and thoroughly with soap and water for at least 20 seconds. You can use an alcohol-based hand sanitizer if soap and water are not available and if your hands are not visibly dirty. Avoid touching your eyes, nose, and mouth with unwashed hands.   Prevention Steps for Caregivers and Household Members of Individuals Confirmed to have, or Being Evaluated for, COVID-19 Infection Being Cared for in the Home  If you live with, or provide care at home for, a person confirmed to have, or being evaluated for, COVID-19 infection please follow these guidelines to prevent infection:  Follow healthcare provider's instructions Make sure that you understand and can help the patient follow any healthcare provider instructions for all care.  Provide for the patient's basic needs You should help the patient with basic needs in the home and provide support for getting groceries, prescriptions, and other personal needs.  Monitor the patient's symptoms If they are getting sicker, call his or her medical provider and tell them that the patient has, or is being evaluated for, COVID-19 infection. This will help the healthcare provider's office take  steps to keep other people from getting infected. Ask the healthcare provider to call the local or state health department.  Limit the number of people who have contact with the patient  If possible, have only one caregiver for the patient.  Other  household members should stay in another home or place of residence. If this is not possible, they should stay  in another room, or be separated from the patient as much as possible. Use a separate bathroom, if available.  Restrict visitors who do not have an essential need to be in the home.  Keep older adults, very young children, and other sick people away from the patient Keep older adults, very young children, and those who have compromised immune systems or chronic health conditions away from the patient. This includes people with chronic heart, lung, or kidney conditions, diabetes, and cancer.  Ensure good ventilation Make sure that shared spaces in the home have good air flow, such as from an air conditioner or an opened window, weather permitting.  Wash your hands often  Wash your hands often and thoroughly with soap and water for at least 20 seconds. You can use an alcohol based hand sanitizer if soap and water are not available and if your hands are not visibly dirty.  Avoid touching your eyes, nose, and mouth with unwashed hands.  Use disposable paper towels to dry your hands. If not available, use dedicated cloth towels and replace them when they become wet.  Wear a facemask and gloves  Wear a disposable facemask at all times in the room and gloves when you touch or have contact with the patient's blood, body fluids, and/or secretions or excretions, such as sweat, saliva, sputum, nasal mucus, vomit, urine, or feces.  Ensure the mask fits over your nose and mouth tightly, and do not touch it during use.  Throw out disposable facemasks and gloves after using them. Do not reuse.  Wash your hands immediately after removing your facemask and gloves.  If your personal clothing becomes contaminated, carefully remove clothing and launder. Wash your hands after handling contaminated clothing.  Place all used disposable facemasks, gloves, and other waste in a lined container before  disposing them with other household waste.  Remove gloves and wash your hands immediately after handling these items.  Do not share dishes, glasses, or other household items with the patient  Avoid sharing household items. You should not share dishes, drinking glasses, cups, eating utensils, towels, bedding, or other items with a patient who is confirmed to have, or being evaluated for, COVID-19 infection.  After the person uses these items, you should wash them thoroughly with soap and water.  Wash laundry thoroughly  Immediately remove and wash clothes or bedding that have blood, body fluids, and/or secretions or excretions, such as sweat, saliva, sputum, nasal mucus, vomit, urine, or feces, on them.  Wear gloves when handling laundry from the patient.  Read and follow directions on labels of laundry or clothing items and detergent. In general, wash and dry with the warmest temperatures recommended on the label.  Clean all areas the individual has used often  Clean all touchable surfaces, such as counters, tabletops, doorknobs, bathroom fixtures, toilets, phones, keyboards, tablets, and bedside tables, every day. Also, clean any surfaces that may have blood, body fluids, and/or secretions or excretions on them.  Wear gloves when cleaning surfaces the patient has come in contact with.  Use a diluted bleach solution (e.g., dilute bleach with 1 part bleach  and 10 parts water) or a household disinfectant with a label that says EPA-registered for coronaviruses. To make a bleach solution at home, add 1 tablespoon of bleach to 1 quart (4 cups) of water. For a larger supply, add  cup of bleach to 1 gallon (16 cups) of water.  Read labels of cleaning products and follow recommendations provided on product labels. Labels contain instructions for safe and effective use of the cleaning product including precautions you should take when applying the product, such as wearing gloves or eye protection  and making sure you have good ventilation during use of the product.  Remove gloves and wash hands immediately after cleaning.  Monitor yourself for signs and symptoms of illness Caregivers and household members are considered close contacts, should monitor their health, and will be asked to limit movement outside of the home to the extent possible. Follow the monitoring steps for close contacts listed on the symptom monitoring form.   ? If you have additional questions, contact your local health department or call the epidemiologist on call at [phone] (available 24/7). ? This guidance is subject to change. For the most up-to-date guidance from [facility], please refer to their website: [email or web address]

## 2018-12-16 ENCOUNTER — Telehealth: Payer: Self-pay | Admitting: Physician Assistant

## 2018-12-16 ENCOUNTER — Encounter: Payer: Self-pay | Admitting: Pain Medicine

## 2018-12-16 ENCOUNTER — Telehealth: Payer: Self-pay

## 2018-12-16 DIAGNOSIS — J0140 Acute pansinusitis, unspecified: Secondary | ICD-10-CM

## 2018-12-16 MED ORDER — AMOXICILLIN-POT CLAVULANATE 875-125 MG PO TABS: 1.0000 | ORAL_TABLET | Freq: Two times a day (BID) | ORAL | 0 refills | Status: DC

## 2018-12-16 NOTE — Telephone Encounter (Signed)
Mychart message sent to patient.

## 2018-12-16 NOTE — Telephone Encounter (Signed)
Augmentin sent in to cover sinus infection vs strep pharyngitis.

## 2018-12-16 NOTE — Progress Notes (Signed)
Failed attempt at Virtual Visit via Telephone   Patient's Phone No. & Preferred Pharmacy:  [phone] (home); [phone] (mobile); (Preferred) [phone] [email or web address] [place], [place] [address] Phone: [phone] Fax: [phone]    I attempted to contact Ashley Rodriguez on 12/17/2018 via telephone. I was unable to complete the visit due to no one answering the phone, or unable to get a proper contact number. I was also unable to leave a message   Pharmacotherapy Assessment  Analgesic:  No opioid analgesics from our practice.      Interventional management options: Planned, scheduled, and/or pending:   None at this time.    Considering:   Possible bilateral lumbar facet RFA Diagnostic bilateral sacroiliac joint block Possible bilateral sacroiliac joint RFA Diagnostic bilateral hip joint injection Possible bilateral femoral nerve +obturator nerve block Possible bilateral femoral nerve + obturator nerve RFA Diagnostic right-sided L4-5 lumbar epidural steroid injection   Palliative PRN treatment(s):   Palliativebilateral lumbar facet block#6(diagnostic lumbar facet blocks provide the patient with 90 to 100% relief of the pain, unfortunately they did not last.  The insurance company has denied radiofrequency ablation for some unknown reason.)    Recent Visits Date Type Provider Dept  10/27/18 Office Visit Milinda Pointer, MD [facility]  10/09/18 Procedure visit Milinda Pointer, MD [facility]  Showing recent visits within past 90 days and meeting all other requirements   Today's Visits Date Type Provider Dept  12/17/18 Office Visit Milinda Pointer, MD [facility]  Showing today's visits and meeting all other requirements   Future Appointments No visits were found meeting these conditions.  Showing future appointments within next 90  days and meeting all other requirements   Note by: Gaspar Cola, MD Date: 12/17/2018; Time: 7:27 AM

## 2018-12-16 NOTE — Telephone Encounter (Signed)
Patient advised as directed below. 

## 2018-12-16 NOTE — Telephone Encounter (Signed)
Pt stated that she saw that her COVID test was negative and is requesting call back to discuss what she can try to treat her symptoms. Pt had telephone visit with Tawanna Sat 12/15/2018. Please advise. Thanks TNP

## 2018-12-16 NOTE — Telephone Encounter (Signed)
Please Review

## 2018-12-17 ENCOUNTER — Telehealth: Payer: Self-pay | Admitting: Family Medicine

## 2018-12-17 ENCOUNTER — Ambulatory Visit: Payer: BC Managed Care – PPO | Attending: Pain Medicine | Admitting: Pain Medicine

## 2018-12-17 ENCOUNTER — Other Ambulatory Visit: Payer: Self-pay

## 2018-12-17 DIAGNOSIS — G8929 Other chronic pain: Secondary | ICD-10-CM

## 2018-12-17 DIAGNOSIS — M79604 Pain in right leg: Secondary | ICD-10-CM

## 2018-12-17 DIAGNOSIS — G894 Chronic pain syndrome: Secondary | ICD-10-CM

## 2018-12-17 LAB — NOVEL CORONAVIRUS, NAA: SARS-CoV-2, NAA: DETECTED — AB

## 2018-12-17 NOTE — Telephone Encounter (Signed)
Pt had a positive Covid test that she recieved results of today.  She wants to know if she should have total rest since she is feeling so bad.  She does work from home with [employer].  She will need a note to her employer if she should not try to work from home.  She also wants to know if she should continue to take the antibiotic that [facility] prescribed.  Pt's CB# is [phone]  Con Memos

## 2018-12-17 NOTE — Telephone Encounter (Signed)
Mychart message sent.

## 2018-12-17 NOTE — Telephone Encounter (Signed)
We can give a work note for total rest if she'd like.  Would she like it for the total quarantine?  I recommend stopping the abx as we know that COVID is causing her symptoms.

## 2018-12-18 ENCOUNTER — Encounter: Payer: Self-pay | Admitting: Family Medicine

## 2018-12-18 NOTE — Telephone Encounter (Signed)
Patient advised as below.  

## 2018-12-18 NOTE — Telephone Encounter (Signed)
[email or web address]

## 2018-12-18 NOTE — Telephone Encounter (Signed)
Letter sent to Mychart

## 2018-12-18 NOTE — Telephone Encounter (Signed)
Pt calling back on her "note" that was supposed to be sent to her MyChart from Dr. B.  Please call pt back on the status of the note at [phone].  Thanks, [employer]

## 2018-12-18 NOTE — Telephone Encounter (Signed)
Patient advised. She would like a work note for her to have total rest for the entire time during quarantine. Patient verbally voiced understanding about stopping antibiotic. Patient would like the note sent to [facility].

## 2019-01-01 ENCOUNTER — Encounter: Payer: Self-pay | Admitting: Family Medicine

## 2019-01-01 ENCOUNTER — Other Ambulatory Visit: Payer: Self-pay

## 2019-01-01 ENCOUNTER — Ambulatory Visit (INDEPENDENT_AMBULATORY_CARE_PROVIDER_SITE_OTHER): Payer: BC Managed Care – PPO | Admitting: Family Medicine

## 2019-01-01 VITALS — BP 137/84 | HR 73 | Temp 97.8°F | Wt 148.6 lb

## 2019-01-01 DIAGNOSIS — I10 Essential (primary) hypertension: Secondary | ICD-10-CM

## 2019-01-01 DIAGNOSIS — U071 COVID-19: Secondary | ICD-10-CM | POA: Diagnosis not present

## 2019-01-01 DIAGNOSIS — G8929 Other chronic pain: Secondary | ICD-10-CM

## 2019-01-01 DIAGNOSIS — E785 Hyperlipidemia, unspecified: Secondary | ICD-10-CM | POA: Diagnosis not present

## 2019-01-01 DIAGNOSIS — E1159 Type 2 diabetes mellitus with other circulatory complications: Secondary | ICD-10-CM | POA: Diagnosis not present

## 2019-01-01 DIAGNOSIS — E1169 Type 2 diabetes mellitus with other specified complication: Secondary | ICD-10-CM | POA: Diagnosis not present

## 2019-01-01 DIAGNOSIS — Z1159 Encounter for screening for other viral diseases: Secondary | ICD-10-CM | POA: Diagnosis not present

## 2019-01-01 DIAGNOSIS — I152 Hypertension secondary to endocrine disorders: Secondary | ICD-10-CM

## 2019-01-01 DIAGNOSIS — M5441 Lumbago with sciatica, right side: Secondary | ICD-10-CM

## 2019-01-01 DIAGNOSIS — Z23 Encounter for immunization: Secondary | ICD-10-CM

## 2019-01-01 DIAGNOSIS — M5442 Lumbago with sciatica, left side: Secondary | ICD-10-CM

## 2019-01-01 LAB — POCT UA - MICROALBUMIN: Microalbumin Ur, POC: 50 mg/L

## 2019-01-01 NOTE — Progress Notes (Signed)
Patient: Ashley Rodriguez Female    DOB: 02-26-1951   68 y.o.   MRN: [id number] Visit Date: 01/01/2019  Today's Provider: Lavon Paganini, MD   Chief Complaint  Patient presents with  . Diabetes  . Hypertension  . Hyperlipidemia   Subjective:    I, Porsha McClurkin CMA, am acting as a Education administrator for Lavon Paganini, MD.    HPI  Diabetes Mellitus Type II, Follow-up:   Lab Results  Component Value Date   HGBA1C 6.5 03/13/2018   Last seen for diabetes 3 months ago.  Management since then includes no changes. She reports good compliance with treatment. She is not having side effects.  Current symptoms include none Home blood sugar records: N/A  Episodes of hypoglycemia? no   Current Insulin Regimen: no insulin Most Recent Eye Exam: 03/2018 Weight trend: stable Prior visit with dietician: no Current diet: well balanced Current exercise: none  ------------------------------------------------------------------------   Hypertension, follow-up:  BP Readings from Last 3 Encounters:  01/01/19 137/84  10/09/18 123/61  05/06/18 (!) 149/92    She was last seen for hypertension 3 months ago.  BP at that visit was not checked as it was virtual. Management since that visit includes no change.She reports good compliance with treatment. She is not having side effects.  She is not exercising. She is adherent to low salt diet.   Outside blood pressures are not checked. She is experiencing none.  Patient denies chest pain, chest pressure/discomfort, exertional chest pressure/discomfort, fatigue, irregular heart beat and lower extremity edema.   Cardiovascular risk factors include advanced age (older than 45 for men, 74 for women), diabetes mellitus and hypertension.  Use of agents associated with hypertension: none.   ------------------------------------------------------------------------    Lipid/Cholesterol, Follow-up:   Last seen for this 3 months ago.   Management since that visit includes no change.  Last Lipid Panel:    Component Value Date/Time   CHOL 144 03/13/2018   TRIG 116 03/13/2018   HDL 54 03/13/2018   LDLCALC 67 03/13/2018    She reports good compliance with treatment. She is not having side effects.   Wt Readings from Last 3 Encounters:  01/01/19 148 lb 9.6 oz (67.4 kg)  12/15/18 147 lb (66.7 kg)  10/09/18 150 lb (68 kg)    ------------------------------------------------------------------------ Has been holding Enbrel due to COVID 19 infection x3 wks. She has had 5 ESIs in her back with Dr Dossie Arbour.  Only lasting 6-7 weeks.  Insurance is not going to cover ablation until after MRI and PT.  She missed call with Dr Consuela Mimes.    Allergies  Allergen Reactions  . Codeine Rash  . Sulfa Antibiotics Rash     Current Outpatient Medications:  .  acetaminophen (TYLENOL) 325 MG tablet, Take 325 mg by mouth every 6 (six) hours as needed., Disp: , Rfl:  .  amLODipine (NORVASC) 5 MG tablet, Take 5 mg by mouth daily., Disp: , Rfl:  .  atenolol (TENORMIN) 50 MG tablet, , Disp: , Rfl:  .  atorvastatin (LIPITOR) 10 MG tablet, Take 1 tablet by mouth daily., Disp: , Rfl: 0 .  betamethasone dipropionate 0.05 % lotion, Apply 1 application topically as needed., Disp: , Rfl:  .  calcium carbonate (CALCIUM 600) 600 MG TABS tablet, Take 600 mg by mouth daily with breakfast., Disp: , Rfl:  .  ENBREL SURECLICK 50 MG/ML injection, , Disp: , Rfl: 4 .  esomeprazole (NEXIUM) 40 MG capsule, Take 40 mg by mouth  daily., Disp: , Rfl:  .  fexofenadine (ALLEGRA) 180 MG tablet, Take 180 mg by mouth daily., Disp: , Rfl:  .  fluticasone (FLONASE) 50 MCG/ACT nasal spray, , Disp: , Rfl: 4 .  gabapentin (NEURONTIN) 100 MG capsule, Add 100mg  capsule as needed along with 300mg  capsule, Disp: 90 capsule, Rfl: 1 .  gabapentin (NEURONTIN) 300 MG capsule, Take 1 capsule (300 mg total) by mouth at bedtime., Disp: 90 capsule, Rfl: 3 .  meloxicam (MOBIC) 15 MG  tablet, Take 15 mg by mouth daily. , Disp: , Rfl: 2 .  montelukast (SINGULAIR) 10 MG tablet, Take 10 mg by mouth at bedtime. , Disp: , Rfl: 11 .  Multiple Vitamins-Minerals (MULTIVITAMIN WITH MINERALS) tablet, Take 1 tablet by mouth daily., Disp: , Rfl:  .  predniSONE (DELTASONE) 5 MG tablet, Take 5 mg by mouth daily with breakfast., Disp: , Rfl:  .  valACYclovir (VALTREX) 500 MG tablet, Take 500 mg by mouth as needed., Disp: , Rfl:  .  amoxicillin-clavulanate (AUGMENTIN) 875-125 MG tablet, Take 1 tablet by mouth 2 (two) times daily., Disp: 20 tablet, Rfl: 0  Review of Systems  Constitutional: Negative.   Respiratory: Negative.   Genitourinary: Negative.   Musculoskeletal: Positive for back pain.  Hematological: Negative.     Social History   Tobacco Use  . Smoking status: Never Smoker  . Smokeless tobacco: Never Used  Substance Use Topics  . Alcohol use: Yes    Alcohol/week: 4.0 standard drinks    Types: 4 Standard drinks or equivalent per week      Objective:   BP 137/84 (BP Location: Left Arm, Patient Position: Sitting, Cuff Size: Normal)   Pulse 73   Temp 97.8 F (36.6 C) (Oral)   Wt 148 lb 9.6 oz (67.4 kg)   SpO2 97%   BMI 25.51 kg/m  Vitals:   01/01/19 0824  BP: 137/84  Pulse: 73  Temp: 97.8 F (36.6 C)  TempSrc: Oral  SpO2: 97%  Weight: 148 lb 9.6 oz (67.4 kg)  Body mass index is 25.51 kg/m.   Physical Exam Vitals signs reviewed.  Constitutional:      General: She is not in acute distress.    Appearance: Normal appearance. She is well-developed. She is not diaphoretic.  HENT:     Head: Normocephalic and atraumatic.  Eyes:     General: No scleral icterus.    Conjunctiva/sclera: Conjunctivae normal.  Neck:     Musculoskeletal: Neck supple.     Thyroid: No thyromegaly.  Cardiovascular:     Rate and Rhythm: Normal rate and regular rhythm.     Pulses: Normal pulses.     Heart sounds: Normal heart sounds. No murmur.  Pulmonary:     Effort: Pulmonary  effort is normal. No respiratory distress.     Breath sounds: Normal breath sounds. No wheezing, rhonchi or rales.  Musculoskeletal:     Right lower leg: No edema.     Left lower leg: No edema.  Lymphadenopathy:     Cervical: No cervical adenopathy.  Skin:    General: Skin is warm and dry.     Capillary Refill: Capillary refill takes less than 2 seconds.     Findings: No rash.  Neurological:     Mental Status: She is alert and oriented to person, place, and time. Mental status is at baseline.  Psychiatric:        Mood and Affect: Mood normal.        Behavior: Behavior normal.  No results found for any visits on 01/01/19.     Assessment & Plan   Problem List Items Addressed This Visit      Cardiovascular and Mediastinum   Hypertension associated with diabetes ([place]) - Primary    Well controlled Continue current medications Recheck metabolic panel F/u in 6 months       Relevant Orders   Comprehensive metabolic panel     Endocrine   T2DM (type 2 diabetes mellitus) ([place])    Previously reportedly well controlled On no meds Diet controlled Associated with HTN and HLD Will send ROI for HCM and last labs to previous PCP again as no rcords have been received Foot exam completed today ROI for last eye exam Check A1c and microalbumin      Relevant Orders   Hemoglobin A1c   POCT UA - Microalbumin (Completed)   Hyperlipidemia associated with type 2 diabetes mellitus ([place])    Continue atorvastatin 10mg  daily Repeat FLP and CMP      Relevant Orders   Lipid panel   Comprehensive metabolic panel     Nervous and Auditory   Chronic low back pain (Primary Area of Pain) (Bilateral) (R>L) (Chronic)    Advised to f/u with pain management and that no pain medication will be prescribed by any other MDs other than the pain management clinic       Other Visit Diagnoses    COVID-19 virus detected     - recovering  - ~2.5 weeks out from positive test - encouraged rest  and hydration   Relevant Orders   CBC w/Diff/Platelet   Need for hepatitis C screening test       Relevant Orders   Hepatitis C Antibody   Need for influenza vaccination       Relevant Orders   Flu Vaccine QUAD High Dose(Fluad) (Completed)       Return in about 6 months (around 07/01/2019) for CPE.   The entirety of the information documented in the History of Present Illness, Review of Systems and Physical Exam were personally obtained by me. Portions of this information were initially documented by Tiburcio Pea and [place], CMA and reviewed by me for thoroughness and accuracy.    Charmin Aguiniga, Dionne Bucy, MD MPH [facility]

## 2019-01-01 NOTE — Assessment & Plan Note (Addendum)
Previously reportedly well controlled On no meds Diet controlled Associated with HTN and HLD Will send ROI for HCM and last labs to previous PCP again as no rcords have been received Foot exam completed today ROI for last eye exam Check A1c and microalbumin

## 2019-01-01 NOTE — Assessment & Plan Note (Signed)
Advised to f/u with pain management and that no pain medication will be prescribed by any other MDs other than the pain management clinic

## 2019-01-01 NOTE — Assessment & Plan Note (Signed)
Continue atorvastatin 10mg  daily Repeat FLP and CMP

## 2019-01-01 NOTE — Assessment & Plan Note (Signed)
Well controlled Continue current medications Recheck metabolic panel F/u in 6 months  

## 2019-01-02 ENCOUNTER — Telehealth: Payer: Self-pay | Admitting: Pain Medicine

## 2019-01-02 ENCOUNTER — Telehealth: Payer: Self-pay

## 2019-01-02 LAB — CBC WITH DIFFERENTIAL/PLATELET
Basophils Absolute: 0 10*3/uL (ref 0.0–0.2)
Basos: 1 %
EOS (ABSOLUTE): 0.2 10*3/uL (ref 0.0–0.4)
Eos: 3 %
Hematocrit: 42.2 % (ref 34.0–46.6)
Hemoglobin: 14.5 g/dL (ref 11.1–15.9)
Immature Grans (Abs): 0 10*3/uL (ref 0.0–0.1)
Immature Granulocytes: 0 %
Lymphocytes Absolute: 2 10*3/uL (ref 0.7–3.1)
Lymphs: 37 %
MCH: 32.5 pg (ref 26.6–33.0)
MCHC: 34.4 g/dL (ref 31.5–35.7)
MCV: 95 fL (ref 79–97)
Monocytes Absolute: 0.6 10*3/uL (ref 0.1–0.9)
Monocytes: 11 %
Neutrophils Absolute: 2.5 10*3/uL (ref 1.4–7.0)
Neutrophils: 48 %
Platelets: 415 10*3/uL (ref 150–450)
RBC: 4.46 x10E6/uL (ref 3.77–5.28)
RDW: 13 % (ref 11.7–15.4)
WBC: 5.4 10*3/uL (ref 3.4–10.8)

## 2019-01-02 LAB — COMPREHENSIVE METABOLIC PANEL
ALT: 22 IU/L (ref 0–32)
AST: 22 IU/L (ref 0–40)
Albumin/Globulin Ratio: 2 (ref 1.2–2.2)
Albumin: 4.5 g/dL (ref 3.8–4.8)
Alkaline Phosphatase: 58 IU/L (ref 39–117)
BUN/Creatinine Ratio: 23 (ref 12–28)
BUN: 14 mg/dL (ref 8–27)
Bilirubin Total: 0.5 mg/dL (ref 0.0–1.2)
CO2: 25 mmol/L (ref 20–29)
Calcium: 9.4 mg/dL (ref 8.7–10.3)
Chloride: 103 mmol/L (ref 96–106)
Creatinine, Ser: 0.61 mg/dL (ref 0.57–1.00)
GFR calc Af Amer: 109 mL/min/{1.73_m2} (ref >59)
GFR calc non Af Amer: 95 mL/min/{1.73_m2} (ref >59)
Globulin, Total: 2.2 g/dL (ref 1.5–4.5)
Glucose: 95 mg/dL (ref 65–99)
Potassium: 4.2 mmol/L (ref 3.5–5.2)
Sodium: 143 mmol/L (ref 134–144)
Total Protein: 6.7 g/dL (ref 6.0–8.5)

## 2019-01-02 LAB — HEMOGLOBIN A1C
Est. average glucose Bld gHb Est-mCnc: 137 mg/dL
Hgb A1c MFr Bld: 6.4 % — ABNORMAL HIGH (ref 4.8–5.6)

## 2019-01-02 LAB — LIPID PANEL
Chol/HDL Ratio: 3.2 ratio (ref 0.0–4.4)
Cholesterol, Total: 152 mg/dL (ref 100–199)
HDL: 47 mg/dL (ref >39)
LDL Chol Calc (NIH): 87 mg/dL (ref 0–99)
Triglycerides: 95 mg/dL (ref 0–149)
VLDL Cholesterol Cal: 18 mg/dL (ref 5–40)

## 2019-01-02 LAB — HEPATITIS C ANTIBODY: Hep C Virus Ab: <0.1 s/co ratio (ref 0.0–0.9)

## 2019-01-02 MED ORDER — LISINOPRIL 5 MG PO TABS: 5.0000 mg | ORAL_TABLET | Freq: Every day | ORAL | 1 refills | Status: DC

## 2019-01-02 MED ORDER — ATORVASTATIN CALCIUM 20 MG PO TABS: 20.0000 mg | ORAL_TABLET | Freq: Every day | ORAL | 1 refills | Status: DC

## 2019-01-02 NOTE — Telephone Encounter (Signed)
Patient advised. She agrees with medication change. RX sent to [facility].

## 2019-01-02 NOTE — Telephone Encounter (Signed)
-----   Message from Virginia Crews, MD sent at 01/02/2019  8:17 AM EDT ----- Normal labs, except: A1c is well controlled at 6.4.  Cholesterol is not to goal in the settign of diabetes.  Recommend increasing atorvastatin dose to 20mg  daily (OK to send new Rx for 90 days and 1 refill if patient agrees). Would repeat at next visit.  Microalbumin is high, meaning that the kidneys are lettign through microscopic amounts of protein.  This is an early warning sign in diabetics that we need to protect the kidneys.  Recommend small dose of lisinopril 5mg  daily to protect kidneys (can send 90 day supoply and 1 refill if patient agrees).

## 2019-01-02 NOTE — Telephone Encounter (Signed)
Having a lot of pain in lower back and in bilater legs, seems to be pulling in her legs and causes her to walk bent over. Would like to talk with nurse to see if there is something she can do to help relieve some of this. Please call and talk with patient.

## 2019-01-02 NOTE — Telephone Encounter (Signed)
Attempted to call patient.  Left message asking her to call us back.

## 2019-01-06 NOTE — Telephone Encounter (Signed)
Spoke with patient and she is having an ongoing problem with her back and legs.  Missed her last appt with Dr Dossie Arbour because she was on the phone with her company health at work d/t covid.  I encouraged staying hydrated, stretching, tylenol and then discussing with Dr Dossie Arbour at her appt on 01/14/19.

## 2019-01-12 ENCOUNTER — Encounter: Payer: Self-pay | Admitting: Pain Medicine

## 2019-01-13 NOTE — Progress Notes (Signed)
Pain Management Virtual Encounter Note - Virtual Visit via Telephone Telehealth (real-time audio visits between healthcare provider and patient).   Patient's Phone No. & Preferred Pharmacy:  [phone] (home); [phone] (mobile); (Preferred) [phone] [email or web address]  [place] [place] [address] Phone: [phone] Fax: [phone]    Pre-screening note:  Our staff contacted Ashley Rodriguez and offered her an "in person", "face-to-face" appointment versus a telephone encounter. She indicated preferring the telephone encounter, at this time.   Reason for Virtual Visit: COVID-19*  Social distancing based on CDC and AMA recommendations.   I contacted Ashley Rodriguez on 01/14/2019 via telephone.      I clearly identified myself as Gaspar Cola, MD. I verified that I was speaking with the correct person using two identifiers (Name: Ashley Rodriguez, and date of birth: 1951/04/25).  Advanced Informed Consent I sought verbal advanced consent from Ashley Rodriguez for virtual visit interactions. I informed Ashley Rodriguez of possible security and privacy concerns, risks, and limitations associated with providing "not-in-person" medical evaluation and management services. I also informed Ashley Rodriguez of the availability of "in-person" appointments. Finally, I informed her that there would be a charge for the virtual visit and that she could be  personally, fully or partially, financially responsible for it. Ashley Rodriguez expressed understanding and agreed to proceed.   Historic Elements   Ashley Rodriguez is a 68 y.o. year old, female patient evaluated today after her last encounter by our practice on 01/02/2019. 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 She also   has a past surgical history that includes Abdominal hysterectomy (2000); lipoma removed (04/2012); Colonoscopy (2015); and Neck mass excision (04/28/14). Ashley Rodriguez has a current medication list which includes the following prescription(s): acetaminophen, amlodipine, atenolol, atorvastatin, betamethasone dipropionate, calcium carbonate, enbrel sureclick, esomeprazole, fexofenadine, fluticasone, gabapentin, gabapentin, lisinopril, meloxicam, montelukast, multivitamin with minerals, prednisone, and valacyclovir. She  reports that she has never smoked. She has never used smokeless tobacco. She reports current alcohol use of about 4.0 standard drinks of alcohol per week. She reports that she does not use drugs. Ashley Rodriguez is allergic to codeine and sulfa antibiotics.   HPI  Today, she is being contacted for worsening of previously known (established) problem.  So far this patient has had several diagnostic bilateral lumbar facet blocks, all of which provide her with excellent relief of the pain, unfortunately did not last.  However because she wants to save money, she has been trying to do the minimum possible to get relief of her pain and because she has not had the physical therapy and she has also not had the x-rays of her back, she has not been able to qualify for radiofrequency ablation of the lumbar facets.  Today I spent over 35 minutes with the patient trying to explain to her what needed to be done.  Today I will go ahead and put an order for a physical therapy trial and some x-rays of the lumbar spine to see if we can get this moving along.  Today she asked me if I could try to get an appeal to get radiofrequency approved, but she fails to understand that I need to have these things available to me so that I can really appeal her case and have some hope that it will be approved.  Otherwise, just asking  them to let her have her radiofrequency is not going to cut it.  Today she asked me if there was anything  that I could do regarding her medications and in evaluating those, I have identified that she could benefit from increasing her gabapentin from 300 mg p.o. at bedtime to perhaps some higher doses.  I have explained to the patient that in order for this to be successful, the medication has to be increased very slowly.  I suggested going to 600 mg at bedtime and staying on that dose for 1 to 2 weeks, followed by 900 mg.  Should she tolerate this well, then she could begin to at some other 300 mg pills during the day until she has a q 6 - 8 hours schedule.  I have explained to her that she could technically go up to 3600 mg/day, divided in 4 doses, if she can tolerate it.  In any case, I am not prescribing that medication and therefore unlikely make any changes on those, I simply suggested to the patient to talk to the prescriber to see if that would be something that they would be willing to do for her.  Today she indicates her worst pain to be the leg pain and therefore I have offered her to do a right-sided L4-5 lumbar epidural steroid injection under fluoroscopic guidance, to see if we can help tone this pain down.  In summary, today I put an order for the epidural, a referral to physical therapy, and an order for some x-rays to be done of her lower back.  Pharmacotherapy Assessment  Analgesic: No opioid analgesics from our practice.   Monitoring: Pharmacotherapy: No side-effects or adverse reactions reported. [place] PMP: PDMP reviewed during this encounter.       Compliance: No problems identified. Effectiveness: Clinically acceptable. Plan: Refer to "POC".  UDS: No results found for: SUMMARY Laboratory Chemistry Profile (12 mo)  Renal: 01/01/2019: BUN 14; BUN/Creatinine Ratio 23; Creatinine, Ser 0.61  Lab Results  Component Value Date   GFRAA 109 01/01/2019   GFRNONAA 95 01/01/2019   Hepatic: 01/01/2019: Albumin 4.5 Lab Results  Component Value Date   AST 22 01/01/2019   ALT 22 01/01/2019    Other: No results found for requested labs within last 8760 hours. Note: Above Lab results reviewed.  Imaging  Last 90 days:  No results found.  Assessment  The primary encounter diagnosis was Chronic pain syndrome. Diagnoses of Chronic low back pain (Primary Area of Pain) (Bilateral) (R>L), Chronic lower extremity pain (Secondary Area of Pain) (Bilateral) (R>L), DDD (degenerative disc disease), lumbar, Grade 1 Anterolisthesis of L5 over S1, and L5 pars defect with spondylolisthesis (Bilateral) were also pertinent to this visit.  Plan of Care  I am having Ashley Rodriguez maintain her amLODipine, calcium carbonate, multivitamin with minerals, valACYclovir, meloxicam, montelukast, fluticasone, acetaminophen, fexofenadine, predniSONE, Enbrel SureClick, gabapentin, esomeprazole, betamethasone dipropionate, gabapentin, atenolol, atorvastatin, and lisinopril.  Pharmacotherapy (Medications Ordered): No orders of the defined types were placed in this encounter.  Orders:  Orders Placed This Encounter  Procedures  . Lumbar Epidural Injection    Standing Status:   Future    Standing Expiration Date:   02/13/2019    Scheduling Instructions:     Procedure: Interlaminar Lumbar Epidural Steroid injection (LESI)  L4-5     Laterality: Right-sided     Sedation: None required.     Timeframe: ASAA    Order Specific Question:   Where will this procedure be performed?  Answer:   [facility]  . DG Lumbar Spine Complete    Standing Status:   Future    Standing Expiration Date:   04/15/2019    Order Specific Question:   Reason for Exam (SYMPTOM  OR DIAGNOSIS REQUIRED)    Answer:   Back Pain    Order Specific Question:   Preferred imaging location?    Answer:   [facility]    Order Specific Question:   Call Results- Best Contact Number?    Answer:   [facility] [phone]    Order Specific Question:   Radiology Contrast Protocol - do NOT remove file path    Answer:    [email or web address]  . Ambulatory referral to Physical Therapy    Referral Priority:   Routine    Referral Type:   Physical Medicine    Referral Reason:   Specialty Services Required    Requested Specialty:   Physical Therapy    Number of Visits Requested:   1   Follow-up plan:   Return for Procedure (no sedation): (R) L4-5 LESI #1.      Interventional management options: Planned, scheduled, and/or pending:   None at this time.    Considering:   Possible bilateral lumbar facet RFA Diagnostic bilateral SI joint block Possible bilateral SI joint RFA Diagnostic bilateral hip joint injection Possible bilateral femoral +obturator NB Possible bilateral femoral + obturator nerve RFA Diagnostic right L4-5 LESI   Palliative PRN treatment(s):   Palliativebilateral lumbar facet block#6(diagnostic lumbar facet blocks provide the patient with 90 to 100% relief of the pain, unfortunately they did not last.  The insurance company has denied radiofrequency ablation for some unknown reason.)    Recent Visits Date Type Provider Dept  12/17/18 Office Visit Milinda Pointer, MD [facility]  10/27/18 Office Visit Milinda Pointer, MD [facility]  Showing recent visits within past 90 days and meeting all other requirements   Today's Visits Date Type Provider Dept  01/14/19 Office Visit Milinda Pointer, MD [facility]  Showing today's visits and meeting all other requirements   Future Appointments No visits were found meeting these conditions.  Showing future appointments within next 90 days and meeting all other requirements   I discussed the assessment and treatment plan with the patient. The patient was provided an opportunity to ask questions and all were answered. The patient agreed with the plan and demonstrated an understanding of the instructions.  Patient advised to call back or seek an in-person  evaluation if the symptoms or condition worsens.  Total duration of non-face-to-face encounter: 35 minutes.  Note by: Gaspar Cola, MD Date: 01/14/2019; Time: 3:50 PM  Note: This dictation was prepared with Dragon dictation. Any transcriptional errors that may result from this process are unintentional.  Disclaimer:  * Given the special circumstances of the COVID-19 pandemic, the federal government has announced that the Office for Civil Rights (OCR) will exercise its enforcement discretion and will not impose penalties on physicians using telehealth in the event of noncompliance with regulatory requirements under the [place] (HIPAA) in connection with the good faith provision of telehealth during the XX123456 national public health emergency. ([place])

## 2019-01-14 ENCOUNTER — Ambulatory Visit: Payer: BC Managed Care – PPO | Admitting: Podiatry

## 2019-01-14 ENCOUNTER — Ambulatory Visit: Payer: Medicare Other | Attending: Pain Medicine | Admitting: Pain Medicine

## 2019-01-14 ENCOUNTER — Other Ambulatory Visit: Payer: Self-pay

## 2019-01-14 DIAGNOSIS — G894 Chronic pain syndrome: Secondary | ICD-10-CM

## 2019-01-14 DIAGNOSIS — M79604 Pain in right leg: Secondary | ICD-10-CM

## 2019-01-14 DIAGNOSIS — M5136 Other intervertebral disc degeneration, lumbar region: Secondary | ICD-10-CM

## 2019-01-14 DIAGNOSIS — M4300 Spondylolysis, site unspecified: Secondary | ICD-10-CM

## 2019-01-14 DIAGNOSIS — M79605 Pain in left leg: Secondary | ICD-10-CM

## 2019-01-14 DIAGNOSIS — M5442 Lumbago with sciatica, left side: Secondary | ICD-10-CM

## 2019-01-14 DIAGNOSIS — G8929 Other chronic pain: Secondary | ICD-10-CM

## 2019-01-14 DIAGNOSIS — M5441 Lumbago with sciatica, right side: Secondary | ICD-10-CM

## 2019-01-14 DIAGNOSIS — M4310 Spondylolisthesis, site unspecified: Secondary | ICD-10-CM

## 2019-01-14 DIAGNOSIS — M51369 Other intervertebral disc degeneration, lumbar region without mention of lumbar back pain or lower extremity pain: Secondary | ICD-10-CM

## 2019-01-14 NOTE — Patient Instructions (Signed)
____________________________________________________________________________________________  Preparing for your procedure (without sedation)  Procedure appointments are limited to planned procedures: . No Prescription Refills. . No disability issues will be discussed. . No medication changes will be discussed.  Instructions: . Oral Intake: Do not eat or drink anything for at least 3 hours prior to your procedure. . Transportation: Unless otherwise stated by your physician, you may drive yourself after the procedure. . Blood Pressure Medicine: Take your blood pressure medicine with a sip of water the morning of the procedure. . Blood thinners: Notify our staff if you are taking any blood thinners. Depending on which one you take, there will be specific instructions on how and when to stop it. . Diabetics on insulin: Notify the staff so that you can be scheduled 1st case in the morning. If your diabetes requires high dose insulin, take only  of your normal insulin dose the morning of the procedure and notify the staff that you have done so. . Preventing infections: Shower with an antibacterial soap the morning of your procedure.  . Build-up your immune system: Take 1000 mg of Vitamin C with every meal (3 times a day) the day prior to your procedure. . Antibiotics: Inform the staff if you have a condition or reason that requires you to take antibiotics before dental procedures. . Pregnancy: If you are pregnant, call and cancel the procedure. . Sickness: If you have a cold, fever, or any active infections, call and cancel the procedure. . Arrival: You must be in the facility at least 30 minutes prior to your scheduled procedure. . Children: Do not bring any children with you. . Dress appropriately: Bring dark clothing that you would not mind if they get stained. . Valuables: Do not bring any jewelry or valuables.  Reasons to call and reschedule or cancel your procedure: (Following these  recommendations will minimize the risk of a serious complication.) . Surgeries: Avoid having procedures within 2 weeks of any surgery. (Avoid for 2 weeks before or after any surgery). . Flu Shots: Avoid having procedures within 2 weeks of a flu shots or . (Avoid for 2 weeks before or after immunizations). . Barium: Avoid having a procedure within 7-10 days after having had a radiological study involving the use of radiological contrast. (Myelograms, Barium swallow or enema study). . Heart attacks: Avoid any elective procedures or surgeries for the initial 6 months after a "Myocardial Infarction" (Heart Attack). . Blood thinners: It is imperative that you stop these medications before procedures. Let us know if you if you take any blood thinner.  . Infection: Avoid procedures during or within two weeks of an infection (including chest colds or gastrointestinal problems). Symptoms associated with infections include: Localized redness, fever, chills, night sweats or profuse sweating, burning sensation when voiding, cough, congestion, stuffiness, runny nose, sore throat, diarrhea, nausea, vomiting, cold or Flu symptoms, recent or current infections. It is specially important if the infection is over the area that we intend to treat. . Heart and lung problems: Symptoms that may suggest an active cardiopulmonary problem include: cough, chest pain, breathing difficulties or shortness of breath, dizziness, ankle swelling, uncontrolled high or unusually low blood pressure, and/or palpitations. If you are experiencing any of these symptoms, cancel your procedure and contact your primary care physician for an evaluation.  Remember:  Regular Business hours are:  Monday to Thursday 8:00 AM to 4:00 PM  Provider's Schedule: Sherrelle Prochazka, MD:  Procedure days: Tuesday and Thursday 7:30 AM to 4:00 PM  [place]   Lateef, MD:  Procedure days: Monday and Wednesday 7:30 AM to 4:00  PM ____________________________________________________________________________________________    

## 2019-01-19 ENCOUNTER — Telehealth: Payer: Self-pay | Admitting: *Deleted

## 2019-01-19 NOTE — Telephone Encounter (Signed)
Ashley Rodriguez,             Please check this patient's orders from her visit on 09-16. Dr. Dossie Arbour had put in orders for PT, x-rays and an LESI. Patient needs to get scheduled ASAP for pain relief. Thank you- Anderson Malta

## 2019-01-19 NOTE — Telephone Encounter (Signed)
Pt states she would like to discuss with Dr. Milinda Pointer the increasing of the Gabapentin and she called last week.

## 2019-01-20 MED ORDER — GABAPENTIN 300 MG PO CAPS: 300.0000 mg | ORAL_CAPSULE | Freq: Three times a day (TID) | ORAL | 3 refills | Status: DC

## 2019-01-20 NOTE — Telephone Encounter (Signed)
I called pt and she states she is using the gabapentin not just for the pain in her feet, but in her back, sciatica, and her pain management doctor directed her to the prescriber Dr. Milinda Pointer for the gabapentin management. Pt states she takes Gabapentin 300mg  after supper and then regularly takes the Gabapentin 100mg  at bedtime, and feels she would benefit from taking the gabapentin through the day, because she is taking tylenol and gets no relief and continues to have pain through out the day.

## 2019-01-20 NOTE — Telephone Encounter (Signed)
I informed pt of Dr. Stephenie Acres change of orders for the Gabapentin to Gabapentin 300mg  one tablet 3 times a day. Pt states understanding.

## 2019-01-20 NOTE — Telephone Encounter (Signed)
She can discuss it with you.  How much is she taking?  What is the most that she has taken that seemed to work?  Does she feel that she needs to take it more often.  When is she having the pain? Or she can send it to me in my chart.

## 2019-01-20 NOTE — Telephone Encounter (Signed)
Sounds good to me.  Lets do 300mg  tid 90day supply and 3 refills.

## 2019-01-22 ENCOUNTER — Ambulatory Visit
Admission: RE | Admit: 2019-01-22 | Discharge: 2019-01-22 | Disposition: A | Discharge status: Home / Self Care | Payer: BC Managed Care – PPO | Source: Ambulatory Visit | Attending: Pain Medicine | Admitting: Pain Medicine

## 2019-01-22 ENCOUNTER — Other Ambulatory Visit: Payer: Self-pay

## 2019-01-22 ENCOUNTER — Ambulatory Visit (HOSPITAL_BASED_OUTPATIENT_CLINIC_OR_DEPARTMENT_OTHER): Payer: BC Managed Care – PPO | Admitting: Pain Medicine

## 2019-01-22 ENCOUNTER — Encounter: Payer: Self-pay | Admitting: Pain Medicine

## 2019-01-22 VITALS — BP 181/99 | HR 61 | Temp 98.3°F | Resp 16 | Ht 61.0 in | Wt 148.0 lb

## 2019-01-22 DIAGNOSIS — M4310 Spondylolisthesis, site unspecified: Secondary | ICD-10-CM | POA: Diagnosis not present

## 2019-01-22 DIAGNOSIS — M5442 Lumbago with sciatica, left side: Secondary | ICD-10-CM | POA: Insufficient documentation

## 2019-01-22 DIAGNOSIS — G8929 Other chronic pain: Secondary | ICD-10-CM | POA: Insufficient documentation

## 2019-01-22 DIAGNOSIS — M5136 Other intervertebral disc degeneration, lumbar region: Secondary | ICD-10-CM

## 2019-01-22 DIAGNOSIS — M79604 Pain in right leg: Secondary | ICD-10-CM | POA: Insufficient documentation

## 2019-01-22 DIAGNOSIS — M4300 Spondylolysis, site unspecified: Secondary | ICD-10-CM

## 2019-01-22 DIAGNOSIS — M5441 Lumbago with sciatica, right side: Secondary | ICD-10-CM | POA: Diagnosis not present

## 2019-01-22 DIAGNOSIS — M79605 Pain in left leg: Secondary | ICD-10-CM

## 2019-01-22 DIAGNOSIS — M4316 Spondylolisthesis, lumbar region: Secondary | ICD-10-CM | POA: Diagnosis not present

## 2019-01-22 MED ORDER — IOHEXOL 180 MG/ML  SOLN
10.0000 mL | Freq: Once | INTRAMUSCULAR | Status: AC
  Administered 2019-01-22: 15:00:00 10 mL via EPIDURAL

## 2019-01-22 MED ORDER — SODIUM CHLORIDE (PF) 0.9 % IJ SOLN
INTRAMUSCULAR | Status: AC
  Filled 2019-01-22: qty 10

## 2019-01-22 MED ORDER — ROPIVACAINE HCL 2 MG/ML IJ SOLN
2.0000 mL | Freq: Once | INTRAMUSCULAR | Status: AC
  Administered 2019-01-22: 2 mL via EPIDURAL
  Filled 2019-01-22: qty 10

## 2019-01-22 MED ORDER — SODIUM CHLORIDE 0.9% FLUSH
2.0000 mL | Freq: Once | INTRAVENOUS | Status: AC
  Administered 2019-01-22: 2 mL

## 2019-01-22 MED ORDER — TRIAMCINOLONE ACETONIDE 40 MG/ML IJ SUSP
40.0000 mg | Freq: Once | INTRAMUSCULAR | Status: AC
  Administered 2019-01-22: 40 mg
  Filled 2019-01-22: qty 1

## 2019-01-22 MED ORDER — LIDOCAINE HCL 2 % IJ SOLN
20.0000 mL | Freq: Once | INTRAMUSCULAR | Status: AC
  Administered 2019-01-22: 400 mg
  Filled 2019-01-22: qty 20

## 2019-01-22 NOTE — Patient Instructions (Signed)

## 2019-01-22 NOTE — Progress Notes (Signed)
Patient's Name: Ashley Rodriguez  MRN: [id number]  Referring Provider: Milinda Pointer, MD  DOB: May 08, 1950  PCP: Virginia Crews, MD  DOS: 01/22/2019  Note by: Gaspar Cola, MD  Service setting: Ambulatory outpatient  Specialty: Interventional Pain Management  Patient type: Established  Location: [facility] (AMB) Pain Management Facility  Visit type: Interventional Procedure   Primary Reason for Visit: Interventional Pain Management Treatment. CC: Back Pain (low)  Procedure:          Anesthesia, Analgesia, Anxiolysis:  Type: Therapeutic Inter-Laminar Epidural Steroid Injection  #1  Region: Lumbar Level: L4-5 Level. Laterality: Right-Sided Paramedial  Type: Local Anesthesia Indication(s): Analgesia         Route: Infiltration ([place]/IM) IV Access: Declined Sedation: Declined  Local Anesthetic: Lidocaine 1-2%  Position: Prone with head of the table was raised to facilitate breathing.   Indications: 1. DDD (degenerative disc disease), lumbar   2. Grade 1 Anterolisthesis of L5 over S1   3. L5 pars defect with spondylolisthesis (Bilateral)   4. Chronic lower extremity pain (Secondary Area of Pain) (Bilateral) (R>L)   5. Chronic low back pain (Primary Area of Pain) (Bilateral) (R>L)    Pain Score: Pre-procedure: 10-Worst pain ever/10 Post-procedure: (WHEN SITTING UP PATIENT FELT DIXZZY AND BLURRY MD AWARE AND )/10   Pre-op Assessment:  Ashley Rodriguez is a 68 y.o. (year old), female patient, seen today for interventional treatment. She  has a past surgical history that includes Abdominal hysterectomy (2000); lipoma removed (04/2012); Colonoscopy (2015); and Neck mass excision (04/28/14). Ashley Rodriguez has a current medication list which includes the following prescription(s): acetaminophen, amlodipine, atenolol, atorvastatin, betamethasone dipropionate, calcium carbonate, enbrel sureclick, esomeprazole, fexofenadine, fluticasone, gabapentin, lisinopril, meloxicam, montelukast, multivitamin with  minerals, prednisone, and valacyclovir. Her primarily concern today is the Back Pain (low)  Initial Vital Signs:  Pulse/HCG Rate: 62  Temp: 98.3 F (36.8 C) Resp: 18 BP: (!) 163/93 SpO2: 99 %  BMI: Estimated body mass index is 27.96 kg/m as calculated from the following:   Height as of this encounter: 5\' 1"  (1.549 m).   Weight as of this encounter: 148 lb (67.1 kg).  Risk Assessment: Allergies: Reviewed. She is allergic to codeine and sulfa antibiotics.  Allergy Precautions: None required Coagulopathies: Reviewed. None identified.  Blood-thinner therapy: None at this time Active Infection(s): Reviewed. None identified. Ashley Rodriguez is afebrile  Site Confirmation: Ashley Rodriguez was asked to confirm the procedure and laterality before marking the site Procedure checklist: Completed Consent: Before the procedure and under the influence of no sedative(s), amnesic(s), or anxiolytics, the patient was informed of the treatment options, risks and possible complications. To fulfill our ethical and legal obligations, as recommended by the American Medical Association's Code of Ethics, I have informed the patient of my clinical impression; the nature and purpose of the treatment or procedure; the risks, benefits, and possible complications of the intervention; the alternatives, including doing nothing; the risk(s) and benefit(s) of the alternative treatment(s) or procedure(s); and the risk(s) and benefit(s) of doing nothing. The patient was provided information about the general risks and possible complications associated with the procedure. These may include, but are not limited to: failure to achieve desired goals, infection, bleeding, organ or nerve damage, allergic reactions, paralysis, and death. In addition, the patient was informed of those risks and complications associated to Spine-related procedures, such as failure to decrease pain; infection (i.e.: Meningitis, epidural or intraspinal abscess);  bleeding (i.e.: epidural hematoma, subarachnoid hemorrhage, or any other type of intraspinal or peri-dural bleeding);  organ or nerve damage (i.e.: Any type of peripheral nerve, nerve root, or spinal cord injury) with subsequent damage to sensory, motor, and/or autonomic systems, resulting in permanent pain, numbness, and/or weakness of one or several areas of the body; allergic reactions; (i.e.: anaphylactic reaction); and/or death. Furthermore, the patient was informed of those risks and complications associated with the medications. These include, but are not limited to: allergic reactions (i.e.: anaphylactic or anaphylactoid reaction(s)); adrenal axis suppression; blood sugar elevation that in diabetics may result in ketoacidosis or comma; water retention that in patients with history of congestive heart failure may result in shortness of breath, pulmonary edema, and decompensation with resultant heart failure; weight gain; swelling or edema; medication-induced neural toxicity; particulate matter embolism and blood vessel occlusion with resultant organ, and/or nervous system infarction; and/or aseptic necrosis of one or more joints. Finally, the patient was informed that Medicine is not an exact science; therefore, there is also the possibility of unforeseen or unpredictable risks and/or possible complications that may result in a catastrophic outcome. The patient indicated having understood very clearly. We have given the patient no guarantees and we have made no promises. Enough time was given to the patient to ask questions, all of which were answered to the patient's satisfaction. Ashley Rodriguez has indicated that she wanted to continue with the procedure. Attestation: I, the ordering provider, attest that I have discussed with the patient the benefits, risks, side-effects, alternatives, likelihood of achieving goals, and potential problems during recovery for the procedure that I have provided informed  consent. Date  Time: 01/22/2019  1:39 PM  Pre-Procedure Preparation:  Monitoring: As per clinic protocol. Respiration, ETCO2, SpO2, BP, heart rate and rhythm monitor placed and checked for adequate function Safety Precautions: Patient was assessed for positional comfort and pressure points before starting the procedure. Time-out: I initiated and conducted the "Time-out" before starting the procedure, as per protocol. The patient was asked to participate by confirming the accuracy of the "Time Out" information. Verification of the correct person, site, and procedure were performed and confirmed by me, the nursing staff, and the patient. "Time-out" conducted as per Joint Commission's Universal Protocol (UP.01.01.01). Time: 1446  Description of Procedure:          Target Area: The interlaminar space, initially targeting the lower laminar border of the superior vertebral body. Approach: Paramedial approach. Area Prepped: Entire Posterior Lumbar Region Prepping solution: DuraPrep (Iodine Povacrylex [0.7% available iodine] and Isopropyl Alcohol, 74% w/w) Safety Precautions: Aspiration looking for blood return was conducted prior to all injections. At no point did we inject any substances, as a needle was being advanced. No attempts were made at seeking any paresthesias. Safe injection practices and needle disposal techniques used. Medications properly checked for expiration dates. SDV (single dose vial) medications used. Description of the Procedure: Protocol guidelines were followed. The procedure needle was introduced through the skin, ipsilateral to the reported pain, and advanced to the target area. Bone was contacted and the needle walked caudad, until the lamina was cleared. The epidural space was identified using "loss-of-resistance technique" with 2-3 ml of PF-NaCl (0.9% NSS), in a 5cc LOR glass syringe.  Vitals:   01/22/19 1445 01/22/19 1455 01/22/19 1504 01/22/19 1508  BP: (!) 163/93 (!)  164/78 (!) 170/80 (!) 181/99  Pulse: 60 (!) 57 61   Resp: 15 15 16    Temp:      SpO2: 100% 99% 99%   Weight:      Height:  Start Time: 1446 hrs. End Time:   hrs.  Materials:  Needle(s) Type: Epidural needle Gauge: 17G Length: 3.5-in Medication(s): Please see orders for medications and dosing details.  Imaging Guidance (Spinal):          Type of Imaging Technique: Fluoroscopy Guidance (Spinal) Indication(s): Assistance in needle guidance and placement for procedures requiring needle placement in or near specific anatomical locations not easily accessible without such assistance. Exposure Time: Please see nurses notes. Contrast: Before injecting any contrast, we confirmed that the patient did not have an allergy to iodine, shellfish, or radiological contrast. Once satisfactory needle placement was completed at the desired level, radiological contrast was injected. Contrast injected under live fluoroscopy. No contrast complications. See chart for type and volume of contrast used. Fluoroscopic Guidance: I was personally present during the use of fluoroscopy. "Tunnel Vision Technique" used to obtain the best possible view of the target area. Parallax error corrected before commencing the procedure. "Direction-depth-direction" technique used to introduce the needle under continuous pulsed fluoroscopy. Once target was reached, antero-posterior, oblique, and lateral fluoroscopic projection used confirm needle placement in all planes. Images permanently stored in EMR. Interpretation: I personally interpreted the imaging intraoperatively. Adequate needle placement confirmed in multiple planes. Appropriate spread of contrast into desired area was observed. No evidence of afferent or efferent intravascular uptake. No intrathecal or subarachnoid spread observed. Permanent images saved into the patient's record.  Antibiotic Prophylaxis:   Anti-infectives (From admission, onward)   None      Indication(s): None identified  Post-operative Assessment:  Post-procedure Vital Signs:  Pulse/HCG Rate: 61  Temp: 98.3 F (36.8 C) Resp: 16 BP: (!) 181/99(GAVE SPRITE TO DRINK) SpO2: 99 %  EBL: None  Complications: No immediate post-treatment complications observed by team, or reported by patient.  Note: The patient tolerated the entire procedure well. A repeat set of vitals were taken after the procedure and the patient was kept under observation following institutional policy, for this type of procedure. Post-procedural neurological assessment was performed, showing return to baseline, prior to discharge. The patient was provided with post-procedure discharge instructions, including a section on how to identify potential problems. Should any problems arise concerning this procedure, the patient was given instructions to immediately contact us, at any time, without hesitation. In any case, we plan to contact the patient by telephone for a follow-up status report regarding this interventional procedure.  Comments:  No additional relevant information.  Plan of Care  Orders:  Orders Placed This Encounter  Procedures  . Lumbar Epidural Injection    Scheduling Instructions:     Procedure: Interlaminar LESI L4-5     Laterality: Right-sided     Sedation: No Sedation     Timeframe:  Today    Order Specific Question:   Where will this procedure be performed?    Answer:   [facility]  . DG PAIN CLINIC C-ARM 1-60 MIN NO REPORT    Intraoperative interpretation by procedural physician at [facility].    Standing Status:   Standing    Number of Occurrences:   1    Order Specific Question:   Reason for exam:    Answer:   Assistance in needle guidance and placement for procedures requiring needle placement in or near specific anatomical locations not easily accessible without such assistance.  . DG Lumbar Spine Complete W/Bend    In addition to any acute findings,  please report on degenerative changes related to: (Please specify level(s)) (1) ROM & instability (>54mm  displacement) (2) Facet joint (Zygoapophyseal Joint) (3) DDD and/or IVDD (4) Pars defects (5) Previous surgical changes (Include description of hardware and hardware status, if present) (6) Presence and degree of spondylolisthesis, spondylosis, and/or spondyloarthropathies)  (7) Old Fractures (8) Demineralization (9) Additional bone pathology (10) Stenosis (Central, Lateral Recess, Foraminal) (11) If at all possible, please provide AP diameter (mm) of foraminal and/or central canal.    Standing Status:   Future    Number of Occurrences:   1    Standing Expiration Date:   04/23/2019    Order Specific Question:   Reason for Exam (SYMPTOM  OR DIAGNOSIS REQUIRED)    Answer:   Low back pain    Order Specific Question:   Preferred imaging location?    Answer:   [facility]    Order Specific Question:   Call Results- Best Contact Number?    Answer:   [phone] ([facility])    Order Specific Question:   Radiology Contrast Protocol - do NOT remove file path    Answer:   [email or web address]  . Informed Consent Details: Physician/Practitioner Attestation; Transcribe to consent form and obtain patient signature    Provider Attestation: I, [place] Dossie Arbour, MD, (Pain Management Specialist), the physician/practitioner, attest that I have discussed with the patient the benefits, risks, side effects, alternatives, likelihood of achieving goals and potential problems during recovery for the procedure that I have provided informed consent.    Scheduling Instructions:     Procedure: Lumbar epidural steroid injection under fluoroscopic guidance     Indications: Low back and/or lower extremity pain secondary to lumbar radiculitis   Chronic Opioid Analgesic:  No opioid analgesics from our practice.   Medications ordered for procedure: Meds  ordered this encounter  Medications  . iohexol (OMNIPAQUE) 180 MG/ML injection 10 mL    Must be Myelogram-compatible. If not available, you may substitute with a water-soluble, non-ionic, hypoallergenic, myelogram-compatible radiological contrast medium.  Marland Kitchen lidocaine (XYLOCAINE) 2 % (with pres) injection 400 mg  . sodium chloride flush (NS) 0.9 % injection 2 mL  . ropivacaine (PF) 2 mg/mL (0.2%) (NAROPIN) injection 2 mL  . triamcinolone acetonide (KENALOG-40) injection 40 mg   Medications administered: We administered iohexol, lidocaine, sodium chloride flush, ropivacaine (PF) 2 mg/mL (0.2%), and triamcinolone acetonide.  See the medical record for exact dosing, route, and time of administration.  Follow-up plan:   Return in about 2 weeks (around 02/05/2019) for (VV), (PP).       Interventional management options: Planned, scheduled, and/or pending:   None at this time.    Considering:   Possible bilateral lumbar facet RFA Diagnostic bilateral SI joint block Possible bilateral SI joint RFA Diagnostic bilateral hip joint injection Possible bilateral femoral +obturator NB Possible bilateral femoral + obturator nerve RFA Diagnostic right L4-5 LESI #2   Palliative PRN treatment(s):   Palliativebilateral lumbar facet block#6(diagnostic lumbar facet blocks provide the patient with 90 to 100% relief of the pain, unfortunately they did not last.  The insurance company has denied radiofrequency ablation for some unknown reason.)     Recent Visits Date Type Provider Dept  01/14/19 Office Visit Milinda Pointer, MD [facility]  12/17/18 Office Visit Milinda Pointer, MD [facility]  10/27/18 Office Visit Milinda Pointer, MD [facility]  Showing recent visits within past 90 days and meeting all other requirements   Today's Visits Date Type Provider Dept  01/22/19 Procedure visit Milinda Pointer, MD [facility]  Showing today's  visits  and meeting all other requirements   Future Appointments Date Type Provider Dept  02/16/19 Appointment Milinda Pointer, MD [facility]  Showing future appointments within next 90 days and meeting all other requirements   Disposition: Discharge home  Discharge Date & Time: 01/22/2019; 1515 hrs.   Primary Care Physician: Virginia Crews, MD Location: [facility] Note by: Gaspar Cola, MD Date: 01/22/2019; Time: 5:03 PM  Disclaimer:  Medicine is not an Chief Strategy Officer. The only guarantee in medicine is that nothing is guaranteed. It is important to note that the decision to proceed with this intervention was based on the information collected from the patient. The Data and conclusions were drawn from the patient's questionnaire, the interview, and the physical examination. Because the information was provided in large part by the patient, it cannot be guaranteed that it has not been purposely or unconsciously manipulated. Every effort has been made to obtain as much relevant data as possible for this evaluation. It is important to note that the conclusions that lead to this procedure are derived in large part from the available data. Always take into account that the treatment will also be dependent on availability of resources and existing treatment guidelines, considered by other Pain Management Practitioners as being common knowledge and practice, at the time of the intervention. For Medico-Legal purposes, it is also important to point out that variation in procedural techniques and pharmacological choices are the acceptable norm. The indications, contraindications, technique, and results of the above procedure should only be interpreted and judged by a Board-Certified Interventional Pain Specialist with extensive familiarity and expertise in the same exact procedure and technique.

## 2019-01-22 NOTE — Progress Notes (Signed)
Safety precautions to be maintained throughout the outpatient stay will include: orient to surroundings, keep bed in low position, maintain call bell within reach at all times, provide assistance with transfer out of bed and ambulation.  

## 2019-01-23 ENCOUNTER — Telehealth: Payer: Self-pay

## 2019-01-23 NOTE — Telephone Encounter (Signed)
Post procedure phone call.  Patinet states she is doing very well.

## 2019-01-26 ENCOUNTER — Other Ambulatory Visit: Payer: Self-pay

## 2019-01-26 ENCOUNTER — Encounter: Payer: Self-pay | Admitting: Physical Therapy

## 2019-01-26 ENCOUNTER — Ambulatory Visit: Payer: BC Managed Care – PPO | Attending: Pain Medicine | Admitting: Physical Therapy

## 2019-01-26 DIAGNOSIS — M5441 Lumbago with sciatica, right side: Secondary | ICD-10-CM | POA: Insufficient documentation

## 2019-01-26 DIAGNOSIS — G8929 Other chronic pain: Secondary | ICD-10-CM | POA: Insufficient documentation

## 2019-01-26 DIAGNOSIS — M5442 Lumbago with sciatica, left side: Secondary | ICD-10-CM | POA: Insufficient documentation

## 2019-01-26 NOTE — Therapy (Signed)
[facility] [address] [place], [address] Phone: [phone]   Fax:  [phone]  Patient Details  Name: Ashley Rodriguez MRN: [id number] Date of Birth: 20-Jul-1950 Referring Provider:  Milinda Pointer, MD  Encounter Date: 01/26/2019  Patient presented to the session on time and agreed to begin the session with one student physical therapist and one licensed physical therapist present and supervising. Patient then expressed her concerns with her financial burdens as well as her belief that physical therapy will not help to correct her spine curvature and physical therapy may even cause harm to her current condition, including but not limited to increase of her back pain. She expressed that she felt the insurance company was witholding coverage for radiofrequency ablation therapy (that would help her) until she shows that physical therapy is unable to help her. Patient requested the physical therapist produce documentation for the insurance company to address her concerns by stating physical therapy will not be effective for her current condition. Therapist kindly stated that she would be unable to provide this because given the available information and current situation it is impossible to determine if patient is able to respond to physical therapy without a physical therapy evaluation and course of subsequent care recommended by physical therapist based on the therapist's professional opinion and clinical assessment. Physical therapist explained the rationale and scope of physical therapy practice and the therapist's responsibility to make recommendations based on therapist's findings and the patient's response over time. Patient then became very emotional (crying) and reports she has severe financial burdens, questions related to fees for her visits as well as extensive caring duty for family members. To best help patient,  physical therapist consulted front office staff to review the information about insurance coverage of physical therapy that the PT clinic obtained from the insurance company and answer questions patient had about billing if she chooses to continue with physical therapy. Physical therapist provided emotional support and sufficient Q&A time throughout the session to answer patient's questions and allow her to express her frustrations and worries. Physical therapist and patient agreed to postpone the physical therapy evaluation until the patient had further time to think about her options and calm down from feeling upset after learning the PT could not provide the documentation she wanted. Patient was educated gently on the role of physical therapy in addressing her condition and concerns and how the evaluation and treatment would be conducted should she decide to proceed with physical therapy. Patient states that she will contact her doctor to decide her further treatment plans and let the PT clinic know if she wishes to proceed with physical therapy at a future date.   Sherrilyn Rist, SPT 01/26/19, 4:59 PM    Everlean Alstrom. Graylon Good, PT, DPT 01/26/19, 7:29 PM  [facility] [address] [place], [address] Phone: [phone]   Fax:  [phone]

## 2019-01-28 ENCOUNTER — Ambulatory Visit: Payer: BC Managed Care – PPO | Admitting: Physical Therapy

## 2019-02-03 ENCOUNTER — Encounter: Payer: BC Managed Care – PPO | Admitting: Physical Therapy

## 2019-02-05 ENCOUNTER — Encounter: Payer: BC Managed Care – PPO | Admitting: Physical Therapy

## 2019-02-10 ENCOUNTER — Encounter: Payer: BC Managed Care – PPO | Admitting: Physical Therapy

## 2019-02-12 ENCOUNTER — Encounter: Payer: BC Managed Care – PPO | Admitting: Physical Therapy

## 2019-02-15 NOTE — Progress Notes (Signed)
Pain Management Virtual Encounter Note - Virtual Visit via Telephone Telehealth (real-time audio visits between healthcare provider and patient).   Patient's Phone No. & Preferred Pharmacy:  [phone] (home); [phone] (mobile); (Preferred) [phone] [email or web address]  [place], [place] [address] Phone: [phone] Fax: [phone]    Pre-screening note:  Our staff contacted Ms. Lehrman and offered her an "in person", "face-to-face" appointment versus a telephone encounter. She indicated preferring the telephone encounter, at this time.   Reason for Virtual Visit: COVID-19*  Social distancing based on CDC and AMA recommendations.   I contacted [name] on 02/16/2019 via telephone.      I clearly identified myself as Gaspar Cola, MD. I verified that I was speaking with the correct person using two identifiers (Name: Mozella Fleishman, and date of birth: 1950-10-25).  Advanced Informed Consent I sought verbal advanced consent from [name] for virtual visit interactions. I informed Ms. Kulesza of possible security and privacy concerns, risks, and limitations associated with providing "not-in-person" medical evaluation and management services. I also informed Ms. Thornes of the availability of "in-person" appointments. Finally, I informed her that there would be a charge for the virtual visit and that she could be  personally, fully or partially, financially responsible for it. Ms. Pancake expressed understanding and agreed to proceed.   Historic Elements   Ms. Hillari Ishida is a 68 y.o. year old, female patient evaluated today after her last encounter by our practice on 01/23/2019. Ms. Spakes  has a past medical history of COVID-19 (12/15/2018), Flank lipoma (08/17/2016), Hypertension, Neck mass (04/07/2013), Psoriatic arthritis ([place]), Recurrent cellulitis, and T2DM (type 2 diabetes mellitus)  ([place]) (10/01/2018). She also  has a past surgical history that includes Abdominal hysterectomy (2000); lipoma removed (04/2012); Colonoscopy (2015); and Neck mass excision (04/28/14). Ms. Steketee has a current medication list which includes the following prescription(s): acetaminophen, amlodipine, atenolol, atorvastatin, betamethasone dipropionate, calcium carbonate, enbrel sureclick, esomeprazole, fexofenadine, fluticasone, gabapentin, lisinopril, meloxicam, montelukast, multivitamin with minerals, prednisone, and valacyclovir. She  reports that she has never smoked. She has never used smokeless tobacco. She reports current alcohol use of about 4.0 standard drinks of alcohol per week. She reports that she does not use drugs. Ms. Pontoriero is allergic to codeine and sulfa antibiotics.   HPI  Today, she is being contacted for a post-procedure assessment.  We have done 5 diagnostic bilateral lumbar facet blocks that have provided this patient with 90 to 100% relief of the pain, unfortunately they have not lasted.  On this last visit we attempted a right-sided L4-5 LESI under fluoroscopic guidance, no sedation.  X-rays of the lumbar spine have demonstrated that the patient has Scoliosis and multilevel degenerative changes most prominent on the left at L1-2, L2-3, L3-4, with a Grade 1 anterolisthesis L5-S1, due to bilateral pars defects of L5, and no abnormal movement on flexion or extension.   In reviewing the results of today's postprocedure evaluation, it seems to be clear that lumbar facet blocks do tend to provide the patient with better relief of the pain then when she had pain with this right-sided L4-5 LESI.  Unfortunately, the patient went to see the physical therapist and she still convinced that she is not going to benefit from the physical therapy and because of this she is reluctant to even try it.  Unfortunately, this only gives ammunition to her insurance company to continue denying the radiofrequency  ablation.  I have tried to explain  this to the patient many times, but she still having problems grasping the concept.   She is interested in perhaps going to see a neurosurgeon and I have explained to her that before she can have that done it would probably be prudent to have the recommended nerve conduction test and MRI that I have ordered today.  I do understand that she is sick and tired of continue to have this pain, but I am afraid that she is not understanding that there are certain steps that she will have to take before going from point A to point B.  Despite the above, [facility] has denied the radiofrequency ablation.  Post-Procedure Evaluation  Procedure: Diagnostic right-sided L4-5 LESI #1 under fluoroscopic guidance, no sedation Pre-procedure pain level:  10/10 Post-procedure: 10/10 No initial benefit, possibly due to rapid discharge after no sedation procedure, without enough time to allow full onset of block.  Sedation: None.  Effectiveness during initial hour after procedure(Ultra-Short Term Relief): 50 % (pain in legs)   Local anesthetic used: Long-acting (4-6 hours) Effectiveness: Defined as any analgesic benefit obtained secondary to the administration of local anesthetics. This carries significant diagnostic value as to the etiological location, or anatomical origin, of the pain. Duration of benefit is expected to coincide with the duration of the local anesthetic used.  Effectiveness during initial 4-6 hours after procedure(Short-Term Relief): 50 %   Long-term benefit: Defined as any relief past the pharmacologic duration of the local anesthetics.  Effectiveness past the initial 6 hours after procedure(Long-Term Relief): 90 % (lasting 2 weeks)   Current benefits: Defined as benefit that persist at this time.   Analgesia:  Somewhat improved.  However, she indicates that the lumbar facet blocks do seem to work better. Function: Somewhat improved.  Check and  confirm that with regards to function of the lumbar facet blocks did better than the LESI. ROM: Somewhat improved.  Just like in the case of analgesia and function, range of motion also seem to have improved more with facet block.  Pharmacotherapy Assessment  Analgesic: No opioid analgesics from our practice.   Monitoring: Pharmacotherapy: No side-effects or adverse reactions reported. [place] PMP: PDMP reviewed during this encounter.       Compliance: No problems identified. Effectiveness: Clinically acceptable. Plan: Refer to "POC".  UDS: No results found for: SUMMARY Laboratory Chemistry Profile (12 mo)  Renal: 01/01/2019: BUN 14; BUN/Creatinine Ratio 23; Creatinine, Ser 0.61  Lab Results  Component Value Date   GFRAA 109 01/01/2019   GFRNONAA 95 01/01/2019   Hepatic: 01/01/2019: Albumin 4.5 Lab Results  Component Value Date   AST 22 01/01/2019   ALT 22 01/01/2019   Other: No results found for requested labs within last 8760 hours. Note: Above Lab results reviewed.  Imaging  Last 90 days:  Dg Lumbar Spine Complete W/bend  Result Date: 01/23/2019 CLINICAL DATA:  Low back pain EXAM: LUMBAR SPINE - COMPLETE WITH BENDING VIEWS COMPARISON:  01/03/2017 FINDINGS: Moderate dextroscoliosis.  Negative for fracture Multilevel degenerative change with disc space narrowing and spurring throughout the lumbar spine. This most prominent on the left at L1-2, L2-3, L3-4. Grade 1 anterolisthesis L5-S1 unchanged from prior study. Bilateral pars defects of L5. No abnormal movement on flexion or extension. IMPRESSION: Scoliosis and multilevel degenerative change Grade 1 anterolisthesis L5-S1 due to bilateral pars defects of L5. No abnormal movement on flexion or extension. Electronically Signed   By: Franchot Gallo M.D.   On: 01/23/2019 10:04   Dg Pain  Clinic C-arm 1-60 Min No Report  Result Date: 01/22/2019 Fluoro was used, but no Radiologist interpretation will be provided. Please refer to "NOTES" tab  for provider progress note.   Assessment  The primary encounter diagnosis was Chronic pain syndrome. Diagnoses of Chronic low back pain (Primary Area of Pain) (Bilateral) (R>L), Chronic lower extremity pain (Secondary Area of Pain) (Bilateral) (R>L), DDD (degenerative disc disease), lumbar, Grade 1 Anterolisthesis of L5 over S1, L5 pars defect with spondylolisthesis (Bilateral), Lumbar facet syndrome (Bilateral) (R>L), Other intervertebral disc degeneration, lumbar region, Hyperlipidemia associated with type 2 diabetes mellitus ([place]), and Other polyneuropathy were also pertinent to this visit.  Plan of Care  I am having [name] maintain her amLODipine, calcium carbonate, multivitamin with minerals, valACYclovir, meloxicam, montelukast, fluticasone, acetaminophen, fexofenadine, predniSONE, Enbrel SureClick, esomeprazole, betamethasone dipropionate, atenolol, atorvastatin, lisinopril, and gabapentin.  Pharmacotherapy (Medications Ordered): No orders of the defined types were placed in this encounter.  Orders:  Orders Placed This Encounter  Procedures  . MR LUMBAR SPINE WO CONTRAST    In addition to any acute findings, please report on degenerative changes related to: (Please specify level(s)) (1) ROM & instability (>7mm displacement) (2) Facet joint (Zygoapophyseal Joint) (3) DDD and/or IVDD (4) Pars defects (5) Previous surgical changes (Include description of hardware and hardware status, if present) (6) Presence and degree of spondylolisthesis, spondylosis, and/or spondyloarthropathies)  (7) Old Fractures (8) Demineralization (9) Additional bone pathology (10) Stenosis (Central, Lateral Recess, Foraminal) (11) If at all possible, please provide AP diameter (mm) of foraminal and/or central canal.    Standing Status:   Future    Standing Expiration Date:   05/19/2019    Order Specific Question:   What is the patient's sedation requirement?    Answer:   No Sedation    Order  Specific Question:   Does the patient have a pacemaker or implanted devices?    Answer:   No    Order Specific Question:   Preferred imaging location?    Answer:   [facility] (table limit-350lbs)    Order Specific Question:   Call Results- Best Contact Number?    Answer:   [phone] ([facility])    Order Specific Question:   Radiology Contrast Protocol - do NOT remove file path    Answer:   [email or web address]  . NCV with EMG(electromyography)    Bilateral testing requested.    Standing Status:   Future    Standing Expiration Date:   02/16/2020    Scheduling Instructions:     Please refer this patient to [facility] for Nerve Conduction testing of the lower extremities. (EMG & PNCV)    Order Specific Question:   Where should this test be performed?    Answer:   Other   Follow-up plan:   Return for (VV), E/M to review the results of the nerve conduction test and lumbar MRI.Marland Kitchen      Interventional management options: Planned, scheduled, and/or pending:   None at this time.    Considering:   Possible bilateral lumbar facet RFA Diagnostic bilateral SI joint block Possible bilateral SI joint RFA Diagnostic bilateral hip joint injection Possible bilateral femoral +obturator NB Possible bilateral femoral + obturator nerve RFA Diagnostic right L4-5 LESI #2   Palliative PRN treatment(s):   Palliativebilateral lumbar facet block#6(diagnostic lumbar facet blocks provide the patient with 90 to 100% relief of the pain, unfortunately they did not last.  The insurance company has denied radiofrequency ablation for some unknown reason.)  Recent Visits Date Type Provider Dept  01/22/19 Procedure visit Milinda Pointer, MD [facility]  01/14/19 Office Visit Milinda Pointer, MD [facility]  12/17/18 Office Visit Milinda Pointer, MD [facility]  Showing recent visits within past 90 days  and meeting all other requirements   Today's Visits Date Type Provider Dept  02/16/19 Office Visit Milinda Pointer, MD [facility]  Showing today's visits and meeting all other requirements   Future Appointments No visits were found meeting these conditions.  Showing future appointments within next 90 days and meeting all other requirements   I discussed the assessment and treatment plan with the patient. The patient was provided an opportunity to ask questions and all were answered. The patient agreed with the plan and demonstrated an understanding of the instructions.  Patient advised to call back or seek an in-person evaluation if the symptoms or condition worsens.  Total duration of non-face-to-face encounter: 18 minutes.  Note by: Gaspar Cola, MD Date: 02/16/2019; Time: 2:24 PM  Note: This dictation was prepared with Dragon dictation. Any transcriptional errors that may result from this process are unintentional.  Disclaimer:  * Given the special circumstances of the COVID-19 pandemic, the federal government has announced that the Office for Civil Rights (OCR) will exercise its enforcement discretion and will not impose penalties on physicians using telehealth in the event of noncompliance with regulatory requirements under the [place] (HIPAA) in connection with the good faith provision of telehealth during the XX123456 national public health emergency. (Alex)

## 2019-02-16 ENCOUNTER — Other Ambulatory Visit: Payer: Self-pay

## 2019-02-16 ENCOUNTER — Ambulatory Visit: Payer: BC Managed Care – PPO | Attending: Pain Medicine | Admitting: Pain Medicine

## 2019-02-16 DIAGNOSIS — M5442 Lumbago with sciatica, left side: Secondary | ICD-10-CM

## 2019-02-16 DIAGNOSIS — M5136 Other intervertebral disc degeneration, lumbar region: Secondary | ICD-10-CM | POA: Diagnosis not present

## 2019-02-16 DIAGNOSIS — E1169 Type 2 diabetes mellitus with other specified complication: Secondary | ICD-10-CM

## 2019-02-16 DIAGNOSIS — G8929 Other chronic pain: Secondary | ICD-10-CM

## 2019-02-16 DIAGNOSIS — M5441 Lumbago with sciatica, right side: Secondary | ICD-10-CM

## 2019-02-16 DIAGNOSIS — G6289 Other specified polyneuropathies: Secondary | ICD-10-CM

## 2019-02-16 DIAGNOSIS — M79605 Pain in left leg: Secondary | ICD-10-CM

## 2019-02-16 DIAGNOSIS — G894 Chronic pain syndrome: Secondary | ICD-10-CM | POA: Diagnosis not present

## 2019-02-16 DIAGNOSIS — M79604 Pain in right leg: Secondary | ICD-10-CM | POA: Diagnosis not present

## 2019-02-16 DIAGNOSIS — M47816 Spondylosis without myelopathy or radiculopathy, lumbar region: Secondary | ICD-10-CM

## 2019-02-16 DIAGNOSIS — M51369 Other intervertebral disc degeneration, lumbar region without mention of lumbar back pain or lower extremity pain: Secondary | ICD-10-CM

## 2019-02-16 DIAGNOSIS — M4310 Spondylolisthesis, site unspecified: Secondary | ICD-10-CM

## 2019-02-16 DIAGNOSIS — E785 Hyperlipidemia, unspecified: Secondary | ICD-10-CM

## 2019-02-16 DIAGNOSIS — M4300 Spondylolysis, site unspecified: Secondary | ICD-10-CM

## 2019-02-17 ENCOUNTER — Encounter: Payer: BC Managed Care – PPO | Admitting: Physical Therapy

## 2019-02-19 ENCOUNTER — Encounter: Payer: BC Managed Care – PPO | Admitting: Physical Therapy

## 2019-02-24 ENCOUNTER — Encounter: Payer: BC Managed Care – PPO | Admitting: Physical Therapy

## 2019-02-26 ENCOUNTER — Encounter: Payer: BC Managed Care – PPO | Admitting: Physical Therapy

## 2019-03-09 DIAGNOSIS — L812 Freckles: Secondary | ICD-10-CM | POA: Diagnosis not present

## 2019-03-09 DIAGNOSIS — L821 Other seborrheic keratosis: Secondary | ICD-10-CM | POA: Diagnosis not present

## 2019-03-09 DIAGNOSIS — L820 Inflamed seborrheic keratosis: Secondary | ICD-10-CM | POA: Diagnosis not present

## 2019-03-09 DIAGNOSIS — D1801 Hemangioma of skin and subcutaneous tissue: Secondary | ICD-10-CM | POA: Diagnosis not present

## 2019-03-09 DIAGNOSIS — D225 Melanocytic nevi of trunk: Secondary | ICD-10-CM | POA: Diagnosis not present

## 2019-03-09 DIAGNOSIS — L570 Actinic keratosis: Secondary | ICD-10-CM | POA: Diagnosis not present

## 2019-03-13 ENCOUNTER — Other Ambulatory Visit: Payer: Self-pay | Admitting: Family Medicine

## 2019-03-13 DIAGNOSIS — Z1231 Encounter for screening mammogram for malignant neoplasm of breast: Secondary | ICD-10-CM

## 2019-03-16 DIAGNOSIS — R202 Paresthesia of skin: Secondary | ICD-10-CM | POA: Diagnosis not present

## 2019-03-16 DIAGNOSIS — M545 Low back pain: Secondary | ICD-10-CM | POA: Diagnosis not present

## 2019-03-16 DIAGNOSIS — R200 Anesthesia of skin: Secondary | ICD-10-CM | POA: Diagnosis not present

## 2019-03-19 DIAGNOSIS — M79604 Pain in right leg: Secondary | ICD-10-CM | POA: Insufficient documentation

## 2019-03-19 DIAGNOSIS — R200 Anesthesia of skin: Secondary | ICD-10-CM | POA: Insufficient documentation

## 2019-03-19 DIAGNOSIS — M79605 Pain in left leg: Secondary | ICD-10-CM | POA: Insufficient documentation

## 2019-03-20 ENCOUNTER — Other Ambulatory Visit: Payer: Self-pay | Admitting: Family Medicine

## 2019-03-20 NOTE — Telephone Encounter (Signed)
Requested medication (s) are due for refill today: yes  Requested medication (s) are on the active medication list: yes  Last refill:  10/27/2018  Future visit scheduled: no  Notes to clinic:  Last filled by historical provider Review for refill   Requested Prescriptions  Pending Prescriptions Disp Refills   atenolol (TENORMIN) 50 MG tablet [Pharmacy Med Name: ATENOLOL 50 MG TAB] 30 tablet     Sig: TAKE 1 TABLET BY MOUTH ONCE DAILY     Cardiovascular:  Beta Blockers Failed - 03/20/2019  1:56 PM      Failed - Last BP in normal range    BP Readings from Last 1 Encounters:  01/22/19 (!) 181/99         Passed - Last Heart Rate in normal range    Pulse Readings from Last 1 Encounters:  01/22/19 61         Passed - Valid encounter within last 6 months    Recent Outpatient Visits          2 months ago Hypertension associated with diabetes [facility])   [place], Dionne Bucy, MD   3 months ago Suspected Covid-19 Virus Infection   [facility] [place], [place], PA-C   5 months ago Psoriatic arthritis [facility])   [facility] [place], Dionne Bucy, MD              amLODipine (NORVASC) 5 MG tablet [Pharmacy Med Name: AMLODIPINE BESYLATE 5 MG TAB] 90 tablet     Sig: TAKE 1 TABLET BY MOUTH ONCE A DAY AS NEEDED FOR BLOOD PRESSURE     Cardiovascular:  Calcium Channel Blockers Failed - 03/20/2019  1:56 PM      Failed - Last BP in normal range    BP Readings from Last 1 Encounters:  01/22/19 (!) 181/99         Passed - Valid encounter within last 6 months    Recent Outpatient Visits          2 months ago Hypertension associated with diabetes [facility])   [facility] [place], Dionne Bucy, MD   3 months ago Suspected Covid-19 Virus Infection   [place], [place], PA-C   5 months ago Psoriatic arthritis [facility])   [facility] [place], Dionne Bucy, MD

## 2019-03-24 ENCOUNTER — Ambulatory Visit
Admission: RE | Admit: 2019-03-24 | Discharge: 2019-03-24 | Disposition: A | Discharge status: Home / Self Care | Payer: BC Managed Care – PPO | Source: Ambulatory Visit | Attending: Family Medicine | Admitting: Family Medicine

## 2019-03-24 ENCOUNTER — Telehealth: Payer: Self-pay

## 2019-03-24 DIAGNOSIS — Z1231 Encounter for screening mammogram for malignant neoplasm of breast: Secondary | ICD-10-CM | POA: Insufficient documentation

## 2019-03-24 NOTE — Telephone Encounter (Signed)
-----   Message from Virginia Crews, MD sent at 03/24/2019  3:55 PM EST ----- Normal mammogram. Repeat in 1 yr

## 2019-03-24 NOTE — Telephone Encounter (Signed)
NA. [facility] may give results.

## 2019-03-24 NOTE — Telephone Encounter (Signed)
Copied from [place] [phone]. Topic: General - Other >> Mar 24, 2019  4:24 PM Greggory Keen D wrote: Reason for CRM: pt called saying she had a positive covid test in August. She had an appt Sept 3rd with Dr. B for a 6 month FU.  She didn't talk to her about the cough then because she was just getting over covid symptoms.  She states though now she has been having a little cough eveery now and then like a dry cough.  She want to know what she can do about it or be concerned.  Best #  [phone]

## 2019-03-24 NOTE — Telephone Encounter (Signed)
Viewed by [name] on 03/24/2019 4:20 PM

## 2019-03-24 NOTE — Telephone Encounter (Signed)
NA. Voice mail not available.

## 2019-03-27 NOTE — Telephone Encounter (Signed)
Patient reports she is having dry cough worse at night. Patient reports she has not tried any OTC medications for cough. Patient denies any shortness of breath, phlegm, or fever. Please advise.

## 2019-03-27 NOTE — Telephone Encounter (Signed)
She needs to schedule virtual visit with Dr. B next week. Go to ER if any shortness of breath, chest pain or high fever.

## 2019-03-27 NOTE — Telephone Encounter (Signed)
LMTCB 03/27/2019   Thanks,   -Mickel Baas

## 2019-03-30 ENCOUNTER — Ambulatory Visit (INDEPENDENT_AMBULATORY_CARE_PROVIDER_SITE_OTHER): Payer: BC Managed Care – PPO | Admitting: Family Medicine

## 2019-03-30 ENCOUNTER — Encounter: Payer: Self-pay | Admitting: Family Medicine

## 2019-03-30 VITALS — Wt 148.0 lb

## 2019-03-30 DIAGNOSIS — R05 Cough: Secondary | ICD-10-CM

## 2019-03-30 DIAGNOSIS — R053 Chronic cough: Secondary | ICD-10-CM

## 2019-03-30 MED ORDER — MONTELUKAST SODIUM 10 MG PO TABS: 10.0000 mg | ORAL_TABLET | Freq: Every day | ORAL | 3 refills | Status: DC

## 2019-03-30 MED ORDER — ESOMEPRAZOLE MAGNESIUM 40 MG PO CPDR: 40.0000 mg | DELAYED_RELEASE_CAPSULE | Freq: Every day | ORAL | 3 refills | Status: DC

## 2019-03-30 MED ORDER — LOSARTAN POTASSIUM 25 MG PO TABS: 12.5000 mg | ORAL_TABLET | Freq: Every day | ORAL | 3 refills | Status: DC

## 2019-03-30 NOTE — Progress Notes (Signed)
Patient: Ashley Rodriguez Female    DOB: Sep 07, 1950   68 y.o.   MRN: [id number] Visit Date: 03/30/2019  Today's Provider: Lavon Paganini, MD   Chief Complaint  Patient presents with  . Cough   Subjective:    Virtual Visit via Video Note  I connected with Ashley Rodriguez on 03/30/19 at  1:20 PM EST by a video enabled telemedicine application and verified that I am speaking with the correct person using two identifiers.   Patient location: home Provider location: [place] involved in the visit: patient, provider   I discussed the limitations of evaluation and management by telemedicine and the availability of in person appointments. The patient expressed understanding and agreed to proceed.  Interactive audio and video communications were attempted, although failed due to patient's inability to connect to video. Continued visit with audio only interaction with patient agreement.    HPI Patient reports cough x's three weeks or longer. Patient reports cough is worse at night. Patient reports cough has been better in the last two days.  Seems to have gotten better after having COVID in August 2020, but then came back.  She has good days and bad days.  Non-productive.  No intense fatigue or fevers like she had with COVID19.  Feels like it may have started after starting Lisinopril in 12/2018.   Allergies  Allergen Reactions  . Codeine Rash  . Sulfa Antibiotics Rash     Current Outpatient Medications:  .  acetaminophen (TYLENOL) 325 MG tablet, Take 325 mg by mouth every 6 (six) hours as needed., Disp: , Rfl:  .  amLODipine (NORVASC) 5 MG tablet, TAKE 1 TABLET BY MOUTH ONCE A DAY AS NEEDED FOR BLOOD PRESSURE (Patient taking differently: Take 5 mg by mouth daily. ), Disp: 90 tablet, Rfl: 1 .  atenolol (TENORMIN) 50 MG tablet, TAKE 1 TABLET BY MOUTH ONCE DAILY, Disp: 30 tablet, Rfl: 3 .  atorvastatin (LIPITOR) 20 MG tablet, Take 1 tablet (20 mg total) by  mouth daily., Disp: 90 tablet, Rfl: 1 .  betamethasone dipropionate 0.05 % lotion, Apply 1 application topically as needed., Disp: , Rfl:  .  calcium carbonate (CALCIUM 600) 600 MG TABS tablet, Take 600 mg by mouth daily with breakfast., Disp: , Rfl:  .  ENBREL SURECLICK 50 MG/ML injection, , Disp: , Rfl: 4 .  esomeprazole (NEXIUM) 40 MG capsule, Take 40 mg by mouth daily., Disp: , Rfl:  .  fexofenadine (ALLEGRA) 180 MG tablet, Take 180 mg by mouth daily., Disp: , Rfl:  .  fluticasone (FLONASE) 50 MCG/ACT nasal spray, , Disp: , Rfl: 4 .  gabapentin (NEURONTIN) 300 MG capsule, Take 1 capsule (300 mg total) by mouth 3 (three) times daily., Disp: 270 capsule, Rfl: 3 .  lisinopril (ZESTRIL) 5 MG tablet, Take 1 tablet (5 mg total) by mouth daily., Disp: 90 tablet, Rfl: 1 .  meloxicam (MOBIC) 15 MG tablet, Take 15 mg by mouth daily. , Disp: , Rfl: 2 .  montelukast (SINGULAIR) 10 MG tablet, Take 10 mg by mouth at bedtime. , Disp: , Rfl: 11 .  Multiple Vitamins-Minerals (MULTIVITAMIN WITH MINERALS) tablet, Take 1 tablet by mouth daily., Disp: , Rfl:  .  predniSONE (DELTASONE) 5 MG tablet, Take 2 mg by mouth daily with breakfast. , Disp: , Rfl:  .  valACYclovir (VALTREX) 500 MG tablet, Take 500 mg by mouth as needed., Disp: , Rfl:   Review of Systems  Constitutional: Negative.  HENT: Negative for congestion, ear pain, postnasal drip, rhinorrhea, sinus pressure, sinus pain and sore throat.   Respiratory: Positive for cough. Negative for shortness of breath and wheezing.     Social History   Tobacco Use  . Smoking status: Never Smoker  . Smokeless tobacco: Never Used  Substance Use Topics  . Alcohol use: Yes    Alcohol/week: 4.0 standard drinks    Types: 4 Standard drinks or equivalent per week      Objective:   Wt 148 lb (67.1 kg)   BMI 27.96 kg/m  Vitals:   03/30/19 1030  Weight: 148 lb (67.1 kg)  Body mass index is 27.96 kg/m.   Physical Exam Speaking in full sentences in NAD   No results found for any visits on 03/30/19.     Assessment & Plan    I discussed the assessment and treatment plan with the patient. The patient was provided an opportunity to ask questions and all were answered. The patient agreed with the plan and demonstrated an understanding of the instructions.   The patient was advised to call back or seek an in-person evaluation if the symptoms worsen or if the condition fails to improve as anticipated.  1. Chronic cough - given timeline, suspect ACEi cough - could be post-viral, but duration is long for that - no symptoms to suggest CAP, asthma, bronchitis or other infectious process - will stop lisinopril and start low dose losartan for microalbuminuria instead - return precautions discussed  Other orders - losartan (COZAAR) 25 MG tablet; Take 0.5 tablets (12.5 mg total) by mouth daily.  Dispense: 15 tablet; Refill: 3  Approximately 25 minutes was spent in discussion of which greater than 50% was consultation.    The entirety of the information documented in the History of Present Illness, Review of Systems and Physical Exam were personally obtained by me. Portions of this information were initially documented by Lynford Humphrey, CMA and reviewed by me for thoroughness and accuracy.    Lakshmi Sundeen, Dionne Bucy, MD MPH [facility]

## 2019-04-19 NOTE — Progress Notes (Signed)
NOTE: Information contained herein reflects provider's review and annotations entered in association with encounter. Patient information is provided elsewhere in the medical record. Interpretation of information and data should be left to medically trained personnel. Virtual Encounter - Pain Management   Contact & Pharmacy Preferred: [phone] Home: [phone] (home) Mobile: [phone] (mobile) E-mail: [email or web address]  [place] [place] [facility] [place] [address] [place] [address] Phone: [phone] Fax: [phone]    Pre-screening  Ashley Rodriguez offered "in-person" vs "virtual" encounter. She indicated preferring virtual for this encounter.   Reason COVID-19*  Social distancing based on CDC and AMA recommendations.   I contacted Ashley Rodriguez on 04/20/2019 via telephone.      I clearly identified myself as Gaspar Cola, MD. I verified that I was speaking with the correct person using two identifiers (Name: Ashley Rodriguez, and date of birth: 23-Aug-1950).  Consent I sought verbal advanced consent from Ashley Rodriguez for virtual visit interactions. I informed Ashley Rodriguez of possible security and privacy concerns, risks, and limitations associated with providing "not-in-person" medical evaluation and management services. I also informed Ashley Rodriguez of the availability of "in-person" appointments. Finally, I informed her that there would be a charge for the virtual visit and that she could be  personally, fully or partially, financially responsible for it. Ashley Rodriguez expressed understanding and agreed to proceed.   Historic Elements   Ashley Rodriguez is a 68 y.o. year old, female patient evaluated today after her last encounter by our practice on 02/16/2019. Ashley Rodriguez  has a past medical history of COVID-19 (12/15/2018), Flank lipoma (08/17/2016), Hypertension, Neck mass (04/07/2013), Psoriatic arthritis ([place]), Recurrent cellulitis,  and T2DM (type 2 diabetes mellitus) ([place]) (10/01/2018). She also  has a past surgical history that includes Abdominal hysterectomy (2000); lipoma removed (04/2012); Colonoscopy (2015); and Neck mass excision (04/28/14). Ashley Rodriguez has a current medication list which includes the following prescription(s): acetaminophen, amlodipine, atenolol, atorvastatin, betamethasone dipropionate, calcium carbonate, enbrel sureclick, esomeprazole, fexofenadine, fluticasone, gabapentin, losartan, meloxicam, montelukast, multivitamin with minerals, prednisone, valacyclovir, and diazepam. She  reports that she has never smoked. She has never used smokeless tobacco. She reports current alcohol use of about 4.0 standard drinks of alcohol per week. She reports that she does not use drugs. Ashley Rodriguez is allergic to codeine and sulfa antibiotics.   HPI  Today, she is being contacted for follow-up evaluation.  The patient tested positive for COVID-19 on 12/15/2018.  On 01/22/2019 she came in for a right L4-5 LESI #1.  On 02/16/2019 she reported that she had attained 90% relief of the pain x 2 weeks.  On 02/16/2019 I ordered an MRI of the lumbar spine as well as a nerve conduction test (EMG/PNCV) of both lower extremities.  The nerve conduction test was completed by Dr. Gurney Maxin on 03/16/2019 and it reveals electrodiagnostic evidence of chronic, mild to moderate severity right lower lumbar polyradiculopathy.   X-rays of the lumbar spine done on 01/23/2019 revealed scoliosis and multilevel degenerative changes with a grade 1 anterolisthesis of L5 over S1 due to bilateral pars defect of L5 with no abnormal flexion and extension movements.  The MRI ordered on 02/16/2019 has not been done as of today.  The patient indicates that the initial MRI was denied.  Further questioning revealed that the patient has a history of claustrophobia and indicates not having been able to MRIs because of this.  Today we talked about this and how we can  prescribe some Valium to  assist with it.  The patient was reminded to have a driver with her.  I sent a prescription for Valium to her pharmacy.  I have also requested to have the MRI done at a place where the MRI can handle patients of up to 550 pounds.  Hopefully this means that the device is sufficiently big to accommodate a patient of normal size, rather comfortably.  Today I took the time to go over the results of all of her testing and I have pointed out that now that we have electro diagnostic evidence of a polyradiculopathy, we definitely need to have the MRI done.  Initially she wanted to avoid the MRI but simply requesting the radiofrequency ablation again, but this is her typical pattern of trying to go for the bare minimum, which has not worked in the past and it will probably not work in the future.  In any case, I took the time to explain to the patient that her lower extremity symptoms were secondary to her poorly radiculopathy as opposed to lumbar facet problems.  Since this seems to be bothering her the most today, I have encouraged her to essentially work with me and have her MRI done so that we can take a look at what is going on and determining what our next step will be.  Pharmacotherapy Assessment  Analgesic: No opioid analgesics from our practice.   Monitoring: Pharmacotherapy: No side-effects or adverse reactions reported. [place] PMP: PDMP reviewed during this encounter.       Compliance: No problems identified. Effectiveness: Clinically acceptable. Plan: Refer to "POC".  UDS: No results found for: SUMMARY Laboratory Chemistry Profile (12 mo)  Renal: 01/01/2019: BUN 14; BUN/Creatinine Ratio 23; Creatinine, Ser 0.61  Lab Results  Component Value Date   GFRAA 109 01/01/2019   GFRNONAA 95 01/01/2019   Hepatic: 01/01/2019: Albumin 4.5 Lab Results  Component Value Date   AST 22 01/01/2019   ALT 22 01/01/2019   Other: No results found for requested labs within last 8760  hours. Note: Above Lab results reviewed.  Imaging  MM 3D SCREEN BREAST BILATERAL CLINICAL DATA:  Screening.  EXAM: DIGITAL SCREENING BILATERAL MAMMOGRAM WITH TOMO AND CAD  COMPARISON:  Previous exam(s).  ACR Breast Density Category c: The breast tissue is heterogeneously dense, which may obscure small masses.  FINDINGS: There are no findings suspicious for malignancy. Images were processed with CAD.  IMPRESSION: No mammographic evidence of malignancy. A result letter of this screening mammogram will be mailed directly to the patient.  RECOMMENDATION: Screening mammogram in one year. (Code:SM-B-01Y)  BI-RADS CATEGORY  1: Negative.  Electronically Signed   By: Ammie Ferrier M.D.   On: 03/24/2019 15:29  Assessment  The primary encounter diagnosis was Lumbosacral radiculopathy (Right). Diagnoses of Chronic lower lumbar polyradiculopathy (Right), Abnormal EMG (chronic right lower lumbar polyradiculopathy), Lumbar spondylosis w/ polyradiculopathy (Right), Grade 1 Anterolisthesis of L5 over S1, L5 pars defect with spondylolisthesis (Bilateral), Chronic lower extremity pain (Secondary Area of Pain) (Bilateral) (R>L), DDD (degenerative disc disease), lumbosacral, Other intervertebral disc degeneration, lumbar region, Spondylolisthesis of lumbosacral region, Chronic low back pain (Primary Area of Pain) (Bilateral) (R>L), History of claustrophobia, and Chronic pain syndrome were also pertinent to this visit.  Plan of Care  Problem-specific:  No problem-specific Assessment & Plan notes found for this encounter.  I am having Ashley Rodriguez start on diazepam. I am also having her maintain her calcium carbonate, multivitamin with minerals, valACYclovir, meloxicam, fluticasone, acetaminophen, fexofenadine, predniSONE, Enbrel SureClick, betamethasone dipropionate,  atorvastatin, gabapentin, atenolol, amLODipine, losartan, montelukast, and esomeprazole.  Pharmacotherapy (Medications  Ordered): Meds ordered this encounter  Medications  . diazepam (VALIUM) 5 MG tablet    Sig: Take 1 tablet (5 mg total) by mouth as needed for up to 2 doses for anxiety (Take one tab 45 minutes before MRI. Take second tablet just prior to MRI scan). Do not take medication within 4 hours of taking opioid pain medications. Must have a driver. Do not drive or operate machinery x 24 hours after taking this medication.    Dispense:  2 tablet    Refill:  0    Must have a driver. Do not drive or operate machinery x 24 hours after taking this medication.   Orders:  Orders Placed This Encounter  Procedures  . MR Lumbar Spine w/o contrast    Patient presents with axial pain with possible radicular component.  In addition to any acute findings, please report on:  1. Facet (Zygapophyseal) joint DJD (Hypertrophy, space narrowing, subchondral sclerosis, and/or osteophyte formation) 2. DDD and/or IVDD (Loss of disc height, desiccation or "Black disc disease") 3. Pars defects 4. Spondylolisthesis, spondylosis, and/or spondyloarthropathies (include Degree/Grade of displacement in mm) 5. Vertebral body Fractures, including age (old, new/acute) 65. Modic Type Changes 7. Demineralization 8. Bone pathology 9. Central, Lateral Recess, and/or Foraminal Stenosis (include AP diameter of stenosis in mm) 10. Surgical changes (hardware type, status, and presence of fibrosis)  NOTE: Please specify level(s) and laterality.    Standing Status:   Future    Standing Expiration Date:   07/19/2019    Order Specific Question:   What is the patient's sedation requirement?    Answer:   Oral Sedation    Order Specific Question:   Does the patient have a pacemaker or implanted devices?    Answer:   No    Order Specific Question:   Preferred imaging location?    Answer:   [facility] (table limit-550lbs)    Order Specific Question:   Call Results- Best Contact Number?    Answer:   [phone] ([facility])     Order Specific Question:   Radiology Contrast Protocol - do NOT remove file path    Answer:   [email or web address]    Order Specific Question:   ** REASON FOR EXAM (FREE TEXT)    Answer:   The patient has EMG/PNCV evidence of a right lower lumbar polyradiculopathy.   Follow-up plan:   Return for (VV), (s/p Tests) to evaluate the results of her lumbar MRI.Marland Kitchen      Interventional management options: Planned, scheduled, and/or pending:   None at this time.    Considering:   Possible bilateral lumbar facet RFA Diagnostic bilateral SI joint block Possible bilateral SI joint RFA Diagnostic bilateral hip joint injection Possible bilateral femoral +obturator NB Possible bilateral femoral + obturator nerve RFA Diagnostic right L4-5 LESI #2   Palliative PRN treatment(s):   Palliativebilateral lumbar facet block#6(diagnostic lumbar facet blocks provide the patient with 90 to 100% relief of the pain, unfortunately they did not last.  The insurance company has denied radiofrequency ablation for some unknown reason.)     Recent Visits Date Type Provider Dept  02/16/19 Office Visit Milinda Pointer, MD [facility]  01/22/19 Procedure visit Milinda Pointer, MD [facility]  Showing recent visits within past 90 days and meeting all other requirements   Today's Visits Date Type Provider Dept  04/20/19 Telemedicine Milinda Pointer, MD [facility]  Showing  today's visits and meeting all other requirements   Future Appointments No visits were found meeting these conditions.  Showing future appointments within next 90 days and meeting all other requirements   I discussed the assessment and treatment plan with the patient. The patient was provided an opportunity to ask questions and all were answered. The patient agreed with the plan and demonstrated an understanding of the instructions.  Patient advised to call back or seek an  in-person evaluation if the symptoms or condition worsens.  Total duration of non-face-to-face encounter: 25 minutes.  Note by: Gaspar Cola, MD Date: 04/20/2019; Time: 2:10 PM  Note: This dictation was prepared with Dragon dictation. Any transcriptional errors that may result from this process are unintentional.

## 2019-04-20 ENCOUNTER — Other Ambulatory Visit: Payer: Self-pay

## 2019-04-20 ENCOUNTER — Ambulatory Visit: Payer: Medicare Other | Attending: Pain Medicine | Admitting: Pain Medicine

## 2019-04-20 ENCOUNTER — Encounter: Payer: Self-pay | Admitting: Pain Medicine

## 2019-04-20 DIAGNOSIS — M51379 Other intervertebral disc degeneration, lumbosacral region without mention of lumbar back pain or lower extremity pain: Secondary | ICD-10-CM

## 2019-04-20 DIAGNOSIS — Z8659 Personal history of other mental and behavioral disorders: Secondary | ICD-10-CM

## 2019-04-20 DIAGNOSIS — G8929 Other chronic pain: Secondary | ICD-10-CM

## 2019-04-20 DIAGNOSIS — M5442 Lumbago with sciatica, left side: Secondary | ICD-10-CM

## 2019-04-20 DIAGNOSIS — M4726 Other spondylosis with radiculopathy, lumbar region: Secondary | ICD-10-CM | POA: Diagnosis not present

## 2019-04-20 DIAGNOSIS — R94131 Abnormal electromyogram [EMG]: Secondary | ICD-10-CM

## 2019-04-20 DIAGNOSIS — M79604 Pain in right leg: Secondary | ICD-10-CM

## 2019-04-20 DIAGNOSIS — M5137 Other intervertebral disc degeneration, lumbosacral region: Secondary | ICD-10-CM | POA: Diagnosis not present

## 2019-04-20 DIAGNOSIS — M5136 Other intervertebral disc degeneration, lumbar region: Secondary | ICD-10-CM

## 2019-04-20 DIAGNOSIS — M5417 Radiculopathy, lumbosacral region: Secondary | ICD-10-CM

## 2019-04-20 DIAGNOSIS — M4307 Spondylolysis, lumbosacral region: Secondary | ICD-10-CM

## 2019-04-20 DIAGNOSIS — M5416 Radiculopathy, lumbar region: Secondary | ICD-10-CM

## 2019-04-20 DIAGNOSIS — M4300 Spondylolysis, site unspecified: Secondary | ICD-10-CM

## 2019-04-20 DIAGNOSIS — M79605 Pain in left leg: Secondary | ICD-10-CM

## 2019-04-20 DIAGNOSIS — M4310 Spondylolisthesis, site unspecified: Secondary | ICD-10-CM

## 2019-04-20 DIAGNOSIS — M4317 Spondylolisthesis, lumbosacral region: Secondary | ICD-10-CM | POA: Insufficient documentation

## 2019-04-20 DIAGNOSIS — G894 Chronic pain syndrome: Secondary | ICD-10-CM

## 2019-04-20 DIAGNOSIS — M51369 Other intervertebral disc degeneration, lumbar region without mention of lumbar back pain or lower extremity pain: Secondary | ICD-10-CM

## 2019-04-20 MED ORDER — DIAZEPAM 5 MG PO TABS: 5.0000 mg | ORAL_TABLET | ORAL | 0 refills | Status: DC | PRN

## 2019-05-05 ENCOUNTER — Ambulatory Visit: Payer: Medicare Other

## 2019-05-12 ENCOUNTER — Telehealth: Payer: Self-pay

## 2019-05-12 DIAGNOSIS — M5441 Lumbago with sciatica, right side: Secondary | ICD-10-CM

## 2019-05-12 DIAGNOSIS — G8929 Other chronic pain: Secondary | ICD-10-CM

## 2019-05-12 DIAGNOSIS — M4726 Other spondylosis with radiculopathy, lumbar region: Secondary | ICD-10-CM

## 2019-05-12 DIAGNOSIS — M5417 Radiculopathy, lumbosacral region: Secondary | ICD-10-CM

## 2019-05-12 DIAGNOSIS — M5416 Radiculopathy, lumbar region: Secondary | ICD-10-CM

## 2019-05-12 NOTE — Telephone Encounter (Signed)
Copied from [place] [id number]. Topic: Referral - Request for Referral >> May 12, 2019  9:46 AM Sheran Luz wrote: Has patient seen PCP for this complaint? Yes *If NO, is insurance requiring patient see PCP for this issue before PCP can refer them? Referral for which specialty: Pain mgmt Preferred provider/office: [facility] management [facility] Dr. Dossie Arbour

## 2019-05-12 NOTE — Telephone Encounter (Signed)
OK to re-refer. She is already a patient there

## 2019-05-12 NOTE — Addendum Note (Signed)
Addended by: Ashley Royalty E on: 05/12/2019 02:13 PM   Modules accepted: Orders

## 2019-05-13 ENCOUNTER — Other Ambulatory Visit: Payer: Self-pay | Admitting: Family Medicine

## 2019-05-14 ENCOUNTER — Other Ambulatory Visit: Payer: Self-pay | Admitting: Family Medicine

## 2019-05-14 NOTE — Telephone Encounter (Signed)
Pt has an appt on 07-02-2019 and need refill on atenolol. [facility]

## 2019-05-15 LAB — HM DIABETES EYE EXAM

## 2019-05-20 ENCOUNTER — Other Ambulatory Visit: Payer: Medicare Other

## 2019-05-26 ENCOUNTER — Telehealth: Payer: Self-pay | Admitting: *Deleted

## 2019-06-01 ENCOUNTER — Telehealth: Payer: Self-pay

## 2019-06-01 NOTE — Telephone Encounter (Signed)
Apt scheduled for 07/02/2019 at [facility].  Thanks,   -Mickel Baas

## 2019-06-01 NOTE — Telephone Encounter (Signed)
Copied from [place] [id number]. Topic: Appointment Scheduling - Scheduling Inquiry for Clinic >> Jun 01, 2019  2:46 PM Celene Kras wrote: Reason for CRM: Pt called stating that she had an AWV on 07/02/19. Pt states she called to get her MRI cancelled and they cancelled her appt for her AWV instead. PCP is booked out for a while. Please advise.

## 2019-06-04 ENCOUNTER — Ambulatory Visit: Payer: Medicare Other

## 2019-06-10 ENCOUNTER — Ambulatory Visit: Payer: Medicare Other

## 2019-06-11 ENCOUNTER — Other Ambulatory Visit: Payer: Self-pay | Admitting: Family Medicine

## 2019-06-16 ENCOUNTER — Ambulatory Visit: Payer: Medicare Other | Admitting: Pain Medicine

## 2019-07-02 ENCOUNTER — Ambulatory Visit (INDEPENDENT_AMBULATORY_CARE_PROVIDER_SITE_OTHER): Payer: Medicare Other | Admitting: Family Medicine

## 2019-07-02 ENCOUNTER — Other Ambulatory Visit: Payer: Self-pay

## 2019-07-02 ENCOUNTER — Ambulatory Visit: Payer: BC Managed Care – PPO | Admitting: Family Medicine

## 2019-07-02 ENCOUNTER — Encounter: Payer: Self-pay | Admitting: Family Medicine

## 2019-07-02 VITALS — BP 132/83 | HR 56 | Temp 97.1°F | Resp 16 | Ht 61.5 in | Wt 147.0 lb

## 2019-07-02 DIAGNOSIS — Z0000 Encounter for general adult medical examination without abnormal findings: Secondary | ICD-10-CM

## 2019-07-02 NOTE — Progress Notes (Signed)
Patient: Ashley Rodriguez, Female    DOB: 1951-02-05, 69 y.o.   MRN: [id number] Visit Date: 07/02/2019  Today's Provider: Lavon Paganini, MD   Chief Complaint  Patient presents with  . Medicare Wellness   Subjective:     Welcome to Medicare visit Ashley Rodriguez is a 69 y.o. female who presents today for health maintenance and complete physical. She feels well. She reports exercising yes. She reports she is sleeping fairly well. 03/24/2019 Mammogram-BI-RADS 1 11/11/2018 BMD-Osteopenia Pap-hysterectomy in 2000 -----------------------------------------------------------------   Review of Systems  Constitutional: Positive for activity change.  HENT: Positive for sinus pressure.   Eyes: Negative.   Respiratory: Positive for shortness of breath.   Cardiovascular: Negative.   Gastrointestinal: Negative.   Endocrine: Negative.   Genitourinary: Negative.   Musculoskeletal: Positive for arthralgias, back pain and myalgias.  Skin: Negative.   Allergic/Immunologic: Negative.   Neurological: Positive for dizziness.  Hematological: Negative.   Psychiatric/Behavioral: Negative.     Social History      She  reports that she has never smoked. She has never used smokeless tobacco. She reports current alcohol use of about 4.0 standard drinks of alcohol per week. She reports that she does not use drugs.       Social History   Socioeconomic History  . Marital status: Significant Other    Spouse name: Not on file  . Number of children: 1  . Years of education: Not on file  . Highest education level: Not on file  Occupational History  . Occupation: Marine scientist  Tobacco Use  . Smoking status: Never Smoker  . Smokeless tobacco: Never Used  Substance and Sexual Activity  . Alcohol use: Yes    Alcohol/week: 4.0 standard drinks    Types: 4 Standard drinks or equivalent per week  . Drug use: No  . Sexual activity: Not on file  Other Topics Concern  . Not on file   Social History Narrative  . Not on file   Social Determinants of Health   Financial Resource Strain:   . Difficulty of Paying Living Expenses: Not on file  Food Insecurity:   . Worried About Charity fundraiser in the Last Year: Not on file  . Ran Out of Food in the Last Year: Not on file  Transportation Needs:   . Lack of Transportation (Medical): Not on file  . Lack of Transportation (Non-Medical): Not on file  Physical Activity:   . Days of Exercise per Week: Not on file  . Minutes of Exercise per Session: Not on file  Stress:   . Feeling of Stress : Not on file  Social Connections:   . Frequency of Communication with Friends and Family: Not on file  . Frequency of Social Gatherings with Friends and Family: Not on file  . Attends Religious Services: Not on file  . Active Member of Clubs or Organizations: Not on file  . Attends Archivist Meetings: Not on file  . Marital Status: Not on file    Past Medical History:  Diagnosis Date  . COVID-19 12/15/2018  . Flank lipoma 08/17/2016  . Hypertension   . Neck mass 04/07/2013  . Psoriatic arthritis ([place])   . Recurrent cellulitis   . T2DM (type 2 diabetes mellitus) ([place]) 10/01/2018     Patient Active Problem List   Diagnosis Date Noted  . History of claustrophobia 04/20/2019  . Abnormal EMG (chronic right lower lumbar polyradiculopathy) 04/20/2019  .  Chronic lower lumbar polyradiculopathy (Right) 04/20/2019  . DDD (degenerative disc disease), lumbosacral 04/20/2019  . Lumbar spondylosis w/ polyradiculopathy (Right) 04/20/2019  . Lumbosacral radiculopathy (Right) 04/20/2019  . Other intervertebral disc degeneration, lumbar region 04/20/2019  . Spondylolisthesis of lumbosacral region 04/20/2019  . Numbness and tingling of foot 03/19/2019  . Low back pain radiating to both legs 03/19/2019  . Encounter for long-term (current) use of high-risk medication 10/23/2018  . Screening for osteoporosis 10/23/2018  .  Generalized osteoarthritis of hand 10/23/2018  . T2DM (type 2 diabetes mellitus) ([place]) 10/01/2018  . Peripheral neuropathy 10/01/2018  . Hyperlipidemia associated with type 2 diabetes mellitus ([place]) 10/01/2018  . Hypertension associated with diabetes ([place]) 10/01/2018  . Spondylosis without myelopathy or radiculopathy, lumbosacral region 08/19/2017  . Osteoarthritis of lumbar spine 04/18/2017  . Grade 1 Anterolisthesis of L5 over S1 01/16/2017  . L5 pars defect with spondylolisthesis (Bilateral) 01/16/2017  . NSAID long-term use 01/16/2017  . Disorder of skeletal system 01/16/2017  . Pharmacologic therapy 01/16/2017  . Problems influencing health status 01/16/2017  . DDD (degenerative disc disease), lumbar 01/16/2017  . Lumbar facet syndrome (Bilateral) (R>L) 01/16/2017  . Numbness and tingling (lower extremities) (Bilateral) 01/16/2017  . Chronic pain syndrome 01/16/2017  . Chronic musculoskeletal pain 01/16/2017  . Neurogenic pain 01/16/2017  . Chronic sacroiliac joint pain (Bilateral) (R>L) 01/16/2017  . Chronic low back pain (Primary Area of Pain) (Bilateral) (R>L) 01/03/2017  . Chronic lower extremity pain (Secondary Area of Pain) (Bilateral) (R>L) 01/03/2017  . Psoriatic arthritis ([place]) 03/04/2013    Past Surgical History:  Procedure Laterality Date  . ABDOMINAL HYSTERECTOMY  2000  . COLONOSCOPY  2015  . lipoma removed  04/2012   on neck  . NECK MASS EXCISION  04/28/14   lipoma    Family History        Family Status  Relation Name Status  . Mother  (Not Specified)  . Neg Hx  (Not Specified)        Her family history includes Healthy in her mother. There is no history of Breast cancer or Colon cancer.      Allergies  Allergen Reactions  . Codeine Rash  . Sulfa Antibiotics Rash     Current Outpatient Medications:  .  acetaminophen (TYLENOL) 325 MG tablet, Take 325 mg by mouth every 6 (six) hours as needed., Disp: , Rfl:  .  amLODipine (NORVASC) 5 MG tablet,  TAKE 1 TABLET BY MOUTH ONCE A DAY AS NEEDED FOR BLOOD PRESSURE (Patient taking differently: Take 5 mg by mouth daily. ), Disp: 90 tablet, Rfl: 1 .  atenolol (TENORMIN) 50 MG tablet, TAKE 1 TABLET BY MOUTH ONCE DAILY, Disp: 90 tablet, Rfl: 0 .  atorvastatin (LIPITOR) 20 MG tablet, TAKE 1 TABLET BY MOUTH ONCE DAILY, Disp: 90 tablet, Rfl: 1 .  betamethasone dipropionate 0.05 % lotion, Apply 1 application topically as needed., Disp: , Rfl:  .  calcium carbonate (CALCIUM 600) 600 MG TABS tablet, Take 600 mg by mouth daily with breakfast., Disp: , Rfl:  .  ENBREL SURECLICK 50 MG/ML injection, , Disp: , Rfl: 4 .  esomeprazole (NEXIUM) 40 MG capsule, Take 1 capsule (40 mg total) by mouth daily., Disp: 90 capsule, Rfl: 3 .  fexofenadine (ALLEGRA) 180 MG tablet, Take 180 mg by mouth daily., Disp: , Rfl:  .  fluticasone (FLONASE) 50 MCG/ACT nasal spray, , Disp: , Rfl: 4 .  gabapentin (NEURONTIN) 300 MG capsule, Take 1 capsule (300 mg total) by mouth  3 (three) times daily., Disp: 270 capsule, Rfl: 3 .  losartan (COZAAR) 25 MG tablet, Take 0.5 tablets (12.5 mg total) by mouth daily., Disp: 15 tablet, Rfl: 3 .  meloxicam (MOBIC) 15 MG tablet, Take 15 mg by mouth daily. , Disp: , Rfl: 2 .  montelukast (SINGULAIR) 10 MG tablet, Take 1 tablet (10 mg total) by mouth at bedtime., Disp: 90 tablet, Rfl: 3 .  Multiple Vitamins-Minerals (MULTIVITAMIN WITH MINERALS) tablet, Take 1 tablet by mouth daily., Disp: , Rfl:  .  TURMERIC PO, Take by mouth., Disp: , Rfl:  .  valACYclovir (VALTREX) 500 MG tablet, Take 500 mg by mouth as needed., Disp: , Rfl:  .  diazepam (VALIUM) 5 MG tablet, Take 1 tablet (5 mg total) by mouth as needed for up to 2 doses for anxiety (Take one tab 45 minutes before MRI. Take second tablet just prior to MRI scan). Do not take medication within 4 hours of taking opioid pain medications. Must have a driver. Do not drive or operate machinery x 24 hours after taking this medication. (Patient not taking:  Reported on 07/02/2019), Disp: 2 tablet, Rfl: 0 .  predniSONE (DELTASONE) 5 MG tablet, Take 1 mg by mouth daily with breakfast. , Disp: , Rfl:    Patient Care Team: Virginia Crews, MD as PCP - General (Family Medicine) Robert Bellow, MD (General Surgery) Gaynelle Arabian, MD as Consulting Physician (Family Medicine)    Objective:    Vitals: BP 132/83 (BP Location: Left Arm, Patient Position: Sitting, Cuff Size: Normal)   Pulse (!) 56   Temp (!) 97.1 F (36.2 C) (Temporal)   Resp 16   Ht 5' 1.5" (1.562 m)   Wt 147 lb (66.7 kg)   BMI 27.33 kg/m    Vitals:   07/02/19 0901  BP: 132/83  Pulse: (!) 56  Resp: 16  Temp: (!) 97.1 F (36.2 C)  TempSrc: Temporal  Weight: 147 lb (66.7 kg)  Height: 5' 1.5" (1.562 m)     Physical Exam  Depression Screen PHQ 2/9 Scores 07/02/2019 01/22/2019 10/01/2018 04/17/2018  PHQ - 2 Score 0 0 0 0  PHQ- 9 Score 4 - 4 -   Functional Status Survey: Is the patient deaf or have difficulty hearing?: No Does the patient have difficulty seeing, even when wearing glasses/contacts?: No Does the patient have difficulty concentrating, remembering, or making decisions?: No Does the patient have difficulty walking or climbing stairs?: Yes Does the patient have difficulty dressing or bathing?: No Does the patient have difficulty doing errands alone such as visiting a doctor's office or shopping?: No  Audit-C Alcohol Use Screening   Alcohol Use Disorder Test (AUDIT) 10/01/2018 07/02/2019  1. How often do you have a drink containing alcohol? 3 3  2. How many drinks containing alcohol do you have on a typical day when you are drinking? 1 0  3. How often do you have six or more drinks on one occasion? 0 2  AUDIT-C Score 4 5  4. How often during the last year have you found that you were not able to stop drinking once you had started? 0 0  5. How often during the last year have you failed to do what was normally expected from you becasue of drinking? 0 0    6. How often during the last year have you needed a first drink in the morning to get yourself going after a heavy drinking session? 0 0  7. How often during  the last year have you had a feeling of guilt of remorse after drinking? 0 0  8. How often during the last year have you been unable to remember what happened the night before because you had been drinking? 0 0  9. Have you or someone else been injured as a result of your drinking? 0 0  10. Has a relative or friend or a doctor or another health worker been concerned about your drinking or suggested you cut down? 0 0  Alcohol Use Disorder Identification Test Final Score (AUDIT) 4 5  Alcohol Brief Interventions/Follow-up - AUDIT Score <7 follow-up not indicated   A score of 3 or more in women, and 4 or more in men indicates increased risk for alcohol abuse, EXCEPT if all of the points are from question 1  6CIT Screen 07/02/2019  What Year? 0 points  What month? 0 points  What time? 0 points  Count back from 20 0 points  Months in reverse 0 points  Repeat phrase 0 points  Total Score 0   EKG reviewed.  Sinus bradycardia.  Unchanged from previous in 2015    Assessment & Plan:     Routine Health Maintenance and Physical Exam  Exercise Activities and Dietary recommendations Goals   None     Immunization History  Administered Date(s) Administered  . Fluad Quad(high Dose 65+) 01/01/2019  . Pneumococcal Conjugate-13 09/20/2014  . Pneumococcal Polysaccharide-23 01/09/2012, 03/13/2018  . Td 09/08/2013  . Tdap 12/31/2005    Health Maintenance  Topic Date Due  . COLONOSCOPY  10/20/2002  . DEXA SCAN  10/19/2017  . OPHTHALMOLOGY EXAM  04/29/2018  . HEMOGLOBIN A1C  07/01/2019  . FOOT EXAM  01/01/2020  . MAMMOGRAM  03/23/2021  . TETANUS/TDAP  09/09/2023  . INFLUENZA VACCINE  Completed  . Hepatitis C Screening  Completed  . PNA vac Low Risk Adult  Completed     Discussed health benefits of physical activity, and encouraged  her to engage in regular exercise appropriate for her age and condition.    Abstracted recent DEXA scan We will send ROI for eye exam and colonoscopy -------------------------------------------------------------------- Problem List Items Addressed This Visit    None    Visit Diagnoses    Welcome to Medicare preventive visit    -  Primary   Relevant Orders   EKG 12-Lead (Completed)       Return in about 4 weeks (around 07/30/2019) for chronic disease f/u.   The entirety of the information documented in the History of Present Illness, Review of Systems and Physical Exam were personally obtained by me. Portions of this information were initially documented by Lynford Humphrey, CMA and reviewed by me for thoroughness and accuracy.    Sherrell Weir, Dionne Bucy, MD MPH [facility]

## 2019-07-02 NOTE — Patient Instructions (Addendum)
The CDC recommends two doses of Shingrix (the shingles vaccine) separated by 2 to 6 months for adults age 67 years and older. I recommend checking with your insurance plan regarding coverage for this vaccine.      Preventive Care 52 Years and Older, Female Preventive care refers to lifestyle choices and visits with your health care provider that can promote health and wellness. This includes:  A yearly physical exam. This is also called an annual well check.  Regular dental and eye exams.  Immunizations.  Screening for certain conditions.  Healthy lifestyle choices, such as diet and exercise. What can I expect for my preventive care visit? Physical exam Your health care provider will check:  Height and weight. These may be used to calculate body mass index (BMI), which is a measurement that tells if you are at a healthy weight.  Heart rate and blood pressure.  Your skin for abnormal spots. Counseling Your health care provider may ask you questions about:  Alcohol, tobacco, and drug use.  Emotional well-being.  Home and relationship well-being.  Sexual activity.  Eating habits.  History of falls.  Memory and ability to understand (cognition).  Work and work Statistician.  Pregnancy and menstrual history. What immunizations do I need?  Influenza (flu) vaccine  This is recommended every year. Tetanus, diphtheria, and pertussis (Tdap) vaccine  You may need a Td booster every 10 years. Varicella (chickenpox) vaccine  You may need this vaccine if you have not already been vaccinated. Zoster (shingles) vaccine  You may need this after age 30. Pneumococcal conjugate (PCV13) vaccine  One dose is recommended after age 101. Pneumococcal polysaccharide (PPSV23) vaccine  One dose is recommended after age 47. Measles, mumps, and rubella (MMR) vaccine  You may need at least one dose of MMR if you were born in 1957 or later. You may also need a second  dose. Meningococcal conjugate (MenACWY) vaccine  You may need this if you have certain conditions. Hepatitis A vaccine  You may need this if you have certain conditions or if you travel or work in places where you may be exposed to hepatitis A. Hepatitis B vaccine  You may need this if you have certain conditions or if you travel or work in places where you may be exposed to hepatitis B. Haemophilus influenzae type b (Hib) vaccine  You may need this if you have certain conditions. You may receive vaccines as individual doses or as more than one vaccine together in one shot (combination vaccines). Talk with your health care provider about the risks and benefits of combination vaccines. What tests do I need? Blood tests  Lipid and cholesterol levels. These may be checked every 5 years, or more frequently depending on your overall health.  Hepatitis C test.  Hepatitis B test. Screening  Lung cancer screening. You may have this screening every year starting at age 63 if you have a 30-pack-year history of smoking and currently smoke or have quit within the past 15 years.  Colorectal cancer screening. All adults should have this screening starting at age 40 and continuing until age 4. Your health care provider may recommend screening at age 67 if you are at increased risk. You will have tests every 1-10 years, depending on your results and the type of screening test.  Diabetes screening. This is done by checking your blood sugar (glucose) after you have not eaten for a while (fasting). You may have this done every 1-3 years.  Mammogram. This  be done every 1-2 years. Talk with your health care provider about how often you should have regular mammograms.  BRCA-related cancer screening. This may be done if you have a family history of breast, ovarian, tubal, or peritoneal cancers. Other tests  Sexually transmitted disease (STD) testing.  Bone density scan. This is done to screen for  osteoporosis. You may have this done starting at age 65. Follow these instructions at home: Eating and drinking  Eat a diet that includes fresh fruits and vegetables, whole grains, lean protein, and low-fat dairy products. Limit your intake of foods with high amounts of sugar, saturated fats, and salt.  Take vitamin and mineral supplements as recommended by your health care provider.  Do not drink alcohol if your health care provider tells you not to drink.  If you drink alcohol: ? Limit how much you have to 0-1 drink a day. ? Be aware of how much alcohol is in your drink. In the [place] one drink equals one 12 oz bottle of beer (355 mL), one 5 oz glass of wine (148 mL), or one 1 oz glass of hard liquor (44 mL). Lifestyle  Take daily care of your teeth and gums.  Stay active. Exercise for at least 30 minutes on 5 or more days each week.  Do not use any products that contain nicotine or tobacco, such as cigarettes, e-cigarettes, and chewing tobacco. If you need help quitting, ask your health care provider.  If you are sexually active, practice safe sex. Use a condom or other form of protection in order to prevent STIs (sexually transmitted infections).  Talk with your health care provider about taking a low-dose aspirin or statin. What's next?  Go to your health care provider once a year for a well check visit.  Ask your health care provider how often you should have your eyes and teeth checked.  Stay up to date on all vaccines. This information is not intended to replace advice given to you by your health care provider. Make sure you discuss any questions you have with your health care provider. Document Revised: 04/10/2018 Document Reviewed: 04/10/2018 Elsevier Patient Education  2020 [employer]  

## 2019-07-06 ENCOUNTER — Encounter: Payer: Self-pay | Admitting: Family Medicine

## 2019-07-08 ENCOUNTER — Telehealth: Payer: Self-pay

## 2019-07-08 NOTE — Telephone Encounter (Signed)
Copied from [place] [id number]. Topic: General - Call Back - No Documentation >> Jul 08, 2019 12:46 PM Erick Blinks wrote: Best contact: [phone] pt is requesting a call back to confirm whether the office has received her records that were requested from her previous primary care office

## 2019-07-08 NOTE — Telephone Encounter (Signed)
Patient advised as below.  

## 2019-07-08 NOTE — Telephone Encounter (Signed)
Have you received patient's records from previous PCP?

## 2019-07-08 NOTE — Telephone Encounter (Signed)
Just got them today. Not reviewed yet, but I will

## 2019-07-15 ENCOUNTER — Other Ambulatory Visit: Payer: Self-pay | Admitting: Family Medicine

## 2019-07-15 NOTE — Telephone Encounter (Signed)
Patient has appointment 3/23- refilled per protocol

## 2019-07-16 ENCOUNTER — Encounter: Payer: Self-pay | Admitting: Family Medicine

## 2019-07-21 ENCOUNTER — Ambulatory Visit
Admission: RE | Admit: 2019-07-21 | Discharge: 2019-07-21 | Disposition: A | Discharge status: Home / Self Care | Payer: Medicare Other | Source: Ambulatory Visit | Attending: Pain Medicine | Admitting: Pain Medicine

## 2019-07-21 ENCOUNTER — Ambulatory Visit (HOSPITAL_BASED_OUTPATIENT_CLINIC_OR_DEPARTMENT_OTHER): Payer: Medicare Other | Admitting: Pain Medicine

## 2019-07-21 ENCOUNTER — Other Ambulatory Visit: Payer: Self-pay

## 2019-07-21 ENCOUNTER — Telehealth: Payer: Self-pay

## 2019-07-21 ENCOUNTER — Encounter: Payer: Self-pay | Admitting: Pain Medicine

## 2019-07-21 ENCOUNTER — Other Ambulatory Visit: Payer: Self-pay | Admitting: Family Medicine

## 2019-07-21 VITALS — BP 139/75 | HR 62 | Temp 97.0°F | Resp 13 | Ht 61.5 in | Wt 145.0 lb

## 2019-07-21 DIAGNOSIS — M47816 Spondylosis without myelopathy or radiculopathy, lumbar region: Secondary | ICD-10-CM | POA: Insufficient documentation

## 2019-07-21 DIAGNOSIS — M5137 Other intervertebral disc degeneration, lumbosacral region: Secondary | ICD-10-CM

## 2019-07-21 DIAGNOSIS — G8918 Other acute postprocedural pain: Secondary | ICD-10-CM | POA: Insufficient documentation

## 2019-07-21 DIAGNOSIS — M5442 Lumbago with sciatica, left side: Secondary | ICD-10-CM

## 2019-07-21 DIAGNOSIS — M47817 Spondylosis without myelopathy or radiculopathy, lumbosacral region: Secondary | ICD-10-CM | POA: Insufficient documentation

## 2019-07-21 DIAGNOSIS — G8929 Other chronic pain: Secondary | ICD-10-CM | POA: Insufficient documentation

## 2019-07-21 DIAGNOSIS — M4310 Spondylolisthesis, site unspecified: Secondary | ICD-10-CM | POA: Insufficient documentation

## 2019-07-21 DIAGNOSIS — M5441 Lumbago with sciatica, right side: Secondary | ICD-10-CM | POA: Insufficient documentation

## 2019-07-21 MED ORDER — FENTANYL CITRATE (PF) 100 MCG/2ML IJ SOLN
INTRAMUSCULAR | Status: AC
  Filled 2019-07-21: qty 2

## 2019-07-21 MED ORDER — OXYCODONE-ACETAMINOPHEN 5-325 MG PO TABS: 1.0000 | ORAL_TABLET | Freq: Four times a day (QID) | ORAL | 0 refills | Status: AC | PRN

## 2019-07-21 MED ORDER — MIDAZOLAM HCL 5 MG/5ML IJ SOLN
1.0000 mg | INTRAMUSCULAR | Status: DC | PRN
  Administered 2019-07-21: 0.5 mg via INTRAVENOUS
  Filled 2019-07-21: qty 5

## 2019-07-21 MED ORDER — LACTATED RINGERS IV SOLN
1000.0000 mL | Freq: Once | INTRAVENOUS | Status: AC
  Administered 2019-07-21: 1000 mL via INTRAVENOUS

## 2019-07-21 MED ORDER — FENTANYL CITRATE (PF) 100 MCG/2ML IJ SOLN
25.0000 ug | INTRAMUSCULAR | Status: DC | PRN
  Administered 2019-07-21: 75 ug via INTRAVENOUS

## 2019-07-21 MED ORDER — LIDOCAINE HCL 2 % IJ SOLN
20.0000 mL | Freq: Once | INTRAMUSCULAR | Status: AC
  Administered 2019-07-21: 400 mg
  Filled 2019-07-21: qty 40

## 2019-07-21 MED ORDER — TRIAMCINOLONE ACETONIDE 40 MG/ML IJ SUSP
40.0000 mg | Freq: Once | INTRAMUSCULAR | Status: AC
  Administered 2019-07-21: 40 mg
  Filled 2019-07-21: qty 1

## 2019-07-21 MED ORDER — ROPIVACAINE HCL 2 MG/ML IJ SOLN
9.0000 mL | Freq: Once | INTRAMUSCULAR | Status: AC
  Administered 2019-07-21: 9 mL via PERINEURAL
  Filled 2019-07-21: qty 10

## 2019-07-21 NOTE — Progress Notes (Signed)
PROVIDER NOTE: Information contained herein reflects review and annotations entered in association with encounter. Interpretation of such information and data should be left to medically-trained personnel. Information provided to patient can be located elsewhere in the medical record under "Patient Instructions". Document created using STT-dictation technology, any transcriptional errors that may result from process are unintentional.    Patient: Ashley Rodriguez  Service Category: Procedure  Provider: Gaspar Cola, MD  DOB: [birth year]  DOS: 07/21/2019  Location: [facility]  MRN: [id number]  Setting: Ambulatory - outpatient  Referring Provider: Milinda Pointer, MD  Type: Established Patient  Specialty: Interventional Pain Management  PCP: Virginia Crews, MD   Primary Reason for Visit: Interventional Pain Management Treatment. CC: Back Pain (right and low)  Procedure:          Anesthesia, Analgesia, Anxiolysis:  Type: Thermal Lumbar Facet, Medial Branch Radiofrequency Ablation/Neurotomy  #1  Primary Purpose: Therapeutic Region: Posterolateral Lumbosacral Spine Level: L2, L3, L4, L5, & S1 Medial Branch Level(s). These levels will denervate the L3-4, L4-5, and the L5-S1 lumbar facet joints. Laterality: Right  Type: Moderate (Conscious) Sedation combined with Local Anesthesia Indication(s): Analgesia and Anxiety Route: Intravenous (IV) IV Access: Secured Sedation: Meaningful verbal contact was maintained at all times during the procedure  Local Anesthetic: Lidocaine 1-2%  Position: Prone   Indications: 1. Spondylosis without myelopathy or radiculopathy, lumbosacral region   2. Lumbar facet syndrome (Bilateral) (R>L)   3. DDD (degenerative disc disease), lumbosacral   4. Grade 1 Anterolisthesis of L5 over S1   5. Osteoarthritis of lumbar spine   6. Chronic low back pain (Primary Area of Pain) (Bilateral) (R>L)   7. Acute postoperative pain    Ms. Dunsmoor has  been dealing with the above chronic pain for longer than three months and has either failed to respond, was unable to tolerate, or simply did not get enough benefit from other more conservative therapies including, but not limited to: 1. Over-the-counter medications 2. Anti-inflammatory medications 3. Muscle relaxants 4. Membrane stabilizers 5. Opioids 6. Physical therapy and/or chiropractic manipulation 7. Modalities (Heat, ice, etc.) 8. Invasive techniques such as nerve blocks. Ms. Raygor has attained more than 50% relief of the pain from a series of diagnostic injections conducted in separate occasions.  Pain Score: Pre-procedure: 7 /10 Post-procedure: 0-No pain/10  Pre-op Assessment:  Ms. Seyfried is a 69 y.o. (year old), female patient, seen today for interventional treatment. She  has a past surgical history that includes Abdominal hysterectomy (2000); lipoma removed (04/2012); Colonoscopy (2015); and Neck mass excision (04/28/14). Ms. Lanzillo has a current medication list which includes the following prescription(s): amlodipine, atenolol, atorvastatin, betamethasone dipropionate, calcium carbonate, enbrel sureclick, esomeprazole, fexofenadine, fluticasone, gabapentin, meloxicam, montelukast, multivitamin with minerals, oxycodone-acetaminophen, [START ON 07/28/2019] oxycodone-acetaminophen, turmeric, valacyclovir, acetaminophen, and losartan, and the following Facility-Administered Medications: fentanyl and midazolam. Her primarily concern today is the Back Pain (right and low)  Initial Vital Signs:  Pulse/HCG Rate: 62ECG Heart Rate: (!) 59 Temp: 97.8 F (36.6 C) Resp: 18 BP: (!) 146/73 SpO2: 99 %  BMI: Estimated body mass index is 26.95 kg/m as calculated from the following:   Height as of this encounter: 5' 1.5" (1.562 m).   Weight as of this encounter: 145 lb (65.8 kg).  Risk Assessment: Allergies: Reviewed. She is allergic to codeine and sulfa antibiotics.  Allergy Precautions:  None required Coagulopathies: Reviewed. None identified.  Blood-thinner therapy: None at this time Active Infection(s): Reviewed. None identified. Ms. Marinelli is afebrile  Site Confirmation:  Ms. Landesman was asked to confirm the procedure and laterality before marking the site Procedure checklist: Completed Consent: Before the procedure and under the influence of no sedative(s), amnesic(s), or anxiolytics, the patient was informed of the treatment options, risks and possible complications. To fulfill our ethical and legal obligations, as recommended by the American Medical Association's Code of Ethics, I have informed the patient of my clinical impression; the nature and purpose of the treatment or procedure; the risks, benefits, and possible complications of the intervention; the alternatives, including doing nothing; the risk(s) and benefit(s) of the alternative treatment(s) or procedure(s); and the risk(s) and benefit(s) of doing nothing. The patient was provided information about the general risks and possible complications associated with the procedure. These may include, but are not limited to: failure to achieve desired goals, infection, bleeding, organ or nerve damage, allergic reactions, paralysis, and death. In addition, the patient was informed of those risks and complications associated to Spine-related procedures, such as failure to decrease pain; infection (i.e.: Meningitis, epidural or intraspinal abscess); bleeding (i.e.: epidural hematoma, subarachnoid hemorrhage, or any other type of intraspinal or peri-dural bleeding); organ or nerve damage (i.e.: Any type of peripheral nerve, nerve root, or spinal cord injury) with subsequent damage to sensory, motor, and/or autonomic systems, resulting in permanent pain, numbness, and/or weakness of one or several areas of the body; allergic reactions; (i.e.: anaphylactic reaction); and/or death. Furthermore, the patient was informed of those risks and  complications associated with the medications. These include, but are not limited to: allergic reactions (i.e.: anaphylactic or anaphylactoid reaction(s)); adrenal axis suppression; blood sugar elevation that in diabetics may result in ketoacidosis or comma; water retention that in patients with history of congestive heart failure may result in shortness of breath, pulmonary edema, and decompensation with resultant heart failure; weight gain; swelling or edema; medication-induced neural toxicity; particulate matter embolism and blood vessel occlusion with resultant organ, and/or nervous system infarction; and/or aseptic necrosis of one or more joints. Finally, the patient was informed that Medicine is not an exact science; therefore, there is also the possibility of unforeseen or unpredictable risks and/or possible complications that may result in a catastrophic outcome. The patient indicated having understood very clearly. We have given the patient no guarantees and we have made no promises. Enough time was given to the patient to ask questions, all of which were answered to the patient's satisfaction. Ms. Tsutsui has indicated that she wanted to continue with the procedure. Attestation: I, the ordering provider, attest that I have discussed with the patient the benefits, risks, side-effects, alternatives, likelihood of achieving goals, and potential problems during recovery for the procedure that I have provided informed consent. Date  Time: 07/21/2019 10:29 AM  Pre-Procedure Preparation:  Monitoring: As per clinic protocol. Respiration, ETCO2, SpO2, BP, heart rate and rhythm monitor placed and checked for adequate function Safety Precautions: Patient was assessed for positional comfort and pressure points before starting the procedure. Time-out: I initiated and conducted the "Time-out" before starting the procedure, as per protocol. The patient was asked to participate by confirming the accuracy of the  "Time Out" information. Verification of the correct person, site, and procedure were performed and confirmed by me, the nursing staff, and the patient. "Time-out" conducted as per Joint Commission's Universal Protocol (UP.01.01.01). Time: 1140  Description of Procedure:          Laterality: Right Levels:  L2, L3, L4, L5, & S1 Medial Branch Level(s), at the L3-4, L4-5, and the L5-S1 lumbar facet  joints. Area Prepped: Lumbosacral Prepping solution: DuraPrep (Iodine Povacrylex [0.7% available iodine] and Isopropyl Alcohol, 74% w/w) Safety Precautions: Aspiration looking for blood return was conducted prior to all injections. At no point did we inject any substances, as a needle was being advanced. Before injecting, the patient was told to immediately notify me if she was experiencing any new onset of "ringing in the ears, or metallic taste in the mouth". No attempts were made at seeking any paresthesias. Safe injection practices and needle disposal techniques used. Medications properly checked for expiration dates. SDV (single dose vial) medications used. After the completion of the procedure, all disposable equipment used was discarded in the proper designated medical waste containers. Local Anesthesia: Protocol guidelines were followed. The patient was positioned over the fluoroscopy table. The area was prepped in the usual manner. The time-out was completed. The target area was identified using fluoroscopy. A 12-in long, straight, sterile hemostat was used with fluoroscopic guidance to locate the targets for each level blocked. Once located, the skin was marked with an approved surgical skin marker. Once all sites were marked, the skin (epidermis, dermis, and hypodermis), as well as deeper tissues (fat, connective tissue and muscle) were infiltrated with a small amount of a short-acting local anesthetic, loaded on a 10cc syringe with a 25G, 1.5-in  Needle. An appropriate amount of time was allowed for local  anesthetics to take effect before proceeding to the next step. Local Anesthetic: Lidocaine 2.0% The unused portion of the local anesthetic was discarded in the proper designated containers. Technical explanation of process:  Radiofrequency Ablation (RFA) L2 Medial Branch Nerve RFA: The target area for the L2 medial branch is at the junction of the postero-lateral aspect of the superior articular process and the superior, posterior, and medial edge of the transverse process of L3. Under fluoroscopic guidance, a Radiofrequency needle was inserted until contact was made with os over the superior postero-lateral aspect of the pedicular shadow (target area). Sensory and motor testing was conducted to properly adjust the position of the needle. Once satisfactory placement of the needle was achieved, the numbing solution was slowly injected after negative aspiration for blood. 2.0 mL of the nerve block solution was injected without difficulty or complication. After waiting for at least 3 minutes, the ablation was performed. Once completed, the needle was removed intact. L3 Medial Branch Nerve RFA: The target area for the L3 medial branch is at the junction of the postero-lateral aspect of the superior articular process and the superior, posterior, and medial edge of the transverse process of L4. Under fluoroscopic guidance, a Radiofrequency needle was inserted until contact was made with os over the superior postero-lateral aspect of the pedicular shadow (target area). Sensory and motor testing was conducted to properly adjust the position of the needle. Once satisfactory placement of the needle was achieved, the numbing solution was slowly injected after negative aspiration for blood. 2.0 mL of the nerve block solution was injected without difficulty or complication. After waiting for at least 3 minutes, the ablation was performed. Once completed, the needle was removed intact. L4 Medial Branch Nerve RFA: The target  area for the L4 medial branch is at the junction of the postero-lateral aspect of the superior articular process and the superior, posterior, and medial edge of the transverse process of L5. Under fluoroscopic guidance, a Radiofrequency needle was inserted until contact was made with os over the superior postero-lateral aspect of the pedicular shadow (target area). Sensory and motor testing was conducted to  properly adjust the position of the needle. Once satisfactory placement of the needle was achieved, the numbing solution was slowly injected after negative aspiration for blood. 2.0 mL of the nerve block solution was injected without difficulty or complication. After waiting for at least 3 minutes, the ablation was performed. Once completed, the needle was removed intact. L5 Medial Branch Nerve RFA: The target area for the L5 medial branch is at the junction of the postero-lateral aspect of the superior articular process of S1 and the superior, posterior, and medial edge of the sacral ala. Under fluoroscopic guidance, a Radiofrequency needle was inserted until contact was made with os over the superior postero-lateral aspect of the pedicular shadow (target area). Sensory and motor testing was conducted to properly adjust the position of the needle. Once satisfactory placement of the needle was achieved, the numbing solution was slowly injected after negative aspiration for blood. 2.0 mL of the nerve block solution was injected without difficulty or complication. After waiting for at least 3 minutes, the ablation was performed. Once completed, the needle was removed intact. S1 Medial Branch Nerve RFA: The target area for the S1 medial branch is located inferior to the junction of the S1 superior articular process and the L5 inferior articular process, posterior, inferior, and lateral to the 6 o'clock position of the L5-S1 facet joint, just superior to the S1 posterior foramen. Under fluoroscopic guidance, the  Radiofrequency needle was advanced until contact was made with os over the Target area. Sensory and motor testing was conducted to properly adjust the position of the needle. Once satisfactory placement of the needle was achieved, the numbing solution was slowly injected after negative aspiration for blood. 2.0 mL of the nerve block solution was injected without difficulty or complication. After waiting for at least 3 minutes, the ablation was performed. Once completed, the needle was removed intact. Radiofrequency lesioning (ablation):  Radiofrequency Generator: NeuroTherm [id number] Sensory Stimulation Parameters: 50 Hz was used to locate & identify the nerve, making sure that the needle was positioned such that there was no sensory stimulation below 0.3 V or above 0.7 V. Motor Stimulation Parameters: 2 Hz was used to evaluate the motor component. Care was taken not to lesion any nerves that demonstrated motor stimulation of the lower extremities at an output of less than 2.5 times that of the sensory threshold, or a maximum of 2.0 V. Lesioning Technique Parameters: Standard Radiofrequency settings. (Not bipolar or pulsed.) Temperature Settings: 80 degrees C Lesioning time: 60 seconds Intra-operative Compliance: Compliant Materials & Medications: Needle(s) (Electrode/Cannula) Type: Teflon-coated, curved tip, Radiofrequency needle(s) Gauge: 22G Length: 10cm Numbing solution: 0.2% PF-Ropivacaine + Triamcinolone (40 mg/mL) diluted to a final concentration of 4 mg of Triamcinolone/mL of Ropivacaine The unused portion of the solution was discarded in the proper designated containers.  Once the entire procedure was completed, the treated area was cleaned, making sure to leave some of the prepping solution back to take advantage of its long term bactericidal properties.  Illustration of the posterior view of the lumbar spine and the posterior neural structures. Laminae of L2 through S1 are labeled. DPRL5,  dorsal primary ramus of L5; DPRS1, dorsal primary ramus of S1; DPR3, dorsal primary ramus of L3; FJ, facet (zygapophyseal) joint L3-L4; I, inferior articular process of L4; LB1, lateral branch of dorsal primary ramus of L1; IAB, inferior articular branches from L3 medial branch (supplies L4-L5 facet joint); IBP, intermediate branch plexus; MB3, medial branch of dorsal primary ramus of L3; NR3, third lumbar nerve  root; S, superior articular process of L5; SAB, superior articular branches from L4 (supplies L4-5 facet joint also); TP3, transverse process of L3.  Vitals:   07/21/19 1208 07/21/19 1213 07/21/19 1223 07/21/19 1233  BP: 113/65 (!) 145/88 138/70 139/75  Pulse:      Resp: 12 12 13 13   Temp:  (!) 97 F (36.1 C)    TempSrc:      SpO2: 93% 97% 100% 100%  Weight:      Height:       Start Time: 1140 hrs. End Time: 1205 hrs.  Imaging Guidance (Spinal):          Type of Imaging Technique: Fluoroscopy Guidance (Spinal) Indication(s): Assistance in needle guidance and placement for procedures requiring needle placement in or near specific anatomical locations not easily accessible without such assistance. Exposure Time: Please see nurses notes. Contrast: None used. Fluoroscopic Guidance: I was personally present during the use of fluoroscopy. "Tunnel Vision Technique" used to obtain the best possible view of the target area. Parallax error corrected before commencing the procedure. "Direction-depth-direction" technique used to introduce the needle under continuous pulsed fluoroscopy. Once target was reached, antero-posterior, oblique, and lateral fluoroscopic projection used confirm needle placement in all planes. Images permanently stored in EMR. Interpretation: No contrast injected. I personally interpreted the imaging intraoperatively. Adequate needle placement confirmed in multiple planes. Permanent images saved into the patient's record.  Antibiotic Prophylaxis:   Anti-infectives (From  admission, onward)   None     Indication(s): None identified  Post-operative Assessment:  Post-procedure Vital Signs:  Pulse/HCG Rate: 62(!) 53 Temp: (!) 97 F (36.1 C) Resp: 13 BP: 139/75 SpO2: 100 %  EBL: None  Complications: No immediate post-treatment complications observed by team, or reported by patient.  Note: The patient tolerated the entire procedure well. A repeat set of vitals were taken after the procedure and the patient was kept under observation following institutional policy, for this type of procedure. Post-procedural neurological assessment was performed, showing return to baseline, prior to discharge. The patient was provided with post-procedure discharge instructions, including a section on how to identify potential problems. Should any problems arise concerning this procedure, the patient was given instructions to immediately contact us, at any time, without hesitation. In any case, we plan to contact the patient by telephone for a follow-up status report regarding this interventional procedure.  Comments:  No additional relevant information.  Plan of Care  Orders:  Orders Placed This Encounter  Procedures  . Radiofrequency,Lumbar    Scheduling Instructions:     Side(s): Right-sided     Level: L3-4, L4-5, & L5-S1 Facets (L2, L3, L4, L5, & S1 Medial Branch Nerves)     Sedation: With Sedation     Timeframe: Today    Order Specific Question:   Where will this procedure be performed?    Answer:   [facility]  . Radiofrequency,Lumbar    Standing Status:   Future    Standing Expiration Date:   01/20/2021    Scheduling Instructions:     Side(s): Left-sided     Level: L3-4, L4-5, & L5-S1 Facets (L2, L3, L4, L5, & S1 Medial Branch Nerves)     Sedation: With Sedation     Scheduling Timeframe: 2 weeks from now    Order Specific Question:   Where will this procedure be performed?    Answer:   [facility]  . [facility] C-ARM 1-60 MIN NO REPORT     Intraoperative interpretation  by procedural physician at [place].    Standing Status:   Standing    Number of Occurrences:   1    Order Specific Question:   Reason for exam:    Answer:   Assistance in needle guidance and placement for procedures requiring needle placement in or near specific anatomical locations not easily accessible without such assistance.  . Informed Consent Details: Physician/Practitioner Attestation; Transcribe to consent form and obtain patient signature    Nursing Order: Transcribe to consent form and obtain patient signature. Note: Always confirm laterality of pain with Ms. Lilia Pro, before procedure. Procedure: Lumbar Facet Radiofrequency Ablation Indication/Reason: Low Back Pain, with our without leg pain, due to Facet Joint Arthralgia (Joint Pain) known as Lumbar Facet Syndrome, secondary to Lumbar, and/or Lumbosacral Spondylosis (Arthritis of the Spine), without myelopathy or radiculopathy (Nerve Damage). Provider Attestation: I, Pierre Dossie Arbour, MD, (Pain Management Specialist), the physician/practitioner, attest that I have discussed with the patient the benefits, risks, side effects, alternatives, likelihood of achieving goals and potential problems during recovery for the procedure that I have provided informed consent.  . Provide equipment / supplies at bedside    Equipment required: Sterile "Radiofrequency Tray"; Large hemostat (1); Small hemostat (1); Towels (6-8); 4x4 sterile sponge pack (1) Radiofrequency Needle(s): Size: Long Quantity: 5    Standing Status:   Standing    Number of Occurrences:   1    Order Specific Question:   Specify    Answer:   Radiofrequency Tray   Chronic Opioid Analgesic:  No opioid analgesics from our practice.   Medications ordered for procedure: Meds ordered this encounter  Medications  . lidocaine (XYLOCAINE) 2 % (with pres) injection 400 mg  . lactated ringers infusion 1,000 mL  . midazolam (VERSED) 5  MG/5ML injection 1-2 mg    Make sure Flumazenil is available in the pyxis when using this medication. If oversedation occurs, administer 0.2 mg IV over 15 sec. If after 45 sec no response, administer 0.2 mg again over 1 min; may repeat at 1 min intervals; not to exceed 4 doses (1 mg)  . ropivacaine (PF) 2 mg/mL (0.2%) (NAROPIN) injection 9 mL  . triamcinolone acetonide (KENALOG-40) injection 40 mg  . oxyCODONE-acetaminophen (PERCOCET) 5-325 MG tablet    Sig: Take 1 tablet by mouth every 6 (six) hours as needed for up to 7 days for severe pain. Must last 7 days.    Dispense:  28 tablet    Refill:  0    For acute post-operative pain. Not to be refilled. Most last 7 days.  Marland Kitchen oxyCODONE-acetaminophen (PERCOCET) 5-325 MG tablet    Sig: Take 1 tablet by mouth every 6 (six) hours as needed for up to 7 days for severe pain. Must last 7 days.    Dispense:  28 tablet    Refill:  0    For acute post-operative pain. Not to be refilled. Must last 7 days.  . fentaNYL (SUBLIMAZE) injection 25-50 mcg    Make sure Narcan is available in the pyxis when using this medication. In the event of respiratory depression (RR< 8/min): Titrate NARCAN (naloxone) in increments of 0.1 to 0.2 mg IV at 2-3 minute intervals, until desired degree of reversal.   Medications administered: We administered lidocaine, lactated ringers, midazolam, ropivacaine (PF) 2 mg/mL (0.2%), triamcinolone acetonide, and fentaNYL.  See the medical record for exact dosing, route, and time of administration.  Follow-up plan:   Return in about 2 weeks (around 08/04/2019) for RFA (  w/ sedation):(L) L-FCT RFA #1.       Interventional management options: Planned, scheduled, and/or pending:   None at this time.    Considering:   Possible bilateral lumbar facet RFA Diagnostic bilateral SI joint block Possible bilateral SI joint RFA Diagnostic bilateral hip joint injection Possible bilateral femoral +obturator NB Possible bilateral femoral +  obturator nerve RFA Diagnostic right L4-5 LESI #2   Palliative PRN treatment(s):   Palliativebilateral lumbar facet block#6(diagnostic lumbar facet blocks provide the patient with 90 to 100% relief of the pain, unfortunately they did not last.  The insurance company has denied radiofrequency ablation for some unknown reason.)      Recent Visits No visits were found meeting these conditions.  Showing recent visits within past 90 days and meeting all other requirements   Today's Visits Date Type Provider Dept  07/21/19 Procedure visit Milinda Pointer, MD [facility]  Showing today's visits and meeting all other requirements   Future Appointments No visits were found meeting these conditions.  Showing future appointments within next 90 days and meeting all other requirements   Disposition: Discharge home  Discharge (Date  Time): 07/21/2019; 1235 hrs.   Primary Care Physician: Virginia Crews, MD Location: [facility] Note by: Gaspar Cola, MD Date: 07/21/2019; Time: 3:21 PM  Disclaimer:  Medicine is not an Chief Strategy Officer. The only guarantee in medicine is that nothing is guaranteed. It is important to note that the decision to proceed with this intervention was based on the information collected from the patient. The Data and conclusions were drawn from the patient's questionnaire, the interview, and the physical examination. Because the information was provided in large part by the patient, it cannot be guaranteed that it has not been purposely or unconsciously manipulated. Every effort has been made to obtain as much relevant data as possible for this evaluation. It is important to note that the conclusions that lead to this procedure are derived in large part from the available data. Always take into account that the treatment will also be dependent on availability of resources and existing treatment guidelines, considered by other  Pain Management Practitioners as being common knowledge and practice, at the time of the intervention. For Medico-Legal purposes, it is also important to point out that variation in procedural techniques and pharmacological choices are the acceptable norm. The indications, contraindications, technique, and results of the above procedure should only be interpreted and judged by a Board-Certified Interventional Pain Specialist with extensive familiarity and expertise in the same exact procedure and technique.

## 2019-07-21 NOTE — Progress Notes (Signed)
Safety precautions to be maintained throughout the outpatient stay will include: orient to surroundings, keep bed in low position, maintain call bell within reach at all times, provide assistance with transfer out of bed and ambulation.  

## 2019-07-21 NOTE — Patient Instructions (Addendum)
___________________________________________________________________________________________  Post-Radiofrequency (RF) Discharge Instructions  You have just completed a Radiofrequency Neurotomy.  The following instructions will provide you with information and guidelines for self-care upon discharge.  If at any time you have questions or concerns please call your physician. DO NOT DRIVE YOURSELF!!  Instructions:  Apply ice: Fill a plastic sandwich bag with crushed ice. Cover it with a small towel and apply to injection site. Apply for 15 minutes then remove x 15 minutes. Repeat sequence on day of procedure, until you go to bed. The purpose is to minimize swelling and discomfort after procedure.  Apply heat: Apply heat to procedure site starting the day following the procedure. The purpose is to treat any soreness and discomfort from the procedure.  Food intake: No eating limitations, unless stipulated above.  Nevertheless, if you have had sedation, you may experience some nausea.  In this case, it may be wise to wait at least two hours prior to resuming regular diet.  Physical activities: Keep activities to a minimum for the first 8 hours after the procedure. For the first 24 hours after the procedure, do not drive a motor vehicle,  Operate heavy machinery, power tools, or handle any weapons.  Consider walking with the use of an assistive device or accompanied by an adult for the first 24 hours.  Do not drink alcoholic beverages including beer.  Do not make any important decisions or sign any legal documents. Go home and rest today.  Resume activities tomorrow, as tolerated.  Use caution in moving about as you may experience mild leg weakness.  Use caution in cooking, use of household electrical appliances and climbing steps.  Driving: If you have received any sedation, you are not allowed to drive for 24 hours after your procedure.  Blood thinner: Restart your blood thinner 6 hours after your  procedure. (Only for those taking blood thinners)  Insulin: As soon as you can eat, you may resume your normal dosing schedule. (Only for those taking insulin)  Medications: May resume pre-procedure medications.  Do not take any drugs, other than what has been prescribed to you.  Infection prevention: Keep procedure site clean and dry.  Post-procedure Pain Diary: Extremely important that this be done correctly and accurately. Recorded information will be used to determine the next step in treatment.  Pain evaluated is that of treated area only. Do not include pain from an untreated area.  Complete every hour, on the hour, for the initial 8 hours. Set an alarm to help you do this part accurately.  Do not go to sleep and have it completed later. It will not be accurate.  Follow-up appointment: Keep your follow-up appointment after the procedure. Usually 2-6 weeks after radiofrequency. Bring you pain diary. The information collected will be essential for your long-term care.   Expect:  From numbing medicine (AKA: Local Anesthetics): Numbness or decrease in pain.  Onset: Full effect within 15 minutes of injected.  Duration: It will depend on the type of local anesthetic used. On the average, 1 to 8 hours.   From steroids (when added): Decrease in swelling or inflammation. Once inflammation is improved, relief of the pain will follow.  Onset of benefits: Depends on the amount of swelling present. The more swelling, the longer it will take for the benefits to be seen. In some cases, up to 10 days.  Duration: Steroids will stay in the system x 2 weeks. Duration of benefits will depend on multiple posibilities including persistent irritating factors.    From procedure: Some discomfort is to be expected once the numbing medicine wears off. In the case of radiofrequency procedures, this may last as long as 6 weeks. Additional post-procedure pain medication is provided for this. Discomfort is  minimized if ice and heat are applied as instructed.  Call if:  You experience numbness and weakness that gets worse with time, as opposed to wearing off.  He experience any unusual bleeding, difficulty breathing, or loss of the ability to control your bowel and bladder. (This applies to Spinal procedures only)  You experience any redness, swelling, heat, red streaks, elevated temperature, fever, or any other signs of a possible infection.  Emergency Numbers:  [place] hours (Monday - Thursday, 8:00 AM - 4:00 PM) (Friday, 9:00 AM - 12:00 Noon): [phone]  After hours: [phone] ____________________________________________________________________________________________   Preparing for your procedure (without sedation) Instructions: . Oral Intake: Do not eat or drink anything for at least 3 hours prior to your procedure. . Transportation: Unless otherwise stated by your physician, you may drive yourself after the procedure. . Blood Pressure Medicine: Take your blood pressure medicine with a sip of water the morning of the procedure. . Insulin: Take only  of your normal insulin dose. . Preventing infections: Shower with an antibacterial soap the morning of your procedure. . Build-up your immune system: Take 1000 mg of Vitamin C with every meal (3 times a day) the day prior to your procedure. . Pregnancy: If you are pregnant, call and cancel the procedure. . Sickness: If you have a cold, fever, or any active infections, call and cancel the procedure. . Arrival: You must be in the facility at least 30 minutes prior to your scheduled procedure. . Children: Do not bring any children with you. . Dress appropriately: Bring dark clothing that you would not mind if they get stained. . Valuables: Do not bring any jewelry or valuables. Procedure appointments are reserved for interventional treatments only. Marland Kitchen No Prescription Refills. . No medication changes will be discussed  during procedure appointments. . No disability issues will be discussed. Marland Kitchen

## 2019-07-22 ENCOUNTER — Telehealth: Payer: Self-pay

## 2019-07-22 NOTE — Telephone Encounter (Signed)
Denies any needs at this time. Patient asked about authorization on her next procedure.

## 2019-07-24 ENCOUNTER — Telehealth: Payer: Self-pay | Admitting: *Deleted

## 2019-07-30 ENCOUNTER — Ambulatory Visit (INDEPENDENT_AMBULATORY_CARE_PROVIDER_SITE_OTHER): Payer: Medicare Other | Admitting: Family Medicine

## 2019-07-30 ENCOUNTER — Encounter: Payer: Self-pay | Admitting: Family Medicine

## 2019-07-30 ENCOUNTER — Other Ambulatory Visit: Payer: Self-pay

## 2019-07-30 VITALS — BP 134/72 | HR 64 | Temp 96.9°F | Resp 16 | Ht 62.0 in | Wt 143.0 lb

## 2019-07-30 DIAGNOSIS — I152 Hypertension secondary to endocrine disorders: Secondary | ICD-10-CM

## 2019-07-30 DIAGNOSIS — Z79899 Other long term (current) drug therapy: Secondary | ICD-10-CM

## 2019-07-30 DIAGNOSIS — L4050 Arthropathic psoriasis, unspecified: Secondary | ICD-10-CM

## 2019-07-30 DIAGNOSIS — E785 Hyperlipidemia, unspecified: Secondary | ICD-10-CM

## 2019-07-30 DIAGNOSIS — E1159 Type 2 diabetes mellitus with other circulatory complications: Secondary | ICD-10-CM | POA: Diagnosis not present

## 2019-07-30 DIAGNOSIS — E1169 Type 2 diabetes mellitus with other specified complication: Secondary | ICD-10-CM | POA: Diagnosis not present

## 2019-07-30 DIAGNOSIS — I10 Essential (primary) hypertension: Secondary | ICD-10-CM

## 2019-07-30 LAB — POCT GLYCOSYLATED HEMOGLOBIN (HGB A1C)
Est. average glucose Bld gHb Est-mCnc: 123
Hemoglobin A1C: 5.9 % — AB (ref 4.0–5.6)

## 2019-07-30 NOTE — Assessment & Plan Note (Signed)
Continue statin Recheck FLP and CMP Goal LDL <70 

## 2019-07-30 NOTE — Assessment & Plan Note (Signed)
Well controlled Continue current medications Recheck metabolic panel F/u in 6 months  

## 2019-07-30 NOTE — Assessment & Plan Note (Signed)
Well contorlled No meds -diet controlled Was previously driven by prednisone as well Associated with HTN and HLD UTD on screenings and vaccines On ARB and statin F/u in 6 months

## 2019-07-30 NOTE — Assessment & Plan Note (Signed)
Followed by Rheum Continue Enbrel Off of chronic prednisone Check labs today

## 2019-07-30 NOTE — Progress Notes (Signed)
Patient: Ashley Rodriguez Female    DOB: 12-04-1950   69 y.o.   MRN: [id number] Visit Date: 07/30/2019  Today's Provider: Lavon Paganini, MD   Chief Complaint  Patient presents with  . Hypertension  . Diabetes  . Hyperlipidemia   Subjective:     HPI  Diabetes Mellitus Type II, Follow-up:   Lab Results  Component Value Date   HGBA1C 5.9 (A) 07/30/2019   HGBA1C 6.4 (H) 01/01/2019   HGBA1C 6.5 03/13/2018   HGBA1C 6.5 03/13/2018   Last seen for diabetes 6 months ago.  Management since then includes no changes. Current symptoms include none and have been stable. Home blood sugar records: fasting range: not being checked  Episodes of hypoglycemia? no   Most Recent Eye Exam: 03/2019 Weight trend: stable Prior visit with dietician: no Current diet: in general, a "healthy" diet   Current exercise: bicycling and walking  ------------------------------------------------------------------------    Hypertension, follow-up:  BP Readings from Last 3 Encounters:  07/30/19 134/72  07/21/19 139/75  07/02/19 132/83    She was last seen for hypertension 6 months ago.  BP at that visit was 181/99. Management since that visit includes no changes.She reports excellent compliance with treatment. She is not having side effects.  She is exercising. She is adherent to low salt diet.   Outside blood pressures are stable. She is experiencing none.  Patient denies chest pain, irregular heart beat and lower extremity edema.   Cardiovascular risk factors include diabetes mellitus, dyslipidemia and hypertension.  Use of agents associated with hypertension: none.   ------------------------------------------------------------------------   Lipid/Cholesterol, Follow-up:   Last seen for this 6 months ago.  Management since that visit includes no changes continue current medication.  Last Lipid Panel:    Component Value Date/Time   CHOL 152 01/01/2019 0910   TRIG 95 01/01/2019  0910   HDL 47 01/01/2019 0910   CHOLHDL 3.2 01/01/2019 0910   LDLCALC 87 01/01/2019 0910    She reports excellent compliance with treatment. She is not having side effects.   Wt Readings from Last 3 Encounters:  07/30/19 143 lb (64.9 kg)  07/21/19 145 lb (65.8 kg)  07/02/19 147 lb (66.7 kg)    ------------------------------------------------------------------------    Allergies  Allergen Reactions  . Codeine Rash  . Sulfa Antibiotics Rash     Current Outpatient Medications:  .  acetaminophen (TYLENOL) 325 MG tablet, Take 325 mg by mouth every 6 (six) hours as needed., Disp: , Rfl:  .  amLODipine (NORVASC) 5 MG tablet, TAKE 1 TABLET BY MOUTH ONCE A DAY AS NEEDED FOR BLOOD PRESSURE, Disp: 90 tablet, Rfl: 0 .  atenolol (TENORMIN) 50 MG tablet, TAKE 1 TABLET BY MOUTH ONCE DAILY, Disp: 90 tablet, Rfl: 0 .  atorvastatin (LIPITOR) 20 MG tablet, TAKE 1 TABLET BY MOUTH ONCE DAILY, Disp: 90 tablet, Rfl: 1 .  betamethasone dipropionate 0.05 % lotion, Apply 1 application topically as needed., Disp: , Rfl:  .  calcium carbonate (CALCIUM 600) 600 MG TABS tablet, Take 600 mg by mouth daily with breakfast., Disp: , Rfl:  .  ENBREL SURECLICK 50 MG/ML injection, Inject 50 mg into the skin once a week. , Disp: , Rfl: 4 .  esomeprazole (NEXIUM) 40 MG capsule, Take 1 capsule (40 mg total) by mouth daily., Disp: 90 capsule, Rfl: 3 .  fexofenadine (ALLEGRA) 180 MG tablet, Take 180 mg by mouth daily., Disp: , Rfl:  .  fluticasone (FLONASE) 50 MCG/ACT nasal spray, , Disp: ,  Rfl: 4 .  gabapentin (NEURONTIN) 300 MG capsule, Take 1 capsule (300 mg total) by mouth 3 (three) times daily., Disp: 270 capsule, Rfl: 3 .  losartan (COZAAR) 25 MG tablet, TAKE 1/2 TABLET (12.5 MG TOTAL) BY MOUTHONCE DAILY, Disp: 15 tablet, Rfl: 3 .  meloxicam (MOBIC) 15 MG tablet, Take 15 mg by mouth daily. , Disp: , Rfl: 2 .  montelukast (SINGULAIR) 10 MG tablet, Take 1 tablet (10 mg total) by mouth at bedtime., Disp: 90 tablet,  Rfl: 3 .  Multiple Vitamins-Minerals (MULTIVITAMIN WITH MINERALS) tablet, Take 1 tablet by mouth daily., Disp: , Rfl:  .  TURMERIC PO, Take by mouth., Disp: , Rfl:  .  valACYclovir (VALTREX) 500 MG tablet, Take 500 mg by mouth as needed., Disp: , Rfl:  .  oxyCODONE-acetaminophen (PERCOCET) 5-325 MG tablet, Take 1 tablet by mouth every 6 (six) hours as needed for up to 7 days for severe pain. Must last 7 days. (Patient not taking: Reported on 07/30/2019), Disp: 28 tablet, Rfl: 0  Review of Systems  Constitutional: Negative.   Eyes: Negative.   Respiratory: Negative.   Cardiovascular: Negative.   Endocrine: Negative.     Social History   Tobacco Use  . Smoking status: Never Smoker  . Smokeless tobacco: Never Used  Substance Use Topics  . Alcohol use: Yes    Alcohol/week: 4.0 standard drinks    Types: 4 Standard drinks or equivalent per week      Objective:   BP 134/72 (BP Location: Left Arm, Patient Position: Sitting, Cuff Size: Normal)   Pulse 64   Temp (!) 96.9 F (36.1 C) (Temporal)   Resp 16   Ht 5\' 2"  (1.575 m)   Wt 143 lb (64.9 kg)   BMI 26.16 kg/m  Vitals:   07/30/19 0955  BP: 134/72  Pulse: 64  Resp: 16  Temp: (!) 96.9 F (36.1 C)  TempSrc: Temporal  Weight: 143 lb (64.9 kg)  Height: 5\' 2"  (1.575 m)  Body mass index is 26.16 kg/m.   Physical Exam Vitals reviewed.  Constitutional:      General: She is not in acute distress.    Appearance: Normal appearance. She is well-developed. She is not diaphoretic.  HENT:     Head: Normocephalic and atraumatic.  Eyes:     General: No scleral icterus.    Conjunctiva/sclera: Conjunctivae normal.  Neck:     Thyroid: No thyromegaly.  Cardiovascular:     Rate and Rhythm: Normal rate and regular rhythm.     Pulses: Normal pulses.     Heart sounds: Normal heart sounds. No murmur.  Pulmonary:     Effort: Pulmonary effort is normal. No respiratory distress.     Breath sounds: Normal breath sounds. No wheezing,  rhonchi or rales.  Abdominal:     General: There is no distension.     Palpations: Abdomen is soft.     Tenderness: There is no abdominal tenderness.  Musculoskeletal:     Cervical back: Neck supple.     Right lower leg: No edema.     Left lower leg: No edema.  Lymphadenopathy:     Cervical: No cervical adenopathy.  Skin:    General: Skin is warm and dry.     Findings: No rash.  Neurological:     Mental Status: She is alert and oriented to person, place, and time. Mental status is at baseline.  Psychiatric:        Mood and Affect: Mood normal.  Behavior: Behavior normal.     Results for orders placed or performed in visit on 07/30/19  HM DEXA SCAN  Result Value Ref Range   HM Dexa Scan osteopenia   POCT glycosylated hemoglobin (Hb A1C)  Result Value Ref Range   Hemoglobin A1C 5.9 (A) 4.0 - 5.6 %   Est. average glucose Bld gHb Est-mCnc 123        Assessment & Plan    Problem List Items Addressed This Visit      Cardiovascular and Mediastinum   Hypertension associated with diabetes ([place])    Well controlled Continue current medications Recheck metabolic panel F/u in 6 months       Relevant Orders   Comprehensive metabolic panel     Endocrine   T2DM (type 2 diabetes mellitus) ([place]) - Primary    Well contorlled No meds -diet controlled Was previously driven by prednisone as well Associated with HTN and HLD UTD on screenings and vaccines On ARB and statin F/u in 6 months      Relevant Orders   POCT glycosylated hemoglobin (Hb A1C) (Completed)   Hyperlipidemia associated with type 2 diabetes mellitus ([facility])    Continue statin  Recheck FLP and CMP Goal LDL <70      Relevant Orders   Comprehensive metabolic panel   Lipid panel     Musculoskeletal and Integument   Psoriatic arthritis ([facility])    Followed by Rheum Continue Enbrel Off of chronic prednisone Check labs today      Relevant Orders   Comprehensive metabolic panel   CBC w/Diff/Platelet     Other Visit Diagnoses    Long-term use of high-risk medication       Relevant Orders   CBC w/Diff/Platelet       Return in about 6 months (around 01/29/2020) for chronic disease f/u.   The entirety of the information documented in the History of Present Illness, Review of Systems and Physical Exam were personally obtained by me. Portions of this information were initially documented by Lynford Humphrey, CMA and reviewed by me for thoroughness and accuracy.    Marlissa Emerick, Dionne Bucy, MD MPH [facility]

## 2019-07-31 ENCOUNTER — Telehealth: Payer: Self-pay

## 2019-07-31 LAB — COMPREHENSIVE METABOLIC PANEL
ALT: 22 IU/L (ref 0–32)
AST: 17 IU/L (ref 0–40)
Albumin/Globulin Ratio: 2.2 (ref 1.2–2.2)
Albumin: 4.9 g/dL — ABNORMAL HIGH (ref 3.8–4.8)
Alkaline Phosphatase: 88 IU/L (ref 39–117)
BUN/Creatinine Ratio: 27 (ref 12–28)
BUN: 14 mg/dL (ref 8–27)
Bilirubin Total: 0.7 mg/dL (ref 0.0–1.2)
CO2: 23 mmol/L (ref 20–29)
Calcium: 9.5 mg/dL (ref 8.7–10.3)
Chloride: 102 mmol/L (ref 96–106)
Creatinine, Ser: 0.51 mg/dL — ABNORMAL LOW (ref 0.57–1.00)
GFR calc Af Amer: 116 mL/min/{1.73_m2} (ref >59)
GFR calc non Af Amer: 101 mL/min/{1.73_m2} (ref >59)
Globulin, Total: 2.2 g/dL (ref 1.5–4.5)
Glucose: 97 mg/dL (ref 65–99)
Potassium: 4 mmol/L (ref 3.5–5.2)
Sodium: 141 mmol/L (ref 134–144)
Total Protein: 7.1 g/dL (ref 6.0–8.5)

## 2019-07-31 LAB — LIPID PANEL
Chol/HDL Ratio: 2.5 ratio (ref 0.0–4.4)
Cholesterol, Total: 140 mg/dL (ref 100–199)
HDL: 57 mg/dL (ref >39)
LDL Chol Calc (NIH): 72 mg/dL (ref 0–99)
Triglycerides: 52 mg/dL (ref 0–149)
VLDL Cholesterol Cal: 11 mg/dL (ref 5–40)

## 2019-07-31 LAB — CBC WITH DIFFERENTIAL/PLATELET
Basophils Absolute: 0.1 10*3/uL (ref 0.0–0.2)
Basos: 1 %
EOS (ABSOLUTE): 0.1 10*3/uL (ref 0.0–0.4)
Eos: 1 %
Hematocrit: 44 % (ref 34.0–46.6)
Hemoglobin: 14.4 g/dL (ref 11.1–15.9)
Immature Grans (Abs): 0 10*3/uL (ref 0.0–0.1)
Immature Granulocytes: 0 %
Lymphocytes Absolute: 2.1 10*3/uL (ref 0.7–3.1)
Lymphs: 33 %
MCH: 31.8 pg (ref 26.6–33.0)
MCHC: 32.7 g/dL (ref 31.5–35.7)
MCV: 97 fL (ref 79–97)
Monocytes Absolute: 0.7 10*3/uL (ref 0.1–0.9)
Monocytes: 10 %
Neutrophils Absolute: 3.5 10*3/uL (ref 1.4–7.0)
Neutrophils: 55 %
Platelets: 373 10*3/uL (ref 150–450)
RBC: 4.53 x10E6/uL (ref 3.77–5.28)
RDW: 13.1 % (ref 11.7–15.4)
WBC: 6.5 10*3/uL (ref 3.4–10.8)

## 2019-07-31 NOTE — Telephone Encounter (Signed)
-----   Message from Virginia Crews, MD sent at 07/31/2019 11:35 AM EDT ----- Normal labs

## 2019-07-31 NOTE — Telephone Encounter (Signed)
Left message advising pt.  (Per DPR)  Thanks,   -Tiawana Forgy  

## 2019-08-06 ENCOUNTER — Ambulatory Visit
Admission: RE | Admit: 2019-08-06 | Discharge: 2019-08-06 | Disposition: A | Discharge status: Home / Self Care | Payer: Medicare Other | Source: Ambulatory Visit | Attending: Pain Medicine | Admitting: Pain Medicine

## 2019-08-06 ENCOUNTER — Other Ambulatory Visit: Payer: Self-pay

## 2019-08-06 ENCOUNTER — Encounter: Payer: Self-pay | Admitting: Pain Medicine

## 2019-08-06 ENCOUNTER — Ambulatory Visit (HOSPITAL_BASED_OUTPATIENT_CLINIC_OR_DEPARTMENT_OTHER): Payer: Medicare Other | Admitting: Pain Medicine

## 2019-08-06 VITALS — BP 145/73 | HR 57 | Temp 97.8°F | Resp 15 | Ht 62.0 in | Wt 142.0 lb

## 2019-08-06 DIAGNOSIS — M5441 Lumbago with sciatica, right side: Secondary | ICD-10-CM | POA: Insufficient documentation

## 2019-08-06 DIAGNOSIS — M47817 Spondylosis without myelopathy or radiculopathy, lumbosacral region: Secondary | ICD-10-CM | POA: Insufficient documentation

## 2019-08-06 DIAGNOSIS — M4310 Spondylolisthesis, site unspecified: Secondary | ICD-10-CM | POA: Insufficient documentation

## 2019-08-06 DIAGNOSIS — G8918 Other acute postprocedural pain: Secondary | ICD-10-CM | POA: Diagnosis not present

## 2019-08-06 DIAGNOSIS — M47816 Spondylosis without myelopathy or radiculopathy, lumbar region: Secondary | ICD-10-CM | POA: Diagnosis not present

## 2019-08-06 DIAGNOSIS — G8929 Other chronic pain: Secondary | ICD-10-CM | POA: Diagnosis not present

## 2019-08-06 DIAGNOSIS — M5442 Lumbago with sciatica, left side: Secondary | ICD-10-CM

## 2019-08-06 DIAGNOSIS — M5137 Other intervertebral disc degeneration, lumbosacral region: Secondary | ICD-10-CM | POA: Diagnosis not present

## 2019-08-06 MED ORDER — TRIAMCINOLONE ACETONIDE 40 MG/ML IJ SUSP
40.0000 mg | Freq: Once | INTRAMUSCULAR | Status: AC
  Administered 2019-08-06: 40 mg

## 2019-08-06 MED ORDER — FENTANYL CITRATE (PF) 100 MCG/2ML IJ SOLN
25.0000 ug | INTRAMUSCULAR | Status: DC | PRN
  Administered 2019-08-06: 25 ug via INTRAVENOUS

## 2019-08-06 MED ORDER — MIDAZOLAM HCL 5 MG/5ML IJ SOLN
1.0000 mg | INTRAMUSCULAR | Status: DC | PRN
  Administered 2019-08-06: 0.5 mg via INTRAVENOUS
  Administered 2019-08-06: 1.5 mg via INTRAVENOUS

## 2019-08-06 MED ORDER — LIDOCAINE HCL 2 % IJ SOLN
20.0000 mL | Freq: Once | INTRAMUSCULAR | Status: AC
  Administered 2019-08-06: 400 mg

## 2019-08-06 MED ORDER — LACTATED RINGERS IV SOLN
1000.0000 mL | Freq: Once | INTRAVENOUS | Status: AC
  Administered 2019-08-06: 1000 mL via INTRAVENOUS

## 2019-08-06 MED ORDER — LIDOCAINE HCL 2 % IJ SOLN
INTRAMUSCULAR | Status: AC
  Filled 2019-08-06: qty 20

## 2019-08-06 MED ORDER — OXYCODONE-ACETAMINOPHEN 5-325 MG PO TABS: 1.0000 | ORAL_TABLET | Freq: Four times a day (QID) | ORAL | 0 refills | Status: AC | PRN

## 2019-08-06 MED ORDER — ROPIVACAINE HCL 2 MG/ML IJ SOLN
9.0000 mL | Freq: Once | INTRAMUSCULAR | Status: AC
  Administered 2019-08-06: 9 mL via PERINEURAL

## 2019-08-06 MED ORDER — MIDAZOLAM HCL 5 MG/5ML IJ SOLN
INTRAMUSCULAR | Status: AC
  Filled 2019-08-06: qty 5

## 2019-08-06 MED ORDER — FENTANYL CITRATE (PF) 100 MCG/2ML IJ SOLN
INTRAMUSCULAR | Status: AC
  Filled 2019-08-06: qty 2

## 2019-08-06 MED ORDER — TRIAMCINOLONE ACETONIDE 40 MG/ML IJ SUSP
INTRAMUSCULAR | Status: AC
  Filled 2019-08-06: qty 1

## 2019-08-06 MED ORDER — ROPIVACAINE HCL 2 MG/ML IJ SOLN
INTRAMUSCULAR | Status: AC
  Filled 2019-08-06: qty 10

## 2019-08-06 NOTE — Progress Notes (Addendum)
PROVIDER NOTE: Information contained herein reflects review and annotations entered in association with encounter. Interpretation of such information and data should be left to medically-trained personnel. Information provided to patient can be located elsewhere in the medical record under "Patient Instructions". Document created using STT-dictation technology, any transcriptional errors that may result from process are unintentional.    Patient: Ashley Rodriguez  Service Category: Procedure  Provider: Gaspar Cola, MD  DOB: 1950/08/01  DOS: 08/06/2019  Location: [facility]  MRN: [id number]  Setting: Ambulatory - outpatient  Referring Provider: Milinda Pointer, MD  Type: Established Patient  Specialty: Interventional Pain Management  PCP: Virginia Crews, MD   Primary Reason for Visit: Interventional Pain Management Treatment. CC: Back Pain (low)  Procedure:          Anesthesia, Analgesia, Anxiolysis:  Type: Thermal Lumbar Facet, Medial Branch Radiofrequency Ablation/Neurotomy  #1  Primary Purpose: Therapeutic Region: Posterolateral Lumbosacral Spine Level: L2, L3, L4, L5, & S1 Medial Branch Level(s). These levels will denervate the L3-4, L4-5, and the L5-S1 lumbar facet joints. Laterality: Left  Type: Moderate (Conscious) Sedation combined with Local Anesthesia Indication(s): Analgesia and Anxiety Route: Intravenous (IV) IV Access: Secured Sedation: Meaningful verbal contact was maintained at all times during the procedure  Local Anesthetic: Lidocaine 1-2%  Position: Prone   Indications: 1. Spondylosis without myelopathy or radiculopathy, lumbosacral region   2. Lumbar facet syndrome (Bilateral) (R>L)   3. DDD (degenerative disc disease), lumbosacral   4. Grade 1 Anterolisthesis of L5 over S1   5. Osteoarthritis of lumbar spine   6. Chronic low back pain (Primary Area of Pain) (Bilateral) (R>L)   7. Acute postoperative pain    [name] has been dealing  with the above chronic pain for longer than three months and has either failed to respond, was unable to tolerate, or simply did not get enough benefit from other more conservative therapies including, but not limited to: 1. Over-the-counter medications 2. Anti-inflammatory medications 3. Muscle relaxants 4. Membrane stabilizers 5. Opioids 6. Physical therapy and/or chiropractic manipulation 7. Modalities (Heat, ice, etc.) 8. Invasive techniques such as nerve blocks. [name] has attained more than 50% relief of the pain from a series of diagnostic injections conducted in separate occasions.  Pain Score: Pre-procedure: 3 /10 Post-procedure: 0-No pain/10  Pre-op Assessment:  [name] is a 69 y.o. (year old), female patient, seen today for interventional treatment. She  has a past surgical history that includes Abdominal hysterectomy (2000); lipoma removed (04/2012); Colonoscopy (2015); and Neck mass excision (04/28/14). [name] has a current medication list which includes the following prescription(s): acetaminophen, amlodipine, atenolol, atorvastatin, betamethasone dipropionate, calcium carbonate, enbrel sureclick, esomeprazole, fexofenadine, fluticasone, gabapentin, losartan, meloxicam, montelukast, multivitamin with minerals, turmeric, valacyclovir, oxycodone-acetaminophen, and [START ON 08/13/2019] oxycodone-acetaminophen, and the following Facility-Administered Medications: fentanyl and midazolam. Her primarily concern today is the Back Pain (low)  Initial Vital Signs:  Pulse/HCG Rate: 62ECG Heart Rate: (!) 56 Temp: 97.8 F (36.6 C) Resp: 18 BP: (!) 157/117(retake was 137/71) SpO2: 97 %  BMI: Estimated body mass index is 25.97 kg/m as calculated from the following:   Height as of this encounter: 5\' 2"  (1.575 m).   Weight as of this encounter: 142 lb (64.4 kg).  Risk Assessment: Allergies: Reviewed. She is allergic to codeine and sulfa antibiotics.  Allergy Precautions: None  required Coagulopathies: Reviewed. None identified.  Blood-thinner therapy: None at this time Active Infection(s): Reviewed. None identified. [name] is afebrile  Site Confirmation: [name]  was asked to confirm the procedure and laterality before marking the site Procedure checklist: Completed Consent: Before the procedure and under the influence of no sedative(s), amnesic(s), or anxiolytics, the patient was informed of the treatment options, risks and possible complications. To fulfill our ethical and legal obligations, as recommended by the American Medical Association's Code of Ethics, I have informed the patient of my clinical impression; the nature and purpose of the treatment or procedure; the risks, benefits, and possible complications of the intervention; the alternatives, including doing nothing; the risk(s) and benefit(s) of the alternative treatment(s) or procedure(s); and the risk(s) and benefit(s) of doing nothing. The patient was provided information about the general risks and possible complications associated with the procedure. These may include, but are not limited to: failure to achieve desired goals, infection, bleeding, organ or nerve damage, allergic reactions, paralysis, and death. In addition, the patient was informed of those risks and complications associated to Spine-related procedures, such as failure to decrease pain; infection (i.e.: Meningitis, epidural or intraspinal abscess); bleeding (i.e.: epidural hematoma, subarachnoid hemorrhage, or any other type of intraspinal or peri-dural bleeding); organ or nerve damage (i.e.: Any type of peripheral nerve, nerve root, or spinal cord injury) with subsequent damage to sensory, motor, and/or autonomic systems, resulting in permanent pain, numbness, and/or weakness of one or several areas of the body; allergic reactions; (i.e.: anaphylactic reaction); and/or death. Furthermore, the patient was informed of those risks and  complications associated with the medications. These include, but are not limited to: allergic reactions (i.e.: anaphylactic or anaphylactoid reaction(s)); adrenal axis suppression; blood sugar elevation that in diabetics may result in ketoacidosis or comma; water retention that in patients with history of congestive heart failure may result in shortness of breath, pulmonary edema, and decompensation with resultant heart failure; weight gain; swelling or edema; medication-induced neural toxicity; particulate matter embolism and blood vessel occlusion with resultant organ, and/or nervous system infarction; and/or aseptic necrosis of one or more joints. Finally, the patient was informed that Medicine is not an exact science; therefore, there is also the possibility of unforeseen or unpredictable risks and/or possible complications that may result in a catastrophic outcome. The patient indicated having understood very clearly. We have given the patient no guarantees and we have made no promises. Enough time was given to the patient to ask questions, all of which were answered to the patient's satisfaction. Ms. Kines has indicated that she wanted to continue with the procedure. Attestation: I, the ordering provider, attest that I have discussed with the patient the benefits, risks, side-effects, alternatives, likelihood of achieving goals, and potential problems during recovery for the procedure that I have provided informed consent. Date  Time: 08/06/2019  1:01 PM  Pre-Procedure Preparation:  Monitoring: As per clinic protocol. Respiration, ETCO2, SpO2, BP, heart rate and rhythm monitor placed and checked for adequate function Safety Precautions: Patient was assessed for positional comfort and pressure points before starting the procedure. Time-out: I initiated and conducted the "Time-out" before starting the procedure, as per protocol. The patient was asked to participate by confirming the accuracy of the  "Time Out" information. Verification of the correct person, site, and procedure were performed and confirmed by me, the nursing staff, and the patient. "Time-out" conducted as per Joint Commission's Universal Protocol (UP.01.01.01). Time: 1337  Description of Procedure:          Laterality: Left Levels:  L2, L3, L4, L5, & S1 Medial Branch Level(s), at the L3-4, L4-5, and the L5-S1 lumbar facet joints.  Area Prepped: Lumbosacral DuraPrep (Iodine Povacrylex [0.7% available iodine] and Isopropyl Alcohol, 74% w/w) Safety Precautions: Aspiration looking for blood return was conducted prior to all injections. At no point did we inject any substances, as a needle was being advanced. Before injecting, the patient was told to immediately notify me if she was experiencing any new onset of "ringing in the ears, or metallic taste in the mouth". No attempts were made at seeking any paresthesias. Safe injection practices and needle disposal techniques used. Medications properly checked for expiration dates. SDV (single dose vial) medications used. After the completion of the procedure, all disposable equipment used was discarded in the proper designated medical waste containers. Local Anesthesia: Protocol guidelines were followed. The patient was positioned over the fluoroscopy table. The area was prepped in the usual manner. The time-out was completed. The target area was identified using fluoroscopy. A 12-in long, straight, sterile hemostat was used with fluoroscopic guidance to locate the targets for each level blocked. Once located, the skin was marked with an approved surgical skin marker. Once all sites were marked, the skin (epidermis, dermis, and hypodermis), as well as deeper tissues (fat, connective tissue and muscle) were infiltrated with a small amount of a short-acting local anesthetic, loaded on a 10cc syringe with a 25G, 1.5-in  Needle. An appropriate amount of time was allowed for local anesthetics to take  effect before proceeding to the next step. Local Anesthetic: Lidocaine 2.0% The unused portion of the local anesthetic was discarded in the proper designated containers. Technical explanation of process:  Radiofrequency Ablation (RFA) L2 Medial Branch Nerve RFA: The target area for the L2 medial branch is at the junction of the postero-lateral aspect of the superior articular process and the superior, posterior, and medial edge of the transverse process of L3. Under fluoroscopic guidance, a Radiofrequency needle was inserted until contact was made with os over the superior postero-lateral aspect of the pedicular shadow (target area). Sensory and motor testing was conducted to properly adjust the position of the needle. Once satisfactory placement of the needle was achieved, the numbing solution was slowly injected after negative aspiration for blood. 2.0 mL of the nerve block solution was injected without difficulty or complication. After waiting for at least 3 minutes, the ablation was performed. Once completed, the needle was removed intact. L3 Medial Branch Nerve RFA: The target area for the L3 medial branch is at the junction of the postero-lateral aspect of the superior articular process and the superior, posterior, and medial edge of the transverse process of L4. Under fluoroscopic guidance, a Radiofrequency needle was inserted until contact was made with os over the superior postero-lateral aspect of the pedicular shadow (target area). Sensory and motor testing was conducted to properly adjust the position of the needle. Once satisfactory placement of the needle was achieved, the numbing solution was slowly injected after negative aspiration for blood. 2.0 mL of the nerve block solution was injected without difficulty or complication. After waiting for at least 3 minutes, the ablation was performed. Once completed, the needle was removed intact. L4 Medial Branch Nerve RFA: The target area for the L4  medial branch is at the junction of the postero-lateral aspect of the superior articular process and the superior, posterior, and medial edge of the transverse process of L5. Under fluoroscopic guidance, a Radiofrequency needle was inserted until contact was made with os over the superior postero-lateral aspect of the pedicular shadow (target area). Sensory and motor testing was conducted to properly adjust the  position of the needle. Once satisfactory placement of the needle was achieved, the numbing solution was slowly injected after negative aspiration for blood. 2.0 mL of the nerve block solution was injected without difficulty or complication. After waiting for at least 3 minutes, the ablation was performed. Once completed, the needle was removed intact. L5 Medial Branch Nerve RFA: The target area for the L5 medial branch is at the junction of the postero-lateral aspect of the superior articular process of S1 and the superior, posterior, and medial edge of the sacral ala. Under fluoroscopic guidance, a Radiofrequency needle was inserted until contact was made with os over the superior postero-lateral aspect of the pedicular shadow (target area). Sensory and motor testing was conducted to properly adjust the position of the needle. Once satisfactory placement of the needle was achieved, the numbing solution was slowly injected after negative aspiration for blood. 2.0 mL of the nerve block solution was injected without difficulty or complication. After waiting for at least 3 minutes, the ablation was performed. Once completed, the needle was removed intact. S1 Medial Branch Nerve RFA: The target area for the S1 medial branch is located inferior to the junction of the S1 superior articular process and the L5 inferior articular process, posterior, inferior, and lateral to the 6 o'clock position of the L5-S1 facet joint, just superior to the S1 posterior foramen. Under fluoroscopic guidance, the Radiofrequency  needle was advanced until contact was made with os over the Target area. Sensory and motor testing was conducted to properly adjust the position of the needle. Once satisfactory placement of the needle was achieved, the numbing solution was slowly injected after negative aspiration for blood. 2.0 mL of the nerve block solution was injected without difficulty or complication. After waiting for at least 3 minutes, the ablation was performed. Once completed, the needle was removed intact. Radiofrequency lesioning (ablation):  Radiofrequency Generator: NeuroTherm [id number] Sensory Stimulation Parameters: 50 Hz was used to locate & identify the nerve, making sure that the needle was positioned such that there was no sensory stimulation below 0.3 V or above 0.7 V. Motor Stimulation Parameters: 2 Hz was used to evaluate the motor component. Care was taken not to lesion any nerves that demonstrated motor stimulation of the lower extremities at an output of less than 2.5 times that of the sensory threshold, or a maximum of 2.0 V. Lesioning Technique Parameters: Standard Radiofrequency settings. (Not bipolar or pulsed.) Temperature Settings: 80 degrees C Lesioning time: 60 seconds Intra-operative Compliance: Compliant Materials & Medications: Needle(s) (Electrode/Cannula) Type: Teflon-coated, curved tip, Radiofrequency needle(s) Gauge: 22G Length: 10cm Numbing solution: 0.2% PF-Ropivacaine + Triamcinolone (40 mg/mL) diluted to a final concentration of 4 mg of Triamcinolone/mL of Ropivacaine The unused portion of the solution was discarded in the proper designated containers.  Once the entire procedure was completed, the treated area was cleaned, making sure to leave some of the prepping solution back to take advantage of its long term bactericidal properties.  Illustration of the posterior view of the lumbar spine and the posterior neural structures. Laminae of L2 through S1 are labeled. DPRL5, dorsal primary  ramus of L5; DPRS1, dorsal primary ramus of S1; DPR3, dorsal primary ramus of L3; FJ, facet (zygapophyseal) joint L3-L4; I, inferior articular process of L4; LB1, lateral branch of dorsal primary ramus of L1; IAB, inferior articular branches from L3 medial branch (supplies L4-L5 facet joint); IBP, intermediate branch plexus; MB3, medial branch of dorsal primary ramus of L3; NR3, third lumbar nerve root; S, superior  articular process of L5; SAB, superior articular branches from L4 (supplies L4-5 facet joint also); TP3, transverse process of L3.  Vitals:   08/06/19 1400 08/06/19 1410 08/06/19 1420 08/06/19 1432  BP: 119/68 120/68 128/67 (!) 145/73  Pulse: (!) 57     Resp: 13 14 14 15   Temp:  (!) 97.4 F (36.3 C)  97.8 F (36.6 C)  TempSrc:  Temporal  Temporal  SpO2: 95% 95% 96% 95%  Weight:      Height:       Start Time: 1337 hrs. End Time: 1403 hrs.  Imaging Guidance (Spinal):          Type of Imaging Technique: Fluoroscopy Guidance (Spinal) Indication(s): Assistance in needle guidance and placement for procedures requiring needle placement in or near specific anatomical locations not easily accessible without such assistance. Exposure Time: Please see nurses notes. Contrast: None used. Fluoroscopic Guidance: I was personally present during the use of fluoroscopy. "Tunnel Vision Technique" used to obtain the best possible view of the target area. Parallax error corrected before commencing the procedure. "Direction-depth-direction" technique used to introduce the needle under continuous pulsed fluoroscopy. Once target was reached, antero-posterior, oblique, and lateral fluoroscopic projection used confirm needle placement in all planes. Images permanently stored in EMR. Interpretation: No contrast injected. I personally interpreted the imaging intraoperatively. Adequate needle placement confirmed in multiple planes. Permanent images saved into the patient's record.  Antibiotic Prophylaxis:    Anti-infectives (From admission, onward)   None     Indication(s): None identified  Post-operative Assessment:  Post-procedure Vital Signs:  Pulse/HCG Rate: (!) 57(!) 54 Temp: 97.8 F (36.6 C) Resp: 15 BP: (!) 145/73 SpO2: 95 %  EBL: None  Complications: No immediate post-treatment complications observed by team, or reported by patient.  Note: The patient tolerated the entire procedure well. A repeat set of vitals were taken after the procedure and the patient was kept under observation following institutional policy, for this type of procedure. Post-procedural neurological assessment was performed, showing return to baseline, prior to discharge. The patient was provided with post-procedure discharge instructions, including a section on how to identify potential problems. Should any problems arise concerning this procedure, the patient was given instructions to immediately contact us, at any time, without hesitation. In any case, we plan to contact the patient by telephone for a follow-up status report regarding this interventional procedure.  Comments:  No additional relevant information.  Plan of Care  Orders:  Orders Placed This Encounter  Procedures  . Radiofrequency,Lumbar    Scheduling Instructions:     Side(s): Left-sided     Level: L3-4, L4-5, & L5-S1 Facets (L2, L3, L4, L5, & S1 Medial Branch Nerves)     Sedation: With Sedation     Timeframe: Today    Order Specific Question:   Where will this procedure be performed?    Answer:   [facility]  . [facility] C-ARM 1-60 MIN NO REPORT    Intraoperative interpretation by procedural physician at [place].    Standing Status:   Standing    Number of Occurrences:   1    Order Specific Question:   Reason for exam:    Answer:   Assistance in needle guidance and placement for procedures requiring needle placement in or near specific anatomical locations not easily accessible without such assistance.   . Informed Consent Details: Physician/Practitioner Attestation; Transcribe to consent form and obtain patient signature    Nursing Order: Transcribe to consent form  and obtain patient signature. Note: Always confirm laterality of pain with Ms. Lilia Pro, before procedure. Procedure: Lumbar Facet Radiofrequency Ablation Indication/Reason: Low Back Pain, with our without leg pain, due to Facet Joint Arthralgia (Joint Pain) known as Lumbar Facet Syndrome, secondary to Lumbar, and/or Lumbosacral Spondylosis (Arthritis of the Spine), without myelopathy or radiculopathy (Nerve Damage). Provider Attestation: I, Bonham Dossie Arbour, MD, (Pain Management Specialist), the physician/practitioner, attest that I have discussed with the patient the benefits, risks, side effects, alternatives, likelihood of achieving goals and potential problems during recovery for the procedure that I have provided informed consent.  . Provide equipment / supplies at bedside    Equipment required: Sterile "Radiofrequency Tray"; Large hemostat (1); Small hemostat (1); Towels (6-8); 4x4 sterile sponge pack (1) Radiofrequency Needle(s): Size: Long Quantity: 5    Standing Status:   Standing    Number of Occurrences:   1    Order Specific Question:   Specify    Answer:   Radiofrequency Tray   Chronic Opioid Analgesic:  No opioid analgesics from our practice.   Medications ordered for procedure: Meds ordered this encounter  Medications  . oxyCODONE-acetaminophen (PERCOCET) 5-325 MG tablet    Sig: Take 1 tablet by mouth every 6 (six) hours as needed for up to 7 days for severe pain. Must last 7 days.    Dispense:  28 tablet    Refill:  0    For acute post-operative pain. Not to be refilled. Most last 7 days.  Marland Kitchen oxyCODONE-acetaminophen (PERCOCET) 5-325 MG tablet    Sig: Take 1 tablet by mouth every 6 (six) hours as needed for up to 7 days for severe pain. Must last 7 days.    Dispense:  28 tablet    Refill:  0    For acute  post-operative pain. Not to be refilled. Must last 7 days.  Marland Kitchen lidocaine (XYLOCAINE) 2 % (with pres) injection 400 mg  . lactated ringers infusion 1,000 mL  . midazolam (VERSED) 5 MG/5ML injection 1-2 mg    Make sure Flumazenil is available in the pyxis when using this medication. If oversedation occurs, administer 0.2 mg IV over 15 sec. If after 45 sec no response, administer 0.2 mg again over 1 min; may repeat at 1 min intervals; not to exceed 4 doses (1 mg)  . fentaNYL (SUBLIMAZE) injection 25-50 mcg    Make sure Narcan is available in the pyxis when using this medication. In the event of respiratory depression (RR< 8/min): Titrate NARCAN (naloxone) in increments of 0.1 to 0.2 mg IV at 2-3 minute intervals, until desired degree of reversal.  . ropivacaine (PF) 2 mg/mL (0.2%) (NAROPIN) injection 9 mL  . triamcinolone acetonide (KENALOG-40) injection 40 mg   Medications administered: We administered lidocaine, lactated ringers, midazolam, fentaNYL, ropivacaine (PF) 2 mg/mL (0.2%), and triamcinolone acetonide.  See the medical record for exact dosing, route, and time of administration.  Follow-up plan:   Return in about 6 weeks (around 09/17/2019) for (VV), (PP).       Interventional management options: Planned, scheduled, and/or pending:   None at this time.    Considering:   Possible bilateral lumbar facet RFA Diagnostic bilateral SI joint block Possible bilateral SI joint RFA Diagnostic bilateral hip joint injection Possible bilateral femoral +obturator NB Possible bilateral femoral + obturator nerve RFA Diagnostic right L4-5 LESI #2   Palliative PRN treatment(s):   Palliativebilateral lumbar facet block#6(diagnostic lumbar facet blocks provide the patient with 90 to 100% relief of the pain,  unfortunately they did not last.  The insurance company has denied radiofrequency ablation for some unknown reason.)       Recent Visits Date Type Provider Dept  07/21/19 Procedure  visit Milinda Pointer, MD [facility]  Showing recent visits within past 90 days and meeting all other requirements   Today's Visits Date Type Provider Dept  08/06/19 Procedure visit Milinda Pointer, MD [facility]  Showing today's visits and meeting all other requirements   Future Appointments Date Type Provider Dept  09/17/19 Appointment Milinda Pointer, MD [facility]  Showing future appointments within next 90 days and meeting all other requirements   Disposition: Discharge home  Discharge (Date  Time): 08/06/2019; 1433 hrs.   Primary Care Physician: Virginia Crews, MD Location: [facility] Note by: Gaspar Cola, MD Date: 08/06/2019; Time: 2:38 PM  Disclaimer:  Medicine is not an Chief Strategy Officer. The only guarantee in medicine is that nothing is guaranteed. It is important to note that the decision to proceed with this intervention was based on the information collected from the patient. The Data and conclusions were drawn from the patient's questionnaire, the interview, and the physical examination. Because the information was provided in large part by the patient, it cannot be guaranteed that it has not been purposely or unconsciously manipulated. Every effort has been made to obtain as much relevant data as possible for this evaluation. It is important to note that the conclusions that lead to this procedure are derived in large part from the available data. Always take into account that the treatment will also be dependent on availability of resources and existing treatment guidelines, considered by other Pain Management Practitioners as being common knowledge and practice, at the time of the intervention. For Medico-Legal purposes, it is also important to point out that variation in procedural techniques and pharmacological choices are the acceptable norm. The indications, contraindications, technique, and  results of the above procedure should only be interpreted and judged by a Board-Certified Interventional Pain Specialist with extensive familiarity and expertise in the same exact procedure and technique.

## 2019-08-06 NOTE — Patient Instructions (Signed)

## 2019-08-06 NOTE — Progress Notes (Signed)
Safety precautions to be maintained throughout the outpatient stay will include: orient to surroundings, keep bed in low position, maintain call bell within reach at all times, provide assistance with transfer out of bed and ambulation.  

## 2019-08-07 ENCOUNTER — Telehealth: Payer: Self-pay | Admitting: *Deleted

## 2019-08-07 NOTE — Telephone Encounter (Signed)
No problems post procedure. 

## 2019-08-11 ENCOUNTER — Other Ambulatory Visit: Payer: Self-pay | Admitting: Family Medicine

## 2019-09-17 ENCOUNTER — Other Ambulatory Visit: Payer: Self-pay

## 2019-09-17 ENCOUNTER — Ambulatory Visit: Payer: Medicare Other | Attending: Pain Medicine | Admitting: Pain Medicine

## 2019-09-17 DIAGNOSIS — M47817 Spondylosis without myelopathy or radiculopathy, lumbosacral region: Secondary | ICD-10-CM | POA: Diagnosis not present

## 2019-09-17 NOTE — Progress Notes (Signed)
Patient: Ashley Rodriguez  Service Category: E/M  Provider: Gaspar Cola, MD  DOB: 1951/04/22  DOS: 09/17/2019  Location: Office  MRN: [id number]  Setting: Ambulatory outpatient  Referring Provider: Virginia Crews, MD  Type: Established Patient  Specialty: Interventional Pain Management  PCP: Ashley Crews, MD  Location: Remote location  Delivery: TeleHealth     Virtual Encounter - Pain Management PROVIDER NOTE: Information contained herein reflects review and annotations entered in association with encounter. Interpretation of such information and data should be left to medically-trained personnel. Information provided to patient can be located elsewhere in the medical record under "Patient Instructions". Document created using STT-dictation technology, any transcriptional errors that may result from process are unintentional.    Contact & Pharmacy Preferred: [place]: [phone] (home) Mobile: [phone] (mobile) E-mail: [email or web address]  [place] [place] [facility] [place] [address] [place] [address] Phone: [phone] Fax: [phone]   Pre-screening  Ashley Rodriguez offered "in-person" vs "virtual" encounter. She indicated preferring virtual for this encounter.   Reason COVID-19*  Social distancing based on CDC and AMA recommendations.   I contacted Ashley Rodriguez on 09/17/2019 via telephone.      I clearly identified myself as Gaspar Cola, MD. I verified that I was speaking with the correct person using two identifiers (Name: Ashley Rodriguez, and date of birth: 1950/09/22).  Consent I sought verbal advanced consent from Ashley Rodriguez for virtual visit interactions. I informed Ashley Rodriguez of possible security and privacy concerns, risks, and limitations associated with providing "not-in-person" medical evaluation and management services. I also informed Ashley Rodriguez of the availability of "in-person" appointments.  Finally, I informed her that there would be a charge for the virtual visit and that she could be  personally, fully or partially, financially responsible for it. Ashley Rodriguez expressed understanding and agreed to proceed.   Historic Elements   Ashley Rodriguez is a 69 y.o. year old, female patient evaluated today after her last contact with our practice on 08/07/2019. Ashley Rodriguez  has a past medical history of COVID-19 (12/15/2018), Flank lipoma (08/17/2016), Hypertension, Neck mass (04/07/2013), Psoriatic arthritis ([place]), Recurrent cellulitis, and T2DM (type 2 diabetes mellitus) ([place]) (10/01/2018). She also  has a past surgical history that includes Abdominal hysterectomy (2000); lipoma removed (04/2012); Colonoscopy (2015); and Neck mass excision (04/28/14). [name] has a current medication list which includes the following prescription(s): acetaminophen, amlodipine, atenolol, atorvastatin, betamethasone dipropionate, calcium carbonate, enbrel sureclick, esomeprazole, fexofenadine, fluticasone, gabapentin, losartan, meloxicam, montelukast, multivitamin with minerals, turmeric, and valacyclovir. She  reports that she has never smoked. She has never used smokeless tobacco. She reports current alcohol use of about 4.0 standard drinks of alcohol per week. She reports that she does not use drugs. Ashley Rodriguez is allergic to codeine and sulfa antibiotics.   HPI  Today, she is being contacted for a post-procedure assessment.  According to the patient she has attained approximately 85% improvement on the left lower back pain compared to when she first came in.  She also indicates having approximately 75% improvement on the right side.  She is a little disappointed because in her mind she was thinking that we were going to be able to completely eliminate the pain, which we repeated her many times the that was not cannot be an option, since were dealing with "chronic pain".  She understood and accepted.  She indicates that  for now she is doing okay and does not need anything else.  She indicated  that she will call us when she does.  Post-Procedure Evaluation  Procedure: Therapeutic left lumbar facet RFA #1 under fluoroscopic guidance and IV sedation on 08/06/2019.  In addition the patient had a therapeutic right-sided lumbar facet RFA #1 under fluoroscopic guidance and IV sedation done on 07/21/2019. Pre-procedure pain level: 3/10 Post-procedure: 0/10 (100% relief)  Sedation: Sedation provided.  Effectiveness during initial hour after procedure(Ultra-Short Term Relief): 100 % .  Local anesthetic used: Long-acting (4-6 hours) Effectiveness: Defined as any analgesic benefit obtained secondary to the administration of local anesthetics. This carries significant diagnostic value as to the etiological location, or anatomical origin, of the pain. Duration of benefit is expected to coincide with the duration of the local anesthetic used.  Effectiveness during initial 4-6 hours after procedure(Short-Term Relief): 100 % .  Long-term benefit: Defined as any relief past the pharmacologic duration of the local anesthetics.  Effectiveness past the initial 6 hours after procedure(Long-Term Relief): 75 % .  Current benefits: Defined as benefit that persist at this time.   Analgesia:  >75% relief. (L 85 ;R 75) Function: Ashley Rodriguez reports improvement in function ROM: Ashley Rodriguez reports improvement in ROM  Pharmacotherapy Assessment  Analgesic: No opioid analgesics from our practice.   Monitoring: [place] PMP: PDMP reviewed during this encounter.       Pharmacotherapy: No side-effects or adverse reactions reported. Compliance: No problems identified. Effectiveness: Clinically acceptable. Plan: Refer to "POC".  UDS: No results found for: SUMMARY Laboratory Chemistry Profile   Renal Lab Results  Component Value Date   BUN 14 07/30/2019   CREATININE 0.51 (L) 07/30/2019   BCR 27 07/30/2019   GFRAA 116 07/30/2019   GFRNONAA  101 07/30/2019     Hepatic Lab Results  Component Value Date   AST 17 07/30/2019   ALT 22 07/30/2019   ALBUMIN 4.9 (H) 07/30/2019   ALKPHOS 88 07/30/2019     Electrolytes Lab Results  Component Value Date   NA 141 07/30/2019   K 4.0 07/30/2019   CL 102 07/30/2019   CALCIUM 9.5 07/30/2019   MG 2.1 01/03/2017     Bone Lab Results  Component Value Date   25OHVITD1 50 01/03/2017   25OHVITD2 <1.0 01/03/2017   25OHVITD3 50 01/03/2017     Inflammation (CRP: Acute Phase) (ESR: Chronic Phase) Lab Results  Component Value Date   CRP 0.8 01/03/2017   ESRSEDRATE 6 01/03/2017       Note: Above Lab results reviewed.  Imaging  DG PAIN CLINIC C-ARM 1-60 MIN NO REPORT Fluoro was used, but no Radiologist interpretation will be provided.  Please refer to "NOTES" tab for provider progress note.  Assessment  There were no encounter diagnoses.  Plan of Care  Problem-specific:  No problem-specific Assessment & Plan notes found for this encounter.  Ms. Ryeleigh Rodriguez has a current medication list which includes the following long-term medication(s): amlodipine, atenolol, atorvastatin, calcium carbonate, enbrel sureclick, esomeprazole, fexofenadine, fluticasone, gabapentin, losartan, and montelukast.  Pharmacotherapy (Medications Ordered): No orders of the defined types were placed in this encounter.  Orders:  No orders of the defined types were placed in this encounter.  Follow-up plan:   Return if symptoms worsen or fail to improve.      Interventional management options: Planned, scheduled, and/or pending:   None at this time.    Considering:   Diagnostic bilateral SI joint block Possible bilateral SI joint RFA Diagnostic bilateral hip joint injection Possible bilateral femoral +obturator NB Possible bilateral femoral + obturator nerve  RFA Diagnostic right L4-5 LESI #2   Palliative PRN treatment(s):   Palliativebilateral lumbar facet block#6(diagnostic  lumbar facet blocks provide the patient with 90 to 100% relief of the pain, unfortunately they did not last.  The insurance company has denied radiofrequency ablation for some unknown reason.) Palliative right lumbar facet RFA #2 (last done on 07/21/2019) (100/100/75)  Palliative left lumbar facet RFA #2 (last done on 08/06/2019)  (100/100/75)     Recent Visits Date Type Provider Dept  08/06/19 Procedure visit Milinda Pointer, MD [facility]  07/21/19 Procedure visit Milinda Pointer, MD [facility]  Showing recent visits within past 90 days and meeting all other requirements   Today's Visits Date Type Provider Dept  09/17/19 Telemedicine Milinda Pointer, MD [facility]  Showing today's visits and meeting all other requirements   Future Appointments No visits were found meeting these conditions.  Showing future appointments within next 90 days and meeting all other requirements   I discussed the assessment and treatment plan with the patient. The patient was provided an opportunity to ask questions and all were answered. The patient agreed with the plan and demonstrated an understanding of the instructions.  Patient advised to call back or seek an in-person evaluation if the symptoms or condition worsens.  Duration of encounter: 15 minutes.  Note by: Gaspar Cola, MD Date: 09/17/2019; Time: 12:51 PM

## 2019-09-17 NOTE — Patient Instructions (Signed)

## 2019-10-14 ENCOUNTER — Other Ambulatory Visit: Payer: Self-pay | Admitting: Family Medicine

## 2019-11-10 ENCOUNTER — Other Ambulatory Visit: Payer: Self-pay | Admitting: Family Medicine

## 2019-11-10 NOTE — Telephone Encounter (Signed)
Requested medication (s) are due for refill today: ys  Requested medication (s) are on the active medication list: historical med  Last refill:  07/24/2016  Future visit scheduled: yes  Notes to clinic:  historical med    Requested Prescriptions  Pending Prescriptions Disp Refills   valACYclovir (VALTREX) 1000 MG tablet [Pharmacy Med Name: VALACYCLOVIR HCL 1GM TAB] 30 tablet     Sig: TAKE 2 TABLETS BY MOUTH TWICE DAILY FOR 1 DAY WHEN FEVER BLISTER OCCURS      Antimicrobials:  Antiviral Agents - Anti-Herpetic Passed - 11/10/2019 10:17 AM      Passed - Valid encounter within last 12 months    Recent Outpatient Visits           3 months ago Type 2 diabetes mellitus with other specified complication, without long-term current use of insulin [facility])   [facility] [place], Dionne Bucy, MD   4 months ago [facility] to [employer] preventive visit   [facility] [place], Dionne Bucy, MD   7 months ago Chronic cough   [facility], Dionne Bucy, MD   10 months ago Hypertension associated with diabetes [facility])   [place], Dionne Bucy, MD   11 months ago Suspected Covid-19 Virus Infection   [place], Clearnce Sorrel, [place]       Future Appointments             In 2 months [place], Dionne Bucy, MD [facility], [place]

## 2019-12-10 ENCOUNTER — Other Ambulatory Visit: Payer: Self-pay | Admitting: Family Medicine

## 2019-12-10 NOTE — Progress Notes (Signed)
PROVIDER NOTE: Information contained herein reflects review and annotations entered in association with encounter. Interpretation of such information and data should be left to medically-trained personnel. Information provided to patient can be located elsewhere in the medical record under "Patient Instructions". Document created using STT-dictation technology, any transcriptional errors that may result from process are unintentional.    Patient: Ashley Rodriguez  Service Category: E/M  Provider: Gaspar Cola, MD  DOB: 12-Apr-1951  DOS: 12/14/2019  Specialty: Interventional Pain Management  MRN: [id number]  Setting: Ambulatory outpatient  PCP: Virginia Crews, MD  Type: Established Patient    Referring Provider: Virginia Crews, MD  Location: Office  Delivery: Face-to-face     HPI  Reason for encounter: Ashley Rodriguez, a 69 y.o. year old female, is here today for evaluation and management of her Chronic pain syndrome [G89.4]. Ashley Rodriguez primary complain today is Back Pain (lower) Last encounter: Practice (08/07/2019). My last encounter with her was on 08/06/2019. Pertinent problems: Ashley Rodriguez has Psoriatic arthritis ([place]); Chronic low back pain (Primary Area of Pain) (Bilateral) (R>L); Chronic lower extremity pain (Secondary Area of Pain) (Bilateral) (R>L); Grade 1 Anterolisthesis of L5 over S1; L5 pars defect with spondylolisthesis (Bilateral); DDD (degenerative disc disease), lumbar; Lumbar facet syndrome (Bilateral) (R>L); Chronic pain syndrome; Chronic musculoskeletal pain; Neurogenic pain; Chronic sacroiliac joint pain (Bilateral) (R>L); Osteoarthritis of lumbar spine; Spondylosis without myelopathy or radiculopathy, lumbosacral region; Peripheral neuropathy; Generalized osteoarthritis of hand; Numbness and tingling of foot; Low back pain radiating to both legs; Abnormal EMG (chronic right lower lumbar polyradiculopathy); Chronic lower lumbar polyradiculopathy (Right); DDD (degenerative  disc disease), lumbosacral; Lumbar spondylosis w/ polyradiculopathy (Right); Lumbosacral radiculopathy (Right); Other intervertebral disc degeneration, lumbar region; Spondylolisthesis of lumbosacral region; Other spondylosis, sacral and sacrococcygeal region; Somatic dysfunction of sacroiliac joints (Bilateral); and Osteoarthritis of sacroiliac joints (Bilateral) on their pertinent problem list. Pain Assessment: Severity of Chronic pain is reported as a 9 /10. Location: Back Lower/back of bilateral legs. Onset: More than a month ago. Quality: Aching, Squeezing. Timing: Constant. Modifying factor(s): rest. Vitals:  height is '5\' 1"'$  (1.549 m) and weight is 143 lb (64.9 kg). Her temporal temperature is 97.4 F (36.3 C) (abnormal). Her blood pressure is 155/79 (abnormal) and her pulse is 61. Her respiration is 16 and oxygen saturation is 100%.   The patient comes into the clinic today indicating that she is having a flareup of her low back pain.  She indicates that her low back pain is worse than the lower extremity pain.  In terms of the legs, she describes a "pulling sensation" going all the way down to the back of the leg and stopping at the ankle.  She does refer having some tingling in the bottom of both of her feet and what could be an S1 dermatomal distribution.  Based on the patient's description of the lower extremity pain, I believe that this may be referred from the lower back.  The patient did have radiofrequency ablation of the lumbar facets, bilaterally, less than a year ago.  Physical exam today demonstrated decreased range of motion on hyperextension and rotation, but it did not exactly reproduced the patient's low back pain.  In fact, hyperextension and rotation triggered pain in the contralateral side.  Provocative Patrick maneuver was negative bilaterally for any type of hip arthropathy, but I did notice decreased range of motion of the left hip.  However, it was positive bilaterally for  sacroiliac joint arthralgia.  I will go ahead and schedule  the patient to return as soon as possible for a diagnostic bilateral sacroiliac joint block under fluoroscopic guidance.  The patient is rather anxious and would prefer to have it done with some sedation.  She indicated being afraid of jumping or moving.  She is also afraid of the procedure causing some pain.  Because I have some vacation coming up, I will schedule the patient with Dr. Gillis Santa.  I will follow up after the procedure.  Pharmacotherapy Assessment   Analgesic: No opioid analgesics from our practice.   Monitoring: [place] PMP: PDMP reviewed during this encounter.       Pharmacotherapy: No side-effects or adverse reactions reported. Compliance: No problems identified. Effectiveness: Clinically acceptable.  No notes on file  UDS: No results found for: SUMMARY   ROS  Constitutional: Denies any fever or chills Gastrointestinal: No reported hemesis, hematochezia, vomiting, or acute GI distress Musculoskeletal: Denies any acute onset joint swelling, redness, loss of ROM, or weakness Neurological: No reported episodes of acute onset apraxia, aphasia, dysarthria, agnosia, amnesia, paralysis, loss of coordination, or loss of consciousness  Medication Review  Turmeric, acetaminophen, amLODipine, atenolol, atorvastatin, betamethasone dipropionate, calcium carbonate, esomeprazole, etanercept, fexofenadine, fluticasone, gabapentin, losartan, meloxicam, montelukast, multivitamin with minerals, and valACYclovir  History Review  Allergy: Ashley Rodriguez is allergic to codeine and sulfa antibiotics. Drug: Ashley Rodriguez  reports no history of drug use. Alcohol:  reports current alcohol use of about 4.0 standard drinks of alcohol per week. Tobacco:  reports that she has never smoked. She has never used smokeless tobacco. Social: Ashley Rodriguez  reports that she has never smoked. She has never used smokeless tobacco. She reports current alcohol use  of about 4.0 standard drinks of alcohol per week. She reports that she does not use drugs. Medical:  has a past medical history of COVID-19 (12/15/2018), Flank lipoma (08/17/2016), Hypertension, Neck mass (04/07/2013), Psoriatic arthritis ([place]), Recurrent cellulitis, and T2DM (type 2 diabetes mellitus) ([place]) (10/01/2018). Surgical: Ashley Rodriguez  has a past surgical history that includes Abdominal hysterectomy (2000); lipoma removed (04/2012); Colonoscopy (2015); and Neck mass excision (04/28/14). Family: family history includes Healthy in her mother.  Laboratory Chemistry Profile   Renal Lab Results  Component Value Date   BUN 14 07/30/2019   CREATININE 0.51 (L) 07/30/2019   BCR 27 07/30/2019   GFRAA 116 07/30/2019   GFRNONAA 101 07/30/2019     Hepatic Lab Results  Component Value Date   AST 17 07/30/2019   ALT 22 07/30/2019   ALBUMIN 4.9 (H) 07/30/2019   ALKPHOS 88 07/30/2019     Electrolytes Lab Results  Component Value Date   NA 141 07/30/2019   K 4.0 07/30/2019   CL 102 07/30/2019   CALCIUM 9.5 07/30/2019   MG 2.1 01/03/2017     Bone Lab Results  Component Value Date   25OHVITD1 50 01/03/2017   25OHVITD2 <1.0 01/03/2017   25OHVITD3 50 01/03/2017     Inflammation (CRP: Acute Phase) (ESR: Chronic Phase) Lab Results  Component Value Date   CRP 0.8 01/03/2017   ESRSEDRATE 6 01/03/2017       Note: Above Lab results reviewed.  Recent Imaging Review  DG PAIN CLINIC C-ARM 1-60 MIN NO REPORT Fluoro was used, but no Radiologist interpretation will be provided.  Please refer to "NOTES" tab for provider progress note. Note: Reviewed        Physical Exam  General appearance: Well nourished, well developed, and well hydrated. In no apparent acute distress Mental status: Alert, oriented  x 3 (person, place, & time)       Respiratory: No evidence of acute respiratory distress Eyes: PERLA Vitals: BP (!) 155/79 (BP Location: Right Arm, Patient Position: Sitting, Cuff Size:  Normal)   Pulse 61   Temp (!) 97.4 F (36.3 C) (Temporal)   Resp 16   Ht '5\' 1"'$  (1.549 m)   Wt 143 lb (64.9 kg)   SpO2 100%   BMI 27.02 kg/m  BMI: Estimated body mass index is 27.02 kg/m as calculated from the following:   Height as of this encounter: '5\' 1"'$  (1.549 m).   Weight as of this encounter: 143 lb (64.9 kg). Ideal: Ideal body weight: 47.8 kg (105 lb 6.1 oz) Adjusted ideal body weight: 54.6 kg (120 lb 6.8 oz)  Assessment   Status Diagnosis  Controlled Worsening Worsening 1. Chronic pain syndrome   2. Chronic low back pain (Primary Area of Pain) (Bilateral) (R>L)   3. Chronic lower extremity pain (Secondary Area of Pain) (Bilateral) (R>L)   4. Grade 1 Anterolisthesis of L5 over S1   5. L5 pars defect with spondylolisthesis (Bilateral)   6. Lumbar facet syndrome (Bilateral) (R>L)   7. Chronic sacroiliac joint pain (Bilateral) (R>L)   8. Other spondylosis, sacral and sacrococcygeal region   9. Somatic dysfunction of sacroiliac joints (Bilateral)   10. Osteoarthritis of sacroiliac joints (Bilateral)      Updated Problems: Problem  Other Spondylosis, Sacral and Sacrococcygeal Region  Somatic dysfunction of sacroiliac joints (Bilateral)  Osteoarthritis of sacroiliac joints (Bilateral)  Other Intervertebral Disc Degeneration, Lumbar Region  Spondylolisthesis of Lumbosacral Region  Numbness and Tingling of Foot  Low Back Pain Radiating to Both Legs  Psoriatic Arthritis ([facility])  Acute Postoperative Pain (Resolved)    Plan of Care  Problem-specific:  No problem-specific Assessment & Plan notes found for this encounter.  Ashley Rodriguez has a current medication list which includes the following long-term medication(s): amlodipine, atenolol, atorvastatin, calcium carbonate, enbrel sureclick, esomeprazole, fexofenadine, fluticasone, gabapentin, losartan, and montelukast.  Pharmacotherapy (Medications Ordered): No orders of the defined types were placed in this  encounter.  Orders:  Orders Placed This Encounter  Procedures  . SACROILIAC JOINT INJECTION    Standing Status:   Future    Standing Expiration Date:   01/14/2020    Scheduling Instructions:     Side: Bilateral     Sedation: Patient's choice.     Timeframe: ASAP    Order Specific Question:   Where will this procedure be performed?    Answer:   [facility]   Follow-up plan:   Return for Procedure (w/ sedation): (B) SI BLK #1 with Dr. Gillis Santa..      Interventional management options: Planned, scheduled, and/or pending:   None at this time.    Considering:   Diagnostic bilateral SI joint block Possible bilateral SI joint RFA Diagnostic bilateral hip joint injection Possible bilateral femoral +obturator NB Possible bilateral femoral + obturator nerve RFA Diagnostic right L4-5 LESI #2   Palliative PRN treatment(s):   Palliativebilateral lumbar facet block#6(diagnostic lumbar facet blocks provide the patient with 90 to 100% relief of the pain, unfortunately they did not last.  The insurance company has denied radiofrequency ablation for some unknown reason.) Palliative right lumbar facet RFA #2 (last done on 07/21/2019) (100/100/75)  Palliative left lumbar facet RFA #2 (last done on 08/06/2019)  (100/100/75)     Recent Visits Date Type Provider Dept  09/17/19 Telemedicine Milinda Pointer, [facility]  Showing recent visits within past 90 days and meeting all other requirements Today's Visits Date Type Provider Dept  12/14/19 Office Visit Milinda Pointer, MD [facility]  Showing today's visits and meeting all other requirements Future Appointments No visits were found meeting these conditions. Showing future appointments within next 90 days and meeting all other requirements  I discussed the assessment and treatment plan with the patient. The patient was provided an opportunity to ask questions and all were answered. The patient  agreed with the plan and demonstrated an understanding of the instructions.  Patient advised to call back or seek an in-person evaluation if the symptoms or condition worsens.  Duration of encounter: 30 minutes.  Note by: Gaspar Cola, MD Date: 12/14/2019; Time: 8:42 AM

## 2019-12-14 ENCOUNTER — Other Ambulatory Visit: Payer: Self-pay

## 2019-12-14 ENCOUNTER — Encounter: Payer: Self-pay | Admitting: Pain Medicine

## 2019-12-14 ENCOUNTER — Ambulatory Visit: Payer: Medicare Other | Attending: Pain Medicine | Admitting: Pain Medicine

## 2019-12-14 VITALS — BP 155/79 | HR 61 | Temp 97.4°F | Resp 16 | Ht 61.0 in | Wt 143.0 lb

## 2019-12-14 DIAGNOSIS — M47818 Spondylosis without myelopathy or radiculopathy, sacral and sacrococcygeal region: Secondary | ICD-10-CM

## 2019-12-14 DIAGNOSIS — M4300 Spondylolysis, site unspecified: Secondary | ICD-10-CM

## 2019-12-14 DIAGNOSIS — M79604 Pain in right leg: Secondary | ICD-10-CM

## 2019-12-14 DIAGNOSIS — M9904 Segmental and somatic dysfunction of sacral region: Secondary | ICD-10-CM

## 2019-12-14 DIAGNOSIS — M533 Sacrococcygeal disorders, not elsewhere classified: Secondary | ICD-10-CM

## 2019-12-14 DIAGNOSIS — G8929 Other chronic pain: Secondary | ICD-10-CM

## 2019-12-14 DIAGNOSIS — M4310 Spondylolisthesis, site unspecified: Secondary | ICD-10-CM | POA: Diagnosis not present

## 2019-12-14 DIAGNOSIS — M47898 Other spondylosis, sacral and sacrococcygeal region: Secondary | ICD-10-CM

## 2019-12-14 DIAGNOSIS — M47816 Spondylosis without myelopathy or radiculopathy, lumbar region: Secondary | ICD-10-CM | POA: Diagnosis not present

## 2019-12-14 DIAGNOSIS — M5442 Lumbago with sciatica, left side: Secondary | ICD-10-CM | POA: Diagnosis not present

## 2019-12-14 DIAGNOSIS — G894 Chronic pain syndrome: Secondary | ICD-10-CM | POA: Diagnosis not present

## 2019-12-14 DIAGNOSIS — M5441 Lumbago with sciatica, right side: Secondary | ICD-10-CM

## 2019-12-14 DIAGNOSIS — M79605 Pain in left leg: Secondary | ICD-10-CM

## 2019-12-14 DIAGNOSIS — M461 Sacroiliitis, not elsewhere classified: Secondary | ICD-10-CM | POA: Insufficient documentation

## 2019-12-14 NOTE — Patient Instructions (Signed)
____________________________________________________________________________________________  Preparing for Procedure with Sedation  Procedure appointments are limited to planned procedures: . No Prescription Refills. . No disability issues will be discussed. . No medication changes will be discussed.  Instructions: . Oral Intake: Do not eat or drink anything for at least 8 hours prior to your procedure. (Exception: Blood Pressure Medication. See below.) . Transportation: Unless otherwise stated by your physician, you may drive yourself after the procedure. . Blood Pressure Medicine: Do not forget to take your blood pressure medicine with a sip of water the morning of the procedure. If your Diastolic (lower reading)is above 100 mmHg, elective cases will be cancelled/rescheduled. . Blood thinners: These will need to be stopped for procedures. Notify our staff if you are taking any blood thinners. Depending on which one you take, there will be specific instructions on how and when to stop it. . Diabetics on insulin: Notify the staff so that you can be scheduled 1st case in the morning. If your diabetes requires high dose insulin, take only  of your normal insulin dose the morning of the procedure and notify the staff that you have done so. . Preventing infections: Shower with an antibacterial soap the morning of your procedure. . Build-up your immune system: Take 1000 mg of Vitamin C with every meal (3 times a day) the day prior to your procedure. . Antibiotics: Inform the staff if you have a condition or reason that requires you to take antibiotics before dental procedures. . Pregnancy: If you are pregnant, call and cancel the procedure. . Sickness: If you have a cold, fever, or any active infections, call and cancel the procedure. . Arrival: You must be in the facility at least 30 minutes prior to your scheduled procedure. . Children: Do not bring children with you. . Dress appropriately:  Bring dark clothing that you would not mind if they get stained. . Valuables: Do not bring any jewelry or valuables.  Reasons to call and reschedule or cancel your procedure: (Following these recommendations will minimize the risk of a serious complication.) . Surgeries: Avoid having procedures within 2 weeks of any surgery. (Avoid for 2 weeks before or after any surgery). . Flu Shots: Avoid having procedures within 2 weeks of a flu shots or . (Avoid for 2 weeks before or after immunizations). . Barium: Avoid having a procedure within 7-10 days after having had a radiological study involving the use of radiological contrast. (Myelograms, Barium swallow or enema study). . Heart attacks: Avoid any elective procedures or surgeries for the initial 6 months after a "Myocardial Infarction" (Heart Attack). . Blood thinners: It is imperative that you stop these medications before procedures. Let us know if you if you take any blood thinner.  . Infection: Avoid procedures during or within two weeks of an infection (including chest colds or gastrointestinal problems). Symptoms associated with infections include: Localized redness, fever, chills, night sweats or profuse sweating, burning sensation when voiding, cough, congestion, stuffiness, runny nose, sore throat, diarrhea, nausea, vomiting, cold or Flu symptoms, recent or current infections. It is specially important if the infection is over the area that we intend to treat. . Heart and lung problems: Symptoms that may suggest an active cardiopulmonary problem include: cough, chest pain, breathing difficulties or shortness of breath, dizziness, ankle swelling, uncontrolled high or unusually low blood pressure, and/or palpitations. If you are experiencing any of these symptoms, cancel your procedure and contact your primary care physician for an evaluation.  Remember:  Regular Business hours are:    Monday to Thursday 8:00 AM to 4:00 PM  Provider's  Schedule: Jaiyanna Safran, MD:  Procedure days: Tuesday and Thursday 7:30 AM to 4:00 PM  Bilal Lateef, MD:  Procedure days: Monday and Wednesday 7:30 AM to 4:00 PM ____________________________________________________________________________________________    

## 2019-12-15 ENCOUNTER — Encounter: Payer: Self-pay | Admitting: Pain Medicine

## 2019-12-15 ENCOUNTER — Ambulatory Visit (HOSPITAL_BASED_OUTPATIENT_CLINIC_OR_DEPARTMENT_OTHER): Payer: Medicare Other | Admitting: Pain Medicine

## 2019-12-15 ENCOUNTER — Other Ambulatory Visit: Payer: Self-pay

## 2019-12-15 ENCOUNTER — Ambulatory Visit
Admission: RE | Admit: 2019-12-15 | Discharge: 2019-12-15 | Disposition: A | Discharge status: Home / Self Care | Payer: Medicare Other | Source: Ambulatory Visit | Attending: Pain Medicine | Admitting: Pain Medicine

## 2019-12-15 VITALS — BP 138/80 | HR 61 | Temp 98.4°F | Resp 15 | Ht 62.0 in | Wt 142.0 lb

## 2019-12-15 DIAGNOSIS — G8929 Other chronic pain: Secondary | ICD-10-CM | POA: Insufficient documentation

## 2019-12-15 DIAGNOSIS — M5441 Lumbago with sciatica, right side: Secondary | ICD-10-CM | POA: Diagnosis not present

## 2019-12-15 DIAGNOSIS — M47898 Other spondylosis, sacral and sacrococcygeal region: Secondary | ICD-10-CM | POA: Insufficient documentation

## 2019-12-15 DIAGNOSIS — M79604 Pain in right leg: Secondary | ICD-10-CM

## 2019-12-15 DIAGNOSIS — M9904 Segmental and somatic dysfunction of sacral region: Secondary | ICD-10-CM | POA: Diagnosis not present

## 2019-12-15 DIAGNOSIS — M5442 Lumbago with sciatica, left side: Secondary | ICD-10-CM

## 2019-12-15 DIAGNOSIS — M533 Sacrococcygeal disorders, not elsewhere classified: Secondary | ICD-10-CM

## 2019-12-15 DIAGNOSIS — M79605 Pain in left leg: Secondary | ICD-10-CM | POA: Insufficient documentation

## 2019-12-15 MED ORDER — ROPIVACAINE HCL 2 MG/ML IJ SOLN
9.0000 mL | Freq: Once | INTRAMUSCULAR | Status: AC
  Administered 2019-12-15: 9 mL via INTRA_ARTICULAR
  Filled 2019-12-15: qty 10

## 2019-12-15 MED ORDER — LIDOCAINE HCL 2 % IJ SOLN
20.0000 mL | Freq: Once | INTRAMUSCULAR | Status: AC
  Administered 2019-12-15: 400 mg
  Filled 2019-12-15: qty 20

## 2019-12-15 MED ORDER — FENTANYL CITRATE (PF) 100 MCG/2ML IJ SOLN
25.0000 ug | INTRAMUSCULAR | Status: DC | PRN
  Administered 2019-12-15: 50 ug via INTRAVENOUS
  Filled 2019-12-15: qty 2

## 2019-12-15 MED ORDER — METHYLPREDNISOLONE ACETATE 80 MG/ML IJ SUSP
80.0000 mg | Freq: Once | INTRAMUSCULAR | Status: AC
  Administered 2019-12-15: 80 mg via INTRA_ARTICULAR
  Filled 2019-12-15: qty 1

## 2019-12-15 MED ORDER — MIDAZOLAM HCL 5 MG/5ML IJ SOLN
1.0000 mg | INTRAMUSCULAR | Status: DC | PRN
  Administered 2019-12-15: 1 mg via INTRAVENOUS
  Filled 2019-12-15: qty 5

## 2019-12-15 MED ORDER — LACTATED RINGERS IV SOLN
1000.0000 mL | Freq: Once | INTRAVENOUS | Status: AC
  Administered 2019-12-15: 1000 mL via INTRAVENOUS

## 2019-12-15 NOTE — Patient Instructions (Signed)

## 2019-12-15 NOTE — Progress Notes (Signed)
Safety precautions to be maintained throughout the outpatient stay will include: orient to surroundings, keep bed in low position, maintain call bell within reach at all times, provide assistance with transfer out of bed and ambulation.  

## 2019-12-15 NOTE — Progress Notes (Signed)
PROVIDER NOTE: Information contained herein reflects review and annotations entered in association with encounter. Interpretation of such information and data should be left to medically-trained personnel. Information provided to patient can be located elsewhere in the medical record under "Patient Instructions". Document created using STT-dictation technology, any transcriptional errors that may result from process are unintentional.    Patient: Ashley Rodriguez  Service Category: Procedure  Provider: Gaspar Cola, MD  DOB: [birth year]  DOS: 12/15/2019  Location: [facility]  MRN: [id number]  Setting: Ambulatory - outpatient  Referring Provider: Virginia Crews, MD  Type: Established Patient  Specialty: Interventional Pain Management  PCP: Virginia Crews, MD   Primary Reason for Visit: Interventional Pain Management Treatment. CC: Back Pain  Procedure:          Anesthesia, Analgesia, Anxiolysis:  Type: Diagnostic Sacroiliac Joint Steroid Injection #1  Region: Superior Lumbosacral Region Level: PSIS (Posterior Superior Iliac Spine) Laterality: Bilateral  Type: Moderate (Conscious) Sedation combined with Local Anesthesia Indication(s): Analgesia and Anxiety Route: Intravenous (IV) IV Access: Secured Sedation: Meaningful verbal contact was maintained at all times during the procedure  Local Anesthetic: Lidocaine 1-2%  Position: Prone           Indications: 1. Chronic sacroiliac joint pain (Bilateral) (R>L)   2. Somatic dysfunction of sacroiliac joints (Bilateral)   3. Other spondylosis, sacral and sacrococcygeal region   4. Chronic low back pain (Primary Area of Pain) (Bilateral) (R>L)   5. Chronic lower extremity pain (Secondary Area of Pain) (Bilateral) (R>L)    Pain Score: Pre-procedure: 8 /10 Post-procedure: 0-No pain/10   Pre-op Assessment:  [name] is a 69 y.o. (year old), female patient, seen today for interventional treatment. She  has a past  surgical history that includes Abdominal hysterectomy (2000); lipoma removed (04/2012); Colonoscopy (2015); and Neck mass excision (04/28/14). [name] has a current medication list which includes the following prescription(s): acetaminophen, amlodipine, atenolol, atorvastatin, calcium carbonate, enbrel sureclick, esomeprazole, fexofenadine, fluticasone, gabapentin, losartan, meloxicam, montelukast, multivitamin with minerals, turmeric, valacyclovir, valacyclovir, and betamethasone dipropionate, and the following Facility-Administered Medications: fentanyl and midazolam. Her primarily concern today is the Back Pain  Initial Vital Signs:  Pulse/HCG Rate: 61ECG Heart Rate: (!) 58 Temp: 98.6 F (37 C) Resp: 12 BP: (!) 157/68 SpO2: 98 %  BMI: Estimated body mass index is 25.97 kg/m as calculated from the following:   Height as of this encounter: 5\' 2"  (1.575 m).   Weight as of this encounter: 142 lb (64.4 kg).  Risk Assessment: Allergies: Reviewed. She is allergic to codeine and sulfa antibiotics.  Allergy Precautions: None required Coagulopathies: Reviewed. None identified.  Blood-thinner therapy: None at this time Active Infection(s): Reviewed. None identified. [name] is afebrile  Site Confirmation: [name] was asked to confirm the procedure and laterality before marking the site Procedure checklist: Completed Consent: Before the procedure and under the influence of no sedative(s), amnesic(s), or anxiolytics, the patient was informed of the treatment options, risks and possible complications. To fulfill our ethical and legal obligations, as recommended by the American Medical Association's Code of Ethics, I have informed the patient of my clinical impression; the nature and purpose of the treatment or procedure; the risks, benefits, and possible complications of the intervention; the alternatives, including doing nothing; the risk(s) and benefit(s) of the alternative treatment(s) or  procedure(s); and the risk(s) and benefit(s) of doing nothing. The patient was provided information about the general risks and possible complications associated with the procedure. These may  include, but are not limited to: failure to achieve desired goals, infection, bleeding, organ or nerve damage, allergic reactions, paralysis, and death. In addition, the patient was informed of those risks and complications associated to the procedure, such as failure to decrease pain; infection; bleeding; organ or nerve damage with subsequent damage to sensory, motor, and/or autonomic systems, resulting in permanent pain, numbness, and/or weakness of one or several areas of the body; allergic reactions; (i.e.: anaphylactic reaction); and/or death. Furthermore, the patient was informed of those risks and complications associated with the medications. These include, but are not limited to: allergic reactions (i.e.: anaphylactic or anaphylactoid reaction(s)); adrenal axis suppression; blood sugar elevation that in diabetics may result in ketoacidosis or comma; water retention that in patients with history of congestive heart failure may result in shortness of breath, pulmonary edema, and decompensation with resultant heart failure; weight gain; swelling or edema; medication-induced neural toxicity; particulate matter embolism and blood vessel occlusion with resultant organ, and/or nervous system infarction; and/or aseptic necrosis of one or more joints. Finally, the patient was informed that Medicine is not an exact science; therefore, there is also the possibility of unforeseen or unpredictable risks and/or possible complications that may result in a catastrophic outcome. The patient indicated having understood very clearly. We have given the patient no guarantees and we have made no promises. Enough time was given to the patient to ask questions, all of which were answered to the patient's satisfaction. [name] has  indicated that she wanted to continue with the procedure. Attestation: I, the ordering provider, attest that I have discussed with the patient the benefits, risks, side-effects, alternatives, likelihood of achieving goals, and potential problems during recovery for the procedure that I have provided informed consent. Date  Time: 12/15/2019  8:27 AM  Pre-Procedure Preparation:  Monitoring: As per clinic protocol. Respiration, ETCO2, SpO2, BP, heart rate and rhythm monitor placed and checked for adequate function Safety Precautions: Patient was assessed for positional comfort and pressure points before starting the procedure. Time-out: I initiated and conducted the "Time-out" before starting the procedure, as per protocol. The patient was asked to participate by confirming the accuracy of the "Time Out" information. Verification of the correct person, site, and procedure were performed and confirmed by me, the nursing staff, and the patient. "Time-out" conducted as per Joint Commission's Universal Protocol (UP.01.01.01). Time: 0903  Description of Procedure:          Target Area: Superior, posterior, aspect of the sacroiliac fissure Approach: Posterior, paraspinal, ipsilateral approach. Area Prepped: Entire Lower Lumbosacral Region DuraPrep (Iodine Povacrylex [0.7% available iodine] and Isopropyl Alcohol, 74% w/w) Safety Precautions: Aspiration looking for blood return was conducted prior to all injections. At no point did we inject any substances, as a needle was being advanced. No attempts were made at seeking any paresthesias. Safe injection practices and needle disposal techniques used. Medications properly checked for expiration dates. SDV (single dose vial) medications used. Description of the Procedure: Protocol guidelines were followed. The patient was placed in position over the procedure table. The target area was identified and the area prepped in the usual manner. Skin & deeper tissues  infiltrated with local anesthetic. Appropriate amount of time allowed to pass for local anesthetics to take effect. The procedure needle was advanced under fluoroscopic guidance into the sacroiliac joint until a firm endpoint was obtained. Proper needle placement secured. Negative aspiration confirmed. Solution injected in intermittent fashion, asking for systemic symptoms every 0.5cc of injectate. The needles were then removed  and the area cleansed, making sure to leave some of the prepping solution back to take advantage of its long term bactericidal properties. Vitals:   12/15/19 0908 12/15/19 0918 12/15/19 0928 12/15/19 0938  BP: 123/77 (!) 155/79 (!) 151/81 138/80  Pulse:      Resp: 11 14 13 15   Temp:  98.3 F (36.8 C)  98.4 F (36.9 C)  SpO2: 92% 98% 98% 98%  Weight:      Height:        Start Time: 0903 hrs. End Time: 0908 hrs. Materials:  Needle(s) Type: Spinal Needle Gauge: 22G Length: 3.5-in Medication(s): Please see orders for medications and dosing details.  Imaging Guidance (Non-Spinal):          Type of Imaging Technique: Fluoroscopy Guidance (Non-Spinal) Indication(s): Assistance in needle guidance and placement for procedures requiring needle placement in or near specific anatomical locations not easily accessible without such assistance. Exposure Time: Please see nurses notes. Contrast: Before injecting any contrast, we confirmed that the patient did not have an allergy to iodine, shellfish, or radiological contrast. Once satisfactory needle placement was completed at the desired level, radiological contrast was injected. Contrast injected under live fluoroscopy. No contrast complications. See chart for type and volume of contrast used. Fluoroscopic Guidance: I was personally present during the use of fluoroscopy. "Tunnel Vision Technique" used to obtain the best possible view of the target area. Parallax error corrected before commencing the procedure.  "Direction-depth-direction" technique used to introduce the needle under continuous pulsed fluoroscopy. Once target was reached, antero-posterior, oblique, and lateral fluoroscopic projection used confirm needle placement in all planes. Images permanently stored in EMR. Interpretation: I personally interpreted the imaging intraoperatively. Adequate needle placement confirmed in multiple planes. Appropriate spread of contrast into desired area was observed. No evidence of afferent or efferent intravascular uptake. Permanent images saved into the patient's record.  Antibiotic Prophylaxis:   Anti-infectives (From admission, onward)   None     Indication(s): None identified  Post-operative Assessment:  Post-procedure Vital Signs:  Pulse/HCG Rate: 61(!) 55 Temp: 98.4 F (36.9 C) Resp: 15 BP: 138/80 SpO2: 98 %  EBL: None  Complications: No immediate post-treatment complications observed by team, or reported by patient.  Note: The patient tolerated the entire procedure well. A repeat set of vitals were taken after the procedure and the patient was kept under observation following institutional policy, for this type of procedure. Post-procedural neurological assessment was performed, showing return to baseline, prior to discharge. The patient was provided with post-procedure discharge instructions, including a section on how to identify potential problems. Should any problems arise concerning this procedure, the patient was given instructions to immediately contact us, at any time, without hesitation. In any case, we plan to contact the patient by telephone for a follow-up status report regarding this interventional procedure.  Comments:  No additional relevant information.  Plan of Care  Orders:  Orders Placed This Encounter  Procedures  . SACROILIAC JOINT INJECTION    Scheduling Instructions:     Side: Bilateral     Sedation: Patient's choice.     Timeframe: Today    Order Specific  Question:   Where will this procedure be performed?    Answer:   [facility]  . DG PAIN [facility] C-ARM 1-60 MIN NO REPORT    Intraoperative interpretation by procedural physician at [place].    Standing Status:   Standing    Number of Occurrences:   1    Order Specific  Question:   Reason for exam:    Answer:   Assistance in needle guidance and placement for procedures requiring needle placement in or near specific anatomical locations not easily accessible without such assistance.  . Informed Consent Details: Physician/Practitioner Attestation; Transcribe to consent form and obtain patient signature    Provider Attestation: I, Ripley Dossie Arbour, MD, (Pain Management Specialist), the physician/practitioner, attest that I have discussed with the patient the benefits, risks, side effects, alternatives, likelihood of achieving goals and potential problems during recovery for the procedure that I have provided informed consent.    Scheduling Instructions:     Procedure: Sacroiliac Joint Block     Indication/Reason: Chronic Low Back and Hip Pain secondary to Sacroiliac Joint Pain (Arthralgia/Arthropathy)     Nursing Order: Transcribe to consent form and obtain patient signature.     Note: Always confirm laterality of pain with Ms. Lilia Pro, before procedure.  . Provide equipment / supplies at bedside    Equipment required: Single use, disposable, "Block Tray"    Standing Status:   Standing    Number of Occurrences:   1    Order Specific Question:   Specify    Answer:   Block Tray   Chronic Opioid Analgesic:  No opioid analgesics from our practice.   Medications ordered for procedure: Meds ordered this encounter  Medications  . lidocaine (XYLOCAINE) 2 % (with pres) injection 400 mg  . lactated ringers infusion 1,000 mL  . midazolam (VERSED) 5 MG/5ML injection 1-2 mg    Make sure Flumazenil is available in the pyxis when using this medication. If oversedation occurs,  administer 0.2 mg IV over 15 sec. If after 45 sec no response, administer 0.2 mg again over 1 min; may repeat at 1 min intervals; not to exceed 4 doses (1 mg)  . fentaNYL (SUBLIMAZE) injection 25-50 mcg    Make sure Narcan is available in the pyxis when using this medication. In the event of respiratory depression (RR< 8/min): Titrate NARCAN (naloxone) in increments of 0.1 to 0.2 mg IV at 2-3 minute intervals, until desired degree of reversal.  . ropivacaine (PF) 2 mg/mL (0.2%) (NAROPIN) injection 9 mL  . methylPREDNISolone acetate (DEPO-MEDROL) injection 80 mg   Medications administered: We administered lidocaine, lactated ringers, midazolam, fentaNYL, ropivacaine (PF) 2 mg/mL (0.2%), and methylPREDNISolone acetate.  See the medical record for exact dosing, route, and time of administration.  Follow-up plan:   Return in about 2 weeks (around 12/29/2019) for (VV), (PP) Follow-up, PM on Proc-day.       Interventional management options: Planned, scheduled, and/or pending:   None at this time.    Considering:   Diagnostic bilateral SI joint block Possible bilateral SI joint RFA Diagnostic bilateral hip joint injection Possible bilateral femoral +obturator NB Possible bilateral femoral + obturator nerve RFA Diagnostic right L4-5 LESI #2   Palliative PRN treatment(s):   Palliativebilateral lumbar facet block#6(diagnostic lumbar facet blocks provide the patient with 90 to 100% relief of the pain, unfortunately they did not last.  The insurance company has denied radiofrequency ablation for some unknown reason.) Palliative right lumbar facet RFA #2 (last done on 07/21/2019) (100/100/75)  Palliative left lumbar facet RFA #2 (last done on 08/06/2019)  (100/100/75)     Recent Visits Date Type Provider Dept  12/14/19 Office Visit Milinda Pointer, MD [facility]  09/17/19 Telemedicine Milinda Pointer, MD [facility]  Showing recent visits within past 90 days  and meeting all other requirements Today's Visits Date  Type Provider Dept  12/15/19 Procedure visit Milinda Pointer, MD [facility]  Showing today's visits and meeting all other requirements Future Appointments Date Type Provider Dept  01/19/20 Appointment Milinda Pointer, MD [facility]  Showing future appointments within next 90 days and meeting all other requirements  Disposition: Discharge home  Discharge (Date  Time): 12/15/2019;   hrs.   Primary Care Physician: Virginia Crews, MD Location: [facility] Note by: Gaspar Cola, MD Date: 12/15/2019; Time: 10:34 AM  Disclaimer:  Medicine is not an exact science. The only guarantee in medicine is that nothing is guaranteed. It is important to note that the decision to proceed with this intervention was based on the information collected from the patient. The Data and conclusions were drawn from the patient's questionnaire, the interview, and the physical examination. Because the information was provided in large part by the patient, it cannot be guaranteed that it has not been purposely or unconsciously manipulated. Every effort has been made to obtain as much relevant data as possible for this evaluation. It is important to note that the conclusions that lead to this procedure are derived in large part from the available data. Always take into account that the treatment will also be dependent on availability of resources and existing treatment guidelines, considered by other Pain Management Practitioners as being common knowledge and practice, at the time of the intervention. For Medico-Legal purposes, it is also important to point out that variation in procedural techniques and pharmacological choices are the acceptable norm. The indications, contraindications, technique, and results of the above procedure should only be interpreted and judged by a Board-Certified Interventional  Pain Specialist with extensive familiarity and expertise in the same exact procedure and technique.

## 2019-12-16 ENCOUNTER — Telehealth: Payer: Self-pay

## 2019-12-16 NOTE — Telephone Encounter (Signed)
Post procedure phone call.  Patient states she is just a little sore.  Instructed to place heat today.

## 2020-01-09 IMAGING — CR DG LUMBAR SPINE COMPLETE W/ BEND
1 series · 7 of 7 positions shown · non-contrast
Comparison: 01/03/2017

CLINICAL DATA: Low back pain

EXAM:
LUMBAR SPINE - COMPLETE WITH BENDING VIEWS

[Series 1: dg lumbar spine complete w/bend 6+v · 0.14mm/px · 7 of 7 slices shown]
[im 1/7]
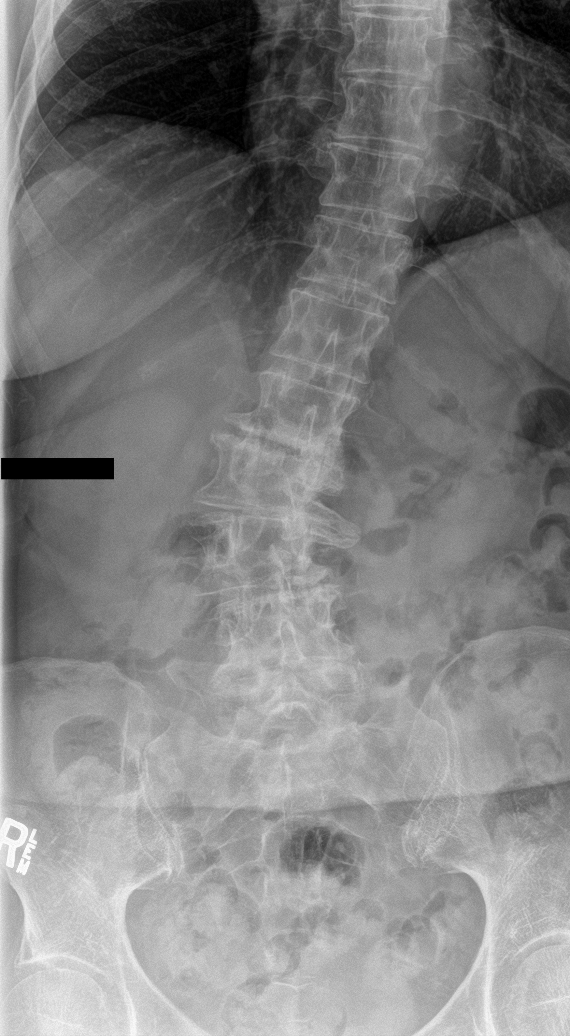
[im 2/7]
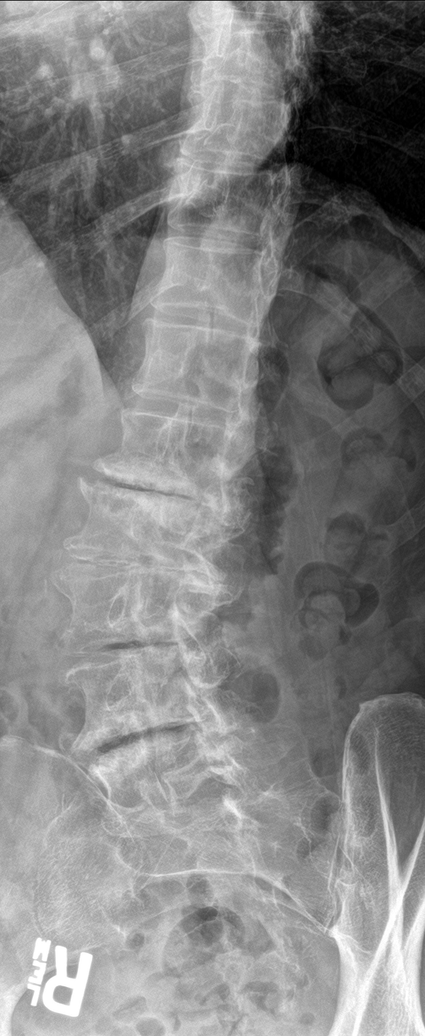
[im 3/7]
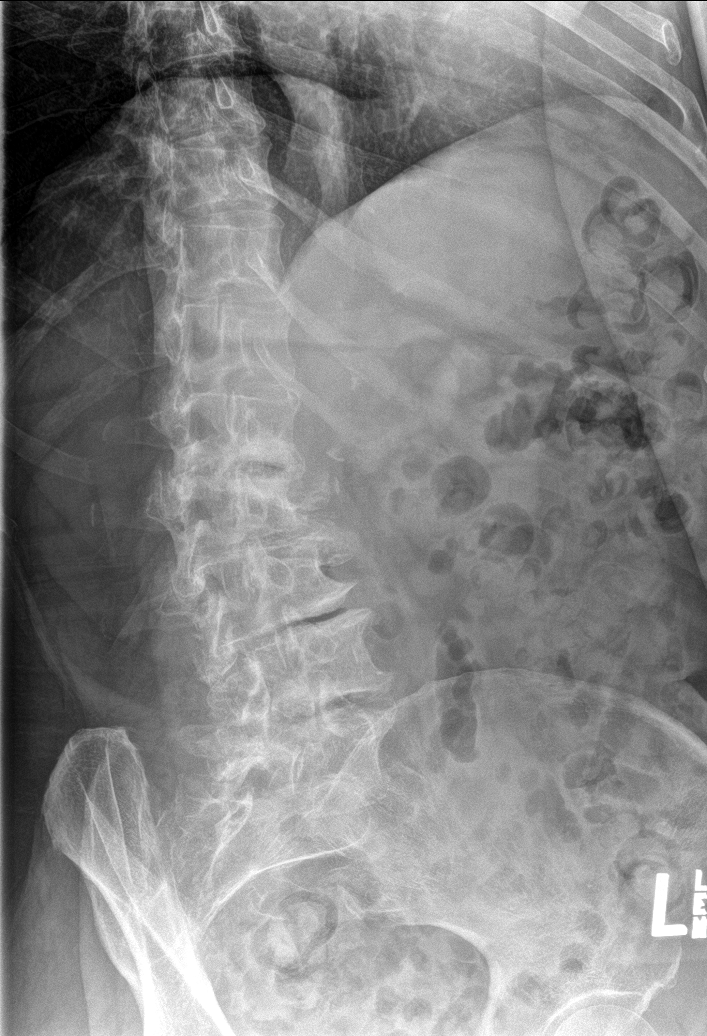
[im 4/7]
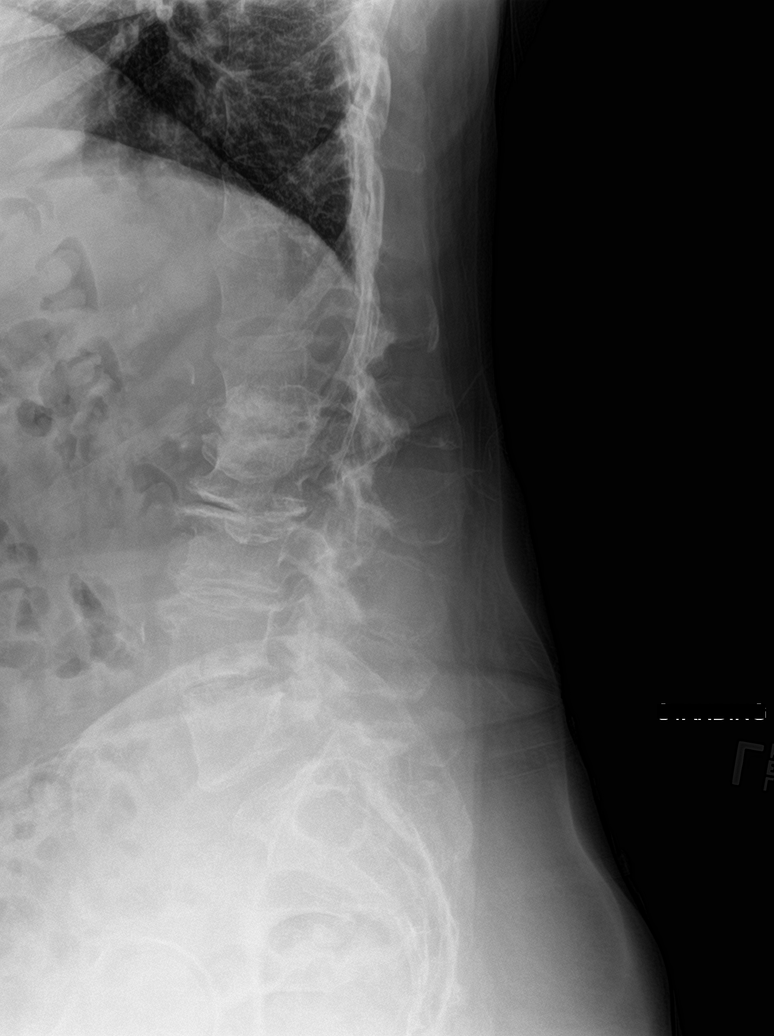
[im 5/7]
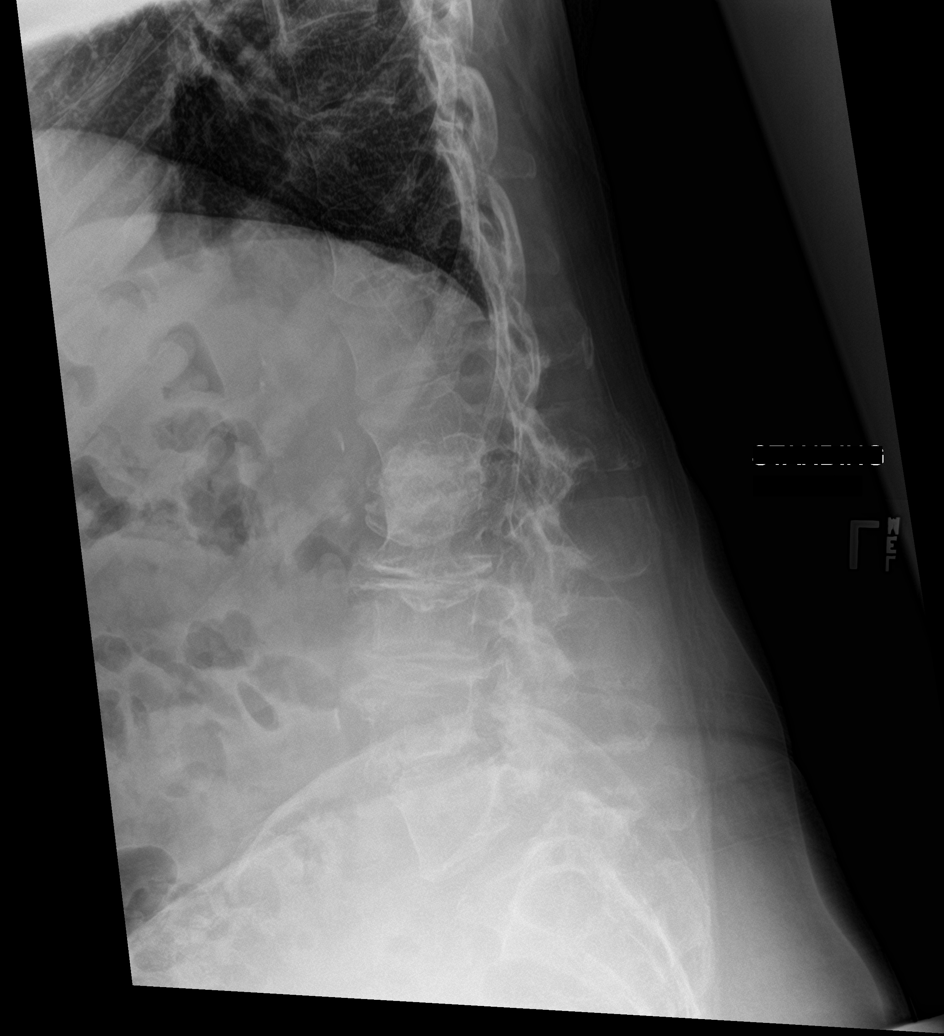
[im 6/7]
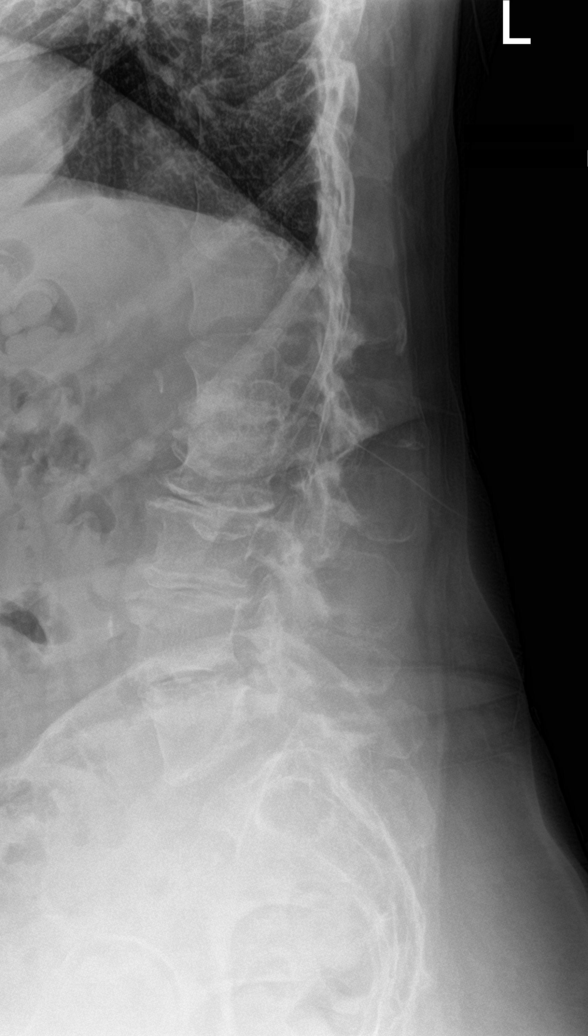
[im 7/7]
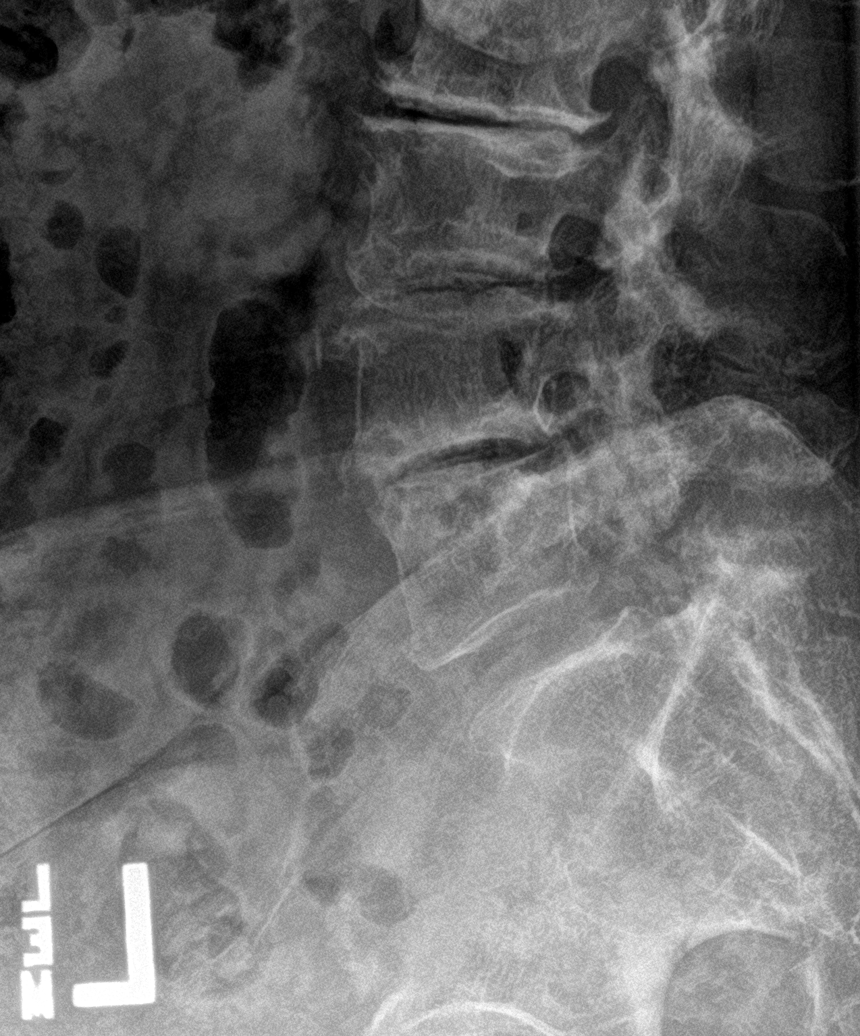

[7 of 7 positions shown; findings below may reference images not displayed]

FINDINGS: Moderate dextroscoliosis.  Negative for fracture

Multilevel degenerative change with disc space narrowing and
spurring throughout the lumbar spine. This most prominent on the
left at L1-2, L2-3, L3-4.

Grade 1 anterolisthesis L5-S1 unchanged from prior study. Bilateral
pars defects of L5. No abnormal movement on flexion or extension.
IMPRESSION: Scoliosis and multilevel degenerative change

Grade 1 anterolisthesis L5-S1 due to bilateral pars defects of L5.
No abnormal movement on flexion or extension.

## 2020-01-12 ENCOUNTER — Encounter: Payer: Self-pay | Admitting: Pain Medicine

## 2020-01-12 ENCOUNTER — Other Ambulatory Visit: Payer: Self-pay | Admitting: Family Medicine

## 2020-01-12 NOTE — Progress Notes (Signed)
Patient: Ashley Rodriguez  Service Category: E/M  Provider: Gaspar Cola, MD  DOB: March 15, 1951  DOS: 01/13/2020  Location: Office  MRN: [id number]  Setting: Ambulatory outpatient  Referring Provider: Virginia Crews, MD  Type: Established Patient  Specialty: Interventional Pain Management  PCP: Ashley Crews, MD  Location: Remote location  Delivery: TeleHealth     Virtual Encounter - Pain Management PROVIDER NOTE: Information contained herein reflects review and annotations entered in association with encounter. Interpretation of such information and data should be left to medically-trained personnel. Information provided to patient can be located elsewhere in the medical record under "Patient Instructions". Document created using STT-dictation technology, any transcriptional errors that may result from process are unintentional.    Contact & Pharmacy Preferred: [facility]: [phone] (home) Mobile: [phone] (mobile) E-mail: [email or web address] - [place], [place] - [place] [address] [place] [address] Phone: [phone] Fax: [phone]   Pre-screening  Ashley Rodriguez offered "in-person" vs "virtual" encounter. She indicated preferring virtual for this encounter.   Reason COVID-19*  Social distancing based on CDC and AMA recommendations.   I contacted Ashley Rodriguez on 01/13/2020 via telephone.      I clearly identified myself as Gaspar Cola, MD. I verified that I was speaking with the correct person using two identifiers (Name: Zella Dewan, and date of birth: August 27, 1950).  Consent I sought verbal advanced consent from Ashley Rodriguez for virtual visit interactions. I informed Ashley Rodriguez of possible security and privacy concerns, risks, and limitations associated with providing "not-in-person" medical evaluation and management services. I also informed Ashley Rodriguez of the availability of "in-person" appointments.  Finally, I informed her that there would be a charge for the virtual visit and that she could be  personally, fully or partially, financially responsible for it. Ashley Rodriguez expressed understanding and agreed to proceed.   Historic Elements   Ashley Rodriguez is a 69 y.o. year old, female patient evaluated today after our last contact on 12/15/2019. Ashley Rodriguez  has a past medical history of COVID-19 (12/15/2018), Flank lipoma (08/17/2016), Hypertension, Neck mass (04/07/2013), Psoriatic arthritis ([place]), Recurrent cellulitis, and T2DM (type 2 diabetes mellitus) ([place]) (10/01/2018). She also  has a past surgical history that includes Abdominal hysterectomy (2000); lipoma removed (04/2012); Colonoscopy (2015); and Neck mass excision (04/28/14). Ashley Rodriguez has a current medication list which includes the following prescription(s): acetaminophen, amlodipine, atenolol, atorvastatin, betamethasone dipropionate, calcium carbonate, enbrel sureclick, esomeprazole, fexofenadine, fluticasone, gabapentin, losartan, meloxicam, montelukast, multivitamin with minerals, turmeric, and valacyclovir. She  reports that she has never smoked. She has never used smokeless tobacco. She reports current alcohol use of about 4.0 standard drinks of alcohol per week. She reports that she does not use drugs. Ashley Rodriguez is allergic to codeine and sulfa antibiotics.   HPI  Today, she is being contacted for a post-procedure assessment.  The patient indicates having attained significant relief of the pain in the lower back, but the leg symptoms where she feels that it is a pulling sensation, that has not gone away.  This would suggest that that is completely unrelated to the SI joint.  However, the lower back did improve with the diagnostic injection and therefore we will go ahead and do the second diagnostic injection and if again effective, then we will proceed with radiofrequency ablation.  Post-Procedure Evaluation  Procedure (12/15/2019):  Diagnostic bilateral sacroiliac joint block #1 under fluoroscopic guidance and IV sedation Pre-procedure pain level: 8/10 Post-procedure: 0/10 (100% relief)  Sedation:  Sedation provided.  Effectiveness during initial hour after procedure(Ultra-Short Term Relief): 100 %.  Local anesthetic used: Long-acting (4-6 hours) Effectiveness: Defined as any analgesic benefit obtained secondary to the administration of local anesthetics. This carries significant diagnostic value as to the etiological location, or anatomical origin, of the pain. Duration of benefit is expected to coincide with the duration of the local anesthetic used.  Effectiveness during initial 4-6 hours after procedure(Short-Term Relief): 100 %.  Long-term benefit: Defined as any relief past the pharmacologic duration of the local anesthetics.  Effectiveness past the initial 6 hours after procedure(Long-Term Relief): 80 % (lasting a couple of weeks.).  Current benefits: Defined as benefit that persist at this time.   Analgesia:  >75% relief Function: Ashley Rodriguez reports improvement in function ROM: Ashley Rodriguez reports improvement in ROM  Pharmacotherapy Assessment  Analgesic: No opioid analgesics from our practice.   Monitoring: [place] PMP: PDMP reviewed during this encounter.       Pharmacotherapy: No side-effects or adverse reactions reported. Compliance: No problems identified. Effectiveness: Clinically acceptable. Plan: Refer to "POC".  UDS: No results found for: SUMMARY  Laboratory Chemistry Profile   Renal Lab Results  Component Value Date   BUN 14 07/30/2019   CREATININE 0.51 (L) 07/30/2019   BCR 27 07/30/2019   GFRAA 116 07/30/2019   GFRNONAA 101 07/30/2019     Hepatic Lab Results  Component Value Date   AST 17 07/30/2019   ALT 22 07/30/2019   ALBUMIN 4.9 (H) 07/30/2019   ALKPHOS 88 07/30/2019     Electrolytes Lab Results  Component Value Date   NA 141 07/30/2019   K 4.0 07/30/2019   CL 102 07/30/2019    CALCIUM 9.5 07/30/2019   MG 2.1 01/03/2017     Bone Lab Results  Component Value Date   25OHVITD1 50 01/03/2017   25OHVITD2 <1.0 01/03/2017   25OHVITD3 50 01/03/2017     Inflammation (CRP: Acute Phase) (ESR: Chronic Phase) Lab Results  Component Value Date   CRP 0.8 01/03/2017   ESRSEDRATE 6 01/03/2017       Note: Above Lab results reviewed.  Imaging  DG PAIN CLINIC C-ARM 1-60 MIN NO REPORT Fluoro was used, but no Radiologist interpretation will be provided.  Please refer to "NOTES" tab for provider progress note.  Assessment  The primary encounter diagnosis was Chronic sacroiliac joint pain (Bilateral) (R>L). Diagnoses of Somatic dysfunction of sacroiliac joints (Bilateral), Other spondylosis, sacral and sacrococcygeal region, Chronic low back pain (Primary Area of Pain) (Bilateral) (R>L), and Chronic lower extremity pain (Secondary Area of Pain) (Bilateral) (R>L) were also pertinent to this visit.  Plan of Care  Problem-specific:  No problem-specific Assessment & Plan notes found for this encounter.  Ms. Keyleen Cerrato has a current medication list which includes the following long-term medication(s): amlodipine, atenolol, atorvastatin, calcium carbonate, enbrel sureclick, esomeprazole, fexofenadine, fluticasone, gabapentin, losartan, and montelukast.  Pharmacotherapy (Medications Ordered): No orders of the defined types were placed in this encounter.  Orders:  Orders Placed This Encounter  Procedures  . SACROILIAC JOINT INJECTION    Standing Status:   Future    Standing Expiration Date:   02/12/2020    Scheduling Instructions:     Side: Bilateral     Sedation: Patient's choice.     Timeframe: ASAP    Order Specific Question:   Where will this procedure be performed?    Answer:   [facility]   Follow-up plan:   Return for Procedure (w/ sedation): (B)  SI BLK #2.      Interventional management options: Planned, scheduled, and/or pending:   None at  this time.    Considering:   Diagnostic bilateral SI joint block Possible bilateral SI joint RFA Diagnostic bilateral hip joint injection Possible bilateral femoral +obturator NB Possible bilateral femoral + obturator nerve RFA Diagnostic right L4-5 LESI #2   Palliative PRN treatment(s):   Palliativebilateral lumbar facet block#6(diagnostic lumbar facet blocks provide the patient with 90 to 100% relief of the pain, unfortunately they did not last.  The insurance company has denied radiofrequency ablation for some unknown reason.) Palliative right lumbar facet RFA #2 (last done on 07/21/2019) (100/100/75)  Palliative left lumbar facet RFA #2 (last done on 08/06/2019)  (100/100/75)  Diagnostic bilateral SI joint block #2 (last done 12/15/2019) (100/100/80)    Recent Visits Date Type Provider Dept  12/15/19 Procedure visit Milinda Pointer, MD [facility]  12/14/19 Office Visit Milinda Pointer, MD [facility]  Showing recent visits within past 90 days and meeting all other requirements Today's Visits Date Type Provider Dept  01/13/20 Telemedicine Milinda Pointer, MD [facility]  Showing today's visits and meeting all other requirements Future Appointments No visits were found meeting these conditions. Showing future appointments within next 90 days and meeting all other requirements  I discussed the assessment and treatment plan with the patient. The patient was provided an opportunity to ask questions and all were answered. The patient agreed with the plan and demonstrated an understanding of the instructions.  Patient advised to call back or seek an in-person evaluation if the symptoms or condition worsens.  Duration of encounter: 12 minutes.  Note by: Gaspar Cola, MD Date: 01/13/2020; Time: 5:02 PM

## 2020-01-13 ENCOUNTER — Other Ambulatory Visit: Payer: Self-pay

## 2020-01-13 ENCOUNTER — Ambulatory Visit: Payer: Medicare Other | Attending: Pain Medicine | Admitting: Pain Medicine

## 2020-01-13 DIAGNOSIS — M79605 Pain in left leg: Secondary | ICD-10-CM

## 2020-01-13 DIAGNOSIS — M5441 Lumbago with sciatica, right side: Secondary | ICD-10-CM

## 2020-01-13 DIAGNOSIS — M79604 Pain in right leg: Secondary | ICD-10-CM | POA: Diagnosis not present

## 2020-01-13 DIAGNOSIS — M9904 Segmental and somatic dysfunction of sacral region: Secondary | ICD-10-CM | POA: Diagnosis not present

## 2020-01-13 DIAGNOSIS — M5442 Lumbago with sciatica, left side: Secondary | ICD-10-CM

## 2020-01-13 DIAGNOSIS — M533 Sacrococcygeal disorders, not elsewhere classified: Secondary | ICD-10-CM

## 2020-01-13 DIAGNOSIS — G8929 Other chronic pain: Secondary | ICD-10-CM

## 2020-01-13 DIAGNOSIS — M47898 Other spondylosis, sacral and sacrococcygeal region: Secondary | ICD-10-CM | POA: Diagnosis not present

## 2020-01-13 NOTE — Patient Instructions (Signed)
____________________________________________________________________________________________  Preparing for Procedure with Sedation  Procedure appointments are limited to planned procedures: . No Prescription Refills. . No disability issues will be discussed. . No medication changes will be discussed.  Instructions: . Oral Intake: Do not eat or drink anything for at least 8 hours prior to your procedure. (Exception: Blood Pressure Medication. See below.) . Transportation: Unless otherwise stated by your physician, you may drive yourself after the procedure. . Blood Pressure Medicine: Do not forget to take your blood pressure medicine with a sip of water the morning of the procedure. If your Diastolic (lower reading)is above 100 mmHg, elective cases will be cancelled/rescheduled. . Blood thinners: These will need to be stopped for procedures. Notify our staff if you are taking any blood thinners. Depending on which one you take, there will be specific instructions on how and when to stop it. . Diabetics on insulin: Notify the staff so that you can be scheduled 1st case in the morning. If your diabetes requires high dose insulin, take only  of your normal insulin dose the morning of the procedure and notify the staff that you have done so. . Preventing infections: Shower with an antibacterial soap the morning of your procedure. . Build-up your immune system: Take 1000 mg of Vitamin C with every meal (3 times a day) the day prior to your procedure. . Antibiotics: Inform the staff if you have a condition or reason that requires you to take antibiotics before dental procedures. . Pregnancy: If you are pregnant, call and cancel the procedure. . Sickness: If you have a cold, fever, or any active infections, call and cancel the procedure. . Arrival: You must be in the facility at least 30 minutes prior to your scheduled procedure. . Children: Do not bring children with you. . Dress appropriately:  Bring dark clothing that you would not mind if they get stained. . Valuables: Do not bring any jewelry or valuables.  Reasons to call and reschedule or cancel your procedure: (Following these recommendations will minimize the risk of a serious complication.) . Surgeries: Avoid having procedures within 2 weeks of any surgery. (Avoid for 2 weeks before or after any surgery). . Flu Shots: Avoid having procedures within 2 weeks of a flu shots or . (Avoid for 2 weeks before or after immunizations). . Barium: Avoid having a procedure within 7-10 days after having had a radiological study involving the use of radiological contrast. (Myelograms, Barium swallow or enema study). . Heart attacks: Avoid any elective procedures or surgeries for the initial 6 months after a "Myocardial Infarction" (Heart Attack). . Blood thinners: It is imperative that you stop these medications before procedures. Let us know if you if you take any blood thinner.  . Infection: Avoid procedures during or within two weeks of an infection (including chest colds or gastrointestinal problems). Symptoms associated with infections include: Localized redness, fever, chills, night sweats or profuse sweating, burning sensation when voiding, cough, congestion, stuffiness, runny nose, sore throat, diarrhea, nausea, vomiting, cold or Flu symptoms, recent or current infections. It is specially important if the infection is over the area that we intend to treat. . Heart and lung problems: Symptoms that may suggest an active cardiopulmonary problem include: cough, chest pain, breathing difficulties or shortness of breath, dizziness, ankle swelling, uncontrolled high or unusually low blood pressure, and/or palpitations. If you are experiencing any of these symptoms, cancel your procedure and contact your primary care physician for an evaluation.  Remember:  Regular Business hours are:    Monday to Thursday 8:00 AM to 4:00 PM  Provider's  Schedule: Donie Lemelin, MD:  Procedure days: Tuesday and Thursday 7:30 AM to 4:00 PM  Bilal Lateef, MD:  Procedure days: Monday and Wednesday 7:30 AM to 4:00 PM ____________________________________________________________________________________________    

## 2020-01-19 ENCOUNTER — Telehealth: Payer: Medicare Other | Admitting: Pain Medicine

## 2020-01-23 ENCOUNTER — Other Ambulatory Visit: Payer: Self-pay | Admitting: Family Medicine

## 2020-01-23 NOTE — Telephone Encounter (Signed)
Requested Prescriptions  Pending Prescriptions Disp Refills  . losartan (COZAAR) 25 MG tablet [Pharmacy Med Name: LOSARTAN POTASSIUM 25 MG TAB] 15 tablet 0    Sig: TAKE 1/2 TABLET (12.5 MG TOTAL) BY MOUTHONCE DAILY     Cardiovascular:  Angiotensin Receptor Blockers Failed - 01/23/2020  9:18 AM      Failed - Cr in normal range and within 180 days    Creatinine  Date Value Ref Range Status  04/16/2014 0.68 0.60 - 1.30 mg/dL Final   Creatinine, Ser  Date Value Ref Range Status  07/30/2019 0.51 (L) 0.57 - 1.00 mg/dL Final   Creatinine, POC  Date Value Ref Range Status  01/01/2019 NA\ mg/dL Final         Passed - K in normal range and within 180 days    Potassium  Date Value Ref Range Status  07/30/2019 4.0 3.5 - 5.2 mmol/L Final         Passed - Patient is not pregnant      Passed - Last BP in normal range    BP Readings from Last 1 Encounters:  12/15/19 138/80         Passed - Valid encounter within last 6 months    Recent Outpatient Visits          5 months ago Type 2 diabetes mellitus with other specified complication, without long-term current use of insulin [facility])   [facility], Dionne Bucy, MD   6 months ago [facility] to [employer] preventive visit   [facility], Dionne Bucy, MD   9 months ago Chronic cough   [facility] [place], Dionne Bucy, MD   1 year ago Hypertension associated with diabetes [facility])   [facility] [place], Dionne Bucy, MD   1 year ago Suspected Covid-19 Virus Infection   [facility] [place], Clearnce Sorrel, [place]      Future Appointments            In 2 days [place], Dionne Bucy, MD [facility], [place]

## 2020-01-25 ENCOUNTER — Ambulatory Visit (INDEPENDENT_AMBULATORY_CARE_PROVIDER_SITE_OTHER): Payer: Medicare Other | Admitting: Family Medicine

## 2020-01-25 ENCOUNTER — Other Ambulatory Visit: Payer: Self-pay

## 2020-01-25 VITALS — BP 134/83 | HR 71 | Temp 98.4°F | Wt 142.0 lb

## 2020-01-25 DIAGNOSIS — E1169 Type 2 diabetes mellitus with other specified complication: Secondary | ICD-10-CM

## 2020-01-25 DIAGNOSIS — M791 Myalgia, unspecified site: Secondary | ICD-10-CM | POA: Insufficient documentation

## 2020-01-25 DIAGNOSIS — Z79899 Other long term (current) drug therapy: Secondary | ICD-10-CM

## 2020-01-25 DIAGNOSIS — Z23 Encounter for immunization: Secondary | ICD-10-CM

## 2020-01-25 DIAGNOSIS — I10 Essential (primary) hypertension: Secondary | ICD-10-CM

## 2020-01-25 DIAGNOSIS — L4050 Arthropathic psoriasis, unspecified: Secondary | ICD-10-CM

## 2020-01-25 DIAGNOSIS — Z1231 Encounter for screening mammogram for malignant neoplasm of breast: Secondary | ICD-10-CM

## 2020-01-25 DIAGNOSIS — E785 Hyperlipidemia, unspecified: Secondary | ICD-10-CM

## 2020-01-25 DIAGNOSIS — I152 Hypertension secondary to endocrine disorders: Secondary | ICD-10-CM

## 2020-01-25 DIAGNOSIS — E1159 Type 2 diabetes mellitus with other circulatory complications: Secondary | ICD-10-CM | POA: Diagnosis not present

## 2020-01-25 DIAGNOSIS — T466X5A Adverse effect of antihyperlipidemic and antiarteriosclerotic drugs, initial encounter: Secondary | ICD-10-CM

## 2020-01-25 LAB — POCT GLYCOSYLATED HEMOGLOBIN (HGB A1C): Hemoglobin A1C: 6.2 % — AB (ref 4.0–5.6)

## 2020-01-25 NOTE — Progress Notes (Signed)
Established patient visit   Patient: Ashley Rodriguez   DOB: 1951-04-04   69 y.o. Female  MRN: [id number] Visit Date: 01/25/2020  Today's healthcare provider: Lavon Paganini, MD   Chief Complaint  Patient presents with  . Diabetes  . Hyperlipidemia  . Hypertension   Subjective    HPI Diabetes Mellitus Type II, follow-up  Lab Results  Component Value Date   HGBA1C 6.2 (A) 01/25/2020   HGBA1C 5.9 (A) 07/30/2019   HGBA1C 6.4 (H) 01/01/2019   Last seen for diabetes 6 months ago.  Management since then includes continuing the same treatment. She reports excellent compliance with treatment. She is not having side effects.   Home blood sugar records: are not being checked  Episodes of hypoglycemia? No    Current insulin regiment: none Most Recent Eye Exam: UTD  --------------------------------------------------------------------------------------------------- Hypertension, follow-up  BP Readings from Last 3 Encounters:  01/25/20 134/83  12/15/19 138/80  12/14/19 (!) 155/79   Wt Readings from Last 3 Encounters:  01/25/20 142 lb (64.4 kg)  12/15/19 142 lb (64.4 kg)  12/14/19 143 lb (64.9 kg)     She was last seen for hypertension 6 months ago.   Management since that visit includes no changes. She reports excellent compliance with treatment. She is not having side effects.  She is exercising. She is adherent to low salt diet.   Outside blood pressures are not being checked.  She does not smoke.  Use of agents associated with hypertension: none.   --------------------------------------------------------------------------------------------------- Lipid/Cholesterol, follow-up  Last Lipid Panel: Lab Results  Component Value Date   CHOL 180 01/25/2020   LDLCALC 99 01/25/2020   HDL 54 01/25/2020   TRIG 157 (H) 01/25/2020    She was last seen for this 6 months ago.  Management since that visit includes no changes.  She reports poor compliance with  treatment. She is having side effects.  Pt reports having muscle aches with Atorvastatin.  She states she stop taking it last week.   Symptoms: No appetite changes No foot ulcerations  No chest pain No chest pressure/discomfort  No dyspnea No orthopnea  No fatigue No lower extremity edema  No palpitations No paroxysmal nocturnal dyspnea  No nausea Yes numbness or tingling of extremity  No polydipsia No polyuria  No speech difficulty No syncope   She is following a Regular diet. Current exercise: plays golf  Last metabolic panel Lab Results  Component Value Date   GLUCOSE 105 (H) 01/25/2020   NA 140 01/25/2020   K 4.4 01/25/2020   BUN 16 01/25/2020   CREATININE 0.64 01/25/2020   GFRNONAA 93 01/25/2020   GFRAA 107 01/25/2020   CALCIUM 10.1 01/25/2020   AST 24 01/25/2020   ALT 24 01/25/2020   The 10-year ASCVD risk score Mikey Bussing DC Jr., et al., 2013) is: 17.9%  ---------------------------------------------------------------------------------------------------   Patient Active Problem List   Diagnosis Date Noted  . Myalgia due to statin 01/25/2020  . Other spondylosis, sacral and sacrococcygeal region 12/14/2019  . Somatic dysfunction of sacroiliac joints (Bilateral) 12/14/2019  . Osteoarthritis of sacroiliac joints (Bilateral) 12/14/2019  . History of claustrophobia 04/20/2019  . Abnormal EMG (chronic right lower lumbar polyradiculopathy) 04/20/2019  . Chronic lower lumbar polyradiculopathy (Right) 04/20/2019  . DDD (degenerative disc disease), lumbosacral 04/20/2019  . Lumbar spondylosis w/ polyradiculopathy (Right) 04/20/2019  . Lumbosacral radiculopathy (Right) 04/20/2019  . Other intervertebral disc degeneration, lumbar region 04/20/2019  . Spondylolisthesis of lumbosacral region 04/20/2019  . Numbness and tingling  of foot 03/19/2019  . Low back pain radiating to both legs 03/19/2019  . Encounter for long-term (current) use of high-risk medication 10/23/2018  .  Generalized osteoarthritis of hand 10/23/2018  . T2DM (type 2 diabetes mellitus) ([place]) 10/01/2018  . Peripheral neuropathy 10/01/2018  . Hyperlipidemia associated with type 2 diabetes mellitus ([place]) 10/01/2018  . Hypertension associated with diabetes ([place]) 10/01/2018  . Spondylosis without myelopathy or radiculopathy, lumbosacral region 08/19/2017  . Osteoarthritis of lumbar spine 04/18/2017  . Grade 1 Anterolisthesis of L5 over S1 01/16/2017  . L5 pars defect with spondylolisthesis (Bilateral) 01/16/2017  . NSAID long-term use 01/16/2017  . Disorder of skeletal system 01/16/2017  . Pharmacologic therapy 01/16/2017  . Problems influencing health status 01/16/2017  . DDD (degenerative disc disease), lumbar 01/16/2017  . Lumbar facet syndrome (Bilateral) (R>L) 01/16/2017  . Numbness and tingling (lower extremities) (Bilateral) 01/16/2017  . Chronic pain syndrome 01/16/2017  . Chronic musculoskeletal pain 01/16/2017  . Neurogenic pain 01/16/2017  . Chronic sacroiliac joint pain (Bilateral) (R>L) 01/16/2017  . Chronic low back pain (Primary Area of Pain) (Bilateral) (R>L) 01/03/2017  . Chronic lower extremity pain (Secondary Area of Pain) (Bilateral) (R>L) 01/03/2017  . Psoriatic arthritis ([place]) 03/04/2013   Past Medical History:  Diagnosis Date  . COVID-19 12/15/2018  . Flank lipoma 08/17/2016  . Hypertension   . Neck mass 04/07/2013  . Psoriatic arthritis ([place])   . Recurrent cellulitis   . T2DM (type 2 diabetes mellitus) ([place]) 10/01/2018   Social History   Tobacco Use  . Smoking status: Never Smoker  . Smokeless tobacco: Never Used  Vaping Use  . Vaping Use: Never used  Substance Use Topics  . Alcohol use: Yes    Alcohol/week: 4.0 standard drinks    Types: 4 Standard drinks or equivalent per week  . Drug use: No   Allergies  Allergen Reactions  . Atorvastatin Other (See Comments)    Myalgias  . Codeine Rash  . Sulfa Antibiotics Rash     Medications: Outpatient  Medications Prior to Visit  Medication Sig  . co-enzyme Q-10 30 MG capsule Take 100 mg by mouth daily.  Marland Kitchen acetaminophen (TYLENOL) 325 MG tablet Take 325 mg by mouth every 6 (six) hours as needed.  Marland Kitchen amLODipine (NORVASC) 5 MG tablet TAKE 1 TABLET BY MOUTH ONCE A DAY AS NEEDED FOR BLOOD PRESSURE  . atenolol (TENORMIN) 50 MG tablet TAKE 1 TABLET BY MOUTH ONCE DAILY  . betamethasone dipropionate 0.05 % lotion Apply 1 application topically as needed.   . calcium carbonate (CALCIUM 600) 600 MG TABS tablet Take 600 mg by mouth daily with breakfast.  . ENBREL SURECLICK 50 MG/ML injection Inject 50 mg into the skin once a week.   . esomeprazole (NEXIUM) 40 MG capsule Take 1 capsule (40 mg total) by mouth daily.  . fexofenadine (ALLEGRA) 180 MG tablet Take 180 mg by mouth daily.  . fluticasone (FLONASE) 50 MCG/ACT nasal spray   . gabapentin (NEURONTIN) 300 MG capsule Take 1 capsule (300 mg total) by mouth 3 (three) times daily.  Marland Kitchen losartan (COZAAR) 25 MG tablet TAKE 1/2 TABLET (12.5 MG TOTAL) BY MOUTHONCE DAILY  . meloxicam (MOBIC) 15 MG tablet Take 15 mg by mouth daily.   . montelukast (SINGULAIR) 10 MG tablet Take 1 tablet (10 mg total) by mouth at bedtime.  . Multiple Vitamins-Minerals (MULTIVITAMIN WITH MINERALS) tablet Take 1 tablet by mouth daily.  . TURMERIC PO Take by mouth.  . valACYclovir (VALTREX) 1000 MG  tablet TAKE 2 TABLETS BY MOUTH TWICE DAILY FOR 1 DAY WHEN FEVER BLISTER OCCURS  . [DISCONTINUED] atorvastatin (LIPITOR) 20 MG tablet TAKE 1 TABLET BY MOUTH ONCE DAILY   No facility-administered medications prior to visit.    Review of Systems  Constitutional: Negative.   Respiratory: Negative.   Cardiovascular: Negative.   Neurological: Negative.   Psychiatric/Behavioral: Negative.       Objective    BP 134/83   Pulse 71   Temp 98.4 F (36.9 C) (Oral)   Wt 142 lb (64.4 kg)   BMI 25.97 kg/m    Physical Exam Vitals reviewed.  Constitutional:      General: She is not in  acute distress.    Appearance: Normal appearance. She is well-developed. She is not diaphoretic.  HENT:     Head: Normocephalic and atraumatic.  Eyes:     General: No scleral icterus.    Conjunctiva/sclera: Conjunctivae normal.  Neck:     Thyroid: No thyromegaly.  Cardiovascular:     Rate and Rhythm: Normal rate and regular rhythm.     Pulses: Normal pulses.     Heart sounds: Normal heart sounds. No murmur heard.   Pulmonary:     Effort: Pulmonary effort is normal. No respiratory distress.     Breath sounds: Normal breath sounds. No wheezing, rhonchi or rales.  Musculoskeletal:     Cervical back: Neck supple.     Right lower leg: No edema.     Left lower leg: No edema.  Lymphadenopathy:     Cervical: No cervical adenopathy.  Skin:    General: Skin is warm and dry.     Findings: No rash.  Neurological:     Mental Status: She is alert and oriented to person, place, and time. Mental status is at baseline.  Psychiatric:        Mood and Affect: Mood normal.        Behavior: Behavior normal.       Results for orders placed or performed in visit on 01/25/20  CBC w/Diff/Platelet  Result Value Ref Range   WBC 4.6 3.4 - 10.8 x10E3/uL   RBC 4.39 3.77 - 5.28 x10E6/uL   Hemoglobin 14.3 11.1 - 15.9 g/dL   Hematocrit 42.2 34.0 - 46.6 %   MCV 96 79 - 97 fL   MCH 32.6 26.6 - 33.0 pg   MCHC 33.9 31 - 35 g/dL   RDW 13.1 11.7 - 15.4 %   Platelets 355 150 - 450 x10E3/uL   Neutrophils 46 Not Estab. %   Lymphs 37 Not Estab. %   Monocytes 12 Not Estab. %   Eos 4 Not Estab. %   Basos 1 Not Estab. %   Neutrophils Absolute 2.1 1 - 7 x10E3/uL   Lymphocytes Absolute 1.7 0 - 3 x10E3/uL   Monocytes Absolute 0.6 0 - 0 x10E3/uL   EOS (ABSOLUTE) 0.2 0.0 - 0.4 x10E3/uL   Basophils Absolute 0.1 0 - 0 x10E3/uL   Immature Granulocytes 0 Not Estab. %   Immature Grans (Abs) 0.0 0.0 - 0.1 x10E3/uL  Sed Rate (ESR)  Result Value Ref Range   Sed Rate 2 0 - 40 mm/hr  Comprehensive metabolic panel   Result Value Ref Range   Glucose 105 (H) 65 - 99 mg/dL   BUN 16 8 - 27 mg/dL   Creatinine, Ser 0.64 0.57 - 1.00 mg/dL   GFR calc non Af Amer 93 >59 mL/min/1.73   GFR calc Af Amer 107 >59  mL/min/1.73   BUN/Creatinine Ratio 25 12 - 28   Sodium 140 134 - 144 mmol/L   Potassium 4.4 3.5 - 5.2 mmol/L   Chloride 100 96 - 106 mmol/L   CO2 25 20 - 29 mmol/L   Calcium 10.1 8.7 - 10.3 mg/dL   Total Protein 7.0 6.0 - 8.5 g/dL   Albumin 4.9 (H) 3.8 - 4.8 g/dL   Globulin, Total 2.1 1.5 - 4.5 g/dL   Albumin/Globulin Ratio 2.3 (H) 1.2 - 2.2   Bilirubin Total 0.5 0.0 - 1.2 mg/dL   Alkaline Phosphatase 74 44 - 121 IU/L   AST 24 0 - 40 IU/L   ALT 24 0 - 32 IU/L  Lipid panel  Result Value Ref Range   Cholesterol, Total 180 100 - 199 mg/dL   Triglycerides 157 (H) 0 - 149 mg/dL   HDL 54 >39 mg/dL   VLDL Cholesterol Cal 27 5 - 40 mg/dL   LDL Chol Calc (NIH) 99 0 - 99 mg/dL   Chol/HDL Ratio 3.3 0.0 - 4.4 ratio  POCT glycosylated hemoglobin (Hb A1C)  Result Value Ref Range   Hemoglobin A1C 6.2 (A) 4.0 - 5.6 %    Assessment & Plan     Problem List Items Addressed This Visit      Cardiovascular and Mediastinum   Hypertension associated with diabetes ([place])    Well controlled Continue current medications Recheck metabolic panel F/u in 6 months       Relevant Orders   Comprehensive metabolic panel (Completed)     Endocrine   T2DM (type 2 diabetes mellitus) ([place]) - Primary    Well controlled with A1c 6.2 Diet controlled Associated with HLD and HTN UTD on vaccines, eye exam foot exam completed today On ARB/Statin Discussed diet and exercise F/u in 6 months       Relevant Orders   POCT glycosylated hemoglobin (Hb A1C) (Completed)   Hyperlipidemia associated with type 2 diabetes mellitus (H[facility])    Myalgias due to statins Would like to hold all statins and not try Crestor Continue CoQ10 Recheck CMP and FLP      Relevant Orders   Comprehensive metabolic panel (Completed)   Lipid  panel (Completed)     Musculoskeletal and Integument   Psoriatic arthritis (H[facility]) (Chronic)    Looking for new rheumatologist Continue Enbrel Off of chronic prednisone Check labs today      Relevant Orders   CBC w/Diff/Platelet (Completed)   Sed Rate (ESR) (Completed)     Other   Myalgia due to statin    As above Prefers to hold off on any statins at this time Continue CoQ10 Consider Crestor Stop atorvastatin       Other Visit Diagnoses    Long-term use of high-risk medication       Relevant Orders   CBC w/Diff/Platelet (Completed)   Sed Rate (ESR) (Completed)   Comprehensive metabolic panel (Completed)   Breast cancer screening by mammogram       Relevant Orders   MM 3D SCREEN BREAST BILATERAL       Return in about 6 months (around 07/24/2020) for CPE, AWV.      I, Lavon Paganini, MD, have reviewed all documentation for this visit. The documentation on 01/26/20 for the exam, diagnosis, procedures, and orders are all accurate and complete.   Suhail Peloquin, Dionne Bucy, MD, MPH [facility]

## 2020-01-25 NOTE — Assessment & Plan Note (Signed)
Well controlled with A1c 6.2 Diet controlled Associated with HLD and HTN UTD on vaccines, eye exam foot exam completed today On ARB/Statin Discussed diet and exercise F/u in 6 months

## 2020-01-25 NOTE — Assessment & Plan Note (Signed)
Looking for new rheumatologist Continue Enbrel Off of chronic prednisone Check labs today

## 2020-01-25 NOTE — Assessment & Plan Note (Signed)
Well controlled Continue current medications Recheck metabolic panel F/u in 6 months  

## 2020-01-25 NOTE — Assessment & Plan Note (Signed)
Myalgias due to statins Would like to hold all statins and not try Crestor Continue CoQ10 Recheck CMP and FLP

## 2020-01-25 NOTE — Assessment & Plan Note (Signed)
As above Prefers to hold off on any statins at this time Continue CoQ10 Consider Crestor Stop atorvastatin

## 2020-01-26 ENCOUNTER — Other Ambulatory Visit: Payer: Self-pay | Admitting: Podiatry

## 2020-01-26 ENCOUNTER — Other Ambulatory Visit: Payer: Self-pay | Admitting: Family Medicine

## 2020-01-26 LAB — CBC WITH DIFFERENTIAL/PLATELET
Basophils Absolute: 0.1 10*3/uL (ref 0.0–0.2)
Basos: 1 %
EOS (ABSOLUTE): 0.2 10*3/uL (ref 0.0–0.4)
Eos: 4 %
Hematocrit: 42.2 % (ref 34.0–46.6)
Hemoglobin: 14.3 g/dL (ref 11.1–15.9)
Immature Grans (Abs): 0 10*3/uL (ref 0.0–0.1)
Immature Granulocytes: 0 %
Lymphocytes Absolute: 1.7 10*3/uL (ref 0.7–3.1)
Lymphs: 37 %
MCH: 32.6 pg (ref 26.6–33.0)
MCHC: 33.9 g/dL (ref 31.5–35.7)
MCV: 96 fL (ref 79–97)
Monocytes Absolute: 0.6 10*3/uL (ref 0.1–0.9)
Monocytes: 12 %
Neutrophils Absolute: 2.1 10*3/uL (ref 1.4–7.0)
Neutrophils: 46 %
Platelets: 355 10*3/uL (ref 150–450)
RBC: 4.39 x10E6/uL (ref 3.77–5.28)
RDW: 13.1 % (ref 11.7–15.4)
WBC: 4.6 10*3/uL (ref 3.4–10.8)

## 2020-01-26 LAB — COMPREHENSIVE METABOLIC PANEL
ALT: 24 IU/L (ref 0–32)
AST: 24 IU/L (ref 0–40)
Albumin/Globulin Ratio: 2.3 — ABNORMAL HIGH (ref 1.2–2.2)
Albumin: 4.9 g/dL — ABNORMAL HIGH (ref 3.8–4.8)
Alkaline Phosphatase: 74 IU/L (ref 44–121)
BUN/Creatinine Ratio: 25 (ref 12–28)
BUN: 16 mg/dL (ref 8–27)
Bilirubin Total: 0.5 mg/dL (ref 0.0–1.2)
CO2: 25 mmol/L (ref 20–29)
Calcium: 10.1 mg/dL (ref 8.7–10.3)
Chloride: 100 mmol/L (ref 96–106)
Creatinine, Ser: 0.64 mg/dL (ref 0.57–1.00)
GFR calc Af Amer: 107 mL/min/{1.73_m2} (ref >59)
GFR calc non Af Amer: 93 mL/min/{1.73_m2} (ref >59)
Globulin, Total: 2.1 g/dL (ref 1.5–4.5)
Glucose: 105 mg/dL — ABNORMAL HIGH (ref 65–99)
Potassium: 4.4 mmol/L (ref 3.5–5.2)
Sodium: 140 mmol/L (ref 134–144)
Total Protein: 7 g/dL (ref 6.0–8.5)

## 2020-01-26 LAB — SEDIMENTATION RATE: Sed Rate: 2 mm/hr (ref 0–40)

## 2020-01-26 LAB — LIPID PANEL
Chol/HDL Ratio: 3.3 ratio (ref 0.0–4.4)
Cholesterol, Total: 180 mg/dL (ref 100–199)
HDL: 54 mg/dL (ref >39)
LDL Chol Calc (NIH): 99 mg/dL (ref 0–99)
Triglycerides: 157 mg/dL — ABNORMAL HIGH (ref 0–149)
VLDL Cholesterol Cal: 27 mg/dL (ref 5–40)

## 2020-01-26 NOTE — Addendum Note (Signed)
Addended by: Ashley Royalty E on: 01/26/2020 09:35 AM   Modules accepted: Orders

## 2020-01-26 NOTE — Telephone Encounter (Signed)
Please advise 

## 2020-01-27 ENCOUNTER — Telehealth: Payer: Self-pay

## 2020-01-27 NOTE — Telephone Encounter (Signed)
Pt advised.   Thanks,   -Larone Kliethermes  

## 2020-01-27 NOTE — Telephone Encounter (Signed)
-----   Message from Virginia Crews, MD sent at 01/26/2020  2:05 PM EDT ----- Normal labs, except cholesterol is not at goal.  We will discuss recommendation for Statin again at next visit.

## 2020-02-02 ENCOUNTER — Ambulatory Visit: Payer: Self-pay | Admitting: Family Medicine

## 2020-02-03 ENCOUNTER — Other Ambulatory Visit: Payer: Self-pay | Admitting: Family Medicine

## 2020-02-08 NOTE — Progress Notes (Signed)
PROVIDER NOTE: Information contained herein reflects review and annotations entered in association with encounter. Interpretation of such information and data should be left to medically-trained personnel. Information provided to patient can be located elsewhere in the medical record under "Patient Instructions". Document created using STT-dictation technology, any transcriptional errors that may result from process are unintentional.    Patient: Ashley Rodriguez  Service Category: Procedure  Provider: Gaspar Cola, MD  DOB: 15-Jun-1950  DOS: 02/09/2020  Location: [facility]  MRN: [id number]  Setting: Ambulatory - outpatient  Referring Provider: Milinda Pointer, MD  Type: Established Patient  Specialty: Interventional Pain Management  PCP: Virginia Crews, MD   Primary Reason for Visit: Interventional Pain Management Treatment. CC: Back Pain (lower)  Procedure:          Anesthesia, Analgesia, Anxiolysis:  Type: Diagnostic Sacroiliac Joint Steroid Injection #2  Region: Superior Lumbosacral Region Level: PSIS (Posterior Superior Iliac Spine) Laterality: Bilateral  Type: Moderate (Conscious) Sedation combined with Local Anesthesia Indication(s): Analgesia and Anxiety Route: Intravenous (IV) IV Access: Secured Sedation: Meaningful verbal contact was maintained at all times during the procedure  Local Anesthetic: Lidocaine 1-2%  Position: Prone           Indications: 1. Chronic sacroiliac joint pain (Bilateral) (R>L)   2. Somatic dysfunction of sacroiliac joints (Bilateral)   3. Other spondylosis, sacral and sacrococcygeal region    Pain Score: Pre-procedure: 8 /10 Post-procedure: 2  (when standing)/10   Pre-op Assessment:  Ashley Rodriguez is a 69 y.o. (year old), female patient, seen today for interventional treatment. She  has a past surgical history that includes Abdominal hysterectomy (2000); lipoma removed (04/2012); Colonoscopy (2015); and Neck mass excision  (04/28/14). Ashley Rodriguez has a current medication list which includes the following prescription(s): acetaminophen, amlodipine, atenolol, betamethasone dipropionate, calcium carbonate, co-enzyme P-53, enbrel sureclick, esomeprazole, fexofenadine, fluticasone, gabapentin, losartan, meloxicam, montelukast, multivitamin with minerals, turmeric, and valacyclovir, and the following Facility-Administered Medications: fentanyl and midazolam. Her primarily concern today is the Back Pain (lower)  Initial Vital Signs:  Pulse/HCG Rate: 61ECG Heart Rate: (!) 58 Temp: (!) 97 F (36.1 C) Resp: 16 BP: 135/77 SpO2: 98 %  BMI: Estimated body mass index is 26.4 kg/m as calculated from the following:   Height as of this encounter: 5' 1.5" (1.562 m).   Weight as of this encounter: 142 lb (64.4 kg).  Risk Assessment: Allergies: Reviewed. She is allergic to atorvastatin, codeine, and sulfa antibiotics.  Allergy Precautions: None required Coagulopathies: Reviewed. None identified.  Blood-thinner therapy: None at this time Active Infection(s): Reviewed. None identified. Ashley Rodriguez is afebrile  Site Confirmation: Ashley Rodriguez was asked to confirm the procedure and laterality before marking the site Procedure checklist: Completed Consent: Before the procedure and under the influence of no sedative(s), amnesic(s), or anxiolytics, the patient was informed of the treatment options, risks and possible complications. To fulfill our ethical and legal obligations, as recommended by the American Medical Association's Code of Ethics, I have informed the patient of my clinical impression; the nature and purpose of the treatment or procedure; the risks, benefits, and possible complications of the intervention; the alternatives, including doing nothing; the risk(s) and benefit(s) of the alternative treatment(s) or procedure(s); and the risk(s) and benefit(s) of doing nothing. The patient was provided information about the general  risks and possible complications associated with the procedure. These may include, but are not limited to: failure to achieve desired goals, infection, bleeding, organ or nerve damage, allergic reactions, paralysis, and death.  In addition, the patient was informed of those risks and complications associated to the procedure, such as failure to decrease pain; infection; bleeding; organ or nerve damage with subsequent damage to sensory, motor, and/or autonomic systems, resulting in permanent pain, numbness, and/or weakness of one or several areas of the body; allergic reactions; (i.e.: anaphylactic reaction); and/or death. Furthermore, the patient was informed of those risks and complications associated with the medications. These include, but are not limited to: allergic reactions (i.e.: anaphylactic or anaphylactoid reaction(s)); adrenal axis suppression; blood sugar elevation that in diabetics may result in ketoacidosis or comma; water retention that in patients with history of congestive heart failure may result in shortness of breath, pulmonary edema, and decompensation with resultant heart failure; weight gain; swelling or edema; medication-induced neural toxicity; particulate matter embolism and blood vessel occlusion with resultant organ, and/or nervous system infarction; and/or aseptic necrosis of one or more joints. Finally, the patient was informed that Medicine is not an exact science; therefore, there is also the possibility of unforeseen or unpredictable risks and/or possible complications that may result in a catastrophic outcome. The patient indicated having understood very clearly. We have given the patient no guarantees and we have made no promises. Enough time was given to the patient to ask questions, all of which were answered to the patient's satisfaction. Ashley Rodriguez has indicated that she wanted to continue with the procedure. Attestation: I, the ordering provider, attest that I have  discussed with the patient the benefits, risks, side-effects, alternatives, likelihood of achieving goals, and potential problems during recovery for the procedure that I have provided informed consent. Date  Time: 02/09/2020  8:11 AM  Pre-Procedure Preparation:  Monitoring: As per clinic protocol. Respiration, ETCO2, SpO2, BP, heart rate and rhythm monitor placed and checked for adequate function Safety Precautions: Patient was assessed for positional comfort and pressure points before starting the procedure. Time-out: I initiated and conducted the "Time-out" before starting the procedure, as per protocol. The patient was asked to participate by confirming the accuracy of the "Time Out" information. Verification of the correct person, site, and procedure were performed and confirmed by me, the nursing staff, and the patient. "Time-out" conducted as per Joint Commission's Universal Protocol (UP.01.01.01). Time: 4268  Description of Procedure:          Target Area: Superior, posterior, aspect of the sacroiliac fissure Approach: Posterior, paraspinal, ipsilateral approach. Area Prepped: Entire Lower Lumbosacral Region DuraPrep (Iodine Povacrylex [0.7% available iodine] and Isopropyl Alcohol, 74% w/w) Safety Precautions: Aspiration looking for blood return was conducted prior to all injections. At no point did we inject any substances, as a needle was being advanced. No attempts were made at seeking any paresthesias. Safe injection practices and needle disposal techniques used. Medications properly checked for expiration dates. SDV (single dose vial) medications used. Description of the Procedure: Protocol guidelines were followed. The patient was placed in position over the procedure table. The target area was identified and the area prepped in the usual manner. Skin & deeper tissues infiltrated with local anesthetic. Appropriate amount of time allowed to pass for local anesthetics to take effect. The  procedure needle was advanced under fluoroscopic guidance into the sacroiliac joint until a firm endpoint was obtained. Proper needle placement secured. Negative aspiration confirmed. Solution injected in intermittent fashion, asking for systemic symptoms every 0.5cc of injectate. The needles were then removed and the area cleansed, making sure to leave some of the prepping solution back to take advantage of its Rodriguez term bactericidal  properties. Vitals:   02/09/20 0849 02/09/20 0859 02/09/20 0909 02/09/20 0917  BP: 102/60 (!) 108/58 (!) 101/57 117/60  Pulse:   (!) 52 (!) 55  Resp: (!) 9 11 15 16   Temp:  (!) 97.1 F (36.2 C)    TempSrc:  Tympanic    SpO2: 96% 95% 95% 96%  Weight:      Height:        Start Time: 0844 hrs. End Time: 0849 hrs. Materials:  Needle(s) Type: Spinal Needle Gauge: 22G Length: 3.5-in Medication(s): Please see orders for medications and dosing details.  Imaging Guidance (Non-Spinal):          Type of Imaging Technique: Fluoroscopy Guidance (Non-Spinal) Indication(s): Assistance in needle guidance and placement for procedures requiring needle placement in or near specific anatomical locations not easily accessible without such assistance. Exposure Time: Please see nurses notes. Contrast: Before injecting any contrast, we confirmed that the patient did not have an allergy to iodine, shellfish, or radiological contrast. Once satisfactory needle placement was completed at the desired level, radiological contrast was injected. Contrast injected under live fluoroscopy. No contrast complications. See chart for type and volume of contrast used. Fluoroscopic Guidance: I was personally present during the use of fluoroscopy. "Tunnel Vision Technique" used to obtain the best possible view of the target area. Parallax error corrected before commencing the procedure. "Direction-depth-direction" technique used to introduce the needle under continuous pulsed fluoroscopy. Once target  was reached, antero-posterior, oblique, and lateral fluoroscopic projection used confirm needle placement in all planes. Images permanently stored in EMR. Interpretation: I personally interpreted the imaging intraoperatively. Adequate needle placement confirmed in multiple planes. Appropriate spread of contrast into desired area was observed. No evidence of afferent or efferent intravascular uptake. Permanent images saved into the patient's record.  Antibiotic Prophylaxis:   Anti-infectives (From admission, onward)   None     Indication(s): None identified  Post-operative Assessment:  Post-procedure Vital Signs:  Pulse/HCG Rate: (!) 55 (sb)(!) 56 (sb) Temp: (!) 97.1 F (36.2 C) Resp: 16 BP: 117/60 SpO2: 96 %  EBL: None  Complications: No immediate post-treatment complications observed by team, or reported by patient.  Note: The patient tolerated the entire procedure well. A repeat set of vitals were taken after the procedure and the patient was kept under observation following institutional policy, for this type of procedure. Post-procedural neurological assessment was performed, showing return to baseline, prior to discharge. The patient was provided with post-procedure discharge instructions, including a section on how to identify potential problems. Should any problems arise concerning this procedure, the patient was given instructions to immediately contact us, at any time, without hesitation. In any case, we plan to contact the patient by telephone for a follow-up status report regarding this interventional procedure.  Comments:  No additional relevant information.  Plan of Care  Orders:  Orders Placed This Encounter  Procedures  . SACROILIAC JOINT INJECTION    Scheduling Instructions:     Side: Bilateral     Sedation: Patient's choice.     Timeframe: Today    Order Specific Question:   Where will this procedure be performed?    Answer:   [facility]  . DG PAIN  CLINIC C-ARM 1-60 MIN NO REPORT    Intraoperative interpretation by procedural physician at [place].    Standing Status:   Standing    Number of Occurrences:   1    Order Specific Question:   Reason for exam:    Answer:  Assistance in needle guidance and placement for procedures requiring needle placement in or near specific anatomical locations not easily accessible without such assistance.  . Informed Consent Details: Physician/Practitioner Attestation; Transcribe to consent form and obtain patient signature    Provider Attestation: I, Grafton Dossie Arbour, MD, (Pain Management Specialist), the physician/practitioner, attest that I have discussed with the patient the benefits, risks, side effects, alternatives, likelihood of achieving goals and potential problems during recovery for the procedure that I have provided informed consent.    Scheduling Instructions:     Nursing Order: Transcribe to consent form and obtain patient signature.     Note: Always confirm laterality of pain with Ms. Lilia Pro, before procedure.    Order Specific Question:   Physician/Practitioner attestation of informed consent for procedure/surgical case    Answer:   I, the physician/practitioner, attest that I have discussed with the patient the benefits, risks, side effects, alternatives, likelihood of achieving goals and potential problems during recovery for the procedure that I have provided informed consent.    Order Specific Question:   Procedure    Answer:   Sacroiliac Joint Block    Order Specific Question:   Physician/Practitioner performing the procedure    Answer:   Rowdy Guerrini A. Dossie Arbour MD    Order Specific Question:   Indication/Reason    Answer:   Chronic Low Back and Hip Pain secondary to Sacroiliac Joint Pain (Arthralgia/Arthropathy)  . Provide equipment / supplies at bedside    "Block Tray" (Disposable  single use) Needle type: Spinal Amount/quantity: 2 Size: Medium (5-inch) Gauge: 22G     Standing Status:   Standing    Number of Occurrences:   1    Order Specific Question:   Specify    Answer:   Block Tray   Chronic Opioid Analgesic:  No opioid analgesics from our practice.   Medications ordered for procedure: Meds ordered this encounter  Medications  . lidocaine (XYLOCAINE) 2 % (with pres) injection 400 mg  . lactated ringers infusion 1,000 mL  . midazolam (VERSED) 5 MG/5ML injection 1-2 mg    Make sure Flumazenil is available in the pyxis when using this medication. If oversedation occurs, administer 0.2 mg IV over 15 sec. If after 45 sec no response, administer 0.2 mg again over 1 min; may repeat at 1 min intervals; not to exceed 4 doses (1 mg)  . fentaNYL (SUBLIMAZE) injection 25-50 mcg    Make sure Narcan is available in the pyxis when using this medication. In the event of respiratory depression (RR< 8/min): Titrate NARCAN (naloxone) in increments of 0.1 to 0.2 mg IV at 2-3 minute intervals, until desired degree of reversal.  . methylPREDNISolone acetate (DEPO-MEDROL) injection 80 mg  . ropivacaine (PF) 2 mg/mL (0.2%) (NAROPIN) injection 9 mL   Medications administered: We administered lidocaine, lactated ringers, midazolam, fentaNYL, methylPREDNISolone acetate, and ropivacaine (PF) 2 mg/mL (0.2%).  See the medical record for exact dosing, route, and time of administration.  Follow-up plan:   Return in about 2 weeks (around 02/23/2020) for (F2F), (PP) Follow-up.       Interventional management options: Planned, scheduled, and/or pending:   None at this time.    Considering:   Diagnostic bilateral SI joint block Possible bilateral SI joint RFA Diagnostic bilateral hip joint injection Possible bilateral femoral +obturator NB Possible bilateral femoral + obturator nerve RFA Diagnostic right L4-5 LESI #2   Palliative PRN treatment(s):   Palliativebilateral lumbar facet block#6(diagnostic lumbar facet blocks provide the patient with 90  to 100%  relief of the pain, unfortunately they did not last.  The insurance company has denied radiofrequency ablation for some unknown reason.) Palliative right lumbar facet RFA #2 (last done on 07/21/2019) (100/100/75)  Palliative left lumbar facet RFA #2 (last done on 08/06/2019)  (100/100/75)  Diagnostic bilateral SI joint block #2 (last done 12/15/2019) (100/100/80)     Recent Visits Date Type Provider Dept  01/13/20 Telemedicine Milinda Pointer, MD [facility]  12/15/19 Procedure visit Milinda Pointer, MD [facility]  12/14/19 Office Visit Milinda Pointer, MD [facility]  Showing recent visits within past 90 days and meeting all other requirements Today's Visits Date Type Provider Dept  02/09/20 Procedure visit Milinda Pointer, MD [facility]  Showing today's visits and meeting all other requirements Future Appointments Date Type Provider Dept  02/29/20 Appointment Milinda Pointer, MD [facility]  Showing future appointments within next 90 days and meeting all other requirements  Disposition: Discharge home  Discharge (Date  Time): 02/09/2020; 0918 hrs.   Primary Care Physician: Virginia Crews, MD Location: [facility] Note by: Gaspar Cola, MD Date: 02/09/2020; Time: 10:12 AM  Disclaimer:  Medicine is not an Chief Strategy Officer. The only guarantee in medicine is that nothing is guaranteed. It is important to note that the decision to proceed with this intervention was based on the information collected from the patient. The Data and conclusions were drawn from the patient's questionnaire, the interview, and the physical examination. Because the information was provided in large part by the patient, it cannot be guaranteed that it has not been purposely or unconsciously manipulated. Every effort has been made to obtain as much relevant data as possible for this evaluation. It is important to  note that the conclusions that lead to this procedure are derived in large part from the available data. Always take into account that the treatment will also be dependent on availability of resources and existing treatment guidelines, considered by other Pain Management Practitioners as being common knowledge and practice, at the time of the intervention. For Medico-Legal purposes, it is also important to point out that variation in procedural techniques and pharmacological choices are the acceptable norm. The indications, contraindications, technique, and results of the above procedure should only be interpreted and judged by a Board-Certified Interventional Pain Specialist with extensive familiarity and expertise in the same exact procedure and technique.

## 2020-02-09 ENCOUNTER — Ambulatory Visit (HOSPITAL_BASED_OUTPATIENT_CLINIC_OR_DEPARTMENT_OTHER): Payer: Medicare Other | Admitting: Pain Medicine

## 2020-02-09 ENCOUNTER — Other Ambulatory Visit: Payer: Self-pay

## 2020-02-09 ENCOUNTER — Encounter: Payer: Self-pay | Admitting: Pain Medicine

## 2020-02-09 ENCOUNTER — Ambulatory Visit
Admission: RE | Admit: 2020-02-09 | Discharge: 2020-02-09 | Disposition: A | Discharge status: Home / Self Care | Payer: Medicare Other | Source: Ambulatory Visit | Attending: Pain Medicine | Admitting: Pain Medicine

## 2020-02-09 VITALS — BP 117/60 | HR 55 | Temp 97.1°F | Resp 16 | Ht 61.5 in | Wt 142.0 lb

## 2020-02-09 DIAGNOSIS — M5441 Lumbago with sciatica, right side: Secondary | ICD-10-CM | POA: Diagnosis not present

## 2020-02-09 DIAGNOSIS — G8929 Other chronic pain: Secondary | ICD-10-CM

## 2020-02-09 DIAGNOSIS — M533 Sacrococcygeal disorders, not elsewhere classified: Secondary | ICD-10-CM

## 2020-02-09 DIAGNOSIS — M9904 Segmental and somatic dysfunction of sacral region: Secondary | ICD-10-CM | POA: Diagnosis not present

## 2020-02-09 DIAGNOSIS — M5442 Lumbago with sciatica, left side: Secondary | ICD-10-CM | POA: Insufficient documentation

## 2020-02-09 DIAGNOSIS — M47898 Other spondylosis, sacral and sacrococcygeal region: Secondary | ICD-10-CM | POA: Insufficient documentation

## 2020-02-09 MED ORDER — METHYLPREDNISOLONE ACETATE 80 MG/ML IJ SUSP
80.0000 mg | Freq: Once | INTRAMUSCULAR | Status: AC
  Administered 2020-02-09: 80 mg via INTRA_ARTICULAR
  Filled 2020-02-09: qty 1

## 2020-02-09 MED ORDER — FENTANYL CITRATE (PF) 100 MCG/2ML IJ SOLN
25.0000 ug | INTRAMUSCULAR | Status: DC | PRN
  Administered 2020-02-09: 50 ug via INTRAVENOUS
  Filled 2020-02-09: qty 2

## 2020-02-09 MED ORDER — LACTATED RINGERS IV SOLN
1000.0000 mL | Freq: Once | INTRAVENOUS | Status: AC
  Administered 2020-02-09: 1000 mL via INTRAVENOUS

## 2020-02-09 MED ORDER — MIDAZOLAM HCL 5 MG/5ML IJ SOLN
1.0000 mg | INTRAMUSCULAR | Status: DC | PRN
  Administered 2020-02-09: 2 mg via INTRAVENOUS
  Filled 2020-02-09: qty 5

## 2020-02-09 MED ORDER — LIDOCAINE HCL 2 % IJ SOLN
20.0000 mL | Freq: Once | INTRAMUSCULAR | Status: AC
  Administered 2020-02-09: 200 mg
  Filled 2020-02-09: qty 20

## 2020-02-09 MED ORDER — ROPIVACAINE HCL 2 MG/ML IJ SOLN
9.0000 mL | Freq: Once | INTRAMUSCULAR | Status: AC
  Administered 2020-02-09: 9 mL via INTRA_ARTICULAR
  Filled 2020-02-09: qty 10

## 2020-02-09 NOTE — Progress Notes (Signed)
Safety precautions to be maintained throughout the outpatient stay will include: orient to surroundings, keep bed in low position, maintain call bell within reach at all times, provide assistance with transfer out of bed and ambulation.  

## 2020-02-09 NOTE — Patient Instructions (Signed)

## 2020-02-10 ENCOUNTER — Telehealth: Payer: Self-pay

## 2020-02-10 NOTE — Telephone Encounter (Signed)
Called pp,patient states she had a rough night and didn't sleep well. She does states she is doing some better. Instructed to use heat today and wait for the steroid to start working.

## 2020-02-24 ENCOUNTER — Ambulatory Visit: Payer: Medicare Other | Admitting: Pain Medicine

## 2020-02-29 ENCOUNTER — Encounter: Payer: Self-pay | Admitting: Pain Medicine

## 2020-02-29 ENCOUNTER — Ambulatory Visit: Payer: Medicare Other | Attending: Pain Medicine | Admitting: Pain Medicine

## 2020-02-29 ENCOUNTER — Other Ambulatory Visit: Payer: Self-pay

## 2020-02-29 VITALS — BP 147/93 | HR 62 | Temp 97.1°F | Ht 62.0 in | Wt 142.0 lb

## 2020-02-29 DIAGNOSIS — M79605 Pain in left leg: Secondary | ICD-10-CM | POA: Diagnosis not present

## 2020-02-29 DIAGNOSIS — M533 Sacrococcygeal disorders, not elsewhere classified: Secondary | ICD-10-CM

## 2020-02-29 DIAGNOSIS — G894 Chronic pain syndrome: Secondary | ICD-10-CM | POA: Diagnosis not present

## 2020-02-29 DIAGNOSIS — M79604 Pain in right leg: Secondary | ICD-10-CM | POA: Insufficient documentation

## 2020-02-29 DIAGNOSIS — M5442 Lumbago with sciatica, left side: Secondary | ICD-10-CM

## 2020-02-29 DIAGNOSIS — D1801 Hemangioma of skin and subcutaneous tissue: Secondary | ICD-10-CM | POA: Diagnosis not present

## 2020-02-29 DIAGNOSIS — Z1283 Encounter for screening for malignant neoplasm of skin: Secondary | ICD-10-CM | POA: Diagnosis not present

## 2020-02-29 DIAGNOSIS — L812 Freckles: Secondary | ICD-10-CM | POA: Diagnosis not present

## 2020-02-29 DIAGNOSIS — M5441 Lumbago with sciatica, right side: Secondary | ICD-10-CM | POA: Insufficient documentation

## 2020-02-29 DIAGNOSIS — G8929 Other chronic pain: Secondary | ICD-10-CM

## 2020-02-29 DIAGNOSIS — Z808 Family history of malignant neoplasm of other organs or systems: Secondary | ICD-10-CM | POA: Diagnosis not present

## 2020-02-29 DIAGNOSIS — L570 Actinic keratosis: Secondary | ICD-10-CM | POA: Diagnosis not present

## 2020-02-29 NOTE — Progress Notes (Signed)
Safety precautions to be maintained throughout the outpatient stay will include: orient to surroundings, keep bed in low position, maintain call bell within reach at all times, provide assistance with transfer out of bed and ambulation.  

## 2020-02-29 NOTE — Patient Instructions (Signed)
____________________________________________________________________________________________  Preparing for Procedure with Sedation  Procedure appointments are limited to planned procedures: . No Prescription Refills. . No disability issues will be discussed. . No medication changes will be discussed.  Instructions: . Oral Intake: Do not eat or drink anything for at least 8 hours prior to your procedure. (Exception: Blood Pressure Medication. See below.) . Transportation: Unless otherwise stated by your physician, you may drive yourself after the procedure. . Blood Pressure Medicine: Do not forget to take your blood pressure medicine with a sip of water the morning of the procedure. If your Diastolic (lower reading)is above 100 mmHg, elective cases will be cancelled/rescheduled. . Blood thinners: These will need to be stopped for procedures. Notify our staff if you are taking any blood thinners. Depending on which one you take, there will be specific instructions on how and when to stop it. . Diabetics on insulin: Notify the staff so that you can be scheduled 1st case in the morning. If your diabetes requires high dose insulin, take only  of your normal insulin dose the morning of the procedure and notify the staff that you have done so. . Preventing infections: Shower with an antibacterial soap the morning of your procedure. . Build-up your immune system: Take 1000 mg of Vitamin C with every meal (3 times a day) the day prior to your procedure. . Antibiotics: Inform the staff if you have a condition or reason that requires you to take antibiotics before dental procedures. . Pregnancy: If you are pregnant, call and cancel the procedure. . Sickness: If you have a cold, fever, or any active infections, call and cancel the procedure. . Arrival: You must be in the facility at least 30 minutes prior to your scheduled procedure. . Children: Do not bring children with you. . Dress appropriately:  Bring dark clothing that you would not mind if they get stained. . Valuables: Do not bring any jewelry or valuables.  Reasons to call and reschedule or cancel your procedure: (Following these recommendations will minimize the risk of a serious complication.) . Surgeries: Avoid having procedures within 2 weeks of any surgery. (Avoid for 2 weeks before or after any surgery). . Flu Shots: Avoid having procedures within 2 weeks of a flu shots or . (Avoid for 2 weeks before or after immunizations). . Barium: Avoid having a procedure within 7-10 days after having had a radiological study involving the use of radiological contrast. (Myelograms, Barium swallow or enema study). . Heart attacks: Avoid any elective procedures or surgeries for the initial 6 months after a "Myocardial Infarction" (Heart Attack). . Blood thinners: It is imperative that you stop these medications before procedures. Let us know if you if you take any blood thinner.  . Infection: Avoid procedures during or within two weeks of an infection (including chest colds or gastrointestinal problems). Symptoms associated with infections include: Localized redness, fever, chills, night sweats or profuse sweating, burning sensation when voiding, cough, congestion, stuffiness, runny nose, sore throat, diarrhea, nausea, vomiting, cold or Flu symptoms, recent or current infections. It is specially important if the infection is over the area that we intend to treat. . Heart and lung problems: Symptoms that may suggest an active cardiopulmonary problem include: cough, chest pain, breathing difficulties or shortness of breath, dizziness, ankle swelling, uncontrolled high or unusually low blood pressure, and/or palpitations. If you are experiencing any of these symptoms, cancel your procedure and contact your primary care physician for an evaluation.  Remember:  Regular Business hours are:    Monday to Thursday 8:00 AM to 4:00 PM  Provider's  Schedule: Shaneeka Scarboro, MD:  Procedure days: Tuesday and Thursday 7:30 AM to 4:00 PM  Bilal Lateef, MD:  Procedure days: Monday and Wednesday 7:30 AM to 4:00 PM ____________________________________________________________________________________________    

## 2020-02-29 NOTE — Progress Notes (Signed)
PROVIDER NOTE: Information contained herein reflects review and annotations entered in association with encounter. Interpretation of such information and data should be left to medically-trained personnel. Information provided to patient can be located elsewhere in the medical record under "Patient Instructions". Document created using STT-dictation technology, any transcriptional errors that may result from process are unintentional.    Patient: Ashley Rodriguez  Service Category: E/M  Provider: Gaspar Cola, MD  DOB: 01-Sep-1950  DOS: 02/29/2020  Specialty: Interventional Pain Management  MRN: [id number]  Setting: Ambulatory outpatient  PCP: Virginia Crews, MD  Type: Established Patient    Referring Provider: Virginia Crews, MD  Location: Office  Delivery: Face-to-face     HPI  [name], a 69 y.o. year old female, is here today because of her Chronic pain syndrome [G89.4]. [name] primary complain today is Back Pain Last encounter: My last encounter with her was on 02/09/2020. Pertinent problems: [name] has Psoriatic arthritis ([place]); Chronic low back pain (Primary Area of Pain) (Bilateral) (R>L); Chronic lower extremity pain (Secondary Area of Pain) (Bilateral) (R>L); Grade 1 Anterolisthesis of L5 over S1; L5 pars defect with spondylolisthesis (Bilateral); DDD (degenerative disc disease), lumbar; Lumbar facet syndrome (Bilateral) (R>L); Chronic pain syndrome; Chronic musculoskeletal pain; Neurogenic pain; Chronic sacroiliac joint pain (Bilateral) (R>L); Osteoarthritis of lumbar spine; Spondylosis without myelopathy or radiculopathy, lumbosacral region; Peripheral neuropathy; Generalized osteoarthritis of hand; Numbness and tingling of foot; Low back pain radiating to both legs; Abnormal EMG (chronic right lower lumbar polyradiculopathy); Chronic lower lumbar polyradiculopathy (Right); DDD (degenerative disc disease), lumbosacral; Lumbar spondylosis w/ polyradiculopathy  (Right); Lumbosacral radiculopathy (Right); Other intervertebral disc degeneration, lumbar region; Spondylolisthesis of lumbosacral region; Other spondylosis, sacral and sacrococcygeal region; Somatic dysfunction of sacroiliac joints (Bilateral); and Osteoarthritis of sacroiliac joints (Bilateral) on their pertinent problem list. Pain Assessment: Severity of Chronic pain is reported as a 6 /10. Location: Back Lower/pain radiaties down both leg, down back of thigh to her calf. Onset: More than a month ago. Quality: Tightness, Pressure. Timing: Constant. Modifying factor(s): moving, meds. Vitals:  height is _0  (1.575 m) and weight is 142 lb (64.4 kg). Her temperature is 97.1 F (36.2 C) (abnormal). Her blood pressure is 147/93 (abnormal) and her pulse is 62. Her oxygen saturation is 99%.   Reason for encounter: post-procedure assessment.  The patient indicates having attained complete relief of the pain for the duration of local anesthetic on her second diagnostic bilateral sacroiliac joint block.  Today we have talked about the radiofrequency ablation and she wants to proceed with it.  Post-Procedure Evaluation  Procedure (02/09/2020): Diagnostic bilateral sacroiliac joint block #2 under fluoroscopic guidance and IV sedation Pre-procedure pain level: 8/10 Post-procedure: 2/10 (> 50% relief)  Sedation: Sedation provided.  Effectiveness during initial hour after procedure(Ultra-Short Term Relief): 100 %.  Local anesthetic used: Long-acting (4-6 hours) Effectiveness: Defined as any analgesic benefit obtained secondary to the administration of local anesthetics. This carries significant diagnostic value as to the etiological location, or anatomical origin, of the pain. Duration of benefit is expected to coincide with the duration of the local anesthetic used.  Effectiveness during initial 4-6 hours after procedure(Short-Term Relief): 100 %.  Long-term benefit: Defined as any relief past the  pharmacologic duration of the local anesthetics.  Effectiveness past the initial 6 hours after procedure(Long-Term Relief): 100 % (lasted about 2 weeks).  Current benefits: Defined as benefit that persist at this time.   Analgesia:  50% improved Function: Somewhat improved ROM: Somewhat improved  Statement of Medical Necessity:  [name] to experienced debilitating chronic nerve-associated pain from the Sacroiliac Joint Pain Syndrome (Other specified dorsopathies, sacral and sacrococcygeal region [M53.88]).  Duration: This pain has persisted for longer than three months.  Non-surgical care: The patient has either failed to respond, or was unable to tolerate, or simply did not get enough benefit from other more conservative therapies including, but not limited to: 1. Over-the-counter oral analgesic medications (i.e.: ibuprofen, naproxen, etc.) 2. Anti-inflammatory medications 3. Muscle relaxants 4. Membrane stabilizers 5. Opioids 6. Physical therapy (PT), chiropractic manipulation, and/or home exercise program (HEP). 7. Modalities (Heat, ice, etc.)  Invasive therapies: Nerve blocks have failed to provide any significant long-term benefit.  Surgical care: Not indicated.  Physical exam: Has been consistent with Sacroiliac Joint Pain Syndrome.  Diagnostic imaging: Sacroiliac Joint Osteoarthritis.   Radiography disclaimer note: The predictive validity of lumbar X-ray images and MRIs to distinguish between low back pain subtypes in patients with chronic LBP is questionable. (J Pain Relief 2018, Vol 7(3): 321).  Diagnostic interventional therapies: [name] has attained greater than 50% reduction in pain from at least two (2) diagnostic medial branch blocks conducted in separate occasions.   For the above listed reason, I believe, as the examining and treating physician, that it is medically necessary to proceed with Non-Pulsed Radiofrequency Ablation for the purpose of  attempting to prolong the duration of the benefits seen with the diagnostic injections.  Pharmacotherapy Assessment   Analgesic: No opioid analgesics from our practice.   Monitoring: [place] PMP: PDMP reviewed during this encounter.       Pharmacotherapy: No side-effects or adverse reactions reported. Compliance: No problems identified. Effectiveness: Clinically acceptable.  Chauncey Fischer, RN  02/29/2020  8:48 AM  Sign when Signing Visit Safety precautions to be maintained throughout the outpatient stay will include: orient to surroundings, keep bed in low position, maintain call bell within reach at all times, provide assistance with transfer out of bed and ambulation.     UDS: No results found for: SUMMARY   ROS  Constitutional: Denies any fever or chills Gastrointestinal: No reported hemesis, hematochezia, vomiting, or acute GI distress Musculoskeletal: Denies any acute onset joint swelling, redness, loss of ROM, or weakness Neurological: No reported episodes of acute onset apraxia, aphasia, dysarthria, agnosia, amnesia, paralysis, loss of coordination, or loss of consciousness  Medication Review  Turmeric, acetaminophen, amLODipine, atenolol, betamethasone dipropionate, calcium carbonate, co-enzyme Q-10, esomeprazole, etanercept, fexofenadine, fluticasone, gabapentin, losartan, meloxicam, montelukast, multivitamin with minerals, and valACYclovir  History Review  Allergy: Ms. Patton is allergic to atorvastatin, codeine, and sulfa antibiotics. Drug: Ms. Sulton  reports no history of drug use. Alcohol:  reports current alcohol use of about 4.0 standard drinks of alcohol per week. Tobacco:  reports that she has never smoked. She has never used smokeless tobacco. Social: Ms. Ganesh  reports that she has never smoked. She has never used smokeless tobacco. She reports current alcohol use of about 4.0 standard drinks of alcohol per week. She reports that she does not use drugs. Medical:  has  a past medical history of COVID-19 (12/15/2018), Flank lipoma (08/17/2016), Hypertension, Neck mass (04/07/2013), Psoriatic arthritis ([place]), Recurrent cellulitis, and T2DM (type 2 diabetes mellitus) ([place]) (10/01/2018). Surgical: Ms. Belasco  has a past surgical history that includes Abdominal hysterectomy (2000); lipoma removed (04/2012); Colonoscopy (2015); and Neck mass excision (04/28/14). Family: family history includes Healthy in her mother.  Laboratory Chemistry Profile   Renal Lab Results  Component Value Date  BUN 16 01/25/2020   CREATININE 0.64 01/25/2020   BCR 25 01/25/2020   GFRAA 107 01/25/2020   GFRNONAA 93 01/25/2020     Hepatic Lab Results  Component Value Date   AST 24 01/25/2020   ALT 24 01/25/2020   ALBUMIN 4.9 (H) 01/25/2020   ALKPHOS 74 01/25/2020     Electrolytes Lab Results  Component Value Date   NA 140 01/25/2020   K 4.4 01/25/2020   CL 100 01/25/2020   CALCIUM 10.1 01/25/2020   MG 2.1 01/03/2017     Bone Lab Results  Component Value Date   25OHVITD1 50 01/03/2017   25OHVITD2 <1.0 01/03/2017   25OHVITD3 50 01/03/2017     Inflammation (CRP: Acute Phase) (ESR: Chronic Phase) Lab Results  Component Value Date   CRP 0.8 01/03/2017   ESRSEDRATE 2 01/25/2020       Note: Above Lab results reviewed.  Recent Imaging Review  DG PAIN CLIN[facility] C-ARM 1-60 MIN NO REPORT Fluoro was used, but no Radiologist interpretation will be provided.  Please refer to "NOTES" tab for provider progress note. Note: Reviewed        Physical Exam  General appearance: Well nourished, well developed, and well hydrated. In no apparent acute distress Mental status: Alert, oriented x 3 (person, place, & time)       Respiratory: No evidence of acute respiratory distress Eyes: PERLA Vitals: BP (!) 147/93   Pulse 62   Temp (!) 97.1 F (36.2 C)   Ht _0  (1.575 m)   Wt 142 lb (64.4 kg)   SpO2 99%   BMI 25.97 kg/m  BMI: Estimated body mass index is 25.97 kg/m as  calculated from the following:   Height as of this encounter: _1  (1.575 m).   Weight as of this encounter: 142 lb (64.4 kg). Ideal: Ideal body weight: 50.1 kg (110 lb 7.2 oz) Adjusted ideal body weight: 55.8 kg (123 lb 1.1 oz)  Assessment   Status Diagnosis  Controlled Controlled Controlled 1. Chronic pain syndrome   2. Chronic sacroiliac joint pain (Bilateral) (R>L)   3. Chronic low back pain (Primary Area of Pain) (Bilateral) (R>L)   4. Chronic lower extremity pain (Secondary Area of Pain) (Bilateral) (R>L)      Updated Problems: No problems updated.  Plan of Care  Problem-specific:  No problem-specific Assessment & Plan notes found for this encounter.  Ms. Samyra Limb has a current medication list which includes the following long-term medication(s): amlodipine, atenolol, calcium carbonate, enbrel sureclick, esomeprazole, fexofenadine, fluticasone, gabapentin, losartan, and montelukast.  Pharmacotherapy (Medications Ordered): No orders of the defined types were placed in this encounter.  Orders:  Orders Placed This Encounter  Procedures  . Radiofrequency Sacroiliac Joint    Standing Status:   Future    Standing Expiration Date:   02/28/2021    Scheduling Instructions:     Side(s): Right-sided     Level(s): L4, L5, S1, S2, & S3 Medial Branch Nerve(s)     Sedation: Patient's choice.     Scheduling Timeframe: As soon as pre-approved     Procedure: Sacroiliac joint RFA   Follow-up plan:   Return for Radio-Frequency: (R) SI RFA #1.      Interventional management options: Planned, scheduled, and/or pending:   None at this time.    Considering:   Diagnostic bilateral SI joint block Possible bilateral SI joint RFA Diagnostic bilateral hip joint injection Possible bilateral femoral +obturator NB Possible bilateral femoral + obturator nerve RFA Diagnostic  right L4-5 LESI #2   Palliative PRN treatment(s):   Palliativebilateral lumbar facet  block#6(diagnostic lumbar facet blocks provide the patient with 90 to 100% relief of the pain, unfortunately they did not last.  The insurance company has denied radiofrequency ablation for some unknown reason.) Palliative right lumbar facet RFA #2 (last done on 07/21/2019) (100/100/75)  Palliative left lumbar facet RFA #2 (last done on 08/06/2019)  (100/100/75)  Diagnostic bilateral SI joint block #2 (last done 12/15/2019) (100/100/80)      Recent Visits Date Type Provider Dept  02/09/20 Procedure visit Milinda Pointer, MD [facility]  01/13/20 Telemedicine Milinda Pointer, MD [facility]  12/15/19 Procedure visit Milinda Pointer, MD [facility]  12/14/19 Office Visit Milinda Pointer, MD [facility]  Showing recent visits within past 90 days and meeting all other requirements Today's Visits Date Type Provider Dept  02/29/20 Office Visit Milinda Pointer, MD [facility]  Showing today's visits and meeting all other requirements Future Appointments No visits were found meeting these conditions. Showing future appointments within next 90 days and meeting all other requirements  I discussed the assessment and treatment plan with the patient. The patient was provided an opportunity to ask questions and all were answered. The patient agreed with the plan and demonstrated an understanding of the instructions.  Patient advised to call back or seek an in-person evaluation if the symptoms or condition worsens.  Duration of encounter: 30 minutes.  Note by: Gaspar Cola, MD Date: 02/29/2020; Time: 9:14 AM

## 2020-03-17 ENCOUNTER — Ambulatory Visit (HOSPITAL_BASED_OUTPATIENT_CLINIC_OR_DEPARTMENT_OTHER): Payer: Medicare Other | Admitting: Pain Medicine

## 2020-03-17 ENCOUNTER — Ambulatory Visit
Admission: RE | Admit: 2020-03-17 | Discharge: 2020-03-17 | Disposition: A | Discharge status: Home / Self Care | Payer: Medicare Other | Source: Ambulatory Visit | Attending: Pain Medicine | Admitting: Pain Medicine

## 2020-03-17 ENCOUNTER — Encounter: Payer: Self-pay | Admitting: Pain Medicine

## 2020-03-17 ENCOUNTER — Other Ambulatory Visit: Payer: Self-pay

## 2020-03-17 VITALS — BP 125/65 | HR 57 | Temp 97.2°F | Resp 15 | Ht 61.0 in | Wt 142.0 lb

## 2020-03-17 DIAGNOSIS — M47898 Other spondylosis, sacral and sacrococcygeal region: Secondary | ICD-10-CM | POA: Diagnosis not present

## 2020-03-17 DIAGNOSIS — M461 Sacroiliitis, not elsewhere classified: Secondary | ICD-10-CM

## 2020-03-17 DIAGNOSIS — G8918 Other acute postprocedural pain: Secondary | ICD-10-CM | POA: Diagnosis not present

## 2020-03-17 DIAGNOSIS — G8929 Other chronic pain: Secondary | ICD-10-CM

## 2020-03-17 DIAGNOSIS — M79604 Pain in right leg: Secondary | ICD-10-CM | POA: Diagnosis not present

## 2020-03-17 DIAGNOSIS — M4608 Spinal enthesopathy, sacral and sacrococcygeal region: Secondary | ICD-10-CM

## 2020-03-17 DIAGNOSIS — M779 Enthesopathy, unspecified: Secondary | ICD-10-CM | POA: Insufficient documentation

## 2020-03-17 DIAGNOSIS — M545 Low back pain, unspecified: Secondary | ICD-10-CM | POA: Diagnosis not present

## 2020-03-17 DIAGNOSIS — M9904 Segmental and somatic dysfunction of sacral region: Secondary | ICD-10-CM | POA: Insufficient documentation

## 2020-03-17 DIAGNOSIS — M533 Sacrococcygeal disorders, not elsewhere classified: Secondary | ICD-10-CM

## 2020-03-17 DIAGNOSIS — M47818 Spondylosis without myelopathy or radiculopathy, sacral and sacrococcygeal region: Secondary | ICD-10-CM | POA: Diagnosis not present

## 2020-03-17 MED ORDER — OXYCODONE-ACETAMINOPHEN 5-325 MG PO TABS: 1.0000 | ORAL_TABLET | Freq: Four times a day (QID) | ORAL | 0 refills | Status: DC | PRN

## 2020-03-17 MED ORDER — FENTANYL CITRATE (PF) 100 MCG/2ML IJ SOLN
INTRAMUSCULAR | Status: AC
  Filled 2020-03-17: qty 2

## 2020-03-17 MED ORDER — MIDAZOLAM HCL 5 MG/5ML IJ SOLN
1.0000 mg | INTRAMUSCULAR | Status: DC | PRN
  Administered 2020-03-17: 0.5 mg via INTRAVENOUS

## 2020-03-17 MED ORDER — ROPIVACAINE HCL 2 MG/ML IJ SOLN
INTRAMUSCULAR | Status: AC
  Filled 2020-03-17: qty 10

## 2020-03-17 MED ORDER — LIDOCAINE HCL 2 % IJ SOLN
20.0000 mL | Freq: Once | INTRAMUSCULAR | Status: AC
  Administered 2020-03-17: 400 mg

## 2020-03-17 MED ORDER — OXYCODONE-ACETAMINOPHEN 5-325 MG PO TABS: 1.0000 | ORAL_TABLET | Freq: Four times a day (QID) | ORAL | 0 refills | Status: AC | PRN

## 2020-03-17 MED ORDER — LACTATED RINGERS IV SOLN
1000.0000 mL | Freq: Once | INTRAVENOUS | Status: AC
  Administered 2020-03-17: 1000 mL via INTRAVENOUS

## 2020-03-17 MED ORDER — FENTANYL CITRATE (PF) 100 MCG/2ML IJ SOLN
25.0000 ug | INTRAMUSCULAR | Status: DC | PRN
  Administered 2020-03-17: 25 ug via INTRAVENOUS

## 2020-03-17 MED ORDER — MIDAZOLAM HCL 5 MG/5ML IJ SOLN
INTRAMUSCULAR | Status: AC
  Filled 2020-03-17: qty 5

## 2020-03-17 MED ORDER — LIDOCAINE HCL 2 % IJ SOLN
INTRAMUSCULAR | Status: AC
  Filled 2020-03-17: qty 20

## 2020-03-17 MED ORDER — TRIAMCINOLONE ACETONIDE 40 MG/ML IJ SUSP
40.0000 mg | Freq: Once | INTRAMUSCULAR | Status: AC
  Administered 2020-03-17: 40 mg

## 2020-03-17 MED ORDER — TRIAMCINOLONE ACETONIDE 40 MG/ML IJ SUSP
INTRAMUSCULAR | Status: AC
  Filled 2020-03-17: qty 1

## 2020-03-17 MED ORDER — ROPIVACAINE HCL 2 MG/ML IJ SOLN
9.0000 mL | Freq: Once | INTRAMUSCULAR | Status: AC
  Administered 2020-03-17: 9 mL via PERINEURAL

## 2020-03-17 NOTE — Patient Instructions (Signed)

## 2020-03-17 NOTE — Progress Notes (Signed)
PROVIDER NOTE: Information contained herein reflects review and annotations entered in association with encounter. Interpretation of such information and data should be left to medically-trained personnel. Information provided to patient can be located elsewhere in the medical record under "Patient Instructions". Document created using STT-dictation technology, any transcriptional errors that may result from process are unintentional.    Patient: Ashley Rodriguez  Service Category: Procedure  Provider: Gaspar Cola, MD  DOB: 12/15/1950  DOS: 03/17/2020  Location: [facility]  MRN: [id number]  Setting: Ambulatory - outpatient  Referring Provider: Virginia Crews, MD  Type: Established Patient  Specialty: Interventional Pain Management  PCP: Virginia Crews, MD   Primary Reason for Visit: Interventional Pain Management Treatment. CC: Leg Pain (right)  Procedure:          Anesthesia, Analgesia, Anxiolysis:  Type: Thermal Sacroiliac joint Radiofrequency Ablation (medial branch of L5, S1, S2, and S3)   #1  Region: Lumbosacral Level: L5, S1, S2, & S3 Medial Branch Level(s). These levels will denervate the posterior Sacroiliac Joint innervation. Primary Purpose: Therapeutic Region: Posterolateral Lumbosacral Spine Laterality: Right  Type: Moderate (Conscious) Sedation combined with Local Anesthesia Indication(s): Analgesia and Anxiety Route: Intravenous (IV) IV Access: Secured Sedation: Meaningful verbal contact was maintained at all times during the procedure  Local Anesthetic: Lidocaine 1-2%  Position: Supine Prone   Indications: 1. Chronic sacroiliac joint pain (Right)   2. Other spondylosis, sacral and sacrococcygeal region   3. Osteoarthritis of sacroiliac joint ([place]) (Right)   4. Enthesopathy of sacroiliac joint   5. Chronic low back pain (Bilateral) w/o sciatica    Ms. Hanover has been dealing with the above chronic pain for longer than three months and  has either failed to respond, was unable to tolerate, or simply did not get enough benefit from other more conservative therapies including, but not limited to: 1. Over-the-counter medications 2. Anti-inflammatory medications 3. Muscle relaxants 4. Membrane stabilizers 5. Opioids 6. Physical therapy and/or chiropractic manipulation 7. Modalities (Heat, ice, etc.) 8. Invasive techniques such as nerve blocks. Ms. Tatham has attained more than 50% relief of the pain from a series of diagnostic injections conducted in separate occasions.  Pain Score: Pre-procedure: 3 /10 Post-procedure: 0-No pain/10  Pre-op H&P Assessment:  Ms. Plaia is a 69 y.o. (year old), female patient, seen today for interventional treatment. She  has a past surgical history that includes Abdominal hysterectomy (2000); lipoma removed (04/2012); Colonoscopy (2015); and Neck mass excision (04/28/14). Ms. Hasley has a current medication list which includes the following prescription(s): acetaminophen, amlodipine, atenolol, betamethasone dipropionate, calcium carbonate, co-enzyme D-35, enbrel sureclick, esomeprazole, fexofenadine, fluticasone, gabapentin, losartan, meloxicam, montelukast, multivitamin with minerals, red yeast rice, turmeric, valacyclovir, oxycodone-acetaminophen, and [START ON 03/24/2020] oxycodone-acetaminophen, and the following Facility-Administered Medications: fentanyl and midazolam. Her primarily concern today is the Leg Pain (right)  Initial Vital Signs:  Pulse/HCG Rate: (!) 57ECG Heart Rate: (!) 59 Temp: (!) 97.3 F (36.3 C) Resp: 16 BP: (!) 174/88 SpO2: 99 %  BMI: Estimated body mass index is 26.83 kg/m as calculated from the following:   Height as of this encounter: 5\' 1"  (1.549 m).   Weight as of this encounter: 142 lb (64.4 kg).  Risk Assessment: Allergies: Reviewed. She is allergic to atorvastatin, codeine, and sulfa antibiotics.  Allergy Precautions: None required Coagulopathies:  Reviewed. None identified.  Blood-thinner therapy: None at this time Active Infection(s): Reviewed. None identified. Ms. Donnan is afebrile  Site Confirmation: Ms. Bumpass was asked to confirm the procedure and  laterality before marking the site Procedure checklist: Completed Consent: Before the procedure and under the influence of no sedative(s), amnesic(s), or anxiolytics, the patient was informed of the treatment options, risks and possible complications. To fulfill our ethical and legal obligations, as recommended by the American Medical Association's Code of Ethics, I have informed the patient of my clinical impression; the nature and purpose of the treatment or procedure; the risks, benefits, and possible complications of the intervention; the alternatives, including doing nothing; the risk(s) and benefit(s) of the alternative treatment(s) or procedure(s); and the risk(s) and benefit(s) of doing nothing. The patient was provided information about the general risks and possible complications associated with the procedure. These may include, but are not limited to: failure to achieve desired goals, infection, bleeding, organ or nerve damage, allergic reactions, paralysis, and death. In addition, the patient was informed of those risks and complications associated to the procedure, such as failure to decrease pain; infection; bleeding; organ or nerve damage with subsequent damage to sensory, motor, and/or autonomic systems, resulting in permanent pain, numbness, and/or weakness of one or several areas of the body; allergic reactions; (i.e.: anaphylactic reaction); and/or death. Furthermore, the patient was informed of those risks and complications associated with the medications. These include, but are not limited to: allergic reactions (i.e.: anaphylactic or anaphylactoid reaction(s)); adrenal axis suppression; blood sugar elevation that in diabetics may result in ketoacidosis or comma; water retention  that in patients with history of congestive heart failure may result in shortness of breath, pulmonary edema, and decompensation with resultant heart failure; weight gain; swelling or edema; medication-induced neural toxicity; particulate matter embolism and blood vessel occlusion with resultant organ, and/or nervous system infarction; and/or aseptic necrosis of one or more joints. Finally, the patient was informed that Medicine is not an exact science; therefore, there is also the possibility of unforeseen or unpredictable risks and/or possible complications that may result in a catastrophic outcome. The patient indicated having understood very clearly. We have given the patient no guarantees and we have made no promises. Enough time was given to the patient to ask questions, all of which were answered to the patient's satisfaction. Ms. Kernen has indicated that she wanted to continue with the procedure. Attestation: I, the ordering provider, attest that I have discussed with the patient the benefits, risks, side-effects, alternatives, likelihood of achieving goals, and potential problems during recovery for the procedure that I have provided informed consent. Date  Time: 03/17/2020  9:57 AM  Pre-Procedure Preparation:  Monitoring: As per clinic protocol. Respiration, ETCO2, SpO2, BP, heart rate and rhythm monitor placed and checked for adequate function Safety Precautions: Patient was assessed for positional comfort and pressure points before starting the procedure. Time-out: I initiated and conducted the "Time-out" before starting the procedure, as per protocol. The patient was asked to participate by confirming the accuracy of the "Time Out" information. Verification of the correct person, site, and procedure were performed and confirmed by me, the nursing staff, and the patient. "Time-out" conducted as per Joint Commission's Universal Protocol (UP.01.01.01). Time: 1047  Description of Procedure:           Laterality: Right Level: L5, S1, S2, & S3 Medial Branch Level(s), at the posterior Sacroiliac Joint Area Prepped: Lumbosacral DuraPrep (Iodine Povacrylex [0.7% available iodine] and Isopropyl Alcohol, 74% w/w) Safety Precautions: Aspiration looking for blood return was conducted prior to all injections. At no point did we inject any substances, as a needle was being advanced. Before injecting, the patient was  told to immediately notify me if she was experiencing any new onset of "ringing in the ears, or metallic taste in the mouth". No attempts were made at seeking any paresthesias. Safe injection practices and needle disposal techniques used. Medications properly checked for expiration dates. SDV (single dose vial) medications used. After the completion of the procedure, all disposable equipment used was discarded in the proper designated medical waste containers. Local Anesthesia: Protocol guidelines were followed. The patient was positioned over the fluoroscopy table. The area was prepped in the usual manner. The time-out was completed. The target area was identified using fluoroscopy. A 12-in long, straight, sterile hemostat was used with fluoroscopic guidance to locate the targets for each level blocked. Once located, the skin was marked with an approved surgical skin marker. Once all sites were marked, the skin (epidermis, dermis, and hypodermis), as well as deeper tissues (fat, connective tissue and muscle) were infiltrated with a small amount of a short-acting local anesthetic, loaded on a 10cc syringe with a 25G, 1.5-in  Needle. An appropriate amount of time was allowed for local anesthetics to take effect before proceeding to the next step. Local Anesthetic: Lidocaine 2.0% The unused portion of the local anesthetic was discarded in the proper designated containers. Technical explanation of process:  Radiofrequency Ablation (RFA) L5 Medial Branch Nerve RFA: The target area for the L5 medial  branch is at the junction of the postero-lateral aspect of the superior articular process of S1 and the superior, posterior, and medial edge of the sacral ala. Under fluoroscopic guidance, a Radiofrequency needle was inserted until contact was made with os over the superior postero-lateral aspect of the pedicular shadow (target area). Sensory and motor testing was conducted to properly adjust the position of the needle. Once satisfactory placement of the needle was achieved, the numbing solution was slowly injected after negative aspiration for blood. 2.0 mL of the nerve block solution was injected without difficulty or complication. After waiting for at least 3 minutes, the ablation was performed. Once completed, the needle was removed intact. S1 Primary Dorsal Rami and Lateral Branch Nerve RFA: The target area for the S1 medial branch is located inferior to the junction of the S1 superior articular process and the L5 inferior articular process, posterior, inferior, and lateral to the 6 o'clock position of the L5-S1 facet joint, just superior to the S1 posterior foramen. Under fluoroscopic guidance, the Radiofrequency needle was advanced until contact was made with os over the Target area. Sensory and motor testing was conducted to properly adjust the position of the needle. Once satisfactory placement of the needle was achieved, the numbing solution was slowly injected after negative aspiration for blood. 2.0 mL of the nerve block solution was injected without difficulty or complication. After waiting for at least 3 minutes, the ablation was performed. Once completed, the needle was removed intact. S2 Primary Dorsal Rami and Lateral Branch Nerve RFA: The target area for the S2 medial branch is at the posterior superior lateral of the S2 posterior neural foramen. Under fluoroscopic guidance, the Radiofrequency needle was advanced until contact was made with os over the Target area. Sensory and motor testing was  conducted to properly adjust the position of the needle. Once satisfactory placement of the needle was achieved, the numbing solution was slowly injected after negative aspiration for blood. 2.0 mL of the nerve block solution was injected without difficulty or complication. After waiting for at least 3 minutes, the ablation was performed. Once completed, the needle was removed  intact. S3 Primary Dorsal Rami and Lateral Branch Nerve RFA: The target area for the S3 medial branch is at the posterior superior lateral of the S3 posterior neural foramen. Under fluoroscopic guidance, the Radiofrequency needle was advanced until contact was made with os over the Target area. Sensory and motor testing was conducted to properly adjust the position of the needle. Once satisfactory placement of the needle was achieved, the numbing solution was slowly injected after negative aspiration for blood. 2.0 mL of the nerve block solution was injected without difficulty or complication. After waiting for at least 3 minutes, the ablation was performed. Once completed, the needle was removed intact. Radiofrequency lesioning (ablation):  Radiofrequency Generator: NeuroTherm [id number] Sensory Stimulation Parameters: 50 Hz was used to locate & identify the nerve, making sure that the needle was positioned such that there was no sensory stimulation below 0.3 V or above 0.7 V. Motor Stimulation Parameters: 2 Hz was used to evaluate the motor component. Care was taken not to lesion any nerves that demonstrated motor stimulation of the lower extremities at an output of less than 2.5 times that of the sensory threshold, or a maximum of 2.0 V. Lesioning Technique Parameters: Standard Radiofrequency settings. (Not bipolar or pulsed.) Temperature Settings: 80 degrees C Lesioning time: 60 seconds Intra-operative Compliance: Compliant Materials & Medications: Needle(s) (Electrode/Cannula) Type: Teflon-coated, curved tip, Radiofrequency  needle(s) Gauge: 22G Length: 10cm Numbing solution: 0.2% PF-Ropivacaine + Triamcinolone (40 mg/mL) diluted to a final concentration of 4 mg of Triamcinolone/mL of Ropivacaine The unused portion of the solution was discarded in the proper designated containers.   Once the entire procedure was completed, the treated area was cleaned, making sure to leave some of the prepping solution back to take advantage of its long term bactericidal properties. Intra-operative Compliance: Compliant  Vitals:   03/17/20 1119 03/17/20 1129 03/17/20 1139 03/17/20 1150  BP: 120/65 130/75 124/66 125/65  Pulse:      Resp: 14 13 15 15   Temp:  (!) 97.1 F (36.2 C)  (!) 97.2 F (36.2 C)  TempSrc:      SpO2: 97% 95% 98% 98%  Weight:      Height:        Start Time: 1047 hrs. End Time: 1121 hrs.  Imaging Guidance (Non-Spinal):          Type of Imaging Technique: Fluoroscopy Guidance (Non-Spinal) Indication(s): Assistance in needle guidance and placement for procedures requiring needle placement in or near specific anatomical locations not easily accessible without such assistance. Exposure Time: Please see nurses notes. Contrast: Before injecting any contrast, we confirmed that the patient did not have an allergy to iodine, shellfish, or radiological contrast. Once satisfactory needle placement was completed at the desired level, radiological contrast was injected. Contrast injected under live fluoroscopy. No contrast complications. See chart for type and volume of contrast used. Fluoroscopic Guidance: I was personally present during the use of fluoroscopy. "Tunnel Vision Technique" used to obtain the best possible view of the target area. Parallax error corrected before commencing the procedure. "Direction-depth-direction" technique used to introduce the needle under continuous pulsed fluoroscopy. Once target was reached, antero-posterior, oblique, and lateral fluoroscopic projection used confirm needle placement  in all planes. Images permanently stored in EMR. Interpretation: I personally interpreted the imaging intraoperatively. Adequate needle placement confirmed in multiple planes. Appropriate spread of contrast into desired area was observed. No evidence of afferent or efferent intravascular uptake. Permanent images saved into the patient's record.  Antibiotic Prophylaxis:   Anti-infectives (From admission,  onward)   None     Indication(s): None identified  Post-operative Assessment:  Post-procedure Vital Signs:  Pulse/HCG Rate: (!) 5761 Temp: (!) 97.2 F (36.2 C) Resp: 15 BP: 125/65 SpO2: 98 %  EBL: None  Complications: No immediate post-treatment complications observed by team, or reported by patient.  Note: The patient tolerated the entire procedure well. A repeat set of vitals were taken after the procedure and the patient was kept under observation following institutional policy, for this type of procedure. Post-procedural neurological assessment was performed, showing return to baseline, prior to discharge. The patient was provided with post-procedure discharge instructions, including a section on how to identify potential problems. Should any problems arise concerning this procedure, the patient was given instructions to immediately contact us, at any time, without hesitation. In any case, we plan to contact the patient by telephone for a follow-up status report regarding this interventional procedure.  Comments:  No additional relevant information.  Plan of Care  Orders:  Orders Placed This Encounter  Procedures  . Radiofrequency Sacroiliac Joint    Scheduling Instructions:     Side(s): Right-sided     Level(s): L4, L5, S1, S2, & S3 Medial Branch Nerve(s)     Sedation: With Sedation.     Timeframe: Today     Procedure: Sacroiliac joint RFA  . DG PAIN [facility] C-ARM 1-60 MIN NO REPORT    Intraoperative interpretation by procedural physician at [place] [facility].     Standing Status:   Standing    Number of Occurrences:   1    Order Specific Question:   Reason for exam:    Answer:   Assistance in needle guidance and placement for procedures requiring needle placement in or near specific anatomical locations not easily accessible without such assistance.  . Provide equipment / supplies at bedside    "Radiofrequency Tray"; Large hemostat (x1); Small hemostat (x1); Towels (x8); 4x4 sterile sponge pack (x1) Needle type: Teflon-coated Radiofrequency Needle (Disposable  single use) Size: Regular Quantity: 5    Standing Status:   Standing    Number of Occurrences:   1    Order Specific Question:   Specify    Answer:   Radiofrequency Tray  . Informed Consent Details: Physician/Practitioner Attestation; Transcribe to consent form and obtain patient signature    Order Specific Question:   Physician/Practitioner attestation of informed consent for procedure/surgical case    Answer:   I, the physician/practitioner, attest that I have discussed with the patient the benefits, risks, side effects, alternatives, likelihood of achieving goals and potential problems during recovery for the procedure that I have provided informed consent.    Order Specific Question:   Procedure    Answer:   Sacroiliac joint radiofrequency ablation    Order Specific Question:   Physician/Practitioner performing the procedure    Answer:   Cosandra Plouffe A. Dossie Arbour, MD    Order Specific Question:   Indication/Reason    Answer:   Chronic sacroiliac joint pain   Chronic Opioid Analgesic:  No opioid analgesics from our practice.   Medications ordered for procedure: Meds ordered this encounter  Medications  . lidocaine (XYLOCAINE) 2 % (with pres) injection 400 mg  . lactated ringers infusion 1,000 mL  . midazolam (VERSED) 5 MG/5ML injection 1-2 mg    Make sure Flumazenil is available in the pyxis when using this medication. If oversedation occurs, administer 0.2 mg IV over 15 sec. If after 45  sec no response, administer 0.2 mg again over  1 min; may repeat at 1 min intervals; not to exceed 4 doses (1 mg)  . fentaNYL (SUBLIMAZE) injection 25-50 mcg    Make sure Narcan is available in the pyxis when using this medication. In the event of respiratory depression (RR< 8/min): Titrate NARCAN (naloxone) in increments of 0.1 to 0.2 mg IV at 2-3 minute intervals, until desired degree of reversal.  . ropivacaine (PF) 2 mg/mL (0.2%) (NAROPIN) injection 9 mL  . triamcinolone acetonide (KENALOG-40) injection 40 mg  . oxyCODONE-acetaminophen (PERCOCET) 5-325 MG tablet    Sig: Take 1 tablet by mouth every 6 (six) hours as needed for up to 7 days for severe pain. Must last 7 days.    Dispense:  28 tablet    Refill:  0    For acute post-operative pain. Not to be refilled. Most last 7 days.  Marland Kitchen oxyCODONE-acetaminophen (PERCOCET) 5-325 MG tablet    Sig: Take 1 tablet by mouth every 6 (six) hours as needed for up to 7 days for severe pain. Must last 7 days.    Dispense:  28 tablet    Refill:  0    For acute post-operative pain. Not to be refilled. Must last 7 days.   Medications administered: We administered lidocaine, lactated ringers, midazolam, fentaNYL, ropivacaine (PF) 2 mg/mL (0.2%), and triamcinolone acetonide.  See the medical record for exact dosing, route, and time of administration.  Follow-up plan:   Return in about 6 weeks (around 04/28/2020) for (F2F), (PP) Follow-up.       Interventional management options: Planned, scheduled, and/or pending:   None at this time.    Considering:   Diagnostic bilateral SI joint block Possible bilateral SI joint RFA Diagnostic bilateral hip joint injection Possible bilateral femoral +obturator NB Possible bilateral femoral + obturator nerve RFA Diagnostic right L4-5 LESI #2   Palliative PRN treatment(s):   Palliativebilateral lumbar facet block#6(diagnostic lumbar facet blocks provide the patient with 90 to 100% relief of the pain,  unfortunately they did not last.  The insurance company has denied radiofrequency ablation for some unknown reason.) Palliative right lumbar facet RFA #2 (last done on 07/21/2019) (100/100/75)  Palliative left lumbar facet RFA #2 (last done on 08/06/2019)  (100/100/75)  Diagnostic bilateral SI joint block #2 (last done 12/15/2019) (100/100/80)       Recent Visits Date Type Provider Dept  02/29/20 Office Visit Milinda Pointer, MD [facility]  02/09/20 Procedure visit Milinda Pointer, MD [facility]  01/13/20 Telemedicine Milinda Pointer, MD [facility]  Showing recent visits within past 90 days and meeting all other requirements Today's Visits Date Type Provider Dept  03/17/20 Procedure visit Milinda Pointer, MD [facility]  Showing today's visits and meeting all other requirements Future Appointments Date Type Provider Dept  03/31/20 Appointment Milinda Pointer, MD [facility]  04/27/20 Appointment Milinda Pointer, MD [facility]  Showing future appointments within next 90 days and meeting all other requirements  Disposition: Discharge home  Discharge (Date  Time): 03/17/2020; 1151 hrs.   Primary Care Physician: Virginia Crews, MD Location: [facility] Note by: Gaspar Cola, MD Date: 03/17/2020; Time: 2:28 PM  Disclaimer:  Medicine is not an Chief Strategy Officer. The only guarantee in medicine is that nothing is guaranteed. It is important to note that the decision to proceed with this intervention was based on the information collected from the patient. The Data and conclusions were drawn from the patient's questionnaire, the interview, and the  physical examination. Because the information was provided in large part by the patient, it cannot be guaranteed that it has not been purposely or unconsciously manipulated. Every effort has been made to obtain as much relevant data as  possible for this evaluation. It is important to note that the conclusions that lead to this procedure are derived in large part from the available data. Always take into account that the treatment will also be dependent on availability of resources and existing treatment guidelines, considered by other Pain Management Practitioners as being common knowledge and practice, at the time of the intervention. For Medico-Legal purposes, it is also important to point out that variation in procedural techniques and pharmacological choices are the acceptable norm. The indications, contraindications, technique, and results of the above procedure should only be interpreted and judged by a Board-Certified Interventional Pain Specialist with extensive familiarity and expertise in the same exact procedure and technique.

## 2020-03-18 ENCOUNTER — Telehealth: Payer: Self-pay

## 2020-03-18 NOTE — Telephone Encounter (Signed)
Post procedure phone call. Patient states she is doing ok now but was very sore early this morning.  States she is feeling better.

## 2020-03-21 ENCOUNTER — Other Ambulatory Visit: Payer: Self-pay | Admitting: Family Medicine

## 2020-03-21 NOTE — Telephone Encounter (Signed)
Requested medication (s) are due for refill today: na   Requested medication (s) are on the active medication list: yes   Last refill:  05/23/2016  4 refills   Future visit scheduled: no   Notes to clinic:  historical medication, expired. Do you want to renew Rx?     Requested Prescriptions  Pending Prescriptions Disp Refills   fluticasone (FLONASE) 50 MCG/ACT nasal spray [Pharmacy Med Name: FLUTICASONE PROPIONATE 50 MCG/ACT N] 16 g     Sig: PLACE 2 SPRAYS INTO EACH NOSTRIL ONCE DAILY      Ear, Nose, and Throat: Nasal Preparations - Corticosteroids Passed - 03/21/2020  1:09 PM      Passed - Valid encounter within last 12 months    Recent Outpatient Visits           1 month ago Type 2 diabetes mellitus with other specified complication, without long-term current use of insulin ([place])   [facility] [place], Dionne Bucy, MD   7 months ago Type 2 diabetes mellitus with other specified complication, without long-term current use of insulin [facility])   [facility] [place], Dionne Bucy, MD   8 months ago [employer] to [employer] preventive visit   [facility], Dionne Bucy, MD   11 months ago Chronic cough   [facility] [place], Dionne Bucy, MD   1 year ago Hypertension associated with diabetes [facility])   [facility] Dionne Bucy, MD

## 2020-03-29 ENCOUNTER — Ambulatory Visit: Payer: Medicare Other | Admitting: Pain Medicine

## 2020-03-30 ENCOUNTER — Other Ambulatory Visit: Payer: Self-pay

## 2020-03-30 ENCOUNTER — Ambulatory Visit
Admission: RE | Admit: 2020-03-30 | Discharge: 2020-03-30 | Disposition: A | Discharge status: Home / Self Care | Payer: Medicare Other | Source: Ambulatory Visit | Attending: Family Medicine | Admitting: Family Medicine

## 2020-03-30 DIAGNOSIS — Z1231 Encounter for screening mammogram for malignant neoplasm of breast: Secondary | ICD-10-CM | POA: Insufficient documentation

## 2020-03-31 ENCOUNTER — Ambulatory Visit (HOSPITAL_BASED_OUTPATIENT_CLINIC_OR_DEPARTMENT_OTHER): Payer: Medicare Other | Admitting: Pain Medicine

## 2020-03-31 ENCOUNTER — Encounter: Payer: Self-pay | Admitting: Pain Medicine

## 2020-03-31 ENCOUNTER — Ambulatory Visit
Admission: RE | Admit: 2020-03-31 | Discharge: 2020-03-31 | Disposition: A | Discharge status: Home / Self Care | Payer: Medicare Other | Source: Ambulatory Visit | Attending: Pain Medicine | Admitting: Pain Medicine

## 2020-03-31 VITALS — BP 130/58 | HR 52 | Temp 97.3°F | Resp 15 | Ht 62.0 in | Wt 139.0 lb

## 2020-03-31 DIAGNOSIS — G8929 Other chronic pain: Secondary | ICD-10-CM | POA: Diagnosis not present

## 2020-03-31 DIAGNOSIS — M461 Sacroiliitis, not elsewhere classified: Secondary | ICD-10-CM | POA: Diagnosis not present

## 2020-03-31 DIAGNOSIS — M47898 Other spondylosis, sacral and sacrococcygeal region: Secondary | ICD-10-CM | POA: Diagnosis not present

## 2020-03-31 DIAGNOSIS — M533 Sacrococcygeal disorders, not elsewhere classified: Secondary | ICD-10-CM | POA: Diagnosis not present

## 2020-03-31 DIAGNOSIS — M779 Enthesopathy, unspecified: Secondary | ICD-10-CM | POA: Insufficient documentation

## 2020-03-31 DIAGNOSIS — M545 Low back pain, unspecified: Secondary | ICD-10-CM

## 2020-03-31 DIAGNOSIS — M9904 Segmental and somatic dysfunction of sacral region: Secondary | ICD-10-CM | POA: Insufficient documentation

## 2020-03-31 DIAGNOSIS — G8918 Other acute postprocedural pain: Secondary | ICD-10-CM | POA: Diagnosis not present

## 2020-03-31 MED ORDER — TRIAMCINOLONE ACETONIDE 40 MG/ML IJ SUSP
40.0000 mg | Freq: Once | INTRAMUSCULAR | Status: AC
  Administered 2020-03-31: 40 mg
  Filled 2020-03-31: qty 1

## 2020-03-31 MED ORDER — LIDOCAINE HCL 2 % IJ SOLN
20.0000 mL | Freq: Once | INTRAMUSCULAR | Status: AC
  Administered 2020-03-31: 400 mg

## 2020-03-31 MED ORDER — ROPIVACAINE HCL 2 MG/ML IJ SOLN
9.0000 mL | Freq: Once | INTRAMUSCULAR | Status: AC
  Administered 2020-03-31: 9 mL via PERINEURAL
  Filled 2020-03-31: qty 10

## 2020-03-31 MED ORDER — MIDAZOLAM HCL 5 MG/5ML IJ SOLN
1.0000 mg | INTRAMUSCULAR | Status: DC | PRN
  Administered 2020-03-31: 0.5 mg via INTRAVENOUS
  Administered 2020-03-31: 1 mg via INTRAVENOUS
  Filled 2020-03-31: qty 5

## 2020-03-31 MED ORDER — LIDOCAINE HCL (PF) 2 % IJ SOLN
INTRAMUSCULAR | Status: AC
  Filled 2020-03-31: qty 10

## 2020-03-31 MED ORDER — FENTANYL CITRATE (PF) 100 MCG/2ML IJ SOLN
25.0000 ug | INTRAMUSCULAR | Status: AC | PRN
  Administered 2020-03-31: 50 ug via INTRAVENOUS
  Administered 2020-03-31: 25 ug via INTRAVENOUS
  Filled 2020-03-31: qty 2

## 2020-03-31 MED ORDER — OXYCODONE-ACETAMINOPHEN 5-325 MG PO TABS: 1.0000 | ORAL_TABLET | Freq: Four times a day (QID) | ORAL | 0 refills | Status: AC | PRN

## 2020-03-31 MED ORDER — LACTATED RINGERS IV SOLN
1000.0000 mL | Freq: Once | INTRAVENOUS | Status: AC
  Administered 2020-03-31: 1000 mL via INTRAVENOUS

## 2020-03-31 NOTE — Progress Notes (Signed)
Safety precautions to be maintained throughout the outpatient stay will include: orient to surroundings, keep bed in low position, maintain call bell within reach at all times, provide assistance with transfer out of bed and ambulation.  

## 2020-03-31 NOTE — Patient Instructions (Signed)

## 2020-03-31 NOTE — Progress Notes (Signed)
PROVIDER NOTE: Information contained herein reflects review and annotations entered in association with encounter. Interpretation of such information and data should be left to medically-trained personnel. Information provided to patient can be located elsewhere in the medical record under "Patient Instructions". Document created using STT-dictation technology, any transcriptional errors that may result from process are unintentional.    Patient: Ashley Rodriguez  Service Category: Procedure  Provider: Gaspar Cola, MD  DOB: Feb 16, 1951  DOS: 03/31/2020  Location: [facility]  MRN: [id number]  Setting: Ambulatory - outpatient  Referring Provider: Virginia Crews, MD  Type: Established Patient  Specialty: Interventional Pain Management  PCP: Virginia Crews, MD   Primary Reason for Visit: Interventional Pain Management Treatment. CC: Back Pain  Procedure:          Anesthesia, Analgesia, Anxiolysis:  Type: Thermal Sacroiliac joint Radiofrequency Ablation (medial branch of L5, S1, S2, and S3)   #1  Region: Lumbosacral Level: L5, S1, S2, & S3 Medial Branch Level(s). These levels will denervate the posterior Sacroiliac Joint innervation. Primary Purpose: Therapeutic Region: Posterolateral Lumbosacral Spine Laterality: Left  Type: Moderate (Conscious) Sedation combined with Local Anesthesia Indication(s): Analgesia and Anxiety Route: Intravenous (IV) IV Access: Secured Sedation: Meaningful verbal contact was maintained at all times during the procedure  Local Anesthetic: Lidocaine 1-2%  Position: Supine Prone   Indications: 1. Chronic sacroiliac joint pain (Left)   2. Other spondylosis, sacral and sacrococcygeal region   3. Osteoarthritis of sacroiliac joint (Left) ([place])   4. Somatic dysfunction of sacroiliac joint (Left)   5. Enthesopathy of sacroiliac joint   6. Chronic low back pain (Bilateral) w/o sciatica    Ms. Tsou has been dealing with the above  chronic pain for longer than three months and has either failed to respond, was unable to tolerate, or simply did not get enough benefit from other more conservative therapies including, but not limited to: 1. Over-the-counter medications 2. Anti-inflammatory medications 3. Muscle relaxants 4. Membrane stabilizers 5. Opioids 6. Physical therapy and/or chiropractic manipulation 7. Modalities (Heat, ice, etc.) 8. Invasive techniques such as nerve blocks. Ms. Fraley has attained more than 50% relief of the pain from a series of diagnostic injections conducted in separate occasions.  Pain Score: Pre-procedure: 2 /10 Post-procedure: 0-No pain/10  Post-Procedure Evaluation  Procedure (03/17/2020): Therapeutic right sided sacroiliac joint RFA #1 under fluoroscopic guidance and IV sedation Pre-procedure pain level: 3/10 Post-procedure: 0/10 (100% relief)  Sedation: Sedation provided.  Effectiveness during initial hour after procedure(Ultra-Short Term Relief): 100 %.  Local anesthetic used: Long-acting (4-6 hours) Effectiveness: Defined as any analgesic benefit obtained secondary to the administration of local anesthetics. This carries significant diagnostic value as to the etiological location, or anatomical origin, of the pain. Duration of benefit is expected to coincide with the duration of the local anesthetic used.  Effectiveness during initial 4-6 hours after procedure(Short-Term Relief): 100 %.  Long-term benefit: Defined as any relief past the pharmacologic duration of the local anesthetics.  Effectiveness past the initial 6 hours after procedure(Long-Term Relief): 100 %.  Current benefits: Defined as benefit that persist at this time.   Analgesia:  90-100% better Function: Ms. Schnackenberg reports improvement in function ROM: Ms. Selvage reports improvement in ROM  Pre-op H&P Assessment:  Ms. Harten is a 69 y.o. (year old), female patient, seen today for interventional treatment. She  has  a past surgical history that includes Abdominal hysterectomy (2000); lipoma removed (04/2012); Colonoscopy (2015); and Neck mass excision (04/28/14). Ms. Storti has a  current medication list which includes the following prescription(s): acetaminophen, amlodipine, atenolol, betamethasone dipropionate, calcium carbonate, co-enzyme Q-03, enbrel sureclick, esomeprazole, fexofenadine, fluticasone, gabapentin, losartan, meloxicam, montelukast, multivitamin with minerals, oxycodone-acetaminophen, [START ON 04/07/2020] oxycodone-acetaminophen, red yeast rice, turmeric, and valacyclovir, and the following Facility-Administered Medications: midazolam. Her primarily concern today is the Back Pain  Initial Vital Signs:  Pulse/HCG Rate: (!) 58ECG Heart Rate: 60 Temp: (!) 97.2 F (36.2 C) Resp: 16 BP: (!) 146/81 SpO2: 97 %  BMI: Estimated body mass index is 25.42 kg/m as calculated from the following:   Height as of this encounter: 5\' 2"  (1.575 m).   Weight as of this encounter: 139 lb (63 kg).  Risk Assessment: Allergies: Reviewed. She is allergic to atorvastatin, codeine, and sulfa antibiotics.  Allergy Precautions: None required Coagulopathies: Reviewed. None identified.  Blood-thinner therapy: None at this time Active Infection(s): Reviewed. None identified. Ms. Marich is afebrile  Site Confirmation: Ms. Slomski was asked to confirm the procedure and laterality before marking the site Procedure checklist: Completed Consent: Before the procedure and under the influence of no sedative(s), amnesic(s), or anxiolytics, the patient was informed of the treatment options, risks and possible complications. To fulfill our ethical and legal obligations, as recommended by the American Medical Association's Code of Ethics, I have informed the patient of my clinical impression; the nature and purpose of the treatment or procedure; the risks, benefits, and possible complications of the intervention; the alternatives,  including doing nothing; the risk(s) and benefit(s) of the alternative treatment(s) or procedure(s); and the risk(s) and benefit(s) of doing nothing. The patient was provided information about the general risks and possible complications associated with the procedure. These may include, but are not limited to: failure to achieve desired goals, infection, bleeding, organ or nerve damage, allergic reactions, paralysis, and death. In addition, the patient was informed of those risks and complications associated to the procedure, such as failure to decrease pain; infection; bleeding; organ or nerve damage with subsequent damage to sensory, motor, and/or autonomic systems, resulting in permanent pain, numbness, and/or weakness of one or several areas of the body; allergic reactions; (i.e.: anaphylactic reaction); and/or death. Furthermore, the patient was informed of those risks and complications associated with the medications. These include, but are not limited to: allergic reactions (i.e.: anaphylactic or anaphylactoid reaction(s)); adrenal axis suppression; blood sugar elevation that in diabetics may result in ketoacidosis or comma; water retention that in patients with history of congestive heart failure may result in shortness of breath, pulmonary edema, and decompensation with resultant heart failure; weight gain; swelling or edema; medication-induced neural toxicity; particulate matter embolism and blood vessel occlusion with resultant organ, and/or nervous system infarction; and/or aseptic necrosis of one or more joints. Finally, the patient was informed that Medicine is not an exact science; therefore, there is also the possibility of unforeseen or unpredictable risks and/or possible complications that may result in a catastrophic outcome. The patient indicated having understood very clearly. We have given the patient no guarantees and we have made no promises. Enough time was given to the patient to ask  questions, all of which were answered to the patient's satisfaction. [name] has indicated that she wanted to continue with the procedure. Attestation: I, the ordering provider, attest that I have discussed with the patient the benefits, risks, side-effects, alternatives, likelihood of achieving goals, and potential problems during recovery for the procedure that I have provided informed consent. Date  Time: 03/31/2020  9:46 AM  Pre-Procedure Preparation:  Monitoring: As per  clinic protocol. Respiration, ETCO2, SpO2, BP, heart rate and rhythm monitor placed and checked for adequate function Safety Precautions: Patient was assessed for positional comfort and pressure points before starting the procedure. Time-out: I initiated and conducted the "Time-out" before starting the procedure, as per protocol. The patient was asked to participate by confirming the accuracy of the "Time Out" information. Verification of the correct person, site, and procedure were performed and confirmed by me, the nursing staff, and the patient. "Time-out" conducted as per Joint Commission's Universal Protocol (UP.01.01.01). Time:    Description of Procedure:          Laterality: Left Level: L5, S1, S2, & S3 Medial Branch Level(s), at the posterior Sacroiliac Joint Area Prepped: Lumbosacral DuraPrep (Iodine Povacrylex [0.7% available iodine] and Isopropyl Alcohol, 74% w/w) Safety Precautions: Aspiration looking for blood return was conducted prior to all injections. At no point did we inject any substances, as a needle was being advanced. Before injecting, the patient was told to immediately notify me if she was experiencing any new onset of "ringing in the ears, or metallic taste in the mouth". No attempts were made at seeking any paresthesias. Safe injection practices and needle disposal techniques used. Medications properly checked for expiration dates. SDV (single dose vial) medications used. After the completion of the  procedure, all disposable equipment used was discarded in the proper designated medical waste containers. Local Anesthesia: Protocol guidelines were followed. The patient was positioned over the fluoroscopy table. The area was prepped in the usual manner. The time-out was completed. The target area was identified using fluoroscopy. A 12-in long, straight, sterile hemostat was used with fluoroscopic guidance to locate the targets for each level blocked. Once located, the skin was marked with an approved surgical skin marker. Once all sites were marked, the skin (epidermis, dermis, and hypodermis), as well as deeper tissues (fat, connective tissue and muscle) were infiltrated with a small amount of a short-acting local anesthetic, loaded on a 10cc syringe with a 25G, 1.5-in  Needle. An appropriate amount of time was allowed for local anesthetics to take effect before proceeding to the next step. Local Anesthetic: Lidocaine 2.0% The unused portion of the local anesthetic was discarded in the proper designated containers. Technical explanation of process:  Radiofrequency Ablation (RFA) L5 Medial Branch Nerve RFA: The target area for the L5 medial branch is at the junction of the postero-lateral aspect of the superior articular process of S1 and the superior, posterior, and medial edge of the sacral ala. Under fluoroscopic guidance, a Radiofrequency needle was inserted until contact was made with os over the superior postero-lateral aspect of the pedicular shadow (target area). Sensory and motor testing was conducted to properly adjust the position of the needle. Once satisfactory placement of the needle was achieved, the numbing solution was slowly injected after negative aspiration for blood. 2.0 mL of the nerve block solution was injected without difficulty or complication. After waiting for at least 3 minutes, the ablation was performed. Once completed, the needle was removed intact. S1 Primary Dorsal Rami and  Lateral Branch Nerve RFA: The target area for the S1 medial branch is located inferior to the junction of the S1 superior articular process and the L5 inferior articular process, posterior, inferior, and lateral to the 6 o'clock position of the L5-S1 facet joint, just superior to the S1 posterior foramen. Under fluoroscopic guidance, the Radiofrequency needle was advanced until contact was made with os over the Target area. Sensory and motor testing was conducted  to properly adjust the position of the needle. Once satisfactory placement of the needle was achieved, the numbing solution was slowly injected after negative aspiration for blood. 2.0 mL of the nerve block solution was injected without difficulty or complication. After waiting for at least 3 minutes, the ablation was performed. Once completed, the needle was removed intact. S2 Primary Dorsal Rami and Lateral Branch Nerve RFA: The target area for the S2 medial branch is at the posterior superior lateral of the S2 posterior neural foramen. Under fluoroscopic guidance, the Radiofrequency needle was advanced until contact was made with os over the Target area. Sensory and motor testing was conducted to properly adjust the position of the needle. Once satisfactory placement of the needle was achieved, the numbing solution was slowly injected after negative aspiration for blood. 2.0 mL of the nerve block solution was injected without difficulty or complication. After waiting for at least 3 minutes, the ablation was performed. Once completed, the needle was removed intact. S3 Primary Dorsal Rami and Lateral Branch Nerve RFA: The target area for the S3 medial branch is at the posterior superior lateral of the S3 posterior neural foramen. Under fluoroscopic guidance, the Radiofrequency needle was advanced until contact was made with os over the Target area. Sensory and motor testing was conducted to properly adjust the position of the needle. Once satisfactory  placement of the needle was achieved, the numbing solution was slowly injected after negative aspiration for blood. 2.0 mL of the nerve block solution was injected without difficulty or complication. After waiting for at least 3 minutes, the ablation was performed. Once completed, the needle was removed intact. Radiofrequency lesioning (ablation):  Radiofrequency Generator: NeuroTherm [id number] Sensory Stimulation Parameters: 50 Hz was used to locate & identify the nerve, making sure that the needle was positioned such that there was no sensory stimulation below 0.3 V or above 0.7 V. Motor Stimulation Parameters: 2 Hz was used to evaluate the motor component. Care was taken not to lesion any nerves that demonstrated motor stimulation of the lower extremities at an output of less than 2.5 times that of the sensory threshold, or a maximum of 2.0 V. Lesioning Technique Parameters: Standard Radiofrequency settings. (Not bipolar or pulsed.) Temperature Settings: 80 degrees C Lesioning time: 60 seconds Intra-operative Compliance: Compliant Materials & Medications: Needle(s) (Electrode/Cannula) Type: Teflon-coated, curved tip, Radiofrequency needle(s) Gauge: 22G Length: 10cm Numbing solution: 0.2% PF-Ropivacaine + Triamcinolone (40 mg/mL) diluted to a final concentration of 4 mg of Triamcinolone/mL of Ropivacaine The unused portion of the solution was discarded in the proper designated containers.   Once the entire procedure was completed, the treated area was cleaned, making sure to leave some of the prepping solution back to take advantage of its long term bactericidal properties. Intra-operative Compliance: Compliant  Vitals:   03/31/20 1127 03/31/20 1137 03/31/20 1147 03/31/20 1157  BP: (!) 101/59 (!) 136/54 128/78 (!) 130/58  Pulse: (!) 52     Resp: 11 11 14 15   Temp:  (!) 97.4 F (36.3 C)  (!) 97.3 F (36.3 C)  SpO2: 97% 98% 98% 98%  Weight:      Height:        Start Time:   hrs. End  Time: 1125 hrs.  Imaging Guidance (Non-Spinal):          Type of Imaging Technique: Fluoroscopy Guidance (Non-Spinal) Indication(s): Assistance in needle guidance and placement for procedures requiring needle placement in or near specific anatomical locations not easily accessible without such assistance. Exposure Time:  Please see nurses notes. Contrast: Before injecting any contrast, we confirmed that the patient did not have an allergy to iodine, shellfish, or radiological contrast. Once satisfactory needle placement was completed at the desired level, radiological contrast was injected. Contrast injected under live fluoroscopy. No contrast complications. See chart for type and volume of contrast used. Fluoroscopic Guidance: I was personally present during the use of fluoroscopy. "Tunnel Vision Technique" used to obtain the best possible view of the target area. Parallax error corrected before commencing the procedure. "Direction-depth-direction" technique used to introduce the needle under continuous pulsed fluoroscopy. Once target was reached, antero-posterior, oblique, and lateral fluoroscopic projection used confirm needle placement in all planes. Images permanently stored in EMR. Interpretation: I personally interpreted the imaging intraoperatively. Adequate needle placement confirmed in multiple planes. Appropriate spread of contrast into desired area was observed. No evidence of afferent or efferent intravascular uptake. Permanent images saved into the patient's record.  Antibiotic Prophylaxis:   Anti-infectives (From admission, onward)   None     Indication(s): None identified  Post-operative Assessment:  Post-procedure Vital Signs:  Pulse/HCG Rate: (!) 52 (SB)60 Temp: (!) 97.3 F (36.3 C) Resp: 15 BP: (!) 130/58 SpO2: 98 %  EBL: None  Complications: No immediate post-treatment complications observed by team, or reported by patient.  Note: The patient tolerated the entire  procedure well. A repeat set of vitals were taken after the procedure and the patient was kept under observation following institutional policy, for this type of procedure. Post-procedural neurological assessment was performed, showing return to baseline, prior to discharge. The patient was provided with post-procedure discharge instructions, including a section on how to identify potential problems. Should any problems arise concerning this procedure, the patient was given instructions to immediately contact us, at any time, without hesitation. In any case, we plan to contact the patient by telephone for a follow-up status report regarding this interventional procedure.  Comments:  No additional relevant information.  Plan of Care  Orders:  Orders Placed This Encounter  Procedures  . Radiofrequency Sacroiliac Joint    Scheduling Instructions:     Side(s): Left-sided     Level(s): L4, L5, S1, S2, & S3 Medial Branch Nerve(s)     Sedation: With Sedation.     Timeframe: Today     Procedure: Sacroiliac joint RFA  . DG PAIN CLINIC C-ARM 1-60 MIN NO REPORT    Intraoperative interpretation by procedural physician at [place].    Standing Status:   Standing    Number of Occurrences:   1    Order Specific Question:   Reason for exam:    Answer:   Assistance in needle guidance and placement for procedures requiring needle placement in or near specific anatomical locations not easily accessible without such assistance.  . Informed Consent Details: Physician/Practitioner Attestation; Transcribe to consent form and obtain patient signature    Nursing Order: Transcribe to consent form and obtain patient signature. Note: Always confirm laterality of pain with Ms. Lilia Pro, before procedure.    Order Specific Question:   Physician/Practitioner attestation of informed consent for procedure/surgical case    Answer:   I, the physician/practitioner, attest that I have discussed with the patient the  benefits, risks, side effects, alternatives, likelihood of achieving goals and potential problems during recovery for the procedure that I have provided informed consent.    Order Specific Question:   Procedure    Answer:   Sacroiliac joint radiofrequency ablation    Order Specific Question:  Physician/Practitioner performing the procedure    Answer:   Lealman. Dossie Arbour, MD    Order Specific Question:   Indication/Reason    Answer:   Chronic sacroiliac joint pain  . Provide equipment / supplies at bedside    "Radiofrequency Tray"; Large hemostat (x1); Small hemostat (x1); Towels (x8); 4x4 sterile sponge pack (x1) Needle type: Teflon-coated Radiofrequency Needle (Disposable  single use) Size: Regular Quantity: 5    Standing Status:   Standing    Number of Occurrences:   1    Order Specific Question:   Specify    Answer:   Radiofrequency Tray   Chronic Opioid Analgesic:  No opioid analgesics from our practice.   Medications ordered for procedure: Meds ordered this encounter  Medications  . lidocaine (XYLOCAINE) 2 % (with pres) injection 400 mg  . lactated ringers infusion 1,000 mL  . midazolam (VERSED) 5 MG/5ML injection 1-2 mg    Make sure Flumazenil is available in the pyxis when using this medication. If oversedation occurs, administer 0.2 mg IV over 15 sec. If after 45 sec no response, administer 0.2 mg again over 1 min; may repeat at 1 min intervals; not to exceed 4 doses (1 mg)  . fentaNYL (SUBLIMAZE) injection 25-50 mcg    Make sure Narcan is available in the pyxis when using this medication. In the event of respiratory depression (RR< 8/min): Titrate NARCAN (naloxone) in increments of 0.1 to 0.2 mg IV at 2-3 minute intervals, until desired degree of reversal.  . ropivacaine (PF) 2 mg/mL (0.2%) (NAROPIN) injection 9 mL  . triamcinolone acetonide (KENALOG-40) injection 40 mg  . oxyCODONE-acetaminophen (PERCOCET) 5-325 MG tablet    Sig: Take 1 tablet by mouth every 6 (six)  hours as needed for up to 7 days for severe pain. Must last 7 days.    Dispense:  28 tablet    Refill:  0    For acute post-operative pain. Not to be refilled. Most last 7 days.  Marland Kitchen oxyCODONE-acetaminophen (PERCOCET) 5-325 MG tablet    Sig: Take 1 tablet by mouth every 6 (six) hours as needed for up to 7 days for severe pain. Must last 7 days.    Dispense:  28 tablet    Refill:  0    For acute post-operative pain. Not to be refilled. Must last 7 days.   Medications administered: We administered lidocaine, lactated ringers, midazolam, fentaNYL, ropivacaine (PF) 2 mg/mL (0.2%), and triamcinolone acetonide.  See the medical record for exact dosing, route, and time of administration.  Follow-up plan:   Return in about 6 weeks (around 05/12/2020) for (F2F), (PP) Follow-up.      Interventional Therapies  Risk  Complexity Considerations:   WNL   Planned  Pending:   Pending further evaluation   Under consideration:   Diagnostic bilateral hip joint injection Possible bilateral femoral +obturator NB Possible bilateral femoral + obturator nerve RFA Diagnostic right L4-5 LESI #2   Completed:   Diagnostic/therapeutic right L4-5 LESI x1 (01/22/2019) (50/50/90 x2 weeks) Diagnostic bilateral SI joint block x2 (02/09/2020) (100/100/80) (100/100/100/50)  Therapeutic right SI RFA x1 (03/17/2020) Diagnostic bilateral lumbar facet block x2 (01/22/2017; 04/18/2017) (100/100/90/90) (100/90/100/100)  Palliative bilateral lumbar facet block x3 (last done 10/09/2018) (90 to 100% relief averaging duration 3 to 8 mo.) Therapeutic right lumbar facet RFA x1 (07/21/2019) (100/100/75)  Therapeutic left lumbar facet RFA x1 (08/06/2019)  (100/100/75)    Palliative options:   Palliativebilateral lumbar facet block#6 Palliative right lumbar facet RFA #2  Palliative  left lumbar facet RFA #2  Palliative bilateral SI joint block #3     Recent Visits Date Type Provider Dept  03/17/20 Procedure visit  Milinda Pointer, MD [facility]  02/29/20 Office Visit Milinda Pointer, MD [facility]  02/09/20 Procedure visit Milinda Pointer, MD [facility]  01/13/20 Telemedicine Milinda Pointer, MD [facility]  Showing recent visits within past 90 days and meeting all other requirements Today's Visits Date Type Provider Dept  03/31/20 Procedure visit Milinda Pointer, MD [facility]  Showing today's visits and meeting all other requirements Future Appointments Date Type Provider Dept  05/09/20 Appointment Milinda Pointer, MD [facility]  Showing future appointments within next 90 days and meeting all other requirements  Disposition: Discharge home  Discharge (Date  Time): 03/31/2020; 1200 hrs.   Primary Care Physician: Virginia Crews, MD Location: [facility] Outpatient Pain Management Facility Note by: Gaspar Cola, MD Date: 03/31/2020; Time: 1:03 PM  Disclaimer:  Medicine is not an Chief Strategy Officer. The only guarantee in medicine is that nothing is guaranteed. It is important to note that the decision to proceed with this intervention was based on the information collected from the patient. The Data and conclusions were drawn from the patient's questionnaire, the interview, and the physical examination. Because the information was provided in large part by the patient, it cannot be guaranteed that it has not been purposely or unconsciously manipulated. Every effort has been made to obtain as much relevant data as possible for this evaluation. It is important to note that the conclusions that lead to this procedure are derived in large part from the available data. Always take into account that the treatment will also be dependent on availability of resources and existing treatment guidelines, considered by other Pain Management Practitioners as being common knowledge and practice, at the time of the intervention. For  Medico-Legal purposes, it is also important to point out that variation in procedural techniques and pharmacological choices are the acceptable norm. The indications, contraindications, technique, and results of the above procedure should only be interpreted and judged by a Board-Certified Interventional Pain Specialist with extensive familiarity and expertise in the same exact procedure and technique.

## 2020-04-01 ENCOUNTER — Telehealth: Payer: Self-pay | Admitting: *Deleted

## 2020-04-01 NOTE — Telephone Encounter (Signed)
Voicemail left with patient post procedure.  Asked to call if there are any questions or concerns re; procedure on yesterday.

## 2020-04-25 ENCOUNTER — Other Ambulatory Visit: Payer: Self-pay | Admitting: Family Medicine

## 2020-04-25 NOTE — Telephone Encounter (Signed)
Requested medication (s) are due for refill today: expired medication  Requested medication (s) are on the active medication list: yes  Last refill:  03/30/2019 #90 3 refills   Future visit scheduled: no   Notes to clinic:  expired medication, Do you want to renew Rx?     Requested Prescriptions  Pending Prescriptions Disp Refills   montelukast (SINGULAIR) 10 MG tablet [Pharmacy Med Name: MONTELUKAST SODIUM 10 MG TAB] 90 tablet 3    Sig: TAKE 1 TABLET BY MOUTH AT BEDTIME      Pulmonology:  Leukotriene Inhibitors Passed - 04/25/2020  9:24 AM      Passed - Valid encounter within last 12 months    Recent Outpatient Visits           3 months ago Type 2 diabetes mellitus with other specified complication, without long-term current use of insulin [facility])   [facility], Marzella Schlein, MD   9 months ago Type 2 diabetes mellitus with other specified complication, without long-term current use of insulin [facility])   [facility], Marzella Schlein, MD   9 months ago [employer] preventive visit   [facility], Marzella Schlein, MD   1 year ago Chronic cough   [facility], Marzella Schlein, MD   1 year ago Hypertension associated with diabetes [facility])   [facility], Marzella Schlein, MD

## 2020-04-27 ENCOUNTER — Ambulatory Visit: Payer: Medicare Other | Admitting: Pain Medicine

## 2020-05-08 NOTE — Progress Notes (Signed)
PROVIDER NOTE: Information contained herein reflects review and annotations entered in association with encounter. Interpretation of such information and data should be left to medically-trained personnel. Information provided to patient can be located elsewhere in the medical record under "Patient Instructions". Document created using STT-dictation technology, any transcriptional errors that may result from process are unintentional.    Patient: Ashley Rodriguez  Service Category: E/M  Provider: Gaspar Cola, MD  DOB: 1951-02-02  DOS: 05/09/2020  Specialty: Interventional Pain Management  MRN: [id number]  Setting: Ambulatory outpatient  PCP: Ashley Crews, MD  Type: Established Patient    Referring Provider: Virginia Crews, MD  Location: Office  Delivery: Face-to-face     HPI  [name], a 70 y.o. year old female, is here today because of her Chronic pain syndrome [G89.4]. [name] primary complain today is Back Pain (lower) Last encounter: My last encounter with her was on 03/31/2020. Pertinent problems: [name] has Psoriatic arthritis ([place]); Chronic low back pain (1ry area of Pain) (Bilateral) (R>L) w/ sciatica (Bilateral); Chronic lower extremity pain (2ry area of Pain) (Bilateral) (R>L); Grade 1 Anterolisthesis of L5 over S1; L5 pars defect with spondylolisthesis (Bilateral); DDD (degenerative disc disease), lumbar; Lumbar facet syndrome (Bilateral) (R>L); Chronic pain syndrome; Chronic musculoskeletal pain; Neurogenic pain; Chronic sacroiliac joint pain (Bilateral) (R>L); Osteoarthritis of lumbar spine; Spondylosis without myelopathy or radiculopathy, lumbosacral region; Peripheral neuropathy; Generalized osteoarthritis of hand; Numbness and tingling of foot; Low back pain radiating to both legs; Abnormal EMG (chronic right lower lumbar polyradiculopathy); Chronic lower lumbar polyradiculopathy (Right); DDD (degenerative disc disease), lumbosacral; Lumbar spondylosis w/  polyradiculopathy (Right); Lumbosacral radiculopathy (Right); Other intervertebral disc degeneration, lumbar region; Spondylolisthesis of lumbosacral region; Acute postoperative pain; Other spondylosis, sacral and sacrococcygeal region; Somatic dysfunction of sacroiliac joints (Bilateral); Osteoarthritis of sacroiliac joints (Bilateral); Myalgia due to statin; Chronic sacroiliac joint pain (Right); Osteoarthritis of sacroiliac joint ([place]) (Right); Enthesopathy of sacroiliac joint; Chronic low back pain (Bilateral) w/o sciatica; Chronic sacroiliac joint pain (Left); Osteoarthritis of sacroiliac joint (Left) ([place]); and Somatic dysfunction of sacroiliac joint (Left) on their pertinent problem list. Pain Assessment: Severity of Chronic pain is reported as a 1  (each morniong the pain is 6)/10. Location: Back Lower/Denies. Onset: More than a month ago. Quality: Aching,Burning. Timing: Intermittent. Modifying factor(s): procedures, heat and meds. Vitals:  height is _0  (1.575 m) and weight is 142 lb (64.4 kg). Her temperature is 97.6 F (36.4 C). Her blood pressure is 156/74 (abnormal) and her pulse is 63. Her oxygen saturation is 98%.   Reason for encounter: post-procedure assessment.  The patient indicates that the procedure has provided her with an ongoing 70% relief of the pain in the area.  At this point, she is still having some pain in the morning, that will last for about an hour, but after that she seems to be okay.  Since her pain seems to be under control now, we will continue to monitor and should it come back, we will consider repeat treatment.  For the time being, the patient has met her goal of avoiding further surgery.  Post-Procedure Evaluation  Procedure (03/31/2020): Therapeutic left sacroiliac joint RFA #1 under fluoroscopic guidance and IV sedation Pre-procedure pain level: 2/10 Post-procedure: 0/10 (100% relief)  Sedation: Sedation provided.  Effectiveness during initial hour after  procedure(Ultra-Short Term Relief): 100 %.  Local anesthetic used: Long-acting (4-6 hours) Effectiveness: Defined as any analgesic benefit obtained secondary to the administration of local anesthetics. This carries significant diagnostic value  as to the etiological location, or anatomical origin, of the pain. Duration of benefit is expected to coincide with the duration of the local anesthetic used.  Effectiveness during initial 4-6 hours after procedure(Short-Term Relief): 100 %.  Long-term benefit: Defined as any relief past the pharmacologic duration of the local anesthetics.  Effectiveness past the initial 6 hours after procedure(Long-Term Relief): 100 % (lasted for 3 week, pain is back but not like it was).  Current benefits: Defined as benefit that persist at this time.   Analgesia:  70% relief Function: [name] reports improvement in function ROM: [name] reports improvement in ROM   Pharmacotherapy Assessment   Analgesic: No opioid analgesics from our practice.   Monitoring: [place] PMP: PDMP reviewed during this encounter.       Pharmacotherapy: No side-effects or adverse reactions reported. Compliance: No problems identified. Effectiveness: Clinically acceptable.  Ashley Fischer, RN  05/09/2020 11:37 AM  Sign when Signing Visit Safety precautions to be maintained throughout the outpatient stay will include: orient to surroundings, keep bed in low position, maintain call bell within reach at all times, provide assistance with transfer out of bed and ambulation.     UDS: No results found for: SUMMARY   ROS  Constitutional: Denies any fever or chills Gastrointestinal: No reported hemesis, hematochezia, vomiting, or acute GI distress Musculoskeletal: Denies any acute onset joint swelling, redness, loss of ROM, or weakness Neurological: No reported episodes of acute onset apraxia, aphasia, dysarthria, agnosia, amnesia, paralysis, loss of coordination, or loss of  consciousness  Medication Review  Red Yeast Rice, Turmeric, acetaminophen, amLODipine, atenolol, betamethasone dipropionate, calcium carbonate, co-enzyme Q-10, esomeprazole, etanercept, fexofenadine, fluticasone, gabapentin, losartan, meloxicam, montelukast, multivitamin with minerals, and valACYclovir  History Review  Allergy: [name] is allergic to atorvastatin, codeine, and sulfa antibiotics. Drug: [name]  reports no history of drug use. Alcohol:  reports current alcohol use of about 4.0 standard drinks of alcohol per week. Tobacco:  reports that she has never smoked. She has never used smokeless tobacco. Social: [name]  reports that she has never smoked. She has never used smokeless tobacco. She reports current alcohol use of about 4.0 standard drinks of alcohol per week. She reports that she does not use drugs. Medical:  has a past medical history of COVID-19 (12/15/2018), Flank lipoma (08/17/2016), Hypertension, Neck mass (04/07/2013), Psoriatic arthritis ([place]), Recurrent cellulitis, and T2DM (type 2 diabetes mellitus) ([place]) (10/01/2018). Surgical: [name]  has a past surgical history that includes Abdominal hysterectomy (2000); lipoma removed (04/2012); Colonoscopy (2015); and Neck mass excision (04/28/14). Family: family history includes Healthy in her mother.  Laboratory Chemistry Profile   Renal Lab Results  Component Value Date   BUN 16 01/25/2020   CREATININE 0.64 01/25/2020   BCR 25 01/25/2020   GFRAA 107 01/25/2020   GFRNONAA 93 01/25/2020     Hepatic Lab Results  Component Value Date   AST 24 01/25/2020   ALT 24 01/25/2020   ALBUMIN 4.9 (H) 01/25/2020   ALKPHOS 74 01/25/2020     Electrolytes Lab Results  Component Value Date   NA 140 01/25/2020   K 4.4 01/25/2020   CL 100 01/25/2020   CALCIUM 10.1 01/25/2020   MG 2.1 01/03/2017     Bone Lab Results  Component Value Date   25OHVITD1 50 01/03/2017   25OHVITD2 <1.0 01/03/2017   25OHVITD3 50  01/03/2017     Inflammation (CRP: Acute Phase) (ESR: Chronic Phase) Lab Results  Component Value Date  CRP 0.8 01/03/2017   ESRSEDRATE 2 01/25/2020       Note: Above Lab results reviewed.  Recent Imaging Review  MM 3D SCREEN BREAST BILATERAL CLINICAL DATA:  Screening.  EXAM: DIGITAL SCREENING BILATERAL MAMMOGRAM WITH TOMO AND CAD  COMPARISON:  Previous exam(s).  ACR Breast Density Category b: There are scattered areas of fibroglandular density.  FINDINGS: There are no findings suspicious for malignancy. Images were processed with CAD.  IMPRESSION: No mammographic evidence of malignancy. A result letter of this screening mammogram will be mailed directly to the patient.  RECOMMENDATION: Screening mammogram in one year. (Code:[id number]-01Y)  BI-RADS CATEGORY  1: Negative.  Electronically Signed   By: Everlean Alstrom M.D.   On: 03/31/2020 12:57 [facility] C-ARM 1-60 MIN NO REPORT Fluoro was used, but no Radiologist interpretation will be provided.  Please refer to "NOTES" tab for provider progress note. Note: Reviewed        Physical Exam  General appearance: Well nourished, well developed, and well hydrated. In no apparent acute distress Mental status: Alert, oriented x 3 (person, place, & time)       Respiratory: No evidence of acute respiratory distress Eyes: PERLA Vitals: BP (!) 156/74    Pulse 63    Temp 97.6 F (36.4 C)    Ht _0  (1.575 m)    Wt 142 lb (64.4 kg)    SpO2 98%    BMI 25.97 kg/m  BMI: Estimated body mass index is 25.97 kg/m as calculated from the following:   Height as of this encounter: _1  (1.575 m).   Weight as of this encounter: 142 lb (64.4 kg). Ideal: Ideal body weight: 50.1 kg (110 lb 7.2 oz) Adjusted ideal body weight: 55.8 kg (123 lb 1.1 oz)  Assessment   Status Diagnosis  Controlled Controlled Controlled 1. Chronic pain syndrome   2. Chronic sacroiliac joint pain (Left)   3. Chronic low back pain (Bilateral) w/o  sciatica   4. Lumbar facet syndrome (Bilateral) (R>L)      Updated Problems: No problems updated.  Plan of Care  Problem-specific:  No problem-specific Assessment & Plan notes found for this encounter.  [name] has a current medication list which includes the following long-term medication(s): amlodipine, atenolol, calcium carbonate, enbrel sureclick, esomeprazole, fexofenadine, fluticasone, gabapentin, losartan, and montelukast.  Pharmacotherapy (Medications Ordered): No orders of the defined types were placed in this encounter.  Orders:  No orders of the defined types were placed in this encounter.  Follow-up plan:   Return if symptoms worsen or fail to improve.      Interventional Therapies  Risk   Complexity Considerations:   WNL   Planned   Pending:   Pending further evaluation   Under consideration:   Diagnostic bilateral hip joint injection Possible bilateral femoral +obturator NB Possible bilateral femoral + obturator nerve RFA Diagnostic right L4-5 LESI #2   Completed:   Diagnostic/therapeutic right L4-5 LESI x1 (01/22/2019) (50/50/90 x2 weeks) Diagnostic bilateral SI joint block x2 (02/09/2020) (100/100/80) (100/100/100/50)  Therapeutic right SI RFA x1 (03/17/2020) Diagnostic bilateral lumbar facet block x2 (01/22/2017; 04/18/2017) (100/100/90/90) (100/90/100/100)  Palliative bilateral lumbar facet block x3 (last done 10/09/2018) (90 to 100% relief averaging duration 3 to 8 mo.) Therapeutic right lumbar facet RFA x1 (07/21/2019) (100/100/75)  Therapeutic left lumbar facet RFA x1 (08/06/2019)  (100/100/75)    Palliative options:   Palliativebilateral lumbar facet block#6 Palliative right lumbar facet RFA #2  Palliative left lumbar facet RFA #2  Palliative bilateral SI joint block #3      Recent Visits Date Type Provider Dept  03/31/20 Procedure visit Milinda Pointer, MD [facility]  03/17/20 Procedure visit Milinda Pointer, MD  [facility]  02/29/20 Office Visit Milinda Pointer, MD [facility]  02/09/20 Procedure visit Milinda Pointer, MD [facility]  Showing recent visits within past 90 days and meeting all other requirements Today's Visits Date Type Provider Dept  05/09/20 Office Visit Milinda Pointer, MD [facility]  Showing today's visits and meeting all other requirements Future Appointments No visits were found meeting these conditions. Showing future appointments within next 90 days and meeting all other requirements  I discussed the assessment and treatment plan with the patient. The patient was provided an opportunity to ask questions and all were answered. The patient agreed with the plan and demonstrated an understanding of the instructions.  Patient advised to call back or seek an in-person evaluation if the symptoms or condition worsens.  Duration of encounter: 30 minutes.  Note by: Ashley Cola, MD Date: 05/09/2020; Time: 12:17 PM

## 2020-05-09 ENCOUNTER — Other Ambulatory Visit: Payer: Self-pay

## 2020-05-09 ENCOUNTER — Ambulatory Visit: Payer: Medicare Other | Attending: Pain Medicine | Admitting: Pain Medicine

## 2020-05-09 ENCOUNTER — Encounter: Payer: Self-pay | Admitting: Pain Medicine

## 2020-05-09 VITALS — BP 156/74 | HR 63 | Temp 97.6°F | Ht 62.0 in | Wt 142.0 lb

## 2020-05-09 DIAGNOSIS — G8929 Other chronic pain: Secondary | ICD-10-CM | POA: Diagnosis not present

## 2020-05-09 DIAGNOSIS — G894 Chronic pain syndrome: Secondary | ICD-10-CM | POA: Diagnosis not present

## 2020-05-09 DIAGNOSIS — M533 Sacrococcygeal disorders, not elsewhere classified: Secondary | ICD-10-CM

## 2020-05-09 DIAGNOSIS — M47816 Spondylosis without myelopathy or radiculopathy, lumbar region: Secondary | ICD-10-CM | POA: Insufficient documentation

## 2020-05-09 DIAGNOSIS — M545 Low back pain, unspecified: Secondary | ICD-10-CM | POA: Insufficient documentation

## 2020-05-09 NOTE — Progress Notes (Signed)
Safety precautions to be maintained throughout the outpatient stay will include: orient to surroundings, keep bed in low position, maintain call bell within reach at all times, provide assistance with transfer out of bed and ambulation.  

## 2020-05-09 NOTE — Patient Instructions (Signed)
____________________________________________________________________________________________  CBD (cannabidiol) WARNING  Applicable to: All individuals currently taking or considering taking CBD (cannabidiol) and, more important, all patients taking opioid analgesic controlled substances (pain medication). (Example: oxycodone; oxymorphone; hydrocodone; hydromorphone; morphine; methadone; tramadol; tapentadol; fentanyl; buprenorphine; butorphanol; dextromethorphan; meperidine; codeine; etc.)  Legal status: CBD remains a Schedule I drug prohibited for any use. CBD is illegal with one exception. In the [place], CBD has a limited Transport planner (FDA) approval for the treatment of two specific types of epilepsy disorders. Only one CBD product has been approved by the FDA for this purpose: "Epidiolex". FDA is aware that some companies are marketing products containing cannabis and cannabis-derived compounds in ways that violate the [employer], Drug and Cosmetic Act [facility] Act) and that may put the health and safety of consumers at risk. The FDA, a Federal agency, has not enforced the CBD status since 2018.   Legality: Some manufacturers ship CBD products nationally, which is illegal. Often such products are sold online and are therefore available throughout the country. CBD is openly sold in head shops and health food stores in some states where such sales have not been explicitly legalized. Selling unapproved products with unsubstantiated therapeutic claims is not only a violation of the law, but also can put patients at risk, as these products have not been proven to be safe or effective. Federal illegality makes it difficult to conduct research on CBD.  Reference: "FDA Regulation of Cannabis and Cannabis-Derived Products, Including Cannabidiol (CBD)" -  [employer].pt  Warning: CBD is not FDA approved and has not undergo the same manufacturing controls as prescription drugs.  This means that the purity and safety of available CBD may be questionable. Most of the time, despite manufacturer's claims, it is contaminated with THC (delta-9-tetrahydrocannabinol - the chemical in marijuana responsible for the "HIGH").  When this is the case, the [facility] contaminant will trigger a positive urine drug screen (UDS) test for Marijuana (carboxy-THC). Because a positive UDS for any illicit substance is a violation of our medication agreement, your opioid analgesics (pain medicine) may be permanently discontinued.  MORE ABOUT CBD  General Information: CBD  is a derivative of the Marijuana (cannabis sativa) plant discovered in 64. It is one of the 113 identified substances found in Marijuana. It accounts for up to 40% of the plant's extract. As of 2018, preliminary clinical studies on CBD included research for the treatment of anxiety, movement disorders, and pain. CBD is available and consumed in multiple forms, including inhalation of smoke or vapor, as an aerosol spray, and by mouth. It may be supplied as an oil containing CBD, capsules, dried cannabis, or as a liquid solution. CBD is thought not to be as psychoactive as THC (delta-9-tetrahydrocannabinol - the chemical in marijuana responsible for the "HIGH"). Studies suggest that CBD may interact with different biological target receptors in the body, including cannabinoid and other neurotransmitter receptors. As of 2018 the mechanism of action for its biological effects has not been determined.  Side-effects  Adverse reactions: Dry mouth, diarrhea, decreased appetite, fatigue, drowsiness, malaise, weakness, sleep disturbances, and others.  Drug interactions: CBC may interact with other medications  such as blood-thinners. (Last update: 12/05/2019) ____________________________________________________________________________________________

## 2020-05-11 ENCOUNTER — Other Ambulatory Visit: Payer: Self-pay | Admitting: Family Medicine

## 2020-05-17 DIAGNOSIS — M5136 Other intervertebral disc degeneration, lumbar region: Secondary | ICD-10-CM | POA: Diagnosis not present

## 2020-05-17 DIAGNOSIS — M159 Polyosteoarthritis, unspecified: Secondary | ICD-10-CM | POA: Diagnosis not present

## 2020-05-17 DIAGNOSIS — L409 Psoriasis, unspecified: Secondary | ICD-10-CM | POA: Diagnosis not present

## 2020-05-17 DIAGNOSIS — L4050 Arthropathic psoriasis, unspecified: Secondary | ICD-10-CM | POA: Diagnosis not present

## 2020-05-19 LAB — HEPATIC FUNCTION PANEL
ALT: 20 (ref 7–35)
AST: 23 (ref 13–35)
Alkaline Phosphatase: 57 (ref 25–125)
Bilirubin, Total: 0.5

## 2020-05-19 LAB — COMPREHENSIVE METABOLIC PANEL
Albumin: 4.6 (ref 3.5–5.0)
Calcium: 9.6 (ref 8.7–10.7)
GFR calc Af Amer: 94
GFR calc non Af Amer: 81
Globulin: 2.4

## 2020-05-19 LAB — CBC AND DIFFERENTIAL
HCT: 43 (ref 36–46)
Hemoglobin: 14.6 (ref 12.0–16.0)
Platelets: 395 (ref 150–399)
WBC: 5.1

## 2020-05-19 LAB — BASIC METABOLIC PANEL
BUN: 13 (ref 4–21)
CO2: 26 — AB (ref 13–22)
Chloride: 101 (ref 99–108)
Creatinine: 0.8 (ref 0.5–1.1)
Glucose: 107
Potassium: 4.8 (ref 3.4–5.3)
Sodium: 141 (ref 137–147)

## 2020-05-19 LAB — CBC: RBC: 4.41 (ref 3.87–5.11)

## 2020-06-06 DIAGNOSIS — D1801 Hemangioma of skin and subcutaneous tissue: Secondary | ICD-10-CM | POA: Diagnosis not present

## 2020-06-06 DIAGNOSIS — Z85828 Personal history of other malignant neoplasm of skin: Secondary | ICD-10-CM | POA: Diagnosis not present

## 2020-06-06 DIAGNOSIS — Z808 Family history of malignant neoplasm of other organs or systems: Secondary | ICD-10-CM | POA: Diagnosis not present

## 2020-06-06 DIAGNOSIS — D485 Neoplasm of uncertain behavior of skin: Secondary | ICD-10-CM | POA: Diagnosis not present

## 2020-06-06 DIAGNOSIS — D2339 Other benign neoplasm of skin of other parts of face: Secondary | ICD-10-CM | POA: Diagnosis not present

## 2020-06-09 ENCOUNTER — Encounter: Payer: Self-pay | Admitting: Family Medicine

## 2020-06-09 MED ORDER — AMLODIPINE BESYLATE 5 MG PO TABS
ORAL_TABLET | ORAL | 3 refills | Status: DC
Start: 2020-06-09 — End: 2021-08-01

## 2020-06-21 ENCOUNTER — Ambulatory Visit: Payer: Self-pay | Admitting: Family Medicine

## 2020-06-21 ENCOUNTER — Encounter: Payer: Self-pay | Admitting: Family Medicine

## 2020-06-21 NOTE — Telephone Encounter (Signed)
Can offer virtual visit if she would like.  Likely viral given URI symptoms that accompany it.  If develops severe abdominal pain, needs to seek immediate care.

## 2020-06-21 NOTE — Telephone Encounter (Signed)
Pt reports 1 episode of diarrhea Sunday, loose. HAd 1 episode of vomiting yesterday. HAs been able to drink, keeping down crackers, bland food this AM. Nausea remains, denies fever, denies abdominal pain, no new meds.  Reports vomited yesterday after eating [place], "Had little red specks in it but might have been the food I had just eaten." Mild dizziness yesterday, none today "But I feel weak and haven't been out of bed yet.' Pt states she is voiding as usual, urine light, clear. Also reports sinus pressure and mild sore throat. Pt declined VV. States "I tested negative for covid on a home test last week." Advised NT would route to practice for PCPs review.  Home care advise per protocol. Pt verbalizes understanding CB# [phone].   Reason for Disposition . MILD-MODERATE diarrhea (e.g., 1-6 times / day more than normal)  Answer Assessment - Initial Assessment Questions 1. DIARRHEA SEVERITY: "How bad is the diarrhea?" "How many extra stools have you had in the past 24 hours than normal?"    - NO DIARRHEA (SCALE 0)   - MILD (SCALE 1-3): Few loose or mushy BMs; increase of 1-3 stools over normal daily number of stools; mild increase in ostomy output.   -  MODERATE (SCALE 4-7): Increase of 4-6 stools daily over normal; moderate increase in ostomy output. * SEVERE (SCALE 8-10; OR 'WORST POSSIBLE'): Increase of 7 or more stools daily over normal; moderate increase in ostomy output; incontinence.    1 episode Sunday 2. ONSET: "When did the diarrhea begin?"      Only one day 3. BM CONSISTENCY: "How loose or watery is the diarrhea?"      Loose 4. VOMITING: "Are you also vomiting?" If Yes, ask: "How many times in the past 24 hours?"      1 episode of vomiting yesterday, "Had red flecks in it, like blood." 5. ABDOMINAL PAIN: "Are you having any abdominal pain?" If Yes, ask: "What does it feel like?" (e.g., crampy, dull, intermittent, constant)      No pain, nausea 6. ABDOMINAL PAIN SEVERITY: If  present, ask: "How bad is the pain?"  (e.g., Scale 1-10; mild, moderate, or severe)   - MILD (1-3): doesn't interfere with normal activities, abdomen soft and not tender to touch    - MODERATE (4-7): interferes with normal activities or awakens from sleep, tender to touch    - SEVERE (8-10): excruciating pain, doubled over, unable to do any normal activities       NA 7. ORAL INTAKE: If vomiting, "Have you been able to drink liquids?" "How much fluids have you had in the past 24 hours?"     Eating small amounts, keeping fluids down. 8. HYDRATION: "Any signs of dehydration?" (e.g., dry mouth [not just dry lips], too weak to stand, dizziness, new weight loss) "When did you last urinate?"     Mouth and lips dry, dizziness 9. EXPOSURE: "Have you traveled to a foreign country recently?" "Have you been exposed to anyone with diarrhea?" "Could you have eaten any food that was spoiled?"     no 10. ANTIBIOTIC USE: "Are you taking antibiotics now or have you taken antibiotics in the past 2 months?"       no 11. OTHER SYMPTOMS: "Do you have any other symptoms?" (e.g., fever, blood in stool)      Ears and throat , "Feels funny like pressure."  Protocols used: DIARRHEA-A-AH

## 2020-06-22 ENCOUNTER — Other Ambulatory Visit: Payer: Self-pay | Admitting: Podiatry

## 2020-06-22 ENCOUNTER — Other Ambulatory Visit: Payer: Self-pay

## 2020-06-22 ENCOUNTER — Ambulatory Visit: Payer: Medicare Other | Admitting: Podiatry

## 2020-06-22 ENCOUNTER — Encounter: Payer: Self-pay | Admitting: Podiatry

## 2020-06-22 ENCOUNTER — Ambulatory Visit (INDEPENDENT_AMBULATORY_CARE_PROVIDER_SITE_OTHER): Payer: Medicare Other

## 2020-06-22 DIAGNOSIS — M7741 Metatarsalgia, right foot: Secondary | ICD-10-CM | POA: Diagnosis not present

## 2020-06-22 DIAGNOSIS — M778 Other enthesopathies, not elsewhere classified: Secondary | ICD-10-CM

## 2020-06-22 DIAGNOSIS — G5791 Unspecified mononeuropathy of right lower limb: Secondary | ICD-10-CM | POA: Diagnosis not present

## 2020-06-22 DIAGNOSIS — G629 Polyneuropathy, unspecified: Secondary | ICD-10-CM

## 2020-06-22 MED ORDER — TRIAMCINOLONE ACETONIDE 40 MG/ML IJ SUSP: 60.0000 mg | Freq: Once | INTRAMUSCULAR | Status: AC

## 2020-06-22 MED ORDER — GABAPENTIN 300 MG PO CAPS: ORAL_CAPSULE | ORAL | 3 refills | Status: DC

## 2020-06-22 NOTE — Progress Notes (Signed)
She presents today for follow-up of her peripheral neuropathy.  With tenderness between her second and third toes on her right foot.  Objective: Vital signs are stable she is alert oriented x3.  Pulses are palpable.  She has pain on end range of motion of the second and third metatarsophalangeal joints.  She has a palpable Mulder's click third interdigital space.  Assessment: Peripheral neuropathy neuroma neuritis capsulitis.  Plan: I injected 5 mg of Kenalog 5 mg of Marcaine to the second and third interdigital spaces of the right foot respectively.  Also changed her gabapentin 300 mg tablets 2 in the morning and 3 at night.  1500 mg a day.

## 2020-06-22 NOTE — Telephone Encounter (Signed)
Left detailed message for patient to call back if she needs a sooner appt than 06/24/20.

## 2020-06-24 ENCOUNTER — Telehealth (INDEPENDENT_AMBULATORY_CARE_PROVIDER_SITE_OTHER): Payer: Medicare Other | Admitting: Family Medicine

## 2020-06-24 ENCOUNTER — Encounter: Payer: Self-pay | Admitting: Family Medicine

## 2020-06-24 DIAGNOSIS — J069 Acute upper respiratory infection, unspecified: Secondary | ICD-10-CM

## 2020-06-24 NOTE — Progress Notes (Signed)
MyChart Video Visit    Virtual Visit via Video Note   This visit type was conducted due to national recommendations for restrictions regarding the COVID-19 Pandemic (e.g. social distancing) in an effort to limit this patient's exposure and mitigate transmission in our community. This patient is at least at moderate risk for complications without adequate follow up. This format is felt to be most appropriate for this patient at this time. Physical exam was limited by quality of the video and audio technology used for the visit.    Patient location: home Provider location: [place] involved in the visit: patient, provider  I discussed the limitations of evaluation and management by telemedicine and the availability of in person appointments. The patient expressed understanding and agreed to proceed.  Patient: Ashley Rodriguez   DOB: Aug 01, 1950   70 y.o. Female  MRN: [id number] Visit Date: 06/24/2020  Today's healthcare provider: Lavon Paganini, MD   Chief Complaint  Patient presents with  . Sore Throat   Subjective    HPI   Symptoms started 2/20 with diarrhea and vomiting Had been drinking small amount of alcohol the night before Symptoms include:  Sore throat, sinus congestion, rhinorrhea. GI symptoms Had COVID in 11/2018 Negative home COVID test x2 within last week Meds tried: amoxicillin that had previously been prescribed and she is feeling better, except for sore throat COVID Vaccine: x3  Social History   Tobacco Use  . Smoking status: Never Smoker  . Smokeless tobacco: Never Used  Vaping Use  . Vaping Use: Never used  Substance Use Topics  . Alcohol use: Yes    Alcohol/week: 4.0 standard drinks    Types: 4 Standard drinks or equivalent per week  . Drug use: No      Medications: Outpatient Medications Prior to Visit  Medication Sig  . acetaminophen (TYLENOL) 325 MG tablet Take 325 mg by mouth every 6 (six) hours as needed.  Marland Kitchen  amLODipine (NORVASC) 5 MG tablet TAKE 1 TABLET BY MOUTH ONCE A DAY AS NEEDED FOR BLOOD PRESSURE  . atenolol (TENORMIN) 50 MG tablet TAKE 1 TABLET BY MOUTH ONCE DAILY  . betamethasone dipropionate 0.05 % lotion Apply 1 application topically as needed.   . calcium carbonate (OS-CAL) 600 MG TABS tablet Take 600 mg by mouth daily with breakfast.  . co-enzyme Q-10 30 MG capsule Take 100 mg by mouth daily.  Scarlette Shorts SURECLICK 50 MG/ML injection Inject 50 mg into the skin once a week.   . esomeprazole (NEXIUM) 40 MG capsule Take 1 capsule (40 mg total) by mouth daily.  . fexofenadine (ALLEGRA) 180 MG tablet Take 180 mg by mouth daily.  . fluticasone (FLONASE) 50 MCG/ACT nasal spray PLACE 2 SPRAYS INTO EACH NOSTRIL ONCE DAILY  . gabapentin (NEURONTIN) 300 MG capsule Take 2 capsules AM and 3 capsules PM  . losartan (COZAAR) 25 MG tablet TAKE 1/2 TABLET (12.5 MG TOTAL) BY MOUTHONCE DAILY  . meloxicam (MOBIC) 15 MG tablet Take 15 mg by mouth daily.   . montelukast (SINGULAIR) 10 MG tablet TAKE 1 TABLET BY MOUTH AT BEDTIME  . Multiple Vitamins-Minerals (MULTIVITAMIN WITH MINERALS) tablet Take 1 tablet by mouth daily.  . Red Yeast Rice 600 MG CAPS Take by mouth daily.  . TURMERIC PO Take by mouth.  . valACYclovir (VALTREX) 1000 MG tablet TAKE 2 TABLETS BY MOUTH TWICE DAILY FOR 1 DAY WHEN FEVER BLISTER OCCURS   Facility-Administered Medications Prior to Visit  Medication Dose Route Frequency Provider  .  triamcinolone acetonide (KENALOG-40) injection 60 mg  60 mg Other [place], Max T, DPM    Review of Systems  Constitutional: Negative for appetite change, chills, fatigue and fever.  HENT: Positive for sore throat.   Respiratory: Negative for chest tightness and shortness of breath.   Cardiovascular: Negative for chest pain and palpitations.  Gastrointestinal: Negative for abdominal pain, nausea and vomiting.  Neurological: Negative for dizziness and weakness.      Objective    There were no  vitals taken for this visit.   Physical Exam Constitutional:      General: She is not in acute distress.    Appearance: She is well-developed.  HENT:     Head: Normocephalic.  Eyes:     Conjunctiva/sclera: Conjunctivae normal.  Pulmonary:     Effort: Pulmonary effort is normal. No respiratory distress.  Neurological:     Mental Status: She is alert and oriented to person, place, and time.  Psychiatric:        Behavior: Behavior normal.        Assessment & Plan     1. Viral URI - symptoms and exam c/w viral URI with GI symptoms  - no evidence of strep pharyngitis, CAP, AOM, bacterial sinusitis, or other bacterial infection - COVID test neg x2 - stop home abx - no indications for abx at this time - already improving - discussed symptomatic management, natural course, and return precautions   Return if symptoms worsen or fail to improve.     I discussed the assessment and treatment plan with the patient. The patient was provided an opportunity to ask questions and all were answered. The patient agreed with the plan and demonstrated an understanding of the instructions.   The patient was advised to call back or seek an in-person evaluation if the symptoms worsen or if the condition fails to improve as anticipated.  I, Lavon Paganini, MD, have reviewed all documentation for this visit. The documentation on 06/24/20 for the exam, diagnosis, procedures, and orders are all accurate and complete.   [place], Dionne Bucy, MD, MPH [facility]

## 2020-06-24 NOTE — Patient Instructions (Signed)

## 2020-06-27 ENCOUNTER — Other Ambulatory Visit: Payer: Self-pay | Admitting: Family Medicine

## 2020-06-27 NOTE — Telephone Encounter (Signed)
Requested Prescriptions  Pending Prescriptions Disp Refills  . esomeprazole (NEXIUM) 40 MG capsule [Pharmacy Med Name: ESOMEPRAZOLE MAGNESIUM 40 MG CAP] 90 capsule 3    Sig: TAKE 1 CAPSULE BY MOUTH ONCE DAILY     Gastroenterology: Proton Pump Inhibitors Passed - 06/27/2020 10:01 AM      Passed - Valid encounter within last 12 months    Recent Outpatient Visits          3 days ago Viral URI   [facility], Dionne Bucy, MD   5 months ago Type 2 diabetes mellitus with other specified complication, without long-term current use of insulin [facility])   [facility], Dionne Bucy, MD   11 months ago Type 2 diabetes mellitus with other specified complication, without long-term current use of insulin [facility])   [facility], Dionne Bucy, MD   12 months ago [facility] to [employer] preventive visit   [facility], Dionne Bucy, MD   1 year ago Chronic cough   [facility], Dionne Bucy, MD

## 2020-06-30 NOTE — Progress Notes (Signed)
Subjective:   Ashley Rodriguez is a 70 y.o. female who presents for an Initial Medicare Annual Wellness Visit.  I connected with Glendon Fiser today by telephone and verified that I am speaking with the correct person using two identifiers. Location patient: home Location provider: work Persons participating in the virtual visit: patient, provider.   I discussed the limitations, risks, security and privacy concerns of performing an evaluation and management service by telephone and the availability of in person appointments. I also discussed with the patient that there may be a patient responsible charge related to this service. The patient expressed understanding and verbally consented to this telephonic visit.    Interactive audio and video telecommunications were attempted between this provider and patient, however failed, due to patient having technical difficulties OR patient did not have access to video capability.  We continued and completed visit with audio only.   Review of Systems    N/A  Cardiac Risk Factors include: advanced age (>38men, >19 women);diabetes mellitus;hypertension     Objective:    There were no vitals filed for this visit. There is no height or weight on file to calculate BMI.  Advanced Directives 07/04/2020 03/17/2020 12/14/2019 01/22/2019 04/17/2018 04/18/2017 02/11/2017  Does Patient Have a Medical Advance Directive? No No No No No No No  Would patient like information on creating a medical advance directive? No - Patient declined - No - Patient declined - No - Patient declined No - Patient declined -    Current Medications (verified) Outpatient Encounter Medications as of 07/04/2020  Medication Sig  . acetaminophen (TYLENOL) 325 MG tablet Take 325 mg by mouth every 6 (six) hours as needed.  Marland Kitchen amLODipine (NORVASC) 5 MG tablet TAKE 1 TABLET BY MOUTH ONCE A DAY AS NEEDED FOR BLOOD PRESSURE  . atenolol (TENORMIN) 50 MG tablet TAKE 1 TABLET BY MOUTH ONCE DAILY   . betamethasone dipropionate 0.05 % lotion Apply 1 application topically as needed.   . calcium carbonate (OS-CAL) 600 MG TABS tablet Take 600 mg by mouth daily with breakfast.  . co-enzyme Q-10 30 MG capsule Take 100 mg by mouth daily.  Scarlette Shorts SURECLICK 50 MG/ML injection Inject 50 mg into the skin once a week.   . esomeprazole (NEXIUM) 40 MG capsule TAKE 1 CAPSULE BY MOUTH ONCE DAILY  . fexofenadine (ALLEGRA) 180 MG tablet Take 180 mg by mouth daily.  . fluticasone (FLONASE) 50 MCG/ACT nasal spray PLACE 2 SPRAYS INTO EACH NOSTRIL ONCE DAILY  . gabapentin (NEURONTIN) 300 MG capsule Take 2 capsules AM and 3 capsules PM  . losartan (COZAAR) 25 MG tablet TAKE 1/2 TABLET (12.5 MG TOTAL) BY MOUTHONCE DAILY  . meloxicam (MOBIC) 15 MG tablet Take 15 mg by mouth daily.   . montelukast (SINGULAIR) 10 MG tablet TAKE 1 TABLET BY MOUTH AT BEDTIME  . Multiple Vitamins-Minerals (MULTIVITAMIN WITH MINERALS) tablet Take 1 tablet by mouth daily.  . Red Yeast Rice 600 MG CAPS Take by mouth daily.  . TURMERIC PO Take by mouth daily at 6 (six) AM.  . valACYclovir (VALTREX) 1000 MG tablet TAKE 2 TABLETS BY MOUTH TWICE DAILY FOR 1 DAY WHEN FEVER BLISTER OCCURS   Facility-Administered Encounter Medications as of 07/04/2020  Medication  . triamcinolone acetonide (KENALOG-40) injection 60 mg    Allergies (verified) Atorvastatin, Codeine, and Sulfa antibiotics   History: Past Medical History:  Diagnosis Date  . COVID-19 12/15/2018  . Flank lipoma 08/17/2016  . Hypertension   . Neck mass 04/07/2013  .  Psoriatic arthritis ([place])   . Recurrent cellulitis   . T2DM (type 2 diabetes mellitus) ([place]) 10/01/2018   Past Surgical History:  Procedure Laterality Date  . ABDOMINAL HYSTERECTOMY  2000  . COLONOSCOPY  2015  . lipoma removed  04/2012   on neck  . NECK MASS EXCISION  04/28/14   lipoma   Family History  Problem Relation Age of Onset  . Hypertension Mother   . Cancer Father        bladder  . Breast  cancer Neg Hx   . Colon cancer Neg Hx    Social History   Socioeconomic History  . Marital status: Significant Other    Spouse name: Not on file  . Number of children: 1  . Years of education: Not on file  . Highest education level: Some college, no degree  Occupational History  . Occupation: Marine scientist    Comment: retired  Tobacco Use  . Smoking status: Never Smoker  . Smokeless tobacco: Never Used  Vaping Use  . Vaping Use: Never used  Substance and Sexual Activity  . Alcohol use: Yes    Alcohol/week: 0.0 - 4.0 standard drinks    Comment: no more han 2 drinks at a time  . Drug use: No  . Sexual activity: Not on file  Other Topics Concern  . Not on file  Social History Narrative  . Not on file   Social Determinants of Health   Financial Resource Strain: Low Risk   . Difficulty of Paying Living Expenses: Not hard at all  Food Insecurity: No Food Insecurity  . Worried About Charity fundraiser in the Last Year: Never true  . Ran Out of Food in the Last Year: Never true  Transportation Needs: No Transportation Needs  . Lack of Transportation (Medical): No  . Lack of Transportation (Non-Medical): No  Physical Activity: Insufficiently Active  . Days of Exercise per Week: 3 days  . Minutes of Exercise per Session: 40 min  Stress: No Stress Concern Present  . Feeling of Stress : Only a little  Social Connections: Moderately Integrated  . Frequency of Communication with Friends and Family: More than three times a week  . Frequency of Social Gatherings with Friends and Family: More than three times a week  . Attends Religious Services: More than 4 times per year  . Active Member of Clubs or Organizations: No  . Attends Archivist Meetings: Never  . Marital Status: Living with partner    Tobacco Counseling Counseling given: Not Answered   Clinical Intake:  Pre-visit preparation completed: Yes  Pain : No/denies pain     Nutritional  Risks: None Diabetes: Yes  How often do you need to have someone help you when you read instructions, pamphlets, or other written materials from your doctor or pharmacy?: 1 - Never  Diabetic? Yes  Nutrition Risk Assessment:  Has the patient had any N/V/D within the last 2 months?  No  Does the patient have any non-healing wounds?  No  Has the patient had any unintentional weight loss or weight gain?  No   Diabetes:  Is the patient diabetic?  Yes  If diabetic, was a CBG obtained today?  No  Did the patient bring in their glucometer from home?  No  How often do you monitor your CBG's? Does not check BS.   Financial Strains and Diabetes Management:  Are you having any financial strains with the device, your supplies  or your medication? No .  Does the patient want to be seen by Chronic Care Management for management of their diabetes?  No  Would the patient like to be referred to a Nutritionist or for Diabetic Management?  No   Diabetic Exams:  Diabetic Eye Exam: Completed 06/15/20 Diabetic Foot Exam: Completed 01/25/20   Interpreter Needed?: No  Information entered by :: [facility], LPN   Activities of Daily Living In your present state of health, do you have any difficulty performing the following activities: 07/04/2020 01/26/2020  Hearing? N Y  Vision? N N  Difficulty concentrating or making decisions? N Y  Walking or climbing stairs? N Y  Dressing or bathing? N N  Doing errands, shopping? N N  Preparing Food and eating ? N -  Using the Toilet? N -  In the past six months, have you accidently leaked urine? Y -  Comment Occasionally with urges. -  Do you have problems with loss of bowel control? N -  Managing your Medications? N -  Managing your Finances? N -  Housekeeping or managing your Housekeeping? N -  Some recent data might be hidden    Patient Care Team: Virginia Crews, MD as PCP - General (Family Medicine) Anell Barr, [place] (Optometry) Milinda Pointer, MD as Referring Physician (Pain Medicine)  Indicate any recent Medical Services you may have received from other than [facility] providers in the past year (date may be approximate).     Assessment:   This is a routine wellness examination for [place].  Hearing/Vision screen No exam data present  Dietary issues and exercise activities discussed: Current Exercise Habits: Home exercise routine;Structured exercise class, Type of exercise: walking;Other - see comments (water aerobics), Time (Minutes): 45, Frequency (Times/Week): 3 (to 4 days), Weekly Exercise (Minutes/Week): 135, Intensity: Mild, Exercise limited by: None identified  Goals    . DIET - REDUCE SUGAR INTAKE     Recommend to monitor and cut back on sugar intake in daily diet.       Depression Screen PHQ 2/9 Scores 07/04/2020 01/26/2020 12/14/2019 08/06/2019 07/02/2019 01/22/2019 10/01/2018  PHQ - 2 Score 0 0 0 0 0 0 0  PHQ- 9 Score - 4 - - 4 - 4    Fall Risk Fall Risk  07/04/2020 05/09/2020 03/31/2020 02/29/2020 01/26/2020  Falls in the past year? 0 0 0 0 0  Number falls in past yr: 0 - - - 0  Injury with Fall? 0 - - - 0  Risk for fall due to : - - - - No Fall Risks  Risk for fall due to: Comment - - - - -  Follow up - - - - Falls evaluation completed    FALL RISK PREVENTION PERTAINING TO THE HOME:  Any stairs in or around the home? Yes  If so, are there any without handrails? No  Home free of loose throw rugs in walkways, pet beds, electrical cords, etc? Yes  Adequate lighting in your home to reduce risk of falls? Yes   ASSISTIVE DEVICES UTILIZED TO PREVENT FALLS:  Life alert? No  Use of a cane, walker or w/c? No  Grab bars in the bathroom? No  Shower chair or bench in shower? Yes  Elevated toilet seat or a handicapped toilet? No    Cognitive Function: Normal cognitive status assessed by observation by this Nurse Health Advisor. No abnormalities found.       6CIT Screen 07/02/2019  What Year? 0 points  What  month?  0 points  What time? 0 points  Count back from 20 0 points  Months in reverse 0 points  Repeat phrase 0 points  Total Score 0    Immunizations Immunization History  Administered Date(s) Administered  . Fluad Quad(high Dose 65+) 01/01/2019, 01/25/2020  . PFIZER(Purple Top)SARS-COV-2 Vaccination 06/15/2019, 07/06/2019, 02/15/2020  . Pneumococcal Conjugate-13 09/20/2014  . Pneumococcal Polysaccharide-23 01/09/2012, 03/13/2018  . Td 09/08/2013  . Tdap 12/31/2005    TDAP status: Up to date  Flu Vaccine status: Up to date  Pneumococcal vaccine status: Up to date  Covid-19 vaccine status: Completed vaccines  Qualifies for Shingles Vaccine? Yes   Zostavax completed No   Shingrix Completed?: No.    Education has been provided regarding the importance of this vaccine. Patient has been advised to call insurance company to determine out of pocket expense if they have not yet received this vaccine. Advised may also receive vaccine at local pharmacy or Health Dept. Verbalized acceptance and understanding.  Screening Tests Health Maintenance  Topic Date Due  . HEMOGLOBIN A1C  07/24/2020  . FOOT EXAM  01/24/2021  . OPHTHALMOLOGY EXAM  06/15/2021  . MAMMOGRAM  03/30/2022  . TETANUS/TDAP  09/09/2023  . DEXA SCAN  11/11/2023  . COLONOSCOPY (Pts 45-79yrs Insurance coverage will need to be confirmed)  02/27/2024  . INFLUENZA VACCINE  Completed  . COVID-19 Vaccine  Completed  . Hepatitis C Screening  Completed  . PNA vac Low Risk Adult  Completed  . HPV VACCINES  Aged Out    Health Maintenance  There are no preventive care reminders to display for this patient.  Colorectal cancer screening: Type of screening: Colonoscopy. Completed 02/26/14. Repeat every 10 years  Mammogram status: Completed 03/30/20. Repeat every year  Bone Density status: Completed 11/11/18. Results reflect: Bone density results: OSTEOPENIA. Repeat every 5 years.  Lung Cancer Screening: (Low Dose CT Chest  recommended if Age 56-80 years, 30 pack-year currently smoking OR have quit w/in 15years.) does not qualify.   Additional Screening:  Hepatitis C Screening: Up to date  Vision Screening: Recommended annual ophthalmology exams for early detection of glaucoma and other disorders of the eye. Is the patient up to date with their annual eye exam?  Yes  Who is the provider or what is the name of the office in which the patient attends annual eye exams? Dr Ellin Mayhew  If pt is not established with a provider, would they like to be referred to a provider to establish care? No .   Dental Screening: Recommended annual dental exams for proper oral hygiene  Community Resource Referral / Chronic Care Management: CRR required this visit?  No   CCM required this visit?  No      Plan:     I have personally reviewed and noted the following in the patient's chart:   . Medical and social history . Use of alcohol, tobacco or illicit drugs  . Current medications and supplements . Functional ability and status . Nutritional status . Physical activity . Advanced directives . List of other physicians . Hospitalizations, surgeries, and ER visits in previous 12 months . Vitals . Screenings to include cognitive, depression, and falls . Referrals and appointments  In addition, I have reviewed and discussed with patient certain preventive protocols, quality metrics, and best practice recommendations. A written personalized care plan for preventive services as well as general preventive health recommendations were provided to patient.     McKenzie Gustine, [place]   09/29/3760  Nurse Notes: Requested previous eye exam notes to be faxed to clinic.

## 2020-07-01 NOTE — Patient Instructions (Addendum)
The CDC recommends two doses of Shingrix (the shingles vaccine) separated by 2 to 6 months for adults age 68 years and older. I recommend checking with your insurance plan regarding coverage for this vaccine.      Preventive Care 68 Years and Older, Female Preventive care refers to lifestyle choices and visits with your health care provider that can promote health and wellness. This includes:  A yearly physical exam. This is also called an annual wellness visit.  Regular dental and eye exams.  Immunizations.  Screening for certain conditions.  Healthy lifestyle choices, such as: ? Eating a healthy diet. ? Getting regular exercise. ? Not using drugs or products that contain nicotine and tobacco. ? Limiting alcohol use. What can I expect for my preventive care visit? Physical exam Your health care provider will check your:  Height and weight. These may be used to calculate your BMI (body mass index). BMI is a measurement that tells if you are at a healthy weight.  Heart rate and blood pressure.  Body temperature.  Skin for abnormal spots. Counseling Your health care provider may ask you questions about your:  Past medical problems.  Family's medical history.  Alcohol, tobacco, and drug use.  Emotional well-being.  Home life and relationship well-being.  Sexual activity.  Diet, exercise, and sleep habits.  History of falls.  Memory and ability to understand (cognition).  Work and work Statistician.  Pregnancy and menstrual history.  Access to firearms. What immunizations do I need? Vaccines are usually given at various ages, according to a schedule. Your health care provider will recommend vaccines for you based on your age, medical history, and lifestyle or other factors, such as travel or where you work.   What tests do I need? Blood tests  Lipid and cholesterol levels. These may be checked every 5 years, or more often depending on your overall  health.  Hepatitis C test.  Hepatitis B test. Screening  Lung cancer screening. You may have this screening every year starting at age 68 if you have a 30-pack-year history of smoking and currently smoke or have quit within the past 15 years.  Colorectal cancer screening. ? All adults should have this screening starting at age 68 and continuing until age 74. ? Your health care provider may recommend screening at age 68 if you are at increased risk. ? You will have tests every 1-10 years, depending on your results and the type of screening test.  Diabetes screening. ? This is done by checking your blood sugar (glucose) after you have not eaten for a while (fasting). ? You may have this done every 1-3 years.  Mammogram. ? This may be done every 1-2 years. ? Talk with your health care provider about how often you should have regular mammograms.  Abdominal aortic aneurysm (AAA) screening. You may need this if you are a current or former smoker.  BRCA-related cancer screening. This may be done if you have a family history of breast, ovarian, tubal, or peritoneal cancers. Other tests  STD (sexually transmitted disease) testing, if you are at risk.  Bone density scan. This is done to screen for osteoporosis. You may have this done starting at age 68. Talk with your health care provider about your test results, treatment options, and if necessary, the need for more tests. Follow these instructions at home: Eating and drinking  Eat a diet that includes fresh fruits and vegetables, whole grains, lean protein, and low-fat dairy products. Limit your  intake of foods with high amounts of sugar, saturated fats, and salt.  Take vitamin and mineral supplements as recommended by your health care provider.  Do not drink alcohol if your health care provider tells you not to drink.  If you drink alcohol: ? Limit how much you have to 0-1 drink a day. ? Be aware of how much alcohol is in your  drink. In the [place] one drink equals one 12 oz bottle of beer (355 mL), one 5 oz glass of wine (148 mL), or one 1 oz glass of hard liquor (44 mL).   Lifestyle  Take daily care of your teeth and gums. Brush your teeth every morning and night with fluoride toothpaste. Floss one time each day.  Stay active. Exercise for at least 30 minutes 5 or more days each week.  Do not use any products that contain nicotine or tobacco, such as cigarettes, e-cigarettes, and chewing tobacco. If you need help quitting, ask your health care provider.  Do not use drugs.  If you are sexually active, practice safe sex. Use a condom or other form of protection in order to prevent STIs (sexually transmitted infections).  Talk with your health care provider about taking a low-dose aspirin or statin.  Find healthy ways to cope with stress, such as: ? Meditation, yoga, or listening to music. ? Journaling. ? Talking to a trusted person. ? Spending time with friends and family. Safety  Always wear your seat belt while driving or riding in a vehicle.  Do not drive: ? If you have been drinking alcohol. Do not ride with someone who has been drinking. ? When you are tired or distracted. ? While texting.  Wear a helmet and other protective equipment during sports activities.  If you have firearms in your house, make sure you follow all gun safety procedures. What's next?  Visit your health care provider once a year for an annual wellness visit.  Ask your health care provider how often you should have your eyes and teeth checked.  Stay up to date on all vaccines. This information is not intended to replace advice given to you by your health care provider. Make sure you discuss any questions you have with your health care provider. Document Revised: 04/06/2020 Document Reviewed: 04/10/2018 Elsevier Patient Education  2021 Elsevier Inc.  

## 2020-07-01 NOTE — Progress Notes (Signed)
Complete physical exam   Patient: Ashley Rodriguez   DOB: 05-21-1950   70 y.o. Female  MRN: [id number] Visit Date: 07/04/2020  Today's healthcare provider: Lavon Paganini, MD   Chief Complaint  Patient presents with  . Annual Exam   Subjective    Ashley Rodriguez is a 70 y.o. female who presents today for a complete physical exam.  She reports consuming a general diet. Exercies Regularly She generally feels well. She reports sleeping fairly well. She does have additional problems to discuss today.  HPI   +Sore throat. Had URI 1-2 weeks ago. Symptoms are easing up. Worse at night. Takes antihistamine anf flonase qhs. +postnasal drip with ball of phlegm in the mornings.   Past Medical History:  Diagnosis Date  . COVID-19 12/15/2018  . Flank lipoma 08/17/2016  . Hypertension   . Neck mass 04/07/2013  . Psoriatic arthritis ([place])   . Recurrent cellulitis   . T2DM (type 2 diabetes mellitus) ([place]) 10/01/2018   Past Surgical History:  Procedure Laterality Date  . ABDOMINAL HYSTERECTOMY  2000  . COLONOSCOPY  2015  . lipoma removed  04/2012   on neck  . NECK MASS EXCISION  04/28/14   lipoma   Social History   Socioeconomic History  . Marital status: Significant Other    Spouse name: Not on file  . Number of children: 1  . Years of education: Not on file  . Highest education level: Some college, no degree  Occupational History  . Occupation: Marine scientist    Comment: retired  Tobacco Use  . Smoking status: Never Smoker  . Smokeless tobacco: Never Used  Vaping Use  . Vaping Use: Never used  Substance and Sexual Activity  . Alcohol use: Yes    Alcohol/week: 0.0 - 4.0 standard drinks    Comment: no more han 2 drinks at a time  . Drug use: No  . Sexual activity: Not on file  Other Topics Concern  . Not on file  Social History Narrative  . Not on file   Social Determinants of Health   Financial Resource Strain: Low Risk   . Difficulty of Paying Living  Expenses: Not hard at all  Food Insecurity: No Food Insecurity  . Worried About Charity fundraiser in the Last Year: Never true  . Ran Out of Food in the Last Year: Never true  Transportation Needs: No Transportation Needs  . Lack of Transportation (Medical): No  . Lack of Transportation (Non-Medical): No  Physical Activity: Insufficiently Active  . Days of Exercise per Week: 3 days  . Minutes of Exercise per Session: 40 min  Stress: No Stress Concern Present  . Feeling of Stress : Only a little  Social Connections: Moderately Integrated  . Frequency of Communication with Friends and Family: More than three times a week  . Frequency of Social Gatherings with Friends and Family: More than three times a week  . Attends Religious Services: More than 4 times per year  . Active Member of Clubs or Organizations: No  . Attends Archivist Meetings: Never  . Marital Status: Living with partner  Intimate Partner Violence: Not At Risk  . Fear of Current or Ex-Partner: No  . Emotionally Abused: No  . Physically Abused: No  . Sexually Abused: No   Family Status  Relation Name Status  . Mother  Alive  . Father  Deceased  . Sister  Alive  . Neg Hx  (  Not Specified)   Family History  Problem Relation Age of Onset  . Hypertension Mother   . Cancer Father        bladder  . Breast cancer Neg Hx   . Colon cancer Neg Hx    Allergies  Allergen Reactions  . Atorvastatin Other (See Comments)    Myalgias  . Codeine Rash  . Sulfa Antibiotics Rash    Patient Care Team: Virginia Crews, MD as PCP - General (Family Medicine) Anell Barr, [place] (Optometry) Milinda Pointer, MD as Referring Physician (Pain Medicine)   Medications: Outpatient Medications Prior to Visit  Medication Sig  . acetaminophen (TYLENOL) 325 MG tablet Take 325 mg by mouth every 6 (six) hours as needed.  Marland Kitchen amLODipine (NORVASC) 5 MG tablet TAKE 1 TABLET BY MOUTH ONCE A DAY AS NEEDED FOR BLOOD PRESSURE   . atenolol (TENORMIN) 50 MG tablet TAKE 1 TABLET BY MOUTH ONCE DAILY  . betamethasone dipropionate 0.05 % lotion Apply 1 application topically as needed.   . calcium carbonate (OS-CAL) 600 MG TABS tablet Take 600 mg by mouth daily with breakfast.  . co-enzyme Q-10 30 MG capsule Take 100 mg by mouth daily.  Scarlette Shorts SURECLICK 50 MG/ML injection Inject 50 mg into the skin once a week.   . esomeprazole (NEXIUM) 40 MG capsule TAKE 1 CAPSULE BY MOUTH ONCE DAILY  . fexofenadine (ALLEGRA) 180 MG tablet Take 180 mg by mouth daily.  . fluticasone (FLONASE) 50 MCG/ACT nasal spray PLACE 2 SPRAYS INTO EACH NOSTRIL ONCE DAILY  . gabapentin (NEURONTIN) 300 MG capsule Take 2 capsules AM and 3 capsules PM  . losartan (COZAAR) 25 MG tablet TAKE 1/2 TABLET (12.5 MG TOTAL) BY MOUTHONCE DAILY  . meloxicam (MOBIC) 15 MG tablet Take 15 mg by mouth daily.   . montelukast (SINGULAIR) 10 MG tablet TAKE 1 TABLET BY MOUTH AT BEDTIME  . Multiple Vitamins-Minerals (MULTIVITAMIN WITH MINERALS) tablet Take 1 tablet by mouth daily.  . Red Yeast Rice 600 MG CAPS Take by mouth daily.  . TURMERIC PO Take by mouth daily at 6 (six) AM.  . valACYclovir (VALTREX) 1000 MG tablet TAKE 2 TABLETS BY MOUTH TWICE DAILY FOR 1 DAY WHEN FEVER BLISTER OCCURS   Facility-Administered Medications Prior to Visit  Medication Dose Route Frequency Provider  . triamcinolone acetonide (KENALOG-40) injection 60 mg  60 mg Other Once [place], Max T, DPM    Review of Systems  Constitutional: Negative.   HENT: Negative.   Eyes: Negative.   Respiratory: Negative.   Cardiovascular: Negative.   Gastrointestinal: Negative.   Endocrine: Negative.   Genitourinary: Negative.   Musculoskeletal: Positive for arthralgias and back pain. Negative for gait problem, joint swelling, myalgias, neck pain and neck stiffness.  Skin: Negative.   Allergic/Immunologic: Negative.   Neurological: Negative.   Hematological: Negative.   Psychiatric/Behavioral:  Positive for sleep disturbance. Negative for agitation, behavioral problems, confusion, decreased concentration, dysphoric mood, hallucinations, self-injury and suicidal ideas. The patient is not nervous/anxious and is not hyperactive.     Last CBC Lab Results  Component Value Date   WBC 4.6 01/25/2020   HGB 14.3 01/25/2020   HCT 42.2 01/25/2020   MCV 96 01/25/2020   MCH 32.6 01/25/2020   RDW 13.1 01/25/2020   PLT 355 79/89/2119   Last metabolic panel Lab Results  Component Value Date   GLUCOSE 105 (H) 01/25/2020   NA 140 01/25/2020   K 4.4 01/25/2020   CL 100 01/25/2020   CO2  25 01/25/2020   BUN 16 01/25/2020   CREATININE 0.64 01/25/2020   GFRNONAA 93 01/25/2020   GFRAA 107 01/25/2020   CALCIUM 10.1 01/25/2020   PROT 7.0 01/25/2020   ALBUMIN 4.9 (H) 01/25/2020   LABGLOB 2.1 01/25/2020   AGRATIO 2.3 (H) 01/25/2020   BILITOT 0.5 01/25/2020   ALKPHOS 74 01/25/2020   AST 24 01/25/2020   ALT 24 01/25/2020   Last lipids Lab Results  Component Value Date   CHOL 180 01/25/2020   HDL 54 01/25/2020   LDLCALC 99 01/25/2020   TRIG 157 (H) 01/25/2020   CHOLHDL 3.3 01/25/2020   Last hemoglobin A1c Lab Results  Component Value Date   HGBA1C 6.2 (A) 01/25/2020   Last thyroid functions Lab Results  Component Value Date   TSH 3.55 02/26/2017   Last vitamin D Lab Results  Component Value Date   25OHVITD2 <1.0 01/03/2017   25OHVITD3 50 01/03/2017   Last vitamin B12 and Folate Lab Results  Component Value Date   VITAMINB12 740 01/03/2017      Objective    BP (!) 163/83 (BP Location: Left Arm, Patient Position: Sitting, Cuff Size: Normal)   Pulse 60   Temp 97.8 F (36.6 C) (Oral)   Ht 5' 1.5" (1.562 m)   Wt 142 lb (64.4 kg)   SpO2 99%   BMI 26.40 kg/m  BP Readings from Last 3 Encounters:  07/04/20 (!) 163/83  05/09/20 (!) 156/74  03/31/20 (!) 130/58   Wt Readings from Last 3 Encounters:  07/04/20 142 lb (64.4 kg)  05/09/20 142 lb (64.4 kg)  03/31/20  139 lb (63 kg)      Physical Exam Vitals reviewed.  Constitutional:      General: She is not in acute distress.    Appearance: Normal appearance. She is well-developed. She is not diaphoretic.  HENT:     Head: Normocephalic and atraumatic.     Right Ear: Tympanic membrane, ear canal and external ear normal.     Left Ear: Tympanic membrane, ear canal and external ear normal.     Nose: Nose normal.     Mouth/Throat:     Mouth: Mucous membranes are moist.     Pharynx: Oropharynx is clear. No oropharyngeal exudate.  Eyes:     General: No scleral icterus.    Conjunctiva/sclera: Conjunctivae normal.     Pupils: Pupils are equal, round, and reactive to light.  Neck:     Thyroid: No thyromegaly.  Cardiovascular:     Rate and Rhythm: Normal rate and regular rhythm.     Pulses: Normal pulses.     Heart sounds: Normal heart sounds. No murmur heard.   Pulmonary:     Effort: Pulmonary effort is normal. No respiratory distress.     Breath sounds: Normal breath sounds. No wheezing or rales.  Abdominal:     General: There is no distension.     Palpations: Abdomen is soft.     Tenderness: There is no abdominal tenderness.  Musculoskeletal:        General: No deformity.     Cervical back: Neck supple.     Right lower leg: No edema.     Left lower leg: No edema.  Lymphadenopathy:     Cervical: No cervical adenopathy.  Skin:    General: Skin is warm and dry.     Findings: No rash.  Neurological:     Mental Status: She is alert and oriented to person, place, and time. Mental status  is at baseline.     Sensory: No sensory deficit.     Motor: No weakness.     Gait: Gait normal.  Psychiatric:        Mood and Affect: Mood normal.        Behavior: Behavior normal.        Thought Content: Thought content normal.       Last depression screening scores PHQ 2/9 Scores 07/04/2020 01/26/2020 12/14/2019  PHQ - 2 Score 0 0 0  PHQ- 9 Score - 4 -   Last fall risk screening Fall Risk   07/04/2020  Falls in the past year? 0  Number falls in past yr: 0  Injury with Fall? 0  Risk for fall due to : -  Risk for fall due to: Comment -  Follow up -   Last Audit-C alcohol use screening Alcohol Use Disorder Test (AUDIT) 07/04/2020  1. How often do you have a drink containing alcohol? 2  2. How many drinks containing alcohol do you have on a typical day when you are drinking? 0  3. How often do you have six or more drinks on one occasion? 0  AUDIT-C Score 2  4. How often during the last year have you found that you were not able to stop drinking once you had started? -  5. How often during the last year have you failed to do what was normally expected from you because of drinking? -  6. How often during the last year have you needed a first drink in the morning to get yourself going after a heavy drinking session? -  7. How often during the last year have you had a feeling of guilt of remorse after drinking? -  8. How often during the last year have you been unable to remember what happened the night before because you had been drinking? -  9. Have you or someone else been injured as a result of your drinking? -  10. Has a relative or friend or a doctor or another health worker been concerned about your drinking or suggested you cut down? -  Alcohol Use Disorder Identification Test Final Score (AUDIT) -  Alcohol Brief Interventions/Follow-up AUDIT Score <7 follow-up not indicated   A score of 3 or more in women, and 4 or more in men indicates increased risk for alcohol abuse, EXCEPT if all of the points are from question 1   No results found for any visits on 07/04/20.  Assessment & Plan    Routine Health Maintenance and Physical Exam  Exercise Activities and Dietary recommendations Goals    . DIET - REDUCE SUGAR INTAKE     Recommend to monitor and cut back on sugar intake in daily diet.        Immunization History  Administered Date(s) Administered  . Fluad Quad(high  Dose 65+) 01/01/2019, 01/25/2020  . PFIZER(Purple Top)SARS-COV-2 Vaccination 06/15/2019, 07/06/2019, 02/15/2020  . Pneumococcal Conjugate-13 09/20/2014  . Pneumococcal Polysaccharide-23 01/09/2012, 03/13/2018  . Td 09/08/2013  . Tdap 12/31/2005    Health Maintenance  Topic Date Due  . HEMOGLOBIN A1C  07/24/2020  . FOOT EXAM  01/24/2021  . OPHTHALMOLOGY EXAM  06/15/2021  . MAMMOGRAM  03/30/2022  . TETANUS/TDAP  09/09/2023  . DEXA SCAN  11/11/2023  . COLONOSCOPY (Pts 45-65yrs Insurance coverage will need to be confirmed)  02/27/2024  . INFLUENZA VACCINE  Completed  . COVID-19 Vaccine  Completed  . Hepatitis C Screening  Completed  . PNA  vac Low Risk Adult  Completed  . HPV VACCINES  Aged Out    Discussed health benefits of physical activity, and encouraged her to engage in regular exercise appropriate for her age and condition.  Problem List Items Addressed This Visit      Cardiovascular and Mediastinum   Hypertension associated with diabetes ([place])    Elevated today, but does have white coat hypertension Continue to monitor home BPs Continue current meds ROI for last CMP (Rheum)        Respiratory   Allergic rhinitis with postnasal drip - Primary    Chronic and uncontrolled Continue singulair, allegra, flonase Add astelin nasal spray        Endocrine   T2DM (type 2 diabetes mellitus) ([place])    Well controlled on last A1c Repeat A1c Continue current medications UTD on vaccines, eye exam, foot exam On ARB Discussed diet and exercise F/u in 6 months       Relevant Orders   Hemoglobin A1c   Hyperlipidemia associated with type 2 diabetes mellitus ([place])    Myalgias due to statins Continue CoQ10 Recheck CMP and FLP      Relevant Orders   Lipid Panel With LDL/HDL Ratio     Musculoskeletal and Integument   Psoriatic arthritis ([facility]) (Chronic)    Followed by Rheum - will send ROI for last labs        Other   Myalgia due to statin    Other Visit Diagnoses     Encounter for annual physical exam       Relevant Orders   Hemoglobin A1c   Lipid Panel With LDL/HDL Ratio       Return in about 6 months (around 01/04/2021) for chronic disease f/u.     I, Lavon Paganini, MD, have reviewed all documentation for this visit. The documentation on 07/04/20 for the exam, diagnosis, procedures, and orders are all accurate and complete.   , Dionne Bucy, MD, MPH [facility]

## 2020-07-04 ENCOUNTER — Encounter: Payer: Self-pay | Admitting: Family Medicine

## 2020-07-04 ENCOUNTER — Ambulatory Visit (INDEPENDENT_AMBULATORY_CARE_PROVIDER_SITE_OTHER): Payer: Medicare Other | Admitting: Family Medicine

## 2020-07-04 ENCOUNTER — Ambulatory Visit (INDEPENDENT_AMBULATORY_CARE_PROVIDER_SITE_OTHER): Payer: Medicare Other

## 2020-07-04 ENCOUNTER — Other Ambulatory Visit: Payer: Self-pay

## 2020-07-04 VITALS — BP 163/83 | HR 60 | Temp 97.8°F | Ht 61.5 in | Wt 142.0 lb

## 2020-07-04 DIAGNOSIS — Z79899 Other long term (current) drug therapy: Secondary | ICD-10-CM

## 2020-07-04 DIAGNOSIS — M791 Myalgia, unspecified site: Secondary | ICD-10-CM

## 2020-07-04 DIAGNOSIS — L4050 Arthropathic psoriasis, unspecified: Secondary | ICD-10-CM | POA: Diagnosis not present

## 2020-07-04 DIAGNOSIS — I152 Hypertension secondary to endocrine disorders: Secondary | ICD-10-CM

## 2020-07-04 DIAGNOSIS — E785 Hyperlipidemia, unspecified: Secondary | ICD-10-CM | POA: Diagnosis not present

## 2020-07-04 DIAGNOSIS — J309 Allergic rhinitis, unspecified: Secondary | ICD-10-CM | POA: Diagnosis not present

## 2020-07-04 DIAGNOSIS — E1169 Type 2 diabetes mellitus with other specified complication: Secondary | ICD-10-CM

## 2020-07-04 DIAGNOSIS — E1159 Type 2 diabetes mellitus with other circulatory complications: Secondary | ICD-10-CM | POA: Diagnosis not present

## 2020-07-04 DIAGNOSIS — Z0000 Encounter for general adult medical examination without abnormal findings: Secondary | ICD-10-CM

## 2020-07-04 DIAGNOSIS — R0982 Postnasal drip: Secondary | ICD-10-CM | POA: Diagnosis not present

## 2020-07-04 DIAGNOSIS — T466X5A Adverse effect of antihyperlipidemic and antiarteriosclerotic drugs, initial encounter: Secondary | ICD-10-CM | POA: Diagnosis not present

## 2020-07-04 DIAGNOSIS — Z1231 Encounter for screening mammogram for malignant neoplasm of breast: Secondary | ICD-10-CM

## 2020-07-04 MED ORDER — AZELASTINE HCL 0.1 % NA SOLN: 2.0000 | Freq: Two times a day (BID) | NASAL | 12 refills | Status: DC

## 2020-07-04 NOTE — Assessment & Plan Note (Signed)
Well controlled on last A1c Repeat A1c Continue current medications UTD on vaccines, eye exam, foot exam On ARB Discussed diet and exercise F/u in 6 months

## 2020-07-04 NOTE — Assessment & Plan Note (Signed)
Followed by Rheum - will send ROI for last labs

## 2020-07-04 NOTE — Assessment & Plan Note (Signed)
Chronic and uncontrolled Continue singulair, allegra, flonase Add astelin nasal spray

## 2020-07-04 NOTE — Assessment & Plan Note (Signed)
Myalgias due to statins Continue CoQ10 Recheck CMP and FLP

## 2020-07-04 NOTE — Assessment & Plan Note (Signed)
Elevated today, but does have white coat hypertension Continue to monitor home BPs Continue current meds ROI for last CMP (Rheum)

## 2020-07-05 ENCOUNTER — Other Ambulatory Visit: Payer: Medicare Other | Admitting: Orthotics

## 2020-07-05 LAB — HEMOGLOBIN A1C
Est. average glucose Bld gHb Est-mCnc: 126 mg/dL
Hgb A1c MFr Bld: 6 % — ABNORMAL HIGH (ref 4.8–5.6)

## 2020-07-05 LAB — LIPID PANEL WITH LDL/HDL RATIO
Cholesterol, Total: 219 mg/dL — ABNORMAL HIGH (ref 100–199)
HDL: 48 mg/dL (ref >39)
LDL Chol Calc (NIH): 145 mg/dL — ABNORMAL HIGH (ref 0–99)
LDL/HDL Ratio: 3 ratio (ref 0.0–3.2)
Triglycerides: 145 mg/dL (ref 0–149)
VLDL Cholesterol Cal: 26 mg/dL (ref 5–40)

## 2020-07-06 ENCOUNTER — Telehealth: Payer: Self-pay

## 2020-07-06 NOTE — Telephone Encounter (Signed)
I called Ashley Rodriguez and Ashley Rodriguez verbalized understanding of information below. Ashley Rodriguez wants to know if red yeast rice usage after 3 mo is enough time to gauge efficacy of cholesterol reduction and if not, she wants to know if there is an alternative to the statin?  Ashley Rodriguez also asked if there is a cheaper generic brand of azelastine (astelin) nose spray?

## 2020-07-06 NOTE — Telephone Encounter (Signed)
-----   Message from Virginia Crews, MD sent at 07/05/2020 10:06 AM EST ----- A1c remains well controlled at 6.  Cholesterol has increased significantly since it was last tested. I know that she has gotten myalgias from statins in the past, but could consider a low dose an only weekly dosing vs Zetia to lower heart disease risk.

## 2020-07-07 ENCOUNTER — Other Ambulatory Visit: Payer: Self-pay

## 2020-07-07 ENCOUNTER — Ambulatory Visit (INDEPENDENT_AMBULATORY_CARE_PROVIDER_SITE_OTHER): Payer: Medicare Other | Admitting: Podiatry

## 2020-07-07 DIAGNOSIS — M778 Other enthesopathies, not elsewhere classified: Secondary | ICD-10-CM

## 2020-07-07 DIAGNOSIS — M7741 Metatarsalgia, right foot: Secondary | ICD-10-CM

## 2020-07-07 DIAGNOSIS — G5791 Unspecified mononeuropathy of right lower limb: Secondary | ICD-10-CM

## 2020-07-07 NOTE — Progress Notes (Signed)
Patient presents today to get molded for orthotics. Patient asked if the insurance which patient has [employer]. I stated to the patient the the insurance does not cover the orthotics and patient declined to get the orthotics.  I did get her to try over the counter power steps.  I stated to the patient once you try the power steps for about a 1 month or so and if you feel that the custom orthotics would help and we could get patient back to the office for molding.

## 2020-07-08 ENCOUNTER — Telehealth: Payer: Self-pay | Admitting: *Deleted

## 2020-07-08 MED ORDER — AZELASTINE HCL 0.1 % NA SOLN
2.0000 | Freq: Two times a day (BID) | NASAL | 12 refills | Status: DC
Start: 2020-07-08 — End: 2021-04-14

## 2020-07-08 MED ORDER — EZETIMIBE 10 MG PO TABS: 10.0000 mg | ORAL_TABLET | Freq: Every day | ORAL | 3 refills | Status: DC

## 2020-07-08 NOTE — Telephone Encounter (Signed)
-----   Message from Virginia Crews, MD sent at 07/07/2020  8:08 AM EST ----- 71m is long enough on red yeast rice. We also know that even though this lowers cholesterol, it is not proven to lower heart disease/stroke risk.  Zetia is an alternative to statins.  The generic of azelastine was what was sent in.

## 2020-07-08 NOTE — Telephone Encounter (Signed)
Patient would like to pick up today a hard script for azelastine (ASTELIN) 0.1 % nasal spray. Please call patient when placed at front desk [phone].

## 2020-07-08 NOTE — Telephone Encounter (Signed)
Copied from [place] [id number]. Topic: General - Inquiry >> Jul 08, 2020 11:49 AM Oneta Rack wrote: Patient received a call from a nurse regarding her most recent lab results and the caller stated they wanted her to try a new cholesterol medication but patient has not received another call, patient would like to discuss further

## 2020-07-22 ENCOUNTER — Other Ambulatory Visit: Payer: Self-pay | Admitting: Family Medicine

## 2020-07-22 NOTE — Telephone Encounter (Signed)
   Notes to clinic: script is expired Review for refill    Requested Prescriptions  Pending Prescriptions Disp Refills   valACYclovir (VALTREX) 1000 MG tablet [Pharmacy Med Name: VALACYCLOVIR HCL 1 GM TAB] 30 tablet 0    Sig: TAKE 2 TABLETS BY MOUTH TWICE DAILY FOR 1 DAY WHEN FEVER BLISTER OCCURS      Antimicrobials:  Antiviral Agents - Anti-Herpetic Passed - 07/22/2020  9:17 AM      Passed - Valid encounter within last 12 months    Recent Outpatient Visits           2 weeks ago Allergic rhinitis with postnasal drip   [facility] [place], Dionne Bucy, MD   4 weeks ago Viral URI   [facility] [place], Dionne Bucy, MD   5 months ago Type 2 diabetes mellitus with other specified complication, without long-term current use of insulin [facility])   [facility], Dionne Bucy, MD   11 months ago Type 2 diabetes mellitus with other specified complication, without long-term current use of insulin [facility])   [facility] [place], Dionne Bucy, MD   1 year ago Welcome to [employer] preventive visit   [facility] [place], Dionne Bucy, MD       Future Appointments             In 5 months [place], Dionne Bucy, MD [facility] [place]

## 2020-07-30 ENCOUNTER — Other Ambulatory Visit: Payer: Self-pay | Admitting: Family Medicine

## 2020-07-30 NOTE — Telephone Encounter (Signed)
Requested Prescriptions  Pending Prescriptions Disp Refills  . losartan (COZAAR) 25 MG tablet [Pharmacy Med Name: LOSARTAN POTASSIUM 25 MG TAB] 45 tablet 0    Sig: TAKE 1/2 TABLET (12.5 MG TOTAL) BY MOUTHONCE DAILY     Cardiovascular:  Angiotensin Receptor Blockers Failed - 07/30/2020 11:19 AM      Failed - Cr in normal range and within 180 days    Creatinine  Date Value Ref Range Status  04/16/2014 0.68 0.60 - 1.30 mg/dL Final   Creatinine, Ser  Date Value Ref Range Status  01/25/2020 0.64 0.57 - 1.00 mg/dL Final   Creatinine, POC  Date Value Ref Range Status  01/01/2019 NA\ mg/dL Final         Failed - K in normal range and within 180 days    Potassium  Date Value Ref Range Status  01/25/2020 4.4 3.5 - 5.2 mmol/L Final         Failed - Last BP in normal range    BP Readings from Last 1 Encounters:  07/04/20 (!) 163/83         Passed - Patient is not pregnant      Passed - Valid encounter within last 6 months    Recent Outpatient Visits          3 weeks ago Allergic rhinitis with postnasal drip   [facility] [place], Dionne Bucy, MD   1 month ago Viral URI   [facility] [place], Dionne Bucy, MD   6 months ago Type 2 diabetes mellitus with other specified complication, without long-term current use of insulin ([place])   [facility], Dionne Bucy, MD   1 year ago Type 2 diabetes mellitus with other specified complication, without long-term current use of insulin [facility])   [facility], Dionne Bucy, MD   1 year ago Welcome to [employer] preventive visit   [facility], Dionne Bucy, MD      Future Appointments            In 5 months Bacigalupo, Dionne Bucy, MD [facility], [facility]           . atenolol (TENORMIN) 50 MG tablet [Pharmacy Med Name: ATENOLOL 50 MG TAB] 90 tablet 1    Sig: TAKE 1 TABLET BY MOUTH ONCE DAILY     Cardiovascular:  Beta Blockers Failed -  07/30/2020 11:19 AM      Failed - Last BP in normal range    BP Readings from Last 1 Encounters:  07/04/20 (!) 163/83         Passed - Last Heart Rate in normal range    Pulse Readings from Last 1 Encounters:  07/04/20 60         Passed - Valid encounter within last 6 months    Recent Outpatient Visits          3 weeks ago Allergic rhinitis with postnasal drip   [facility] [place], Dionne Bucy, MD   1 month ago Viral URI   [facility], Dionne Bucy, MD   6 months ago Type 2 diabetes mellitus with other specified complication, without long-term current use of insulin [facility])   [place], Dionne Bucy, MD   1 year ago Type 2 diabetes mellitus with other specified complication, without long-term current use of insulin [facility])   [facility] [place], Dionne Bucy, MD   1 year ago [facility] to [facility] preventive visit  [facility] Bacigalupo, Dionne Bucy, MD      Future Appointments            In 5 months Bacigalupo, Dionne Bucy, MD [facility] [place]

## 2020-08-01 DIAGNOSIS — M9901 Segmental and somatic dysfunction of cervical region: Secondary | ICD-10-CM | POA: Diagnosis not present

## 2020-08-01 DIAGNOSIS — M542 Cervicalgia: Secondary | ICD-10-CM | POA: Diagnosis not present

## 2020-08-01 DIAGNOSIS — M9903 Segmental and somatic dysfunction of lumbar region: Secondary | ICD-10-CM | POA: Diagnosis not present

## 2020-08-01 DIAGNOSIS — M5416 Radiculopathy, lumbar region: Secondary | ICD-10-CM | POA: Diagnosis not present

## 2020-08-02 DIAGNOSIS — M9901 Segmental and somatic dysfunction of cervical region: Secondary | ICD-10-CM | POA: Diagnosis not present

## 2020-08-02 DIAGNOSIS — M9903 Segmental and somatic dysfunction of lumbar region: Secondary | ICD-10-CM | POA: Diagnosis not present

## 2020-08-02 DIAGNOSIS — M5416 Radiculopathy, lumbar region: Secondary | ICD-10-CM | POA: Diagnosis not present

## 2020-08-02 DIAGNOSIS — M542 Cervicalgia: Secondary | ICD-10-CM | POA: Diagnosis not present

## 2020-08-04 DIAGNOSIS — M9903 Segmental and somatic dysfunction of lumbar region: Secondary | ICD-10-CM | POA: Diagnosis not present

## 2020-08-04 DIAGNOSIS — M5416 Radiculopathy, lumbar region: Secondary | ICD-10-CM | POA: Diagnosis not present

## 2020-08-04 DIAGNOSIS — M542 Cervicalgia: Secondary | ICD-10-CM | POA: Diagnosis not present

## 2020-08-04 DIAGNOSIS — M9901 Segmental and somatic dysfunction of cervical region: Secondary | ICD-10-CM | POA: Diagnosis not present

## 2020-08-08 DIAGNOSIS — M9901 Segmental and somatic dysfunction of cervical region: Secondary | ICD-10-CM | POA: Diagnosis not present

## 2020-08-08 DIAGNOSIS — M9903 Segmental and somatic dysfunction of lumbar region: Secondary | ICD-10-CM | POA: Diagnosis not present

## 2020-08-08 DIAGNOSIS — M5416 Radiculopathy, lumbar region: Secondary | ICD-10-CM | POA: Diagnosis not present

## 2020-08-08 DIAGNOSIS — M542 Cervicalgia: Secondary | ICD-10-CM | POA: Diagnosis not present

## 2020-08-09 DIAGNOSIS — M9903 Segmental and somatic dysfunction of lumbar region: Secondary | ICD-10-CM | POA: Diagnosis not present

## 2020-08-09 DIAGNOSIS — M542 Cervicalgia: Secondary | ICD-10-CM | POA: Diagnosis not present

## 2020-08-09 DIAGNOSIS — M9901 Segmental and somatic dysfunction of cervical region: Secondary | ICD-10-CM | POA: Diagnosis not present

## 2020-08-09 DIAGNOSIS — M5416 Radiculopathy, lumbar region: Secondary | ICD-10-CM | POA: Diagnosis not present

## 2020-08-10 DIAGNOSIS — M9901 Segmental and somatic dysfunction of cervical region: Secondary | ICD-10-CM | POA: Diagnosis not present

## 2020-08-10 DIAGNOSIS — M9903 Segmental and somatic dysfunction of lumbar region: Secondary | ICD-10-CM | POA: Diagnosis not present

## 2020-08-10 DIAGNOSIS — M542 Cervicalgia: Secondary | ICD-10-CM | POA: Diagnosis not present

## 2020-08-10 DIAGNOSIS — M5416 Radiculopathy, lumbar region: Secondary | ICD-10-CM | POA: Diagnosis not present

## 2020-08-15 DIAGNOSIS — M542 Cervicalgia: Secondary | ICD-10-CM | POA: Diagnosis not present

## 2020-08-15 DIAGNOSIS — M5416 Radiculopathy, lumbar region: Secondary | ICD-10-CM | POA: Diagnosis not present

## 2020-08-15 DIAGNOSIS — M9901 Segmental and somatic dysfunction of cervical region: Secondary | ICD-10-CM | POA: Diagnosis not present

## 2020-08-15 DIAGNOSIS — M9903 Segmental and somatic dysfunction of lumbar region: Secondary | ICD-10-CM | POA: Diagnosis not present

## 2020-08-17 DIAGNOSIS — M9903 Segmental and somatic dysfunction of lumbar region: Secondary | ICD-10-CM | POA: Diagnosis not present

## 2020-08-17 DIAGNOSIS — M542 Cervicalgia: Secondary | ICD-10-CM | POA: Diagnosis not present

## 2020-08-17 DIAGNOSIS — M5416 Radiculopathy, lumbar region: Secondary | ICD-10-CM | POA: Diagnosis not present

## 2020-08-17 DIAGNOSIS — M9901 Segmental and somatic dysfunction of cervical region: Secondary | ICD-10-CM | POA: Diagnosis not present

## 2020-08-18 DIAGNOSIS — M9901 Segmental and somatic dysfunction of cervical region: Secondary | ICD-10-CM | POA: Diagnosis not present

## 2020-08-18 DIAGNOSIS — M9903 Segmental and somatic dysfunction of lumbar region: Secondary | ICD-10-CM | POA: Diagnosis not present

## 2020-08-18 DIAGNOSIS — M542 Cervicalgia: Secondary | ICD-10-CM | POA: Diagnosis not present

## 2020-08-18 DIAGNOSIS — M5416 Radiculopathy, lumbar region: Secondary | ICD-10-CM | POA: Diagnosis not present

## 2020-08-22 DIAGNOSIS — M542 Cervicalgia: Secondary | ICD-10-CM | POA: Diagnosis not present

## 2020-08-22 DIAGNOSIS — M9901 Segmental and somatic dysfunction of cervical region: Secondary | ICD-10-CM | POA: Diagnosis not present

## 2020-08-22 DIAGNOSIS — M9903 Segmental and somatic dysfunction of lumbar region: Secondary | ICD-10-CM | POA: Diagnosis not present

## 2020-08-22 DIAGNOSIS — M5416 Radiculopathy, lumbar region: Secondary | ICD-10-CM | POA: Diagnosis not present

## 2020-08-23 DIAGNOSIS — M542 Cervicalgia: Secondary | ICD-10-CM | POA: Diagnosis not present

## 2020-08-23 DIAGNOSIS — M5416 Radiculopathy, lumbar region: Secondary | ICD-10-CM | POA: Diagnosis not present

## 2020-08-23 DIAGNOSIS — M9903 Segmental and somatic dysfunction of lumbar region: Secondary | ICD-10-CM | POA: Diagnosis not present

## 2020-08-23 DIAGNOSIS — M9901 Segmental and somatic dysfunction of cervical region: Secondary | ICD-10-CM | POA: Diagnosis not present

## 2020-08-25 DIAGNOSIS — M542 Cervicalgia: Secondary | ICD-10-CM | POA: Diagnosis not present

## 2020-08-25 DIAGNOSIS — M9901 Segmental and somatic dysfunction of cervical region: Secondary | ICD-10-CM | POA: Diagnosis not present

## 2020-08-25 DIAGNOSIS — M9903 Segmental and somatic dysfunction of lumbar region: Secondary | ICD-10-CM | POA: Diagnosis not present

## 2020-08-25 DIAGNOSIS — M5416 Radiculopathy, lumbar region: Secondary | ICD-10-CM | POA: Diagnosis not present

## 2020-08-29 DIAGNOSIS — M9901 Segmental and somatic dysfunction of cervical region: Secondary | ICD-10-CM | POA: Diagnosis not present

## 2020-08-29 DIAGNOSIS — M542 Cervicalgia: Secondary | ICD-10-CM | POA: Diagnosis not present

## 2020-08-29 DIAGNOSIS — M9903 Segmental and somatic dysfunction of lumbar region: Secondary | ICD-10-CM | POA: Diagnosis not present

## 2020-08-29 DIAGNOSIS — M5416 Radiculopathy, lumbar region: Secondary | ICD-10-CM | POA: Diagnosis not present

## 2020-08-30 DIAGNOSIS — M5416 Radiculopathy, lumbar region: Secondary | ICD-10-CM | POA: Diagnosis not present

## 2020-08-30 DIAGNOSIS — M9903 Segmental and somatic dysfunction of lumbar region: Secondary | ICD-10-CM | POA: Diagnosis not present

## 2020-08-30 DIAGNOSIS — M542 Cervicalgia: Secondary | ICD-10-CM | POA: Diagnosis not present

## 2020-08-30 DIAGNOSIS — M9901 Segmental and somatic dysfunction of cervical region: Secondary | ICD-10-CM | POA: Diagnosis not present

## 2020-08-31 DIAGNOSIS — M9901 Segmental and somatic dysfunction of cervical region: Secondary | ICD-10-CM | POA: Diagnosis not present

## 2020-08-31 DIAGNOSIS — M9903 Segmental and somatic dysfunction of lumbar region: Secondary | ICD-10-CM | POA: Diagnosis not present

## 2020-08-31 DIAGNOSIS — M5416 Radiculopathy, lumbar region: Secondary | ICD-10-CM | POA: Diagnosis not present

## 2020-08-31 DIAGNOSIS — M542 Cervicalgia: Secondary | ICD-10-CM | POA: Diagnosis not present

## 2020-09-05 DIAGNOSIS — M9903 Segmental and somatic dysfunction of lumbar region: Secondary | ICD-10-CM | POA: Diagnosis not present

## 2020-09-05 DIAGNOSIS — M542 Cervicalgia: Secondary | ICD-10-CM | POA: Diagnosis not present

## 2020-09-05 DIAGNOSIS — M9901 Segmental and somatic dysfunction of cervical region: Secondary | ICD-10-CM | POA: Diagnosis not present

## 2020-09-05 DIAGNOSIS — M5416 Radiculopathy, lumbar region: Secondary | ICD-10-CM | POA: Diagnosis not present

## 2020-09-07 ENCOUNTER — Ambulatory Visit: Payer: Medicare Other | Admitting: Podiatry

## 2020-09-07 ENCOUNTER — Other Ambulatory Visit: Payer: Self-pay

## 2020-09-07 ENCOUNTER — Encounter: Payer: Self-pay | Admitting: Podiatry

## 2020-09-07 DIAGNOSIS — M9901 Segmental and somatic dysfunction of cervical region: Secondary | ICD-10-CM | POA: Diagnosis not present

## 2020-09-07 DIAGNOSIS — M7751 Other enthesopathy of right foot: Secondary | ICD-10-CM | POA: Diagnosis not present

## 2020-09-07 DIAGNOSIS — M5416 Radiculopathy, lumbar region: Secondary | ICD-10-CM | POA: Diagnosis not present

## 2020-09-07 DIAGNOSIS — D2371 Other benign neoplasm of skin of right lower limb, including hip: Secondary | ICD-10-CM

## 2020-09-07 DIAGNOSIS — M542 Cervicalgia: Secondary | ICD-10-CM | POA: Diagnosis not present

## 2020-09-07 DIAGNOSIS — M9903 Segmental and somatic dysfunction of lumbar region: Secondary | ICD-10-CM | POA: Diagnosis not present

## 2020-09-07 MED ORDER — DEXAMETHASONE SODIUM PHOSPHATE 120 MG/30ML IJ SOLN
2.0000 mg | Freq: Once | INTRAMUSCULAR | Status: AC
  Administered 2020-09-07: 2 mg via INTRA_ARTICULAR

## 2020-09-08 DIAGNOSIS — M9901 Segmental and somatic dysfunction of cervical region: Secondary | ICD-10-CM | POA: Diagnosis not present

## 2020-09-08 DIAGNOSIS — M9903 Segmental and somatic dysfunction of lumbar region: Secondary | ICD-10-CM | POA: Diagnosis not present

## 2020-09-08 DIAGNOSIS — M542 Cervicalgia: Secondary | ICD-10-CM | POA: Diagnosis not present

## 2020-09-08 DIAGNOSIS — M5416 Radiculopathy, lumbar region: Secondary | ICD-10-CM | POA: Diagnosis not present

## 2020-09-11 NOTE — Progress Notes (Signed)
She presents today chief complaint of painful lesion plantar aspect of the forefoot.  States that she continues to take her gabapentin on a regular basis.  States that she is currently wearing power steps because she was not able to purchase the orthotics.  Objective: Vital signs are stable alert oriented x3 pulses are palpable.  Reactive hyper keratoma submetatarsal.  There is a area of fluctuance beneath this.  Assessment bursitis and benign hyperkeratotic lesion.  Neuropathy.  Plan: Gabapentin will continue.  I injected dexamethasone local anesthetic debrided reactive hyperkeratotic lesion.  Follow-up with her as needed.

## 2020-09-12 DIAGNOSIS — M9901 Segmental and somatic dysfunction of cervical region: Secondary | ICD-10-CM | POA: Diagnosis not present

## 2020-09-12 DIAGNOSIS — M9903 Segmental and somatic dysfunction of lumbar region: Secondary | ICD-10-CM | POA: Diagnosis not present

## 2020-09-12 DIAGNOSIS — M542 Cervicalgia: Secondary | ICD-10-CM | POA: Diagnosis not present

## 2020-09-12 DIAGNOSIS — M5416 Radiculopathy, lumbar region: Secondary | ICD-10-CM | POA: Diagnosis not present

## 2020-09-14 LAB — HM DIABETES EYE EXAM

## 2020-09-15 DIAGNOSIS — M542 Cervicalgia: Secondary | ICD-10-CM | POA: Diagnosis not present

## 2020-09-15 DIAGNOSIS — M5416 Radiculopathy, lumbar region: Secondary | ICD-10-CM | POA: Diagnosis not present

## 2020-09-15 DIAGNOSIS — M9901 Segmental and somatic dysfunction of cervical region: Secondary | ICD-10-CM | POA: Diagnosis not present

## 2020-09-15 DIAGNOSIS — M9903 Segmental and somatic dysfunction of lumbar region: Secondary | ICD-10-CM | POA: Diagnosis not present

## 2020-09-19 DIAGNOSIS — M9901 Segmental and somatic dysfunction of cervical region: Secondary | ICD-10-CM | POA: Diagnosis not present

## 2020-09-19 DIAGNOSIS — M542 Cervicalgia: Secondary | ICD-10-CM | POA: Diagnosis not present

## 2020-09-19 DIAGNOSIS — M9903 Segmental and somatic dysfunction of lumbar region: Secondary | ICD-10-CM | POA: Diagnosis not present

## 2020-09-19 DIAGNOSIS — M5416 Radiculopathy, lumbar region: Secondary | ICD-10-CM | POA: Diagnosis not present

## 2020-09-21 DIAGNOSIS — M5416 Radiculopathy, lumbar region: Secondary | ICD-10-CM | POA: Diagnosis not present

## 2020-09-21 DIAGNOSIS — M9903 Segmental and somatic dysfunction of lumbar region: Secondary | ICD-10-CM | POA: Diagnosis not present

## 2020-09-21 DIAGNOSIS — M9901 Segmental and somatic dysfunction of cervical region: Secondary | ICD-10-CM | POA: Diagnosis not present

## 2020-09-21 DIAGNOSIS — M542 Cervicalgia: Secondary | ICD-10-CM | POA: Diagnosis not present

## 2020-09-28 DIAGNOSIS — M542 Cervicalgia: Secondary | ICD-10-CM | POA: Diagnosis not present

## 2020-09-28 DIAGNOSIS — M9901 Segmental and somatic dysfunction of cervical region: Secondary | ICD-10-CM | POA: Diagnosis not present

## 2020-09-28 DIAGNOSIS — M5416 Radiculopathy, lumbar region: Secondary | ICD-10-CM | POA: Diagnosis not present

## 2020-09-28 DIAGNOSIS — M9903 Segmental and somatic dysfunction of lumbar region: Secondary | ICD-10-CM | POA: Diagnosis not present

## 2020-10-04 DIAGNOSIS — M9903 Segmental and somatic dysfunction of lumbar region: Secondary | ICD-10-CM | POA: Diagnosis not present

## 2020-10-04 DIAGNOSIS — M5416 Radiculopathy, lumbar region: Secondary | ICD-10-CM | POA: Diagnosis not present

## 2020-10-04 DIAGNOSIS — M542 Cervicalgia: Secondary | ICD-10-CM | POA: Diagnosis not present

## 2020-10-04 DIAGNOSIS — M9901 Segmental and somatic dysfunction of cervical region: Secondary | ICD-10-CM | POA: Diagnosis not present

## 2020-10-11 ENCOUNTER — Other Ambulatory Visit: Payer: Self-pay | Admitting: Family Medicine

## 2020-10-11 DIAGNOSIS — M9903 Segmental and somatic dysfunction of lumbar region: Secondary | ICD-10-CM | POA: Diagnosis not present

## 2020-10-11 DIAGNOSIS — M542 Cervicalgia: Secondary | ICD-10-CM | POA: Diagnosis not present

## 2020-10-11 DIAGNOSIS — M9901 Segmental and somatic dysfunction of cervical region: Secondary | ICD-10-CM | POA: Diagnosis not present

## 2020-10-11 DIAGNOSIS — M5416 Radiculopathy, lumbar region: Secondary | ICD-10-CM | POA: Diagnosis not present

## 2020-10-18 DIAGNOSIS — M9901 Segmental and somatic dysfunction of cervical region: Secondary | ICD-10-CM | POA: Diagnosis not present

## 2020-10-18 DIAGNOSIS — M9903 Segmental and somatic dysfunction of lumbar region: Secondary | ICD-10-CM | POA: Diagnosis not present

## 2020-10-18 DIAGNOSIS — M542 Cervicalgia: Secondary | ICD-10-CM | POA: Diagnosis not present

## 2020-10-18 DIAGNOSIS — M5416 Radiculopathy, lumbar region: Secondary | ICD-10-CM | POA: Diagnosis not present

## 2020-10-23 ENCOUNTER — Encounter: Payer: Self-pay | Admitting: Family Medicine

## 2020-10-25 DIAGNOSIS — M542 Cervicalgia: Secondary | ICD-10-CM | POA: Diagnosis not present

## 2020-10-25 DIAGNOSIS — M5416 Radiculopathy, lumbar region: Secondary | ICD-10-CM | POA: Diagnosis not present

## 2020-10-25 DIAGNOSIS — M9903 Segmental and somatic dysfunction of lumbar region: Secondary | ICD-10-CM | POA: Diagnosis not present

## 2020-10-25 DIAGNOSIS — M9901 Segmental and somatic dysfunction of cervical region: Secondary | ICD-10-CM | POA: Diagnosis not present

## 2020-10-27 ENCOUNTER — Other Ambulatory Visit: Payer: Self-pay | Admitting: Family Medicine

## 2020-11-08 DIAGNOSIS — M542 Cervicalgia: Secondary | ICD-10-CM | POA: Diagnosis not present

## 2020-11-08 DIAGNOSIS — M9903 Segmental and somatic dysfunction of lumbar region: Secondary | ICD-10-CM | POA: Diagnosis not present

## 2020-11-08 DIAGNOSIS — M5416 Radiculopathy, lumbar region: Secondary | ICD-10-CM | POA: Diagnosis not present

## 2020-11-08 DIAGNOSIS — M9901 Segmental and somatic dysfunction of cervical region: Secondary | ICD-10-CM | POA: Diagnosis not present

## 2020-11-11 DIAGNOSIS — Z79899 Other long term (current) drug therapy: Secondary | ICD-10-CM | POA: Diagnosis not present

## 2020-11-11 DIAGNOSIS — L4050 Arthropathic psoriasis, unspecified: Secondary | ICD-10-CM | POA: Diagnosis not present

## 2020-11-22 DIAGNOSIS — M9903 Segmental and somatic dysfunction of lumbar region: Secondary | ICD-10-CM | POA: Diagnosis not present

## 2020-11-22 DIAGNOSIS — M5416 Radiculopathy, lumbar region: Secondary | ICD-10-CM | POA: Diagnosis not present

## 2020-11-22 DIAGNOSIS — M542 Cervicalgia: Secondary | ICD-10-CM | POA: Diagnosis not present

## 2020-11-22 DIAGNOSIS — M9901 Segmental and somatic dysfunction of cervical region: Secondary | ICD-10-CM | POA: Diagnosis not present

## 2020-12-06 DIAGNOSIS — M542 Cervicalgia: Secondary | ICD-10-CM | POA: Diagnosis not present

## 2020-12-06 DIAGNOSIS — M5416 Radiculopathy, lumbar region: Secondary | ICD-10-CM | POA: Diagnosis not present

## 2020-12-06 DIAGNOSIS — M9903 Segmental and somatic dysfunction of lumbar region: Secondary | ICD-10-CM | POA: Diagnosis not present

## 2020-12-06 DIAGNOSIS — M9901 Segmental and somatic dysfunction of cervical region: Secondary | ICD-10-CM | POA: Diagnosis not present

## 2020-12-14 DIAGNOSIS — M542 Cervicalgia: Secondary | ICD-10-CM | POA: Diagnosis not present

## 2020-12-14 DIAGNOSIS — M5416 Radiculopathy, lumbar region: Secondary | ICD-10-CM | POA: Diagnosis not present

## 2020-12-14 DIAGNOSIS — M9901 Segmental and somatic dysfunction of cervical region: Secondary | ICD-10-CM | POA: Diagnosis not present

## 2020-12-14 DIAGNOSIS — M9903 Segmental and somatic dysfunction of lumbar region: Secondary | ICD-10-CM | POA: Diagnosis not present

## 2020-12-20 DIAGNOSIS — M542 Cervicalgia: Secondary | ICD-10-CM | POA: Diagnosis not present

## 2020-12-20 DIAGNOSIS — M9903 Segmental and somatic dysfunction of lumbar region: Secondary | ICD-10-CM | POA: Diagnosis not present

## 2020-12-20 DIAGNOSIS — M5416 Radiculopathy, lumbar region: Secondary | ICD-10-CM | POA: Diagnosis not present

## 2020-12-20 DIAGNOSIS — M9901 Segmental and somatic dysfunction of cervical region: Secondary | ICD-10-CM | POA: Diagnosis not present

## 2020-12-27 DIAGNOSIS — M5416 Radiculopathy, lumbar region: Secondary | ICD-10-CM | POA: Diagnosis not present

## 2020-12-27 DIAGNOSIS — M9903 Segmental and somatic dysfunction of lumbar region: Secondary | ICD-10-CM | POA: Diagnosis not present

## 2020-12-27 DIAGNOSIS — M9901 Segmental and somatic dysfunction of cervical region: Secondary | ICD-10-CM | POA: Diagnosis not present

## 2020-12-27 DIAGNOSIS — M542 Cervicalgia: Secondary | ICD-10-CM | POA: Diagnosis not present

## 2021-01-04 DIAGNOSIS — M542 Cervicalgia: Secondary | ICD-10-CM | POA: Diagnosis not present

## 2021-01-04 DIAGNOSIS — M9903 Segmental and somatic dysfunction of lumbar region: Secondary | ICD-10-CM | POA: Diagnosis not present

## 2021-01-04 DIAGNOSIS — M9901 Segmental and somatic dysfunction of cervical region: Secondary | ICD-10-CM | POA: Diagnosis not present

## 2021-01-04 DIAGNOSIS — M5416 Radiculopathy, lumbar region: Secondary | ICD-10-CM | POA: Diagnosis not present

## 2021-01-10 ENCOUNTER — Ambulatory Visit: Payer: Medicare Other | Admitting: Family Medicine

## 2021-01-10 DIAGNOSIS — M5416 Radiculopathy, lumbar region: Secondary | ICD-10-CM | POA: Diagnosis not present

## 2021-01-10 DIAGNOSIS — M9901 Segmental and somatic dysfunction of cervical region: Secondary | ICD-10-CM | POA: Diagnosis not present

## 2021-01-10 DIAGNOSIS — M542 Cervicalgia: Secondary | ICD-10-CM | POA: Diagnosis not present

## 2021-01-10 DIAGNOSIS — M9903 Segmental and somatic dysfunction of lumbar region: Secondary | ICD-10-CM | POA: Diagnosis not present

## 2021-01-17 DIAGNOSIS — M9901 Segmental and somatic dysfunction of cervical region: Secondary | ICD-10-CM | POA: Diagnosis not present

## 2021-01-17 DIAGNOSIS — M542 Cervicalgia: Secondary | ICD-10-CM | POA: Diagnosis not present

## 2021-01-17 DIAGNOSIS — M5416 Radiculopathy, lumbar region: Secondary | ICD-10-CM | POA: Diagnosis not present

## 2021-01-17 DIAGNOSIS — M9903 Segmental and somatic dysfunction of lumbar region: Secondary | ICD-10-CM | POA: Diagnosis not present

## 2021-01-24 ENCOUNTER — Other Ambulatory Visit: Payer: Self-pay | Admitting: Family Medicine

## 2021-01-24 DIAGNOSIS — M9901 Segmental and somatic dysfunction of cervical region: Secondary | ICD-10-CM | POA: Diagnosis not present

## 2021-01-24 DIAGNOSIS — M542 Cervicalgia: Secondary | ICD-10-CM | POA: Diagnosis not present

## 2021-01-24 DIAGNOSIS — M5416 Radiculopathy, lumbar region: Secondary | ICD-10-CM | POA: Diagnosis not present

## 2021-01-24 DIAGNOSIS — M9903 Segmental and somatic dysfunction of lumbar region: Secondary | ICD-10-CM | POA: Diagnosis not present

## 2021-02-07 DIAGNOSIS — M542 Cervicalgia: Secondary | ICD-10-CM | POA: Diagnosis not present

## 2021-02-07 DIAGNOSIS — M5416 Radiculopathy, lumbar region: Secondary | ICD-10-CM | POA: Diagnosis not present

## 2021-02-07 DIAGNOSIS — M9901 Segmental and somatic dysfunction of cervical region: Secondary | ICD-10-CM | POA: Diagnosis not present

## 2021-02-07 DIAGNOSIS — M9903 Segmental and somatic dysfunction of lumbar region: Secondary | ICD-10-CM | POA: Diagnosis not present

## 2021-02-08 ENCOUNTER — Other Ambulatory Visit: Payer: Self-pay | Admitting: Family Medicine

## 2021-02-08 NOTE — Telephone Encounter (Signed)
Requested Prescriptions  Pending Prescriptions Disp Refills  . losartan (COZAAR) 25 MG tablet [Pharmacy Med Name: LOSARTAN POTASSIUM 25 MG TAB] 45 tablet 0    Sig: TAKE 1/2 TABLET (12.5 MG TOTAL) BY MOUTHONCE DAILY     Cardiovascular:  Angiotensin Receptor Blockers Failed - 02/08/2021  9:46 AM      Failed - Cr in normal range and within 180 days    Creatinine  Date Value Ref Range Status  05/19/2020 0.8 0.5 - 1.1 Final  04/16/2014 0.68 0.60 - 1.30 mg/dL Final   Creatinine, Ser  Date Value Ref Range Status  01/25/2020 0.64 0.57 - 1.00 mg/dL Final   Creatinine, POC  Date Value Ref Range Status  01/01/2019 NA\ mg/dL Final         Failed - K in normal range and within 180 days    Potassium  Date Value Ref Range Status  05/19/2020 4.8 3.4 - 5.3 Final         Failed - Last BP in normal range    BP Readings from Last 1 Encounters:  07/04/20 (!) 163/83         Failed - Valid encounter within last 6 months    Recent Outpatient Visits          7 months ago Allergic rhinitis with postnasal drip   [facility] [place], Dionne Bucy, MD   7 months ago Viral URI   [facility] [place], Dionne Bucy, MD   1 year ago Type 2 diabetes mellitus with other specified complication, without long-term current use of insulin [facility])   [facility], Dionne Bucy, MD   1 year ago Type 2 diabetes mellitus with other specified complication, without long-term current use of insulin [facility])   [facility], Dionne Bucy, MD   1 year ago Welcome to [employer] preventive visit   [facility] Bacigalupo, Dionne Bucy, MD      Future Appointments            In 1 month Bacigalupo, Dionne Bucy, MD [facility] [place] - Patient is not pregnant

## 2021-02-21 DIAGNOSIS — M542 Cervicalgia: Secondary | ICD-10-CM | POA: Diagnosis not present

## 2021-02-21 DIAGNOSIS — M9903 Segmental and somatic dysfunction of lumbar region: Secondary | ICD-10-CM | POA: Diagnosis not present

## 2021-02-21 DIAGNOSIS — M5416 Radiculopathy, lumbar region: Secondary | ICD-10-CM | POA: Diagnosis not present

## 2021-02-21 DIAGNOSIS — M9901 Segmental and somatic dysfunction of cervical region: Secondary | ICD-10-CM | POA: Diagnosis not present

## 2021-03-06 DIAGNOSIS — M5416 Radiculopathy, lumbar region: Secondary | ICD-10-CM | POA: Diagnosis not present

## 2021-03-06 DIAGNOSIS — M9901 Segmental and somatic dysfunction of cervical region: Secondary | ICD-10-CM | POA: Diagnosis not present

## 2021-03-06 DIAGNOSIS — M542 Cervicalgia: Secondary | ICD-10-CM | POA: Diagnosis not present

## 2021-03-06 DIAGNOSIS — M9903 Segmental and somatic dysfunction of lumbar region: Secondary | ICD-10-CM | POA: Diagnosis not present

## 2021-03-07 ENCOUNTER — Other Ambulatory Visit: Payer: Self-pay | Admitting: Family Medicine

## 2021-03-07 NOTE — Telephone Encounter (Signed)
Requested Prescriptions  Pending Prescriptions Disp Refills  . fluticasone (FLONASE) 50 MCG/ACT nasal spray [Pharmacy Med Name: FLUTICASONE PROPIONATE 50 MCG/ACT N] 16 g 2    Sig: PLACE 2 SPRAYS INTO EACH NOSTRIL ONCE DAILY     Ear, Nose, and Throat: Nasal Preparations - Corticosteroids Passed - 03/07/2021  1:03 PM      Passed - Valid encounter within last 12 months    Recent Outpatient Visits          8 months ago Allergic rhinitis with postnasal drip   [facility] [place], Dionne Bucy, MD   8 months ago Viral URI   [facility] [place], Dionne Bucy, MD   1 year ago Type 2 diabetes mellitus with other specified complication, without long-term current use of insulin [facility])   [facility], Dionne Bucy, MD   1 year ago Type 2 diabetes mellitus with other specified complication, without long-term current use of insulin [facility])   [facility], Dionne Bucy, MD   1 year ago Welcome to [employer] preventive visit   [facility] Bacigalupo, Dionne Bucy, MD      Future Appointments            In 1 week Bacigalupo, Dionne Bucy, MD [facility] [place]

## 2021-03-08 ENCOUNTER — Other Ambulatory Visit: Payer: Self-pay

## 2021-03-08 ENCOUNTER — Encounter: Payer: Self-pay | Admitting: Podiatry

## 2021-03-08 ENCOUNTER — Ambulatory Visit: Payer: Medicare Other | Admitting: Podiatry

## 2021-03-08 DIAGNOSIS — D2371 Other benign neoplasm of skin of right lower limb, including hip: Secondary | ICD-10-CM

## 2021-03-08 DIAGNOSIS — M7751 Other enthesopathy of right foot: Secondary | ICD-10-CM | POA: Diagnosis not present

## 2021-03-08 MED ORDER — DEXAMETHASONE SODIUM PHOSPHATE 120 MG/30ML IJ SOLN
2.0000 mg | Freq: Once | INTRAMUSCULAR | Status: AC
  Administered 2021-03-08: 2 mg via INTRA_ARTICULAR

## 2021-03-08 NOTE — Progress Notes (Signed)
She presents today for follow-up of her metatarsalgia right foot.  States that the injection really helped previously.  Currently it seems to be worse right and here she points to the plantar aspect of the second intermetatarsal space.  Objective: Pulses are palpable right.  She has pain on palpation to the second interdigital space severe hammertoe deformities #2 #3 with hallux varus deformity and interphalangeus.  Assessment: Neuropathy with capsulitis and neuritis plantar aspect second metatarsal intermetatarsal space.  Plan: Injected dexamethasone local anesthetic as we did before.  Follow-up with her in 2 months if necessary sooner

## 2021-03-09 DIAGNOSIS — L821 Other seborrheic keratosis: Secondary | ICD-10-CM | POA: Diagnosis not present

## 2021-03-09 DIAGNOSIS — L812 Freckles: Secondary | ICD-10-CM | POA: Diagnosis not present

## 2021-03-09 DIAGNOSIS — L570 Actinic keratosis: Secondary | ICD-10-CM | POA: Diagnosis not present

## 2021-03-09 DIAGNOSIS — D1801 Hemangioma of skin and subcutaneous tissue: Secondary | ICD-10-CM | POA: Diagnosis not present

## 2021-03-09 DIAGNOSIS — L400 Psoriasis vulgaris: Secondary | ICD-10-CM | POA: Diagnosis not present

## 2021-03-09 DIAGNOSIS — Z808 Family history of malignant neoplasm of other organs or systems: Secondary | ICD-10-CM | POA: Diagnosis not present

## 2021-03-09 DIAGNOSIS — D225 Melanocytic nevi of trunk: Secondary | ICD-10-CM | POA: Diagnosis not present

## 2021-03-09 DIAGNOSIS — Z85828 Personal history of other malignant neoplasm of skin: Secondary | ICD-10-CM | POA: Diagnosis not present

## 2021-03-09 DIAGNOSIS — Z1283 Encounter for screening for malignant neoplasm of skin: Secondary | ICD-10-CM | POA: Diagnosis not present

## 2021-03-17 ENCOUNTER — Encounter: Payer: Self-pay | Admitting: Family Medicine

## 2021-03-17 ENCOUNTER — Other Ambulatory Visit: Payer: Self-pay

## 2021-03-17 ENCOUNTER — Ambulatory Visit (INDEPENDENT_AMBULATORY_CARE_PROVIDER_SITE_OTHER): Payer: Medicare Other | Admitting: Family Medicine

## 2021-03-17 VITALS — BP 140/80 | HR 70 | Temp 98.0°F | Resp 16 | Ht 61.5 in | Wt 149.2 lb

## 2021-03-17 DIAGNOSIS — R0609 Other forms of dyspnea: Secondary | ICD-10-CM | POA: Diagnosis not present

## 2021-03-17 DIAGNOSIS — E1159 Type 2 diabetes mellitus with other circulatory complications: Secondary | ICD-10-CM | POA: Diagnosis not present

## 2021-03-17 DIAGNOSIS — I152 Hypertension secondary to endocrine disorders: Secondary | ICD-10-CM | POA: Diagnosis not present

## 2021-03-17 DIAGNOSIS — E785 Hyperlipidemia, unspecified: Secondary | ICD-10-CM | POA: Diagnosis not present

## 2021-03-17 DIAGNOSIS — E1169 Type 2 diabetes mellitus with other specified complication: Secondary | ICD-10-CM | POA: Diagnosis not present

## 2021-03-17 LAB — POCT GLYCOSYLATED HEMOGLOBIN (HGB A1C)
Hemoglobin A1C: 5.8 % — AB (ref 4.0–5.6)
PT: 120

## 2021-03-17 MED ORDER — EZETIMIBE 10 MG PO TABS: 10.0000 mg | ORAL_TABLET | ORAL | 3 refills | Status: DC

## 2021-03-17 NOTE — Assessment & Plan Note (Signed)
BP 140/80 in office Check home BP Continue current medications

## 2021-03-17 NOTE — Assessment & Plan Note (Addendum)
Continue ezetimibe  Continue CoQ10 History of myalgias due to statins Recheck CMP and FLP

## 2021-03-17 NOTE — Assessment & Plan Note (Signed)
New onset Family history of arrhythmias and SVT Referral to cardiology

## 2021-03-17 NOTE — Assessment & Plan Note (Signed)
Well controlled, POCT A1C 5.8 today Continue diet control Passed foot exam today UTD on vaccines, eye exam On ARB Discussed diet and exercise F/u in 6 months

## 2021-03-17 NOTE — Progress Notes (Signed)
Established patient visit   Patient: Ashley Rodriguez   DOB: 02-Oct-1950   70 y.o. Female  MRN: [id number] Visit Date: 03/17/2021  Today's healthcare provider: Lavon Paganini, MD   Chief Complaint  Patient presents with   Follow-up   Subjective     Taking ezetimibe 3x a week. She still has some myalgias on the day that she takes her medication. Allergies improved w/o use of nasal spray. Still taking flonase, allegra, and singulair every night. Seeing pain management and chiropractor for back pain Has had dyspnea on exertion for the past few months Fam hx of arrhythmia and SVT   Medications: Outpatient Medications Prior to Visit  Medication Sig   acetaminophen (TYLENOL) 325 MG tablet Take 325 mg by mouth every 6 (six) hours as needed.   amLODipine (NORVASC) 5 MG tablet TAKE 1 TABLET BY MOUTH ONCE A DAY AS NEEDED FOR BLOOD PRESSURE   atenolol (TENORMIN) 50 MG tablet TAKE 1 TABLET BY MOUTH ONCE DAILY   betamethasone dipropionate 0.05 % lotion Apply 1 application topically as needed.    calcium carbonate (OS-CAL) 600 MG TABS tablet Take 600 mg by mouth daily with breakfast.   co-enzyme Q-10 30 MG capsule Take 100 mg by mouth daily.   ENBREL SURECLICK 50 MG/ML injection Inject 50 mg into the skin once a week.    esomeprazole (NEXIUM) 40 MG capsule TAKE 1 CAPSULE BY MOUTH ONCE DAILY   fexofenadine (ALLEGRA) 180 MG tablet Take 180 mg by mouth daily.   fluticasone (FLONASE) 50 MCG/ACT nasal spray PLACE 2 SPRAYS INTO EACH NOSTRIL ONCE DAILY   gabapentin (NEURONTIN) 300 MG capsule Take 2 capsules AM and 3 capsules PM   losartan (COZAAR) 25 MG tablet TAKE 1/2 TABLET (12.5 MG TOTAL) BY MOUTHONCE DAILY   meloxicam (MOBIC) 15 MG tablet Take 15 mg by mouth daily.    montelukast (SINGULAIR) 10 MG tablet TAKE 1 TABLET BY MOUTH AT BEDTIME   Multiple Vitamins-Minerals (MULTIVITAMIN WITH MINERALS) tablet Take 1 tablet by mouth daily.   TURMERIC PO Take by mouth daily at 6 (six) AM.    valACYclovir (VALTREX) 1000 MG tablet TAKE 2 TABLETS BY MOUTH TWICE DAILY FOR 1 DAY WHEN FEVER BLISTER OCCURS   [DISCONTINUED] ezetimibe (ZETIA) 10 MG tablet Take 1 tablet (10 mg total) by mouth daily.   azelastine (ASTELIN) 0.1 % nasal spray Place 2 sprays into both nostrils 2 (two) times daily. Use in each nostril as directed (Patient not taking: Reported on 03/17/2021)   [DISCONTINUED] Red Yeast Rice 600 MG CAPS Take by mouth daily.   Facility-Administered Medications Prior to Visit  Medication Dose Route Frequency Provider   triamcinolone acetonide (KENALOG-40) injection 60 mg  60 mg Other Once [place], Max T, DPM    Review of Systems  Constitutional: Negative.   Eyes: Negative.   Respiratory:  Positive for shortness of breath.   Cardiovascular: Negative.   Endocrine: Negative.   Genitourinary: Negative.   Musculoskeletal:  Positive for arthralgias, back pain and myalgias.  Neurological: Negative.   Psychiatric/Behavioral:  Positive for sleep disturbance.       Objective    BP 140/80 (BP Location: Right Arm, Patient Position: Sitting, Cuff Size: Normal)   Pulse 70   Temp 98 F (36.7 C) (Oral)   Resp 16   Ht 5' 1.5" (1.562 m)   Wt 149 lb 3.2 oz (67.7 kg)   SpO2 96%   BMI 27.73 kg/m  {Show previous vital signs (optional):23777}  Physical  Exam Vitals reviewed.  Constitutional:      General: She is not in acute distress.    Appearance: Normal appearance. She is not ill-appearing or toxic-appearing.  HENT:     Head: Normocephalic and atraumatic.     Right Ear: External ear normal.     Left Ear: External ear normal.     Nose: Nose normal.     Mouth/Throat:     Mouth: Mucous membranes are moist.     Pharynx: Oropharynx is clear. No oropharyngeal exudate or posterior oropharyngeal erythema.  Eyes:     General: No scleral icterus.    Extraocular Movements: Extraocular movements intact.     Conjunctiva/sclera: Conjunctivae normal.     Pupils: Pupils are equal, round, and  reactive to light.  Cardiovascular:     Rate and Rhythm: Normal rate and regular rhythm.     Pulses: Normal pulses.     Heart sounds: Normal heart sounds. No murmur heard.   No friction rub. No gallop.  Pulmonary:     Effort: Pulmonary effort is normal. No respiratory distress.     Breath sounds: Normal breath sounds. No wheezing or rhonchi.  Chest:     Chest wall: No tenderness.  Abdominal:     General: Abdomen is flat. There is no distension.     Palpations: Abdomen is soft.     Tenderness: There is no abdominal tenderness.  Musculoskeletal:     Cervical back: Normal range of motion and neck supple.     Right lower leg: No edema.     Left lower leg: No edema.  Skin:    General: Skin is warm and dry.     Capillary Refill: Capillary refill takes less than 2 seconds.     Findings: No lesion or rash.  Neurological:     General: No focal deficit present.     Mental Status: She is alert and oriented to person, place, and time. Mental status is at baseline.  Psychiatric:        Mood and Affect: Mood normal.      Results for orders placed or performed in visit on 03/17/21  POCT glycosylated hemoglobin (Hb A1C)  Result Value Ref Range   Hemoglobin A1C 5.8 (A) 4.0 - 5.6 %   PT 120     Assessment & Plan     Problem List Items Addressed This Visit       Cardiovascular and Mediastinum   Hypertension associated with diabetes ([place]) - Primary    BP 140/80 in office Check home BP Continue current medications      Relevant Medications   ezetimibe (ZETIA) 10 MG tablet   Other Relevant Orders   Comprehensive metabolic panel     Endocrine   T2DM (type 2 diabetes mellitus) ([place])    Well controlled, POCT A1C 5.8 today Continue diet control Passed foot exam today UTD on vaccines, eye exam On ARB Discussed diet and exercise F/u in 6 months       Relevant Orders   POCT glycosylated hemoglobin (Hb A1C) (Completed)   Hyperlipidemia associated with type 2 diabetes mellitus  (HCC)    Continue ezetimibe  Continue CoQ10 History of myalgias due to statins Recheck CMP and FLP      Relevant Medications   ezetimibe (ZETIA) 10 MG tablet   Other Relevant Orders   Comprehensive metabolic panel   Lipid panel     Other   DOE (dyspnea on exertion)    New onset Family history  of arrhythmias and SVT Referral to cardiology      Relevant Orders   Ambulatory referral to Cardiology     Return in about 6 months (around 09/14/2021) for CPE, AWV.      Nelva Nay, Medical Student 03/17/2021, 11:13 AM   Patient seen along with MS3 student Nelva Nay. I personally evaluated this patient along with the student, and verified all aspects of the history, physical exam, and medical decision making as documented by the student. I agree with the student's documentation and have made all necessary edits.  Jerry Haugen, Dionne Bucy, MD, MPH [facility]

## 2021-03-18 LAB — COMPREHENSIVE METABOLIC PANEL
ALT: 26 IU/L (ref 0–32)
AST: 28 IU/L (ref 0–40)
Albumin/Globulin Ratio: 2.3 — ABNORMAL HIGH (ref 1.2–2.2)
Albumin: 5.1 g/dL — ABNORMAL HIGH (ref 3.8–4.8)
Alkaline Phosphatase: 75 IU/L (ref 44–121)
BUN/Creatinine Ratio: 17 (ref 12–28)
BUN: 10 mg/dL (ref 8–27)
Bilirubin Total: 0.5 mg/dL (ref 0.0–1.2)
CO2: 28 mmol/L (ref 20–29)
Calcium: 10 mg/dL (ref 8.7–10.3)
Chloride: 100 mmol/L (ref 96–106)
Creatinine, Ser: 0.58 mg/dL (ref 0.57–1.00)
Globulin, Total: 2.2 g/dL (ref 1.5–4.5)
Glucose: 105 mg/dL — ABNORMAL HIGH (ref 70–99)
Potassium: 4.8 mmol/L (ref 3.5–5.2)
Sodium: 141 mmol/L (ref 134–144)
Total Protein: 7.3 g/dL (ref 6.0–8.5)
eGFR: 99 mL/min/{1.73_m2} (ref >59)

## 2021-03-18 LAB — LIPID PANEL
Chol/HDL Ratio: 3.9 ratio (ref 0.0–4.4)
Cholesterol, Total: 194 mg/dL (ref 100–199)
HDL: 50 mg/dL (ref >39)
LDL Chol Calc (NIH): 117 mg/dL — ABNORMAL HIGH (ref 0–99)
Triglycerides: 151 mg/dL — ABNORMAL HIGH (ref 0–149)
VLDL Cholesterol Cal: 27 mg/dL (ref 5–40)

## 2021-03-20 DIAGNOSIS — M9903 Segmental and somatic dysfunction of lumbar region: Secondary | ICD-10-CM | POA: Diagnosis not present

## 2021-03-20 DIAGNOSIS — M5416 Radiculopathy, lumbar region: Secondary | ICD-10-CM | POA: Diagnosis not present

## 2021-03-20 DIAGNOSIS — M9901 Segmental and somatic dysfunction of cervical region: Secondary | ICD-10-CM | POA: Diagnosis not present

## 2021-03-20 DIAGNOSIS — M542 Cervicalgia: Secondary | ICD-10-CM | POA: Diagnosis not present

## 2021-03-21 ENCOUNTER — Other Ambulatory Visit: Payer: Self-pay | Admitting: Family Medicine

## 2021-03-21 DIAGNOSIS — Z1231 Encounter for screening mammogram for malignant neoplasm of breast: Secondary | ICD-10-CM

## 2021-04-03 ENCOUNTER — Other Ambulatory Visit: Payer: Self-pay

## 2021-04-03 ENCOUNTER — Ambulatory Visit
Admission: RE | Admit: 2021-04-03 | Discharge: 2021-04-03 | Disposition: A | Discharge status: Home / Self Care | Payer: Medicare Other | Source: Ambulatory Visit | Attending: Family Medicine | Admitting: Family Medicine

## 2021-04-03 DIAGNOSIS — M9901 Segmental and somatic dysfunction of cervical region: Secondary | ICD-10-CM | POA: Diagnosis not present

## 2021-04-03 DIAGNOSIS — M542 Cervicalgia: Secondary | ICD-10-CM | POA: Diagnosis not present

## 2021-04-03 DIAGNOSIS — Z1231 Encounter for screening mammogram for malignant neoplasm of breast: Secondary | ICD-10-CM | POA: Insufficient documentation

## 2021-04-03 DIAGNOSIS — M5416 Radiculopathy, lumbar region: Secondary | ICD-10-CM | POA: Diagnosis not present

## 2021-04-03 DIAGNOSIS — M9903 Segmental and somatic dysfunction of lumbar region: Secondary | ICD-10-CM | POA: Diagnosis not present

## 2021-04-14 ENCOUNTER — Telehealth: Payer: Self-pay | Admitting: Cardiovascular Disease

## 2021-04-14 ENCOUNTER — Encounter: Payer: Self-pay | Admitting: Cardiovascular Disease

## 2021-04-14 ENCOUNTER — Other Ambulatory Visit: Payer: Self-pay

## 2021-04-14 ENCOUNTER — Ambulatory Visit: Payer: Medicare Other | Admitting: Cardiovascular Disease

## 2021-04-14 VITALS — BP 138/74 | HR 67 | Ht 61.5 in | Wt 151.1 lb

## 2021-04-14 DIAGNOSIS — E785 Hyperlipidemia, unspecified: Secondary | ICD-10-CM

## 2021-04-14 DIAGNOSIS — T466X5A Adverse effect of antihyperlipidemic and antiarteriosclerotic drugs, initial encounter: Secondary | ICD-10-CM | POA: Diagnosis not present

## 2021-04-14 DIAGNOSIS — G894 Chronic pain syndrome: Secondary | ICD-10-CM

## 2021-04-14 DIAGNOSIS — E1159 Type 2 diabetes mellitus with other circulatory complications: Secondary | ICD-10-CM

## 2021-04-14 DIAGNOSIS — R0609 Other forms of dyspnea: Secondary | ICD-10-CM | POA: Diagnosis not present

## 2021-04-14 DIAGNOSIS — M791 Myalgia, unspecified site: Secondary | ICD-10-CM | POA: Diagnosis not present

## 2021-04-14 DIAGNOSIS — E1169 Type 2 diabetes mellitus with other specified complication: Secondary | ICD-10-CM

## 2021-04-14 DIAGNOSIS — I152 Hypertension secondary to endocrine disorders: Secondary | ICD-10-CM | POA: Diagnosis not present

## 2021-04-14 NOTE — Progress Notes (Signed)
Cardiology Office Note  Date:  04/14/2021   ID:  Ashley Rodriguez, DOB [birth year], MRN [id number]  PCP:  Virginia Crews, MD   Chief Complaint  Patient presents with   Other    SOB. Meds reviewed verbally with pt.    HPI:  Ashley Rodriguez is a 70 year old woman with past medical history of COVID August 2020 Hypertension Diabetes type 2 Presenting by referral from Dr. Lavon Paganini for shortness of breath/dyspnea on exertion  Lost mother summer 2022 Chronic bad back, scoliosis limits activities, exercise, walking  Walks until her back or leg hurts,  She feels that her SOB is "worse from back pain" in the morning Severe back pain in AM, left leg pain/sciatic  Does the [employer], 1-2 x a week Walks, golf  "Don't sleep too well", fatigue/tired Neuropathy in feet, walks her up  Lab Results  Component Value Date   CHOL 194 03/17/2021   HDL 50 03/17/2021   LDLCALC 117 (H) 03/17/2021   TRIG 151 (H) 03/17/2021     EKG personally reviewed by myself on todays visit NSR rate rate 67 bpm, LAD   PMH:   has a past medical history of COVID-19 (12/15/2018), Flank lipoma (08/17/2016), Hypertension, Neck mass (04/07/2013), Psoriatic arthritis ([place]), Recurrent cellulitis, and T2DM (type 2 diabetes mellitus) ([place]) (10/01/2018).  PSH:    Past Surgical History:  Procedure Laterality Date   ABDOMINAL HYSTERECTOMY  2000   COLONOSCOPY  2015   lipoma removed  04/2012   on neck   NECK MASS EXCISION  04/28/14   lipoma    Current Outpatient Medications  Medication Sig Dispense Refill   acetaminophen (TYLENOL) 325 MG tablet Take 325 mg by mouth every 6 (six) hours as needed.     amLODipine (NORVASC) 5 MG tablet TAKE 1 TABLET BY MOUTH ONCE A DAY AS NEEDED FOR BLOOD PRESSURE 90 tablet 3   atenolol (TENORMIN) 50 MG tablet TAKE 1 TABLET BY MOUTH ONCE DAILY 90 tablet 0   betamethasone dipropionate 0.05 % lotion Apply 1 application topically as needed.      calcium carbonate (OS-CAL) 600 MG TABS  tablet Take 600 mg by mouth daily with breakfast.     co-enzyme Q-10 30 MG capsule Take 100 mg by mouth daily.     ENBREL SURECLICK 50 MG/ML injection Inject 50 mg into the skin once a week.   4   esomeprazole (NEXIUM) 40 MG capsule TAKE 1 CAPSULE BY MOUTH ONCE DAILY 90 capsule 3   ezetimibe (ZETIA) 10 MG tablet Take 1 tablet (10 mg total) by mouth 3 (three) times a week. 45 tablet 3   fexofenadine (ALLEGRA) 180 MG tablet Take 180 mg by mouth daily.     fluticasone (FLONASE) 50 MCG/ACT nasal spray PLACE 2 SPRAYS INTO EACH NOSTRIL ONCE DAILY 16 g 2   gabapentin (NEURONTIN) 300 MG capsule Take 2 capsules AM and 3 capsules PM 450 capsule 3   losartan (COZAAR) 25 MG tablet TAKE 1/2 TABLET (12.5 MG TOTAL) BY MOUTHONCE DAILY 45 tablet 0   meloxicam (MOBIC) 15 MG tablet Take 15 mg by mouth daily.   2   montelukast (SINGULAIR) 10 MG tablet TAKE 1 TABLET BY MOUTH AT BEDTIME 90 tablet 3   Multiple Vitamins-Minerals (MULTIVITAMIN WITH MINERALS) tablet Take 1 tablet by mouth daily.     TURMERIC PO Take by mouth daily at 6 (six) AM.     valACYclovir (VALTREX) 1000 MG tablet TAKE 2 TABLETS BY MOUTH TWICE DAILY FOR 1  DAY WHEN FEVER BLISTER OCCURS 30 tablet 0   Current Facility-Administered Medications  Medication Dose Route Frequency Provider Last Rate Last Admin   triamcinolone acetonide (KENALOG-40) injection 60 mg  60 mg Other Once [place], Max T, DPM         Allergies:   Atorvastatin, Codeine, and Sulfa antibiotics   Social History:  The patient  reports that she has never smoked. She has never used smokeless tobacco. She reports current alcohol use. She reports that she does not use drugs.   Family History:   family history includes Arrhythmia in her nephew; Atrial fibrillation in her brother and mother; Cancer in her father; Hypertension in her mother; Supraventricular tachycardia in her brother.    Review of Systems: Review of Systems  Constitutional: Negative.   HENT: Negative.    Respiratory:   Positive for shortness of breath.   Cardiovascular: Negative.   Gastrointestinal: Negative.   Musculoskeletal: Negative.   Neurological: Negative.   Psychiatric/Behavioral: Negative.    All other systems reviewed and are negative.   PHYSICAL EXAM: VS:  BP 138/74 (BP Location: Right Arm, Patient Position: Sitting, Cuff Size: Normal)    Pulse 67    Ht 5' 1.5" (1.562 m)    Wt 151 lb 2 oz (68.5 kg)    SpO2 98%    BMI 28.09 kg/m  , BMI Body mass index is 28.09 kg/m. GEN: Well nourished, well developed, in no acute distress HEENT: normal Neck: no JVD, carotid bruits, or masses Cardiac: RRR; no murmurs, rubs, or gallops,no edema  Respiratory:  clear to auscultation bilaterally, normal work of breathing GI: soft, nontender, nondistended, + BS Ashley: no deformity or atrophy Skin: warm and dry, no rash Neuro:  Strength and sensation are intact Psych: euthymic mood, full affect  Recent Labs: 05/19/2020: Hemoglobin 14.6; Platelets 395 03/17/2021: ALT 26; BUN 10; Creatinine, Ser 0.58; Potassium 4.8; Sodium 141    Lipid Panel Lab Results  Component Value Date   CHOL 194 03/17/2021   HDL 50 03/17/2021   LDLCALC 117 (H) 03/17/2021   TRIG 151 (H) 03/17/2021      Wt Readings from Last 3 Encounters:  04/14/21 151 lb 2 oz (68.5 kg)  03/17/21 149 lb 3.2 oz (67.7 kg)  07/04/20 142 lb (64.4 kg)      ASSESSMENT AND PLAN:  Problem List Items Addressed This Visit       Cardiology Problems   Hyperlipidemia associated with type 2 diabetes mellitus ([place])   Relevant Orders   EKG 12-Lead   Hypertension associated with diabetes ([place]) - Primary   Relevant Orders   EKG 12-Lead     Other   Chronic pain syndrome (Chronic)   Other Visit Diagnoses     Dyspnea on exertion       Relevant Orders   EKG 12-Lead   ECHOCARDIOGRAM COMPLETE      Shortness of breath Likely atypical in nature, no regular exercise program Suspect secondary to conditioning Activities limited by chronic back pain,  leg pain/sciatica We have recommended regular walking program, exercise Consider water aerobics, low impact exercise such as recumbent bike, NuStep Echocardiogram ordered to rule out cardiac etiology Less likely angina, will hold off on stress testing at this time Discussed CT coronary calcium score for risk stratification, she will call us if she would like this ordered  Chronic pain syndrome Prior cortisone shots, reports long history of prednisone use Recommended low-grade stretching exercises, low impact exercises  Essential hypertension Blood pressure is well  controlled on today's visit. No changes made to the medications.  Hyperlipidemia On Zetia Reports statin myalgia  Diabetes type 2 A1c stable high 5, 6 range   Total encounter time more than 60 minutes  Greater than 50% was spent in counseling and coordination of care with the patient    Signed, Esmond Plants, M.D., Ph.D. [place], [place]

## 2021-04-14 NOTE — Patient Instructions (Addendum)
Medication Instructions:  No changes  If you need a refill on your cardiac medications before your next appointment, please call your pharmacy.   Lab work: No new labs needed  Testing/Procedures: Your physician has requested that you have an echocardiogram. Echocardiography is a painless test that uses sound waves to create images of your heart. It provides your doctor with information about the size and shape of your heart and how well your hearts chambers and valves are working. This procedure takes approximately one hour. There are no restrictions for this procedure.  There is a possibility that an IV may need to be started during your test to inject an image enhancing agent. This is done to obtain more optimal pictures of your heart. Therefore we ask that you do at least drink some water prior to coming in to hydrate your veins.    Follow-Up: At [facility], you and your health needs are our priority.  As part of our continuing mission to provide you with exceptional heart care, we have created designated Provider Care Teams.  These Care Teams include your primary Cardiologist (physician) and Advanced Practice Providers (APPs -  Physician Assistants and Nurse Practitioners) who all work together to provide you with the care you need, when you need it.  You will need a follow up appointment as needed  Providers on your designated Care Team:   Murray Hodgkins, NP Christell Faith, PA-C Cadence Kathlen Mody, [place]  COVID-19 Vaccine Information can be found at: ShippingScam.co.uk For questions related to vaccine distribution or appointments, please email [email or web address] or call [phone].

## 2021-04-14 NOTE — Telephone Encounter (Signed)
Needs echo. Pending [facility] callback. Fu prn with gollan

## 2021-04-17 DIAGNOSIS — M159 Polyosteoarthritis, unspecified: Secondary | ICD-10-CM | POA: Diagnosis not present

## 2021-04-17 DIAGNOSIS — Z79899 Other long term (current) drug therapy: Secondary | ICD-10-CM | POA: Diagnosis not present

## 2021-04-17 DIAGNOSIS — M5431 Sciatica, right side: Secondary | ICD-10-CM | POA: Diagnosis not present

## 2021-04-17 DIAGNOSIS — M9901 Segmental and somatic dysfunction of cervical region: Secondary | ICD-10-CM | POA: Diagnosis not present

## 2021-04-17 DIAGNOSIS — M9903 Segmental and somatic dysfunction of lumbar region: Secondary | ICD-10-CM | POA: Diagnosis not present

## 2021-04-17 DIAGNOSIS — M542 Cervicalgia: Secondary | ICD-10-CM | POA: Diagnosis not present

## 2021-04-17 DIAGNOSIS — M5416 Radiculopathy, lumbar region: Secondary | ICD-10-CM | POA: Diagnosis not present

## 2021-04-17 DIAGNOSIS — R5383 Other fatigue: Secondary | ICD-10-CM | POA: Diagnosis not present

## 2021-04-17 DIAGNOSIS — M5136 Other intervertebral disc degeneration, lumbar region: Secondary | ICD-10-CM | POA: Diagnosis not present

## 2021-04-17 DIAGNOSIS — L4050 Arthropathic psoriasis, unspecified: Secondary | ICD-10-CM | POA: Diagnosis not present

## 2021-04-17 DIAGNOSIS — L409 Psoriasis, unspecified: Secondary | ICD-10-CM | POA: Diagnosis not present

## 2021-04-20 ENCOUNTER — Other Ambulatory Visit: Payer: Self-pay | Admitting: Family Medicine

## 2021-04-20 NOTE — Telephone Encounter (Signed)
Pt called to tell PCP that she is leaving town needs it by tomorrow, almost out of current supply.

## 2021-04-28 ENCOUNTER — Ambulatory Visit
Admission: RE | Admit: 2021-04-28 | Discharge: 2021-04-28 | Disposition: A | Discharge status: Home / Self Care | Payer: Medicare Other | Source: Ambulatory Visit | Attending: Cardiovascular Disease | Admitting: Cardiovascular Disease

## 2021-04-28 ENCOUNTER — Other Ambulatory Visit: Payer: Self-pay

## 2021-04-28 DIAGNOSIS — E119 Type 2 diabetes mellitus without complications: Secondary | ICD-10-CM | POA: Diagnosis not present

## 2021-04-28 DIAGNOSIS — R0609 Other forms of dyspnea: Secondary | ICD-10-CM | POA: Diagnosis not present

## 2021-04-28 DIAGNOSIS — I10 Essential (primary) hypertension: Secondary | ICD-10-CM | POA: Insufficient documentation

## 2021-04-28 DIAGNOSIS — I080 Rheumatic disorders of both mitral and aortic valves: Secondary | ICD-10-CM | POA: Insufficient documentation

## 2021-04-28 DIAGNOSIS — R0600 Dyspnea, unspecified: Secondary | ICD-10-CM | POA: Diagnosis present

## 2021-04-28 LAB — ECHOCARDIOGRAM COMPLETE
AR max vel: 2.03 cm2
AV Area VTI: 2.45 cm2
AV Area mean vel: 2.28 cm2
AV Mean grad: 2 mmHg
AV Peak grad: 4.4 mmHg
Ao pk vel: 1.05 m/s
Area-P 1/2: 2.5 cm2
MV VTI: 2.67 cm2
S' Lateral: 2.7 cm

## 2021-04-28 NOTE — Progress Notes (Signed)
*  PRELIMINARY RESULTS* Echocardiogram 2D Echocardiogram has been performed.  Ashley Rodriguez 04/28/2021, 9:23 AM

## 2021-05-01 NOTE — Progress Notes (Signed)
PROVIDER NOTE: Information contained herein reflects review and annotations entered in association with encounter. Interpretation of such information and data should be left to medically-trained personnel. Information provided to patient can be located elsewhere in the medical record under "Patient Instructions". Document created using STT-dictation technology, any transcriptional errors that may result from process are unintentional.    Patient: Ashley Rodriguez  Service Category: E/M  Provider: Gaspar Cola, MD  DOB: [birth year]  DOS: 05/03/2021  Specialty: Interventional Pain Management  MRN: [id number]  Setting: Ambulatory outpatient  PCP: Virginia Crews, MD  Type: Established Patient    Referring Provider: Virginia Crews, MD  Location: Office  Delivery: Face-to-face     HPI  [name], a 71 y.o. year old female, is here today because of her Chronic bilateral low back pain without sciatica [M54.50, G89.29]. [name] primary complain today is Back Pain (Lower back) Last encounter: My last encounter with her was on Visit date not found. Pertinent problems: [name] has Psoriatic arthritis ([place]); Chronic low back pain (1ry area of Pain) (Bilateral) (R>L) w/ sciatica (Bilateral); Chronic lower extremity pain (2ry area of Pain) (Bilateral) (R>L); Grade 1 Anterolisthesis of L5 over S1; L5 pars defect with spondylolisthesis (Bilateral); DDD (degenerative disc disease), lumbar; Lumbar facet syndrome (Bilateral) (R>L); Chronic pain syndrome; Chronic musculoskeletal pain; Neurogenic pain; Chronic sacroiliac joint pain (Bilateral) (R>L); Osteoarthritis of lumbar spine; Spondylosis without myelopathy or radiculopathy, lumbosacral region; Peripheral neuropathy; Generalized osteoarthritis of hand; Numbness and tingling of foot; Low back pain radiating to both legs; Abnormal EMG (chronic right lower lumbar polyradiculopathy); Chronic lower lumbar polyradiculopathy (Right); DDD (degenerative  disc disease), lumbosacral; Lumbar spondylosis w/ polyradiculopathy (Right); Lumbosacral radiculopathy (Right); Other intervertebral disc degeneration, lumbar region; Spondylolisthesis of lumbosacral region; Other spondylosis, sacral and sacrococcygeal region; Somatic dysfunction of sacroiliac joints (Bilateral); Osteoarthritis of sacroiliac joints (Bilateral); Myalgia due to statin; Chronic sacroiliac joint pain (Right); Osteoarthritis of sacroiliac joint ([place]) (Right); Enthesopathy of sacroiliac joint; Chronic low back pain (Bilateral) w/o sciatica; Chronic sacroiliac joint pain (Left); Osteoarthritis of sacroiliac joint (Left) ([place]); and Somatic dysfunction of sacroiliac joint (Left) on their pertinent problem list. Pain Assessment: Severity of Chronic pain is reported as a 5 /10. Location: Back Right, Left/pain radiaties down her right side. Onset: More than a month ago. Quality: Aching, Constant, Numbness, Pressure. Timing: Constant. Modifying factor(s): repositioning, ice at night. Vitals:  height is $RemoveB'5\' 1"'[id number]$  (1.549 m) and weight is 159 lb (72.1 kg). Her temperature is 97.1 F (36.2 C) (abnormal). Her blood pressure is 133/79 and her pulse is 64. Her oxygen saturation is 98%.   Reason for encounter: evaluation of worsening, or previously known (established) problem.  The patient returns to the clinic today after last having been seen on 05/09/2020, almost 1 year ago, with complaints of low back pain and lower extremity pain.  She indicates that her primary area of pain is that of the lower back (Bilateral) (R>L) with referred pain towards the buttocks area.  On 03/17/2020 she had her first right-sided sacroiliac joint radiofrequency ablation.  On 03/31/2020 she had her first left-sided sacroiliac joint radiofrequency ablation.  On follow-up on 05/09/2020, our last appointment with her, she had indicated that it provided her with 100% relief of the pain for the duration of the local anesthetic followed by  another 100% relief of the pain that lasted for 3 weeks and then it settled down to a 70% improvement.  She continues to enjoy that through the year until recently when  it started coming back.  Today she indicates her primary area of pain to be that of the lower back but she also has been experiencing some referred pain going down both legs with the right being worse than the left.  She describes some tingling and numbing sensation over the bottom of her foot and what seems to be an S1 dermatomal distribution.  However, physical exam today shows her to be positive for ipsilateral low back pain on hyperextension and rotation maneuvers and provocative Kemp maneuver with the right side being worse than the left.  Today the patient was able to toe walk and heel walk without any problems.  Straight leg was negative bilaterally and she was able to reach down to the floor on lumbar flexion testing.  Neither 1 of those reproduce her lower extremity pain suggesting no neural impingement.  However Patrick maneuver was positive bilaterally for sacroiliac joint arthralgia with the right side being worse than the left.  The patient did not experience any pain in the hips but she demonstrated significant decreased range of motion of her left hip when compared to the right.  Based on today's testing, and the amount of time that has passed since the patient's radiofrequency, I believe that she may be experiencing a recurrence of her lumbar facet and sacroiliac joint pain as a consequence of the radiofrequency ablation is wearing off.  Today I have offered to bring the patient back for diagnostic bilateral lumbar facet and SI joint block under fluoroscopic guidance and IV sedation to determine if in fact the radiofrequency ablation has worn off.  If it has, then we will plan on repeating those.  The plan was shared with the patient who understood and accepted.  Pharmacotherapy Assessment  Analgesic: No opioid analgesics from  our practice.   Monitoring: [place] PMP: PDMP reviewed during this encounter.       Pharmacotherapy: No side-effects or adverse reactions reported. Compliance: No problems identified. Effectiveness: Clinically acceptable.  Chauncey Fischer, RN  05/03/2021  1:20 PM  Sign when Signing Visit Safety precautions to be maintained throughout the outpatient stay will include: orient to surroundings, keep bed in low position, maintain call bell within reach at all times, provide assistance with transfer out of bed and ambulation.     UDS:  No results found for: SUMMARY   ROS  Constitutional: Denies any fever or chills Gastrointestinal: No reported hemesis, hematochezia, vomiting, or acute GI distress Musculoskeletal: Denies any acute onset joint swelling, redness, loss of ROM, or weakness Neurological: No reported episodes of acute onset apraxia, aphasia, dysarthria, agnosia, amnesia, paralysis, loss of coordination, or loss of consciousness  Medication Review  Turmeric, acetaminophen, amLODipine, atenolol, betamethasone dipropionate, calcium carbonate, co-enzyme Q-10, esomeprazole, etanercept, ezetimibe, fexofenadine, fluticasone, gabapentin, losartan, meloxicam, montelukast, multivitamin with minerals, and valACYclovir  History Review  Allergy: Ms. Knab is allergic to atorvastatin, codeine, and sulfa antibiotics. Drug: Ms. Gladman  reports no history of drug use. Alcohol:  reports current alcohol use. Tobacco:  reports that she has never smoked. She has never used smokeless tobacco. Social: Ms. Kitt  reports that she has never smoked. She has never used smokeless tobacco. She reports current alcohol use. She reports that she does not use drugs. Medical:  has a past medical history of COVID-19 (12/15/2018), Flank lipoma (08/17/2016), Hypertension, Neck mass (04/07/2013), Psoriatic arthritis ([place]), Recurrent cellulitis, and T2DM (type 2 diabetes mellitus) ([place]) (10/01/2018). Surgical: Ms. Ralphs  has a  past surgical history that includes Abdominal hysterectomy (  2000); lipoma removed (04/2012); Colonoscopy (2015); and Neck mass excision (04/28/14). Family: family history includes Arrhythmia in her nephew; Atrial fibrillation in her brother and mother; Cancer in her father; Hypertension in her mother; Supraventricular tachycardia in her brother.  Laboratory Chemistry Profile   Renal Lab Results  Component Value Date   BUN 10 03/17/2021   CREATININE 0.58 03/17/2021   BCR 17 03/17/2021   GFRAA 94 05/17/2020   GFRNONAA 81 05/17/2020    Hepatic Lab Results  Component Value Date   AST 28 03/17/2021   ALT 26 03/17/2021   ALBUMIN 5.1 (H) 03/17/2021   ALKPHOS 75 03/17/2021    Electrolytes Lab Results  Component Value Date   NA 141 03/17/2021   K 4.8 03/17/2021   CL 100 03/17/2021   CALCIUM 10.0 03/17/2021   MG 2.1 01/03/2017    Bone Lab Results  Component Value Date   25OHVITD1 50 01/03/2017   25OHVITD2 <1.0 01/03/2017   25OHVITD3 50 01/03/2017    Inflammation (CRP: Acute Phase) (ESR: Chronic Phase) Lab Results  Component Value Date   CRP 0.8 01/03/2017   ESRSEDRATE 2 01/25/2020         Note: Above Lab results reviewed.  Recent Imaging Review  ECHOCARDIOGRAM COMPLETE    ECHOCARDIOGRAM REPORT       Patient Name:   AZALIA NEUBERGER Date of Exam: 04/28/2021 Medical Rec #:  [id number]     Height:       61.5 in Accession #:    [id number]    Weight:       151.1 lb Date of Birth:  Jun 05, 1950     BSA:          1.687 m Patient Age:    4 years      BP:           138/74 mmHg Patient Gender: F             HR:           67 bpm. Exam Location:  [facility]  Procedure: 2D Echo, Cardiac Doppler, Color Doppler and Strain Analysis  Indications:     Dyspnea R06.00   History:         Patient has no prior history of Echocardiogram examinations.                  Risk Factors:Hypertension and Diabetes.   Sonographer:     Sherrie Sport Referring Phys:  [id number] Minna Merritts Diagnosing Phys:  Nelva Bush MD    Sonographer Comments: Global longitudinal strain was attempted. IMPRESSIONS   1. Left ventricular ejection fraction, by estimation, is 65 to 70%. The left ventricle has normal function. The left ventricle has no regional wall motion abnormalities. There is mild left ventricular hypertrophy. Left ventricular diastolic parameters  are consistent with Grade I diastolic dysfunction (impaired relaxation). Elevated left atrial pressure. The average left ventricular global longitudinal strain is -18.7 %. The global longitudinal strain is normal.  2. Right ventricular systolic function is normal. The right ventricular size is borderline enlarged. There is normal pulmonary artery systolic pressure.  3. Left atrial size was mildly dilated.  4. The mitral valve is normal in structure. Mild mitral valve regurgitation. No evidence of mitral stenosis.  5. The aortic valve is tricuspid. There is mild calcification of the aortic valve. There is mild thickening of the aortic valve. Aortic valve regurgitation is mild. Aortic valve sclerosis is present, with no evidence of aortic valve stenosis.  FINDINGS  Left Ventricle: Left ventricular ejection fraction, by estimation, is 65 to 70%. The left ventricle has normal function. The left ventricle has no regional wall motion abnormalities. The average left ventricular global longitudinal strain is -18.7 %.  The global longitudinal strain is normal. The left ventricular internal cavity size was normal in size. There is mild left ventricular hypertrophy. Left ventricular diastolic parameters are consistent with Grade I diastolic dysfunction (impaired  relaxation). Elevated left atrial pressure.  Right Ventricle: Pulmonary artery pressure is 20-25 mmHg plus central venous pressure. The right ventricular size is borderline enlarged. No increase in right ventricular wall thickness. Right ventricular systolic function is normal. There is normal   pulmonary artery systolic pressure.  Left Atrium: Left atrial size was mildly dilated.  Right Atrium: Right atrial size was normal in size.  Pericardium: There is no evidence of pericardial effusion.  Mitral Valve: The mitral valve is normal in structure. Mild mitral annular calcification. Mild mitral valve regurgitation. No evidence of mitral valve stenosis. MV peak gradient, 3.8 mmHg. The mean mitral valve gradient is 1.0 mmHg.  Tricuspid Valve: The tricuspid valve is grossly normal. Tricuspid valve regurgitation is trivial.  Aortic Valve: The aortic valve is tricuspid. There is mild calcification of the aortic valve. There is mild thickening of the aortic valve. Aortic valve regurgitation is mild. Aortic valve sclerosis is present, with no evidence of aortic valve stenosis.  Aortic valve mean gradient measures 2.0 mmHg. Aortic valve peak gradient measures 4.4 mmHg. Aortic valve area, by VTI measures 2.45 cm.  Pulmonic Valve: The pulmonic valve was normal in structure. Pulmonic valve regurgitation is trivial. No evidence of pulmonic stenosis.  Aorta: The aortic root is normal in size and structure.  Pulmonary Artery: The pulmonary artery is not well seen.  Venous: The inferior vena cava was not well visualized.  IAS/Shunts: The interatrial septum was not well visualized.    LEFT VENTRICLE PLAX 2D LVIDd:         4.60 cm   Diastology LVIDs:         2.70 cm   LV e' medial:    3.59 cm/s LV PW:         0.90 cm   LV E/e' medial:  16.4 LV IVS:        0.80 cm   LV e' lateral:   5.55 cm/s LVOT diam:     2.00 cm   LV E/e' lateral: 10.6 LV SV:         57 LV SV Index:   34        2D Longitudinal Strain LVOT Area:     3.14 cm  2D Strain GLS Avg:     -18.7 %                            3D Volume EF:                          3D EF:        65 %                          LV EDV:       121 ml                          LV ESV:       42 ml  LV SV:        79 ml  RIGHT  VENTRICLE RV Basal diam:  4.18 cm RV S prime:     12.70 cm/s TAPSE (M-mode): 2.8 cm  LEFT ATRIUM           Index        RIGHT ATRIUM           Index LA diam:      4.30 cm 2.55 cm/m   RA Area:     13.20 cm LA Vol (A2C): 46.5 ml 27.57 ml/m  RA Volume:   29.40 ml  17.43 ml/m LA Vol (A4C): 27.5 ml 16.30 ml/m  AORTIC VALVE                    PULMONIC VALVE AV Area (Vmax):    2.03 cm     PV Vmax:        0.63 m/s AV Area (Vmean):   2.28 cm     PV Vmean:       42.800 cm/s AV Area (VTI):     2.45 cm     PV VTI:         0.135 m AV Vmax:           105.00 cm/s  PV Peak grad:   1.6 mmHg AV Vmean:          71.400 cm/s  PV Mean grad:   1.0 mmHg AV VTI:            0.233 m      RVOT Peak grad: 4 mmHg AV Peak Grad:      4.4 mmHg AV Mean Grad:      2.0 mmHg LVOT Vmax:         67.90 cm/s LVOT Vmean:        51.900 cm/s LVOT VTI:          0.182 m LVOT/AV VTI ratio: 0.78   AORTA Ao Root diam: 2.87 cm  MITRAL VALVE               TRICUSPID VALVE MV Area (PHT): 2.50 cm    TR Peak grad:   22.3 mmHg MV Area VTI:   2.67 cm    TR Vmax:        236.00 cm/s MV Peak grad:  3.8 mmHg MV Mean grad:  1.0 mmHg    SHUNTS MV Vmax:       0.98 m/s    Systemic VTI:  0.18 m MV Vmean:      48.6 cm/s   Systemic Diam: 2.00 cm MV Decel Time: 303 msec    Pulmonic VTI:  0.227 m MV E velocity: 58.70 cm/s MV A velocity: 92.10 cm/s MV E/A ratio:  0.64  Christopher End MD Electronically signed by Yvonne Kendall MD Signature Date/Time: 04/28/2021/11:47:08 AM      Final   Note: Reviewed        Physical Exam  General appearance: Well nourished, well developed, and well hydrated. In no apparent acute distress Mental status: Alert, oriented x 3 (person, place, & time)       Respiratory: No evidence of acute respiratory distress Eyes: PERLA Vitals: BP 133/79    Pulse 64    Temp (!) 97.1 F (36.2 C)    Ht 5\' 1"  (1.549 m)    Wt 159 lb (72.1 kg)    SpO2 98%    BMI 30.04 kg/m  BMI: Estimated body mass index is 30.04  kg/m as calculated from the following:   Height as of this encounter: $RemoveBeforeD'5\' 1"'[id number]$  (1.549 m).   Weight as of this encounter: 159 lb (72.1 kg). Ideal: Ideal body weight: 47.8 kg (105 lb 6.1 oz) Adjusted ideal body weight: 57.5 kg (126 lb 13.2 oz)  Assessment   Status Diagnosis  Controlled Controlled Controlled 1. Chronic low back pain (Bilateral) w/o sciatica   2. Lumbar facet syndrome (Bilateral) (R>L)   3. L5 pars defect with spondylolisthesis (Bilateral)   4. Grade 1 Anterolisthesis of L5 over S1   5. Spondylosis without myelopathy or radiculopathy, lumbosacral region   6. Chronic sacroiliac joint pain (Bilateral) (R>L)   7. Osteoarthritis of sacroiliac joints (Bilateral)   8. Other spondylosis, sacral and sacrococcygeal region      Updated Problems: Problem  Acute Postoperative Pain (Resolved)    Plan of Care  Problem-specific:  No problem-specific Assessment & Plan notes found for this encounter.  Ms. Sherlonda Flater has a current medication list which includes the following long-term medication(s): amlodipine, atenolol, calcium carbonate, enbrel sureclick, esomeprazole, ezetimibe, fexofenadine, fluticasone, gabapentin, losartan, and montelukast.  Pharmacotherapy (Medications Ordered): No orders of the defined types were placed in this encounter.  Orders:  Orders Placed This Encounter  Procedures   LUMBAR FACET(MEDIAL BRANCH NERVE BLOCK) MBNB    Standing Status:   Future    Standing Expiration Date:   08/01/2021    Scheduling Instructions:     Procedure: Lumbar facet block (AKA.: Lumbosacral medial branch nerve block)     Side: Bilateral     Level: L3-4, L4-5, & L5-S1 Facets (L2, L3, L4, L5, & S1 Medial Branch Nerves)     Sedation: Patient's choice.     Timeframe: ASAA    Order Specific Question:   Where will this procedure be performed?    Answer:   [facility]   SACROILIAC JOINT INJECTION    Standing Status:   Future    Standing Expiration Date:   08/01/2021     Scheduling Instructions:     Side: Bilateral     Sedation: Patient's choice.     Timeframe: ASAP    Order Specific Question:   Where will this procedure be performed?    Answer:   [facility]   Follow-up plan:   Return for (Clinic) procedure: (B) L-FCT + (B) SI BLK, (Sed-anx).     Interventional Therapies  Risk   Complexity Considerations:   WNL   Planned   Pending:   Diagnostic bilateral lumbar facet and SI joint block to determine if the prior radiofrequency's have worn off.   Under consideration:   Diagnostic bilateral hip joint injection  Possible bilateral femoral + obturator NB  Diagnostic right L4-5 LESI #2    Completed:   Diagnostic/therapeutic right L4-5 LESI x1 (01/22/2019) (50/50/90 x2 weeks)  Diagnostic bilateral SI joint block x2 (02/09/2020) (100/100/80) (100/100/100/50)  Therapeutic right SI RFA x1 (03/17/2020) (100/100/100/90-100)  Therapeutic left SI RFA x1 (03/31/2020) (100/100/100 x3 weeks/70)  Diagnostic bilateral lumbar facet MBB x2 (01/22/2017; 04/18/2017) (100/100/90/90) (100/90/100/100)  Palliative bilateral lumbar facet MBB x3 (10/09/2018) (100/100/95/90-100/duration of up to 8 months)  Therapeutic right lumbar facet RFA x1 (07/21/2019) (100/100/75)  Therapeutic left lumbar facet RFA x1 (08/06/2019)  (100/100/75)    Palliative options:   Palliative lumbar facet MBB  Therapeutic lumbar facet RFA  Therapeutic SI RFA  Palliative SI joint Blk  Therapeutic L4-5 LESI     Recent Visits No visits were found meeting these conditions. Showing  recent visits within past 90 days and meeting all other requirements Today's Visits Date Type Provider Dept  05/03/21 Office Visit Milinda Pointer, MD [facility]  Showing today's visits and meeting all other requirements Future Appointments No visits were found meeting these conditions. Showing future appointments within next 90 days and meeting all other requirements  I discussed the assessment  and treatment plan with the patient. The patient was provided an opportunity to ask questions and all were answered. The patient agreed with the plan and demonstrated an understanding of the instructions.  Patient advised to call back or seek an in-person evaluation if the symptoms or condition worsens.  Duration of encounter: 35 minutes.  Note by: Gaspar Cola, MD Date: 05/03/2021; Time: 2:07 PM

## 2021-05-02 DIAGNOSIS — M5416 Radiculopathy, lumbar region: Secondary | ICD-10-CM | POA: Diagnosis not present

## 2021-05-02 DIAGNOSIS — M542 Cervicalgia: Secondary | ICD-10-CM | POA: Diagnosis not present

## 2021-05-02 DIAGNOSIS — M9903 Segmental and somatic dysfunction of lumbar region: Secondary | ICD-10-CM | POA: Diagnosis not present

## 2021-05-02 DIAGNOSIS — M9901 Segmental and somatic dysfunction of cervical region: Secondary | ICD-10-CM | POA: Diagnosis not present

## 2021-05-03 ENCOUNTER — Ambulatory Visit: Payer: Medicare Other | Attending: Pain Medicine | Admitting: Pain Medicine

## 2021-05-03 ENCOUNTER — Other Ambulatory Visit: Payer: Self-pay

## 2021-05-03 ENCOUNTER — Encounter: Payer: Self-pay | Admitting: Pain Medicine

## 2021-05-03 VITALS — BP 133/79 | HR 64 | Temp 97.1°F | Ht 61.0 in | Wt 159.0 lb

## 2021-05-03 DIAGNOSIS — M47816 Spondylosis without myelopathy or radiculopathy, lumbar region: Secondary | ICD-10-CM | POA: Insufficient documentation

## 2021-05-03 DIAGNOSIS — M545 Low back pain, unspecified: Secondary | ICD-10-CM | POA: Insufficient documentation

## 2021-05-03 DIAGNOSIS — G8929 Other chronic pain: Secondary | ICD-10-CM | POA: Insufficient documentation

## 2021-05-03 DIAGNOSIS — M533 Sacrococcygeal disorders, not elsewhere classified: Secondary | ICD-10-CM | POA: Insufficient documentation

## 2021-05-03 DIAGNOSIS — M461 Sacroiliitis, not elsewhere classified: Secondary | ICD-10-CM | POA: Diagnosis not present

## 2021-05-03 DIAGNOSIS — M47898 Other spondylosis, sacral and sacrococcygeal region: Secondary | ICD-10-CM | POA: Diagnosis not present

## 2021-05-03 DIAGNOSIS — M4300 Spondylolysis, site unspecified: Secondary | ICD-10-CM | POA: Insufficient documentation

## 2021-05-03 DIAGNOSIS — M47817 Spondylosis without myelopathy or radiculopathy, lumbosacral region: Secondary | ICD-10-CM | POA: Diagnosis not present

## 2021-05-03 DIAGNOSIS — M4310 Spondylolisthesis, site unspecified: Secondary | ICD-10-CM | POA: Insufficient documentation

## 2021-05-03 NOTE — Patient Instructions (Addendum)
______________________________________________________________________ ° °Preparing for Procedure with Sedation ° °NOTICE: Due to recent regulatory changes, starting on November 28, 2020, procedures requiring intravenous (IV) sedation will no longer be performed at the [facility].  These types of procedures are required to be performed at [facility] ambulatory surgery facility.  We are very sorry for the inconvenience. ° °Procedure appointments are limited to planned procedures: °No Prescription Refills. °No disability issues will be discussed. °No medication changes will be discussed. ° °Instructions: °Oral Intake: Do not eat or drink anything for at least 8 hours prior to your procedure. (Exception: Blood Pressure Medication. See below.) °Transportation: A driver is required. You may not drive yourself after the procedure. °Blood Pressure Medicine: Do not forget to take your blood pressure medicine with a sip of water the morning of the procedure. If your Diastolic (lower reading) is above 100 mmHg, elective cases will be cancelled/rescheduled. °Blood thinners: These will need to be stopped for procedures. Notify our staff if you are taking any blood thinners. Depending on which one you take, there will be specific instructions on how and when to stop it. °Diabetics on insulin: Notify the staff so that you can be scheduled 1st case in the morning. If your diabetes requires high dose insulin, take only ½ of your normal insulin dose the morning of the procedure and notify the staff that you have done so. °Preventing infections: Shower with an antibacterial soap the morning of your procedure. °Build-up your immune system: Take 1000 mg of Vitamin C with every meal (3 times a day) the day prior to your procedure. °Antibiotics: Inform the staff if you have a condition or reason that requires you to take antibiotics before dental procedures. °Pregnancy: If you are pregnant, call and cancel the procedure. °Sickness: If  you have a cold, fever, or any active infections, call and cancel the procedure. °Arrival: You must be in the facility at least 30 minutes prior to your scheduled procedure. °Children: Do not bring children with you. °Dress appropriately: Bring dark clothing that you would not mind if they get stained. °Valuables: Do not bring any jewelry or valuables. ° °Reasons to call and reschedule or cancel your procedure: (Following these recommendations will minimize the risk of a serious complication.) °Surgeries: Avoid having procedures within 2 weeks of any surgery. (Avoid for 2 weeks before or after any surgery). °Flu Shots: Avoid having procedures within 2 weeks of a flu shots. (Avoid for 2 weeks before or after immunizations). °Barium: Avoid having a procedure within 7-10 days after having had a radiological study involving the use of radiological contrast. (Myelograms, Barium swallow or enema study). °Heart attacks: Avoid any elective procedures or surgeries for the initial 6 months after a "Myocardial Infarction" (Heart Attack). °Blood thinners: It is imperative that you stop these medications before procedures. Let us know if you if you take any blood thinner.  °Infection: Avoid procedures during or within two weeks of an infection (including chest colds or gastrointestinal problems). Symptoms associated with infections include: Localized redness, fever, chills, night sweats or profuse sweating, burning sensation when voiding, cough, congestion, stuffiness, runny nose, sore throat, diarrhea, nausea, vomiting, cold or Flu symptoms, recent or current infections. It is specially important if the infection is over the area that we intend to treat. °Heart and lung problems: Symptoms that may suggest an active cardiopulmonary problem include: cough, chest pain, breathing difficulties or shortness of breath, dizziness, ankle swelling, uncontrolled high or unusually low blood pressure, and/or palpitations. If you are    experiencing any of these symptoms, cancel your procedure and contact your primary care physician for an evaluation. ° °Remember:  °Regular Business hours are:  °Monday to Thursday 8:00 AM to 4:00 PM ° °Provider's Schedule: °Francisco Naveira, MD:  °Procedure days: Tuesday and Thursday 7:30 AM to 4:00 PM ° °Bilal Lateef, MD:  °Procedure days: Monday and Wednesday 7:30 AM to 4:00 PM °______________________________________________________________________ ° ____________________________________________________________________________________________ ° °General Risks and Possible Complications ° °Patient Responsibilities: It is important that you read this as it is part of your informed consent. It is our duty to inform you of the risks and possible complications associated with treatments offered to you. It is your responsibility as a patient to read this and to ask questions about anything that is not clear or that you believe was not covered in this document. ° °Patient’s Rights: You have the right to refuse treatment. You also have the right to change your mind, even after initially having agreed to have the treatment done. However, under this last option, if you wait until the last second to change your mind, you may be charged for the materials used up to that point. ° °Introduction: Medicine is not an exact science. Everything in Medicine, including the lack of treatment(s), carries the potential for danger, harm, or loss (which is by definition: Risk). In Medicine, a complication is a secondary problem, condition, or disease that can aggravate an already existing one. All treatments carry the risk of possible complications. The fact that a side effects or complications occurs, does not imply that the treatment was conducted incorrectly. It must be clearly understood that these can happen even when everything is done following the highest safety standards. ° °No treatment: You can choose not to proceed with the  proposed treatment alternative. The “PRO(s)” would include: avoiding the risk of complications associated with the therapy. The “CON(s)” would include: not getting any of the treatment benefits. These benefits fall under one of three categories: diagnostic; therapeutic; and/or palliative. Diagnostic benefits include: getting information which can ultimately lead to improvement of the disease or symptom(s). Therapeutic benefits are those associated with the successful treatment of the disease. Finally, palliative benefits are those related to the decrease of the primary symptoms, without necessarily curing the condition (example: decreasing the pain from a flare-up of a chronic condition, such as incurable terminal cancer). ° °General Risks and Complications: These are associated to most interventional treatments. They can occur alone, or in combination. They fall under one of the following six (6) categories: no benefit or worsening of symptoms; bleeding; infection; nerve damage; allergic reactions; and/or death. °No benefits or worsening of symptoms: In Medicine there are no guarantees, only probabilities. No healthcare provider can ever guarantee that a medical treatment will work, they can only state the probability that it may. Furthermore, there is always the possibility that the condition may worsen, either directly, or indirectly, as a consequence of the treatment. °Bleeding: This is more common if the patient is taking a blood thinner, either prescription or over the counter (example: Goody Powders, Fish oil, Aspirin, Garlic, etc.), or if suffering a condition associated with impaired coagulation (example: Hemophilia, cirrhosis of the liver, low platelet counts, etc.). However, even if you do not have one on these, it can still happen. If you have any of these conditions, or take one of these drugs, make sure to notify your treating physician. °Infection: This is more common in patients with a compromised  immune system, either due to disease (example:   diabetes, cancer, human immunodeficiency virus [HIV], etc.), or due to medications or treatments (example: therapies used to treat cancer and rheumatological diseases). However, even if you do not have one on these, it can still happen. If you have any of these conditions, or take one of these drugs, make sure to notify your treating physician. Nerve Damage: This is more common when the treatment is an invasive one, but it can also happen with the use of medications, such as those used in the treatment of cancer. The damage can occur to small secondary nerves, or to large primary ones, such as those in the spinal cord and brain. This damage may be temporary or permanent and it may lead to impairments that can range from temporary numbness to permanent paralysis and/or brain death. Allergic Reactions: Any time a substance or material comes in contact with our body, there is the possibility of an allergic reaction. These can range from a mild skin rash (contact dermatitis) to a severe systemic reaction (anaphylactic reaction), which can result in death. Death: In general, any medical intervention can result in death, most of the time due to an unforeseen complication. ____________________________________________________________________________________________ Sacroiliac (SI) Joint Injection Patient Information  Description: The sacroiliac joint connects the scrum (very low back and tailbone) to the ilium (a pelvic bone which also forms half of the hip joint).  Normally this joint experiences very little motion.  When this joint becomes inflamed or unstable low back and or hip and pelvis pain may result.  Injection of this joint with local anesthetics (numbing medicines) and steroids can provide diagnostic information and reduce pain.  This injection is performed with the aid of x-ray guidance into the tailbone area while you are lying on your stomach.   You may  experience an electrical sensation down the leg while this is being done.  You may also experience numbness.  We also may ask if we are reproducing your normal pain during the injection.  Conditions which may be treated SI injection:  Low back, buttock, hip or leg pain  Preparation for the Injection:  Do not eat any solid food or dairy products within 8 hours of your appointment.  You may drink clear liquids up to 3 hours before appointment.  Clear liquids include water, black coffee, juice or soda.  No milk or cream please. You may take your regular medications, including pain medications with a sip of water before your appointment.  Diabetics should hold regular insulin (if take separately) and take 1/2 normal NPH dose the morning of the procedure.  Carry some sugar containing items with you to your appointment. A driver must accompany you and be prepared to drive you home after your procedure. Bring all of your current medications with you. An IV may be inserted and sedation may be given at the discretion of the physician. A blood pressure cuff, EKG and other monitors will often be applied during the procedure.  Some patients may need to have extra oxygen administered for a short period.  You will be asked to provide medical information, including your allergies, prior to the procedure.  We must know immediately if you are taking blood thinners (like Coumadin/Warfarin) or if you are allergic to IV iodine contrast (dye).  We must know if you could possible be pregnant.  Possible side effects:  Bleeding from needle site Infection (rare, may require surgery) Nerve injury (rare) Numbness & tingling (temporary) A brief convulsion or seizure Light-headedness (temporary) Pain at injection site (several  days) Decreased blood pressure (temporary) Weakness in the leg (temporary)   Call if you experience:  New onset weakness or numbness of an extremity below the injection site that last more  than 8 hours. Hives or difficulty breathing ( go to the emergency room) Inflammation or drainage at the injection site Any new symptoms which are concerning to you  Please note:  Although the local anesthetic injected can often make your back/ hip/ buttock/ leg feel good for several hours after the injections, the pain will likely return.  It takes 3-7 days for steroids to work in the sacroiliac area.  You may not notice any pain relief for at least that one week.  If effective, we will often do a series of three injections spaced 3-6 weeks apart to maximally decrease your pain.  After the initial series, we generally will wait some months before a repeat injection of the same type.  If you have any questions, please call [phone] [facility]

## 2021-05-03 NOTE — Progress Notes (Signed)
Safety precautions to be maintained throughout the outpatient stay will include: orient to surroundings, keep bed in low position, maintain call bell within reach at all times, provide assistance with transfer out of bed and ambulation.  

## 2021-05-08 ENCOUNTER — Other Ambulatory Visit: Payer: Self-pay | Admitting: Family Medicine

## 2021-05-08 ENCOUNTER — Telehealth: Payer: Self-pay

## 2021-05-08 NOTE — Telephone Encounter (Signed)
Left detail message on VM of pt's recent results okay by DPR, Dr. Rockey Situ advised   "Echocardiogram  Normal LV and RV function  No significant valvular heart disease  Normal heart pressures "  At this time, no further recommendations or medications changes, advised to call office for any concerns or questions, otherwise will see at next visit.   Results released to MyChart by Dr. Rockey Situ

## 2021-05-10 ENCOUNTER — Ambulatory Visit: Payer: Medicare Other | Admitting: Podiatry

## 2021-05-10 ENCOUNTER — Encounter: Payer: Self-pay | Admitting: Podiatry

## 2021-05-10 ENCOUNTER — Other Ambulatory Visit: Payer: Self-pay

## 2021-05-10 DIAGNOSIS — M778 Other enthesopathies, not elsewhere classified: Secondary | ICD-10-CM

## 2021-05-10 DIAGNOSIS — M7741 Metatarsalgia, right foot: Secondary | ICD-10-CM

## 2021-05-10 MED ORDER — TRIAMCINOLONE ACETONIDE 40 MG/ML IJ SUSP
20.0000 mg | Freq: Once | INTRAMUSCULAR | Status: AC
  Administered 2021-05-10: 20 mg

## 2021-05-10 NOTE — Progress Notes (Signed)
She presents today for follow-up of her metatarsalgia right states that it seems better but has had more pain in the evenings rather than at night not been using the hemp cream and Voltaren gel in the evenings.  Objective: Vital signs stable oriented x3.  Pulses are palpable.  Plantarflexed second and third metatarsal secondary to rigid hammertoe deformities right foot.  Pain on palpation second and third metatarsal phalangeal joints with soft tissue swelling plantar forefoot.  Assessment: Capsulitis hammertoe deformities.  Plan: Currently I injected 10 mg of Kenalog between the second and third metatarsal phalangeal joints.  I will follow-up with her on an as-needed basis.

## 2021-05-15 DIAGNOSIS — M9903 Segmental and somatic dysfunction of lumbar region: Secondary | ICD-10-CM | POA: Diagnosis not present

## 2021-05-15 DIAGNOSIS — M5416 Radiculopathy, lumbar region: Secondary | ICD-10-CM | POA: Diagnosis not present

## 2021-05-15 DIAGNOSIS — M542 Cervicalgia: Secondary | ICD-10-CM | POA: Diagnosis not present

## 2021-05-15 DIAGNOSIS — M9901 Segmental and somatic dysfunction of cervical region: Secondary | ICD-10-CM | POA: Diagnosis not present

## 2021-05-17 NOTE — Progress Notes (Signed)
PROVIDER NOTE: Interpretation of information contained herein should be left to medically-trained personnel. Specific patient instructions are provided elsewhere under "Patient Instructions" section of medical record. This document was created in part using STT-dictation technology, any transcriptional errors that may result from this process are unintentional.  Patient: Ashley Rodriguez Type: Established DOB: 19-Jun-1950 MRN: [id number] PCP: Ashley Crews, MD  Service: Procedure DOS: 05/18/2021 Setting: Ambulatory Location: Ambulatory outpatient facility Delivery: Face-to-face Provider: Gaspar Cola, MD Specialty: Interventional Pain Management Specialty designation: 09 Location: Outpatient facility Ref. Prov.: Ashley Pointer, MD    Primary Reason for Visit: Interventional Pain Management Treatment. CC: Back Pain (low)    Procedure:          Anesthesia, Analgesia, Anxiolysis:  Procedure #1: Type: Medial Branch Facet Block #5 Primary Purpose: Diagnostic Region: Lumbar Level: L2, L3, L4, L5, & S1 Medial Branch Level(s) Target Area: For Lumbar Facet blocks, the target is the groove formed by the junction of the transverse process and superior articular process. For the L5 dorsal ramus, the target is the notch between superior articular process and sacral ala. For the S1 dorsal ramus, the target is the superior and lateral edge of the posterior S1 Sacral foramen. Approach: Posterior, paramedial, percutaneous approach. Laterality: Bilateral  Procedure #2: Type: Sacroiliac Joint Block  #3  Primary Purpose: Diagnostic Region: Posterior Lumbosacral Level: PSIS (Posterior Superior Iliac Spine) Sacroiliac Joint Target Area: For upper sacroiliac joint block(s), the target is the superior and posterior margin of the sacroiliac joint. Approach: Ipsilateral approach. Laterality: Bilateral  Anesthesia: Local (1-2% Lidocaine)  Anxiolysis: None  Sedation: None  Guidance: Fluoroscopy            Position: Prone   1. Lumbar facet syndrome (Bilateral) (R>L)   2. Spondylosis without myelopathy or radiculopathy, lumbosacral region   3. Grade 1 Anterolisthesis of L5 over S1   4. L5 pars defect with spondylolisthesis (Bilateral)   5. DDD (degenerative disc disease), lumbosacral   6. Chronic low back pain (Bilateral) w/o sciatica   7. Chronic sacroiliac joint pain (Bilateral) (R>L)   8. Other spondylosis, sacral and sacrococcygeal region   9. Enthesopathy of sacroiliac joint   10. Osteoarthritis of sacroiliac joints (Bilateral)   11. Somatic dysfunction of sacroiliac joints (Bilateral)    NAS-11 Pain score:   Pre-procedure: 3 /10   Post-procedure: 0-No pain/10     Pre-op H&P Assessment:  Ashley Rodriguez is a 71 y.o. (year old), female patient, seen today for interventional treatment. She  has a past surgical history that includes Abdominal hysterectomy (2000); lipoma removed (04/2012); Colonoscopy (2015); and Neck mass excision (04/28/14). Ashley Rodriguez has a current medication list which includes the following prescription(s): acetaminophen, amlodipine, atenolol, betamethasone dipropionate, calcium carbonate, co-enzyme G-92, enbrel sureclick, esomeprazole, ezetimibe, fexofenadine, fluad quadrivalent, fluticasone, gabapentin, losartan, meloxicam, montelukast, multivitamin with minerals, turmeric, and valacyclovir, and the following Facility-Administered Medications: pentafluoroprop-tetrafluoroeth and triamcinolone acetonide. Her primarily concern today is the Back Pain (low)  Initial Vital Signs:  Pulse/HCG Rate: (!) 55ECG Heart Rate: (!) 59 Temp: (!) 97.1 F (36.2 C) Resp: 16 BP: (!) 184/78 SpO2: 100 %  BMI: Estimated body mass index is 27.96 kg/m as calculated from the following:   Height as of this encounter: 5\' 1"  (1.549 m).   Weight as of this encounter: 148 lb (67.1 kg).  Risk Assessment: Allergies: Reviewed. She is allergic to atorvastatin,  sulfamethoxazole-trimethoprim, codeine, and sulfa antibiotics.  Allergy Precautions: None required Coagulopathies: Reviewed. None identified.  Blood-thinner therapy: None at this time  Active Infection(s): Reviewed. None identified. Ashley Rodriguez is afebrile  Site Confirmation: Ashley Rodriguez was asked to confirm the procedure and laterality before marking the site Procedure checklist: Completed Consent: Before the procedure and under the influence of no sedative(s), amnesic(s), or anxiolytics, the patient was informed of the treatment options, risks and possible complications. To fulfill our ethical and legal obligations, as recommended by the American Medical Association's Code of Ethics, I have informed the patient of my clinical impression; the nature and purpose of the treatment or procedure; the risks, benefits, and possible complications of the intervention; the alternatives, including doing nothing; the risk(s) and benefit(s) of the alternative treatment(s) or procedure(s); and the risk(s) and benefit(s) of doing nothing. The patient was provided information about the general risks and possible complications associated with the procedure. These may include, but are not limited to: failure to achieve desired goals, infection, bleeding, organ or nerve damage, allergic reactions, paralysis, and death. In addition, the patient was informed of those risks and complications associated to Spine-related procedures, such as failure to decrease pain; infection (i.e.: Meningitis, epidural or intraspinal abscess); bleeding (i.e.: epidural hematoma, subarachnoid hemorrhage, or any other type of intraspinal or peri-dural bleeding); organ or nerve damage (i.e.: Any type of peripheral nerve, nerve root, or spinal cord injury) with subsequent damage to sensory, motor, and/or autonomic systems, resulting in permanent pain, numbness, and/or weakness of one or several areas of the body; allergic reactions; (i.e.: anaphylactic  reaction); and/or death. Furthermore, the patient was informed of those risks and complications associated with the medications. These include, but are not limited to: allergic reactions (i.e.: anaphylactic or anaphylactoid reaction(s)); adrenal axis suppression; blood sugar elevation that in diabetics may result in ketoacidosis or comma; water retention that in patients with history of congestive heart failure may result in shortness of breath, pulmonary edema, and decompensation with resultant heart failure; weight gain; swelling or edema; medication-induced neural toxicity; particulate matter embolism and blood vessel occlusion with resultant organ, and/or nervous system infarction; and/or aseptic necrosis of one or more joints. Finally, the patient was informed that Medicine is not an exact science; therefore, there is also the possibility of unforeseen or unpredictable risks and/or possible complications that may result in a catastrophic outcome. The patient indicated having understood very clearly. We have given the patient no guarantees and we have made no promises. Enough time was given to the patient to ask questions, all of which were answered to the patient's satisfaction. Ms. Junio has indicated that she wanted to continue with the procedure. Attestation: I, the ordering provider, attest that I have discussed with the patient the benefits, risks, side-effects, alternatives, likelihood of achieving goals, and potential problems during recovery for the procedure that I have provided informed consent. Date   Time: 05/18/2021 10:19 AM  Pre-Procedure Preparation:  Monitoring: As per clinic protocol. Respiration, ETCO2, SpO2, BP, heart rate and rhythm monitor placed and checked for adequate function Safety Precautions: Patient was assessed for positional comfort and pressure points before starting the procedure. Time-out: I initiated and conducted the "Time-out" before starting the procedure, as per  protocol. The patient was asked to participate by confirming the accuracy of the "Time Out" information. Verification of the correct person, site, and procedure were performed and confirmed by me, the nursing staff, and the patient. "Time-out" conducted as per Joint Commission's Universal Protocol (UP.01.01.01). Time: 1111  Description of Procedure #1:   Time-out: "Time-out" completed before starting procedure, as per protocol. Area Prepped: Entire Posterior Lumbosacral Region  DuraPrep (Iodine Povacrylex [0.7% available iodine] and Isopropyl Alcohol, 74% w/w) Safety Precautions: Aspiration looking for blood return was conducted prior to all injections. At no point did we inject any substances, as a needle was being advanced. No attempts were made at seeking any paresthesias. Safe injection practices and needle disposal techniques used. Medications properly checked for expiration dates. SDV (single dose vial) medications used.  Description of the Procedure: Protocol guidelines were followed. The patient was placed in position over the fluoroscopy table. The target area was identified and the area prepped in the usual manner. Skin & deeper tissues infiltrated with local anesthetic. Appropriate amount of time allowed to pass for local anesthetics to take effect. The procedure needle was introduced through the skin, ipsilateral to the reported pain, and advanced to the target area. Employing the Medial Branch Technique, the needles were advanced to the angle made by the superior and medial portion of the transverse process, and the lateral and inferior portion of the superior articulating process of the targeted vertebral bodies. This area is known as [employer] or the [place] of the [employer]. A procedure needle was introduced through the skin, and this time advanced to the angle made by the superior and medial border of the sacral ala, and the lateral border of the S1 vertebral body. This last needle  was later repositioned at the superior and lateral border of the posterior S1 foramen. Negative aspiration confirmed. Solution injected in intermittent fashion, asking for systemic symptoms every 0.5cc of injectate. The needles were then removed and the area cleansed, making sure to leave some of the prepping solution back to take advantage of its long term bactericidal properties. Start Time: 1111 hrs. Materials:  Needle(s) Type: Spinal Needle Gauge: 22G Length: 3.5-in Medication(s): Please see orders for medications and dosing details.  Description of Procedure # 2:   Area Prepped: Entire Posterior Lumbosacral Region DuraPrep (Iodine Povacrylex [0.7% available iodine] and Isopropyl Alcohol, 74% w/w) Safety Precautions: Aspiration looking for blood return was conducted prior to all injections. At no point did we inject any substances, as a needle was being advanced. No attempts were made at seeking any paresthesias. Safe injection practices and needle disposal techniques used. Medications properly checked for expiration dates. SDV (single dose vial) medications used. Description of the Procedure: Protocol guidelines were followed. The patient was placed in position over the fluoroscopy table. The target area was identified and the area prepped in the usual manner. Skin & deeper tissues infiltrated with local anesthetic. Appropriate amount of time allowed to pass for local anesthetics to take effect. The procedure needle was advanced under fluoroscopic guidance into the sacroiliac joint until a firm endpoint was obtained. Proper needle placement secured. Negative aspiration confirmed. Solution injected in intermittent fashion, asking for systemic symptoms every 0.5cc of injectate. The needles were then removed and the area cleansed, making sure to leave some of the prepping solution back to take advantage of its long term bactericidal properties. Vitals:   05/18/21 1117 05/18/21 1122 05/18/21 1124  05/18/21 1130  BP: (!) 147/96 (!) 154/83 (!) 157/84 (!) 157/97  Pulse:    64  Resp: 18 18 16 16   Temp:    (!) 97.3 F (36.3 C)  TempSrc:    Temporal  SpO2: 97% 95% 96% 100%  Weight:      Height:        End Time: 1124 hrs. Materials:  Needle(s) Type: Spinal Needle Gauge: 22G Length: 5.0-in Medication(s): Please see orders for medications  and dosing details.  Imaging Guidance (Spinal):          Type of Imaging Technique: Fluoroscopy Guidance (Spinal) Indication(s): Assistance in needle guidance and placement for procedures requiring needle placement in or near specific anatomical locations not easily accessible without such assistance. Exposure Time: Please see nurses notes. Contrast: None used. Fluoroscopic Guidance: I was personally present during the use of fluoroscopy. "Tunnel Vision Technique" used to obtain the best possible view of the target area. Parallax error corrected before commencing the procedure. "Direction-depth-direction" technique used to introduce the needle under continuous pulsed fluoroscopy. Once target was reached, antero-posterior, oblique, and lateral fluoroscopic projection used confirm needle placement in all planes. Images permanently stored in EMR. Interpretation: No contrast injected. I personally interpreted the imaging intraoperatively. Adequate needle placement confirmed in multiple planes. Permanent images saved into the patient's record.  Antibiotic Prophylaxis:   Anti-infectives (From admission, onward)    None      Indication(s): None identified  Post-operative Assessment:  Post-procedure Vital Signs:  Pulse/HCG Rate: 64(!) 58 Temp: (!) 97.3 F (36.3 C) Resp: 16 BP: (!) 157/97 SpO2: 100 %  EBL: None  Complications: No immediate post-treatment complications observed by team, or reported by patient.  Note: The patient tolerated the entire procedure well. A repeat set of vitals were taken after the procedure and the patient was kept  under observation following institutional policy, for this type of procedure. Post-procedural neurological assessment was performed, showing return to baseline, prior to discharge. The patient was provided with post-procedure discharge instructions, including a section on how to identify potential problems. Should any problems arise concerning this procedure, the patient was given instructions to immediately contact us, at any time, without hesitation. In any case, we plan to contact the patient by telephone for a follow-up status report regarding this interventional procedure.  Comments:  No additional relevant information.  Plan of Care  Orders:  Orders Placed This Encounter  Procedures   LUMBAR FACET(MEDIAL BRANCH NERVE BLOCK) MBNB    Scheduling Instructions:     Procedure: Lumbar facet block (AKA.: Lumbosacral medial branch nerve block)     Side: Bilateral     Level: L3-4 & L5-S1 Facets (L2, L3, L4, L5, & S1 Medial Branch Nerves)     Sedation: Patient's choice.     Timeframe: Today    Order Specific Question:   Where will this procedure be performed?    Answer:   [facility]   SACROILIAC JOINT INJECTION    Scheduling Instructions:     Side: Bilateral     Sedation: Patient's choice.     Timeframe: Today    Order Specific Question:   Where will this procedure be performed?    Answer:   [facility]   DG PAIN [facility] C-ARM 1-60 MIN NO REPORT    Intraoperative interpretation by procedural physician at [facility].    Standing Status:   Standing    Number of Occurrences:   1    Order Specific Question:   Reason for exam:    Answer:   Assistance in needle guidance and placement for procedures requiring needle placement in or near specific anatomical locations not easily accessible without such assistance.   Informed Consent Details: Physician/Practitioner Attestation; Transcribe to consent form and obtain patient signature    Nursing Order: Transcribe to  consent form and obtain patient signature. Note: Always confirm laterality of pain with Ms. Lilia Pro, before procedure.    Order Specific Question:   Physician/Practitioner  attestation of informed consent for procedure/surgical case    Answer:   I, the physician/practitioner, attest that I have discussed with the patient the benefits, risks, side effects, alternatives, likelihood of achieving goals and potential problems during recovery for the procedure that I have provided informed consent.    Order Specific Question:   Procedure    Answer:   Lumbar Facet Block  under fluoroscopic guidance    Order Specific Question:   Physician/Practitioner performing the procedure    Answer:   Rodrigues Urbanek A. Dossie Arbour MD    Order Specific Question:   Indication/Reason    Answer:   Low Back Pain, with our without leg pain, due to Facet Joint Arthralgia (Joint Pain) Spondylosis (Arthritis of the Spine), without myelopathy or radiculopathy (Nerve Damage).   Provide equipment / supplies at bedside    "Block Tray" (Disposable   single use) Needle type: SpinalSpinal Amount/quantity: 4 Size: Medium (5-inch) Gauge: 22G    Standing Status:   Standing    Number of Occurrences:   1    Order Specific Question:   Specify    Answer:   Block Tray   Informed Consent Details: Physician/Practitioner Attestation; Transcribe to consent form and obtain patient signature    Nursing Order: Transcribe to consent form and obtain patient signature. Note: Always confirm laterality of pain with Ms. Lilia Pro, before procedure.    Order Specific Question:   Physician/Practitioner attestation of informed consent for procedure/surgical case    Answer:   I, the physician/practitioner, attest that I have discussed with the patient the benefits, risks, side effects, alternatives, likelihood of achieving goals and potential problems during recovery for the procedure that I have provided informed consent.    Order Specific Question:   Procedure     Answer:   Sacroiliac Joint Block    Order Specific Question:   Physician/Practitioner performing the procedure    Answer:   Jamilia Jacques A. Dossie Arbour, MD    Order Specific Question:   Indication/Reason    Answer:   Chronic Low Back and Hip Pain secondary to Sacroiliac Joint Pain (Arthralgia/Arthropathy)   Provide equipment / supplies at bedside    "Block Tray" (Disposable   single use) Needle type: SpinalSpinal Amount/quantity: 2 Size: Regular (3.5-inch) Gauge: 22G    Standing Status:   Standing    Number of Occurrences:   1    Order Specific Question:   Specify    Answer:   Block Tray   Chronic Opioid Analgesic:  No opioid analgesics from our practice.   Medications ordered for procedure: Meds ordered this encounter  Medications   lidocaine (XYLOCAINE) 2 % (with pres) injection 400 mg   pentafluoroprop-tetrafluoroeth (GEBAUERS) aerosol   lactated ringers infusion 1,000 mL   midazolam (VERSED) 5 MG/5ML injection 0.5-2 mg    Make sure Flumazenil is available in the pyxis when using this medication. If oversedation occurs, administer 0.2 mg IV over 15 sec. If after 45 sec no response, administer 0.2 mg again over 1 min; may repeat at 1 min intervals; not to exceed 4 doses (1 mg)   ropivacaine (PF) 2 mg/mL (0.2%) (NAROPIN) injection 18 mL   triamcinolone acetonide (KENALOG-40) injection 80 mg   methylPREDNISolone acetate (DEPO-MEDROL) injection 80 mg   ropivacaine (PF) 2 mg/mL (0.2%) (NAROPIN) injection 9 mL   Medications administered: We administered lidocaine, lactated ringers, midazolam, ropivacaine (PF) 2 mg/mL (0.2%), triamcinolone acetonide, methylPREDNISolone acetate, and ropivacaine (PF) 2 mg/mL (0.2%).  See the medical record for exact dosing, route,  and time of administration.  Follow-up plan:   Return in about 2 weeks (around 06/01/2021) for Proc-day (T,Th), (VV), (PPE).       Interventional Therapies  Risk   Complexity Considerations:   WNL   Planned   Pending:    Diagnostic bilateral lumbar facet and SI joint block to determine if the prior radiofrequency's have worn off.   Under consideration:   Diagnostic bilateral hip joint injection  Possible bilateral femoral + obturator NB  Diagnostic right L4-5 LESI #2    Completed:   Diagnostic/therapeutic right L4-5 LESI x1 (01/22/2019) (50/50/90 x2 weeks)  Diagnostic bilateral SI Blk x2 (02/09/2020) (100/100/80) (100/100/100/50)  Therapeutic right SI RFA x1 (03/17/2020) (100/100/100/90-100)  Therapeutic left SI RFA x1 (03/31/2020) (100/100/100 x3 weeks/70)  Diagnostic bilateral lumbar facet MBB x2 (01/22/2017; 04/18/2017) (100/100/90/90) (100/90/100/100)  Palliative bilateral lumbar facet MBB x3 (10/09/2018) (100/100/95/90-100/duration of up to 8 months)  Therapeutic right lumbar facet RFA x1 (07/21/2019) (100/100/75)  Therapeutic left lumbar facet RFA x1 (08/06/2019)  (100/100/75)    Palliative options:   Palliative lumbar facet MBB  Therapeutic lumbar facet RFA  Therapeutic SI RFA  Palliative SI joint Blk  Therapeutic L4-5 LESI      Recent Visits Date Type Provider Dept  05/03/21 Office Visit Ashley Pointer, MD [facility]  Showing recent visits within past 90 days and meeting all other requirements Today's Visits Date Type Provider Dept  05/18/21 Procedure visit Ashley Pointer, MD [facility]  Showing today's visits and meeting all other requirements Future Appointments Date Type Provider Dept  06/06/21 Appointment Ashley Pointer, MD [facility]  Showing future appointments within next 90 days and meeting all other requirements  Disposition: Discharge home  Discharge (Date   Time): 05/18/2021; 1140 hrs.   Primary Care Physician: Ashley Crews, MD Location: [facility] Note by: Ashley Cola, MD Date: 05/18/2021; Time: 12:22 PM  Disclaimer:  Medicine is not an Chief Strategy Officer. The only guarantee in medicine is  that nothing is guaranteed. It is important to note that the decision to proceed with this intervention was based on the information collected from the patient. The Data and conclusions were drawn from the patient's questionnaire, the interview, and the physical examination. Because the information was provided in large part by the patient, it cannot be guaranteed that it has not been purposely or unconsciously manipulated. Every effort has been made to obtain as much relevant data as possible for this evaluation. It is important to note that the conclusions that lead to this procedure are derived in large part from the available data. Always take into account that the treatment will also be dependent on availability of resources and existing treatment guidelines, considered by other Pain Management Practitioners as being common knowledge and practice, at the time of the intervention. For Medico-Legal purposes, it is also important to point out that variation in procedural techniques and pharmacological choices are the acceptable norm. The indications, contraindications, technique, and results of the above procedure should only be interpreted and judged by a Board-Certified Interventional Pain Specialist with extensive familiarity and expertise in the same exact procedure and technique.

## 2021-05-18 ENCOUNTER — Ambulatory Visit
Admission: RE | Admit: 2021-05-18 | Discharge: 2021-05-18 | Disposition: A | Discharge status: Home / Self Care | Payer: Medicare Other | Source: Ambulatory Visit | Attending: Pain Medicine | Admitting: Pain Medicine

## 2021-05-18 ENCOUNTER — Encounter: Payer: Self-pay | Admitting: Pain Medicine

## 2021-05-18 ENCOUNTER — Other Ambulatory Visit: Payer: Self-pay

## 2021-05-18 ENCOUNTER — Ambulatory Visit (HOSPITAL_BASED_OUTPATIENT_CLINIC_OR_DEPARTMENT_OTHER): Payer: Medicare Other | Admitting: Pain Medicine

## 2021-05-18 VITALS — BP 157/97 | HR 64 | Temp 97.3°F | Resp 16 | Ht 61.0 in | Wt 148.0 lb

## 2021-05-18 DIAGNOSIS — M533 Sacrococcygeal disorders, not elsewhere classified: Secondary | ICD-10-CM | POA: Diagnosis not present

## 2021-05-18 DIAGNOSIS — M47816 Spondylosis without myelopathy or radiculopathy, lumbar region: Secondary | ICD-10-CM | POA: Insufficient documentation

## 2021-05-18 DIAGNOSIS — M779 Enthesopathy, unspecified: Secondary | ICD-10-CM

## 2021-05-18 DIAGNOSIS — M5432 Sciatica, left side: Secondary | ICD-10-CM | POA: Insufficient documentation

## 2021-05-18 DIAGNOSIS — M461 Sacroiliitis, not elsewhere classified: Secondary | ICD-10-CM | POA: Insufficient documentation

## 2021-05-18 DIAGNOSIS — Z79899 Other long term (current) drug therapy: Secondary | ICD-10-CM | POA: Insufficient documentation

## 2021-05-18 DIAGNOSIS — M4310 Spondylolisthesis, site unspecified: Secondary | ICD-10-CM

## 2021-05-18 DIAGNOSIS — M47817 Spondylosis without myelopathy or radiculopathy, lumbosacral region: Secondary | ICD-10-CM | POA: Insufficient documentation

## 2021-05-18 DIAGNOSIS — G8929 Other chronic pain: Secondary | ICD-10-CM | POA: Diagnosis not present

## 2021-05-18 DIAGNOSIS — L409 Psoriasis, unspecified: Secondary | ICD-10-CM | POA: Insufficient documentation

## 2021-05-18 DIAGNOSIS — M545 Low back pain, unspecified: Secondary | ICD-10-CM | POA: Insufficient documentation

## 2021-05-18 DIAGNOSIS — M5137 Other intervertebral disc degeneration, lumbosacral region: Secondary | ICD-10-CM

## 2021-05-18 DIAGNOSIS — M47898 Other spondylosis, sacral and sacrococcygeal region: Secondary | ICD-10-CM

## 2021-05-18 DIAGNOSIS — M4300 Spondylolysis, site unspecified: Secondary | ICD-10-CM

## 2021-05-18 DIAGNOSIS — M9904 Segmental and somatic dysfunction of sacral region: Secondary | ICD-10-CM | POA: Insufficient documentation

## 2021-05-18 DIAGNOSIS — R5383 Other fatigue: Secondary | ICD-10-CM | POA: Insufficient documentation

## 2021-05-18 MED ORDER — ROPIVACAINE HCL 2 MG/ML IJ SOLN
18.0000 mL | Freq: Once | INTRAMUSCULAR | Status: AC
  Administered 2021-05-18: 18 mL via PERINEURAL

## 2021-05-18 MED ORDER — PENTAFLUOROPROP-TETRAFLUOROETH EX AERO
INHALATION_SPRAY | Freq: Once | CUTANEOUS | Status: DC
  Filled 2021-05-18: qty 116

## 2021-05-18 MED ORDER — MIDAZOLAM HCL 5 MG/5ML IJ SOLN
0.5000 mg | Freq: Once | INTRAMUSCULAR | Status: AC
  Administered 2021-05-18: 2 mg via INTRAVENOUS
  Filled 2021-05-18: qty 5

## 2021-05-18 MED ORDER — LACTATED RINGERS IV SOLN
1000.0000 mL | Freq: Once | INTRAVENOUS | Status: AC
  Administered 2021-05-18: 1000 mL via INTRAVENOUS

## 2021-05-18 MED ORDER — ROPIVACAINE HCL 2 MG/ML IJ SOLN
9.0000 mL | Freq: Once | INTRAMUSCULAR | Status: AC
  Administered 2021-05-18: 9 mL via INTRA_ARTICULAR

## 2021-05-18 MED ORDER — TRIAMCINOLONE ACETONIDE 40 MG/ML IJ SUSP
80.0000 mg | Freq: Once | INTRAMUSCULAR | Status: AC
  Administered 2021-05-18: 80 mg
  Filled 2021-05-18: qty 2

## 2021-05-18 MED ORDER — METHYLPREDNISOLONE ACETATE 80 MG/ML IJ SUSP
80.0000 mg | Freq: Once | INTRAMUSCULAR | Status: AC
  Administered 2021-05-18: 80 mg via INTRA_ARTICULAR
  Filled 2021-05-18: qty 1

## 2021-05-18 MED ORDER — LIDOCAINE HCL 2 % IJ SOLN
20.0000 mL | Freq: Once | INTRAMUSCULAR | Status: AC
  Administered 2021-05-18: 400 mg
  Filled 2021-05-18: qty 40

## 2021-05-18 NOTE — Patient Instructions (Signed)

## 2021-05-18 NOTE — Progress Notes (Signed)
Safety precautions to be maintained throughout the outpatient stay will include: orient to surroundings, keep bed in low position, maintain call bell within reach at all times, provide assistance with transfer out of bed and ambulation.  

## 2021-05-19 ENCOUNTER — Telehealth: Payer: Self-pay

## 2021-05-19 NOTE — Telephone Encounter (Signed)
Post procedure phone call. Patient states she is doing good.  

## 2021-05-29 ENCOUNTER — Other Ambulatory Visit: Payer: Medicare Other

## 2021-05-30 DIAGNOSIS — M9901 Segmental and somatic dysfunction of cervical region: Secondary | ICD-10-CM | POA: Diagnosis not present

## 2021-05-30 DIAGNOSIS — M9903 Segmental and somatic dysfunction of lumbar region: Secondary | ICD-10-CM | POA: Diagnosis not present

## 2021-05-30 DIAGNOSIS — M542 Cervicalgia: Secondary | ICD-10-CM | POA: Diagnosis not present

## 2021-05-30 DIAGNOSIS — M5416 Radiculopathy, lumbar region: Secondary | ICD-10-CM | POA: Diagnosis not present

## 2021-06-06 ENCOUNTER — Other Ambulatory Visit: Payer: Self-pay

## 2021-06-06 ENCOUNTER — Ambulatory Visit: Payer: Medicare Other | Attending: Pain Medicine | Admitting: Pain Medicine

## 2021-06-06 DIAGNOSIS — M533 Sacrococcygeal disorders, not elsewhere classified: Secondary | ICD-10-CM

## 2021-06-06 DIAGNOSIS — M545 Low back pain, unspecified: Secondary | ICD-10-CM

## 2021-06-06 DIAGNOSIS — G8929 Other chronic pain: Secondary | ICD-10-CM | POA: Diagnosis not present

## 2021-06-06 DIAGNOSIS — M5137 Other intervertebral disc degeneration, lumbosacral region: Secondary | ICD-10-CM

## 2021-06-06 DIAGNOSIS — M47816 Spondylosis without myelopathy or radiculopathy, lumbar region: Secondary | ICD-10-CM | POA: Diagnosis not present

## 2021-06-06 DIAGNOSIS — M4310 Spondylolisthesis, site unspecified: Secondary | ICD-10-CM

## 2021-06-06 NOTE — Progress Notes (Signed)
Patient: Ashley Rodriguez  Service Category: E/M  Provider: Gaspar Cola, MD  DOB: Dec 16, 1950  DOS: 06/06/2021  Location: Office  MRN: [id number]  Setting: Ambulatory outpatient  Referring Provider: Virginia Crews, MD  Type: Established Patient  Specialty: Interventional Pain Management  PCP: Virginia Crews, MD  Location: Remote location  Delivery: TeleHealth     Virtual Encounter - Pain Management PROVIDER NOTE: Information contained herein reflects review and annotations entered in association with encounter. Interpretation of such information and data should be left to medically-trained personnel. Information provided to patient can be located elsewhere in the medical record under "Patient Instructions". Document created using STT-dictation technology, any transcriptional errors that may result from process are unintentional.    Contact & Pharmacy Preferred: [place]: [phone] (home) Mobile: [phone] (mobile) E-mail: [email or web address]  [facility] - [place], [place] [address] [place] [address] Phone: [phone] Fax: [place] [phone] [place], [place] - [address] [place] [address] Phone: [phone] Fax: [phone]   Pre-screening  Ms. Ditmore offered "in-person" vs "virtual" encounter. She indicated preferring virtual for this encounter.   Reason COVID-19*   Social distancing based on CDC and AMA recommendations.   I contacted [name] on 06/06/2021 via telephone.      I clearly identified myself as Gaspar Cola, MD. I verified that I was speaking with the correct person using two identifiers (Name: Mandie Crabbe, and date of birth: Jan 01, 1951).  Consent I sought verbal advanced consent from [name] for virtual visit interactions. I informed Ms. Goodreau of possible security and privacy concerns, risks, and limitations associated with  providing "not-in-person" medical evaluation and management services. I also informed Ms. Pianka of the availability of "in-person" appointments. Finally, I informed her that there would be a charge for the virtual visit and that she could be  personally, fully or partially, financially responsible for it. Ms. Seng expressed understanding and agreed to proceed.   Historic Elements   Ms. Lakrisha Iseman is a 71 y.o. year old, female patient evaluated today after our last contact on 05/18/2021. Ms. Dede  has a past medical history of COVID-19 (12/15/2018), Flank lipoma (08/17/2016), Hypertension, Neck mass (04/07/2013), Psoriatic arthritis ([place]), Recurrent cellulitis, and T2DM (type 2 diabetes mellitus) ([place]) (10/01/2018). She also  has a past surgical history that includes Abdominal hysterectomy (2000); lipoma removed (04/2012); Colonoscopy (2015); and Neck mass excision (04/28/14). Ms. Kohli has a current medication list which includes the following prescription(s): acetaminophen, amlodipine, atenolol, betamethasone dipropionate, calcium carbonate, co-enzyme B-35, enbrel sureclick, esomeprazole, ezetimibe, fexofenadine, fluad quadrivalent, fluticasone, gabapentin, losartan, meloxicam, montelukast, multivitamin with minerals, turmeric, and valacyclovir, and the following Facility-Administered Medications: triamcinolone acetonide. She  reports that she has never smoked. She has never used smokeless tobacco. She reports current alcohol use. She reports that she does not use drugs. Ms. Zavadil is allergic to atorvastatin, sulfamethoxazole-trimethoprim, codeine, and sulfa antibiotics.   HPI  Today, she is being contacted for a post-procedure assessment.  On 05/18/2021 we went ahead and did a bilateral lumbar facet and SI joint block for the purpose of determining whether or not the prior radiofrequency ablations had worn off.  The patient indicates having attained 100% relief of the pain for the duration of the local  anesthetic which in fact has continued and it is ongoing.  She has pointed out that the combination of doing the facet blocks in the SI joints at the same time  seems to have worked a lot better than doing either one separately.  At this point she is doing very well and refers not needing anything else for the time being.  She has been instructed to give [name] a call as soon as the benefits were off.  I believe that we have confirmed that her radiofrequency ablation has worn off and therefore we could repeat it in the future when her pain begins to come back.  The plan was shared with the patient who understood and accepted.  Post-procedure evaluation     Procedure:          Anesthesia, Analgesia, Anxiolysis:  Procedure #1: Type: Medial Branch Facet Block #5 Primary Purpose: Diagnostic Region: Lumbar Level: L2, L3, L4, L5, & S1 Medial Branch Level(s) Target Area: For Lumbar Facet blocks, the target is the groove formed by the junction of the transverse process and superior articular process. For the L5 dorsal ramus, the target is the notch between superior articular process and sacral ala. For the S1 dorsal ramus, the target is the superior and lateral edge of the posterior S1 Sacral foramen. Approach: Posterior, paramedial, percutaneous approach. Laterality: Bilateral  Procedure #2: Type: Sacroiliac Joint Block  #3  Primary Purpose: Diagnostic Region: Posterior Lumbosacral Level: PSIS (Posterior Superior Iliac Spine) Sacroiliac Joint Target Area: For upper sacroiliac joint block(s), the target is the superior and posterior margin of the sacroiliac joint. Approach: Ipsilateral approach. Laterality: Bilateral  Anesthesia: Local (1-2% Lidocaine)  Anxiolysis: None  Sedation: None  Guidance: Fluoroscopy           Position: Prone   1. Lumbar facet syndrome (Bilateral) (R>L)   2. Spondylosis without myelopathy or radiculopathy, lumbosacral region   3. Grade 1 Anterolisthesis of L5 over S1   4.  L5 pars defect with spondylolisthesis (Bilateral)   5. DDD (degenerative disc disease), lumbosacral   6. Chronic low back pain (Bilateral) w/o sciatica   7. Chronic sacroiliac joint pain (Bilateral) (R>L)   8. Other spondylosis, sacral and sacrococcygeal region   9. Enthesopathy of sacroiliac joint   10. Osteoarthritis of sacroiliac joints (Bilateral)   11. Somatic dysfunction of sacroiliac joints (Bilateral)    NAS-11 Pain score:   Pre-procedure: 3 /10   Post-procedure: 0-No pain/10      Effectiveness:  Initial hour after procedure: 100 %. Subsequent 4-6 hours post-procedure: 100 %. Analgesia past initial 6 hours: 100 % (still has twinges every now and then.). Ongoing improvement:  Analgesic: The patient indicates having an ongoing 100% relief of her low back pain, bilaterally.  She states that the combination of the facet and SI joint block worked much better than either one of them separate. Function: Ms. Riles reports improvement in function ROM: Ms. Mount reports improvement in ROM  Pharmacotherapy Assessment   Opioid Analgesic: No opioid analgesics from our practice.   Monitoring: [place] PMP: PDMP reviewed during this encounter.       Pharmacotherapy: No side-effects or adverse reactions reported. Compliance: No problems identified. Effectiveness: Clinically acceptable. Plan: Refer to "POC". UDS: No results found for: SUMMARY   Laboratory Chemistry Profile   Renal Lab Results  Component Value Date   BUN 10 03/17/2021   CREATININE 0.58 03/17/2021   BCR 17 03/17/2021   GFRAA 94 05/17/2020   GFRNONAA 81 05/17/2020    Hepatic Lab Results  Component Value Date   AST 28 03/17/2021   ALT 26 03/17/2021   ALBUMIN 5.1 (H) 03/17/2021   ALKPHOS 75 03/17/2021  Electrolytes Lab Results  Component Value Date   NA 141 03/17/2021   K 4.8 03/17/2021   CL 100 03/17/2021   CALCIUM 10.0 03/17/2021   MG 2.1 01/03/2017    Bone Lab Results  Component Value Date    25OHVITD1 50 01/03/2017   25OHVITD2 <1.0 01/03/2017   25OHVITD3 50 01/03/2017    Inflammation (CRP: Acute Phase) (ESR: Chronic Phase) Lab Results  Component Value Date   CRP 0.8 01/03/2017   ESRSEDRATE 2 01/25/2020         Note: Above Lab results reviewed.  Imaging  DG PAIN CLINIC C-ARM 1-60 MIN NO REPORT Fluoro was used, but no Radiologist interpretation will be provided.  Please refer to "NOTES" tab for provider progress note.  Assessment  The primary encounter diagnosis was Chronic low back pain (Bilateral) w/o sciatica. Diagnoses of Lumbar facet syndrome (Bilateral) (R>L), Chronic sacroiliac joint pain (Bilateral) (R>L), Grade 1 Anterolisthesis of L5 over S1, and DDD (degenerative disc disease), lumbosacral were also pertinent to this visit.  Plan of Care  Problem-specific:  No problem-specific Assessment & Plan notes found for this encounter.  Ms. Maysen Bonsignore has a current medication list which includes the following long-term medication(s): amlodipine, atenolol, calcium carbonate, enbrel sureclick, esomeprazole, ezetimibe, fexofenadine, fluticasone, gabapentin, losartan, and montelukast.  Pharmacotherapy (Medications Ordered): No orders of the defined types were placed in this encounter.  Orders:  No orders of the defined types were placed in this encounter.  Follow-up plan:   No follow-ups on file.     Interventional Therapies  Risk   Complexity Considerations:   WNL   Planned   Pending:      Under consideration:   Therapeutic/palliative bilateral lumbar facet + sacroiliac joint RFA #2    Completed:   Diagnostic/therapeutic right L4-5 LESI x1 (01/22/2019) (50/50/90 x2 weeks)  Diagnostic bilateral SI Blk x2 (02/09/2020) (100/100/80) (100/100/100/50)  Therapeutic right SI RFA x1 (03/17/2020) (100/100/100/90-100)  Therapeutic left SI RFA x1 (03/31/2020) (100/100/100 x3 weeks/70)  Diagnostic bilateral lumbar facet MBB x2 (01/22/2017; 04/18/2017) (100/100/90/90)  (100/90/100/100)  Palliative bilateral lumbar facet MBB x3 (10/09/2018) (100/100/95/90-100/duration of up to 8 months)  Therapeutic right lumbar facet RFA x1 (07/21/2019) (100/100/75)  Therapeutic left lumbar facet RFA x1 (08/06/2019)  (100/100/75)  Therapeutic bilateral lumbar facet and SI joint Blk x1 (05/18/2021) (100/100/100) patient states that the combination works better than individually.   Palliative options:   Palliative lumbar facet MBB  Therapeutic lumbar facet RFA  Therapeutic SI RFA  Palliative SI joint Blk  Therapeutic L4-5 LESI     Recent Visits Date Type Provider Dept  05/18/21 Procedure visit Milinda Pointer, MD [facility]  05/03/21 Office Visit Milinda Pointer, MD [facility]  Showing recent visits within past 90 days and meeting all other requirements Today's Visits Date Type Provider Dept  06/06/21 Office Visit Milinda Pointer, MD [facility]  Showing today's visits and meeting all other requirements Future Appointments No visits were found meeting these conditions. Showing future appointments within next 90 days and meeting all other requirements  I discussed the assessment and treatment plan with the patient. The patient was provided an opportunity to ask questions and all were answered. The patient agreed with the plan and demonstrated an understanding of the instructions.  Patient advised to call back or seek an in-person evaluation if the symptoms or condition worsens.  Duration of encounter: 15 minutes.  Note by: Gaspar Cola, MD Date: 06/06/2021; Time: 12:48 PM

## 2021-06-13 ENCOUNTER — Other Ambulatory Visit: Payer: Self-pay | Admitting: Family Medicine

## 2021-06-13 DIAGNOSIS — M9903 Segmental and somatic dysfunction of lumbar region: Secondary | ICD-10-CM | POA: Diagnosis not present

## 2021-06-13 DIAGNOSIS — M9901 Segmental and somatic dysfunction of cervical region: Secondary | ICD-10-CM | POA: Diagnosis not present

## 2021-06-13 DIAGNOSIS — M542 Cervicalgia: Secondary | ICD-10-CM | POA: Diagnosis not present

## 2021-06-13 DIAGNOSIS — M5416 Radiculopathy, lumbar region: Secondary | ICD-10-CM | POA: Diagnosis not present

## 2021-06-14 NOTE — Telephone Encounter (Signed)
Requested Prescriptions  Pending Prescriptions Disp Refills   esomeprazole (NEXIUM) 40 MG capsule [Pharmacy Med Name: ESOMEPRAZOLE MAGNESIUM 40 MG CAP] 90 capsule 2    Sig: TAKE 1 CAPSULE BY MOUTH ONCE DAILY     Gastroenterology: Proton Pump Inhibitors 2 Passed - 06/13/2021  1:18 PM      Passed - ALT in normal range and within 360 days    ALT  Date Value Ref Range Status  03/17/2021 26 0 - 32 IU/L Final         Passed - AST in normal range and within 360 days    AST  Date Value Ref Range Status  03/17/2021 28 0 - 40 IU/L Final         Passed - Valid encounter within last 12 months    Recent Outpatient Visits          2 months ago Hypertension associated with diabetes [facility])   [facility], Dionne Bucy, MD   11 months ago Allergic rhinitis with postnasal drip   [facility], Dionne Bucy, MD   11 months ago Viral URI   [facility], Dionne Bucy, MD   1 year ago Type 2 diabetes mellitus with other specified complication, without long-term current use of insulin [facility])   [facility], Dionne Bucy, MD   1 year ago Type 2 diabetes mellitus with other specified complication, without long-term current use of insulin [facility])   [place], Dionne Bucy, MD      Future Appointments            In 2 weeks Bacigalupo, Dionne Bucy, MD [facility]

## 2021-06-19 LAB — HM DIABETES EYE EXAM

## 2021-06-27 DIAGNOSIS — M542 Cervicalgia: Secondary | ICD-10-CM | POA: Diagnosis not present

## 2021-06-27 DIAGNOSIS — M5416 Radiculopathy, lumbar region: Secondary | ICD-10-CM | POA: Diagnosis not present

## 2021-06-27 DIAGNOSIS — M9903 Segmental and somatic dysfunction of lumbar region: Secondary | ICD-10-CM | POA: Diagnosis not present

## 2021-06-27 DIAGNOSIS — M9901 Segmental and somatic dysfunction of cervical region: Secondary | ICD-10-CM | POA: Diagnosis not present

## 2021-07-04 ENCOUNTER — Ambulatory Visit (INDEPENDENT_AMBULATORY_CARE_PROVIDER_SITE_OTHER): Payer: Medicare Other | Admitting: Family Medicine

## 2021-07-04 ENCOUNTER — Other Ambulatory Visit: Payer: Self-pay

## 2021-07-04 ENCOUNTER — Encounter: Payer: Self-pay | Admitting: Family Medicine

## 2021-07-04 VITALS — BP 137/76 | HR 59 | Temp 97.8°F | Resp 16 | Wt 151.3 lb

## 2021-07-04 DIAGNOSIS — E1169 Type 2 diabetes mellitus with other specified complication: Secondary | ICD-10-CM

## 2021-07-04 DIAGNOSIS — I152 Hypertension secondary to endocrine disorders: Secondary | ICD-10-CM

## 2021-07-04 DIAGNOSIS — E785 Hyperlipidemia, unspecified: Secondary | ICD-10-CM | POA: Diagnosis not present

## 2021-07-04 DIAGNOSIS — E1159 Type 2 diabetes mellitus with other circulatory complications: Secondary | ICD-10-CM

## 2021-07-04 DIAGNOSIS — M791 Myalgia, unspecified site: Secondary | ICD-10-CM | POA: Diagnosis not present

## 2021-07-04 DIAGNOSIS — T466X5A Adverse effect of antihyperlipidemic and antiarteriosclerotic drugs, initial encounter: Secondary | ICD-10-CM

## 2021-07-04 LAB — POCT GLYCOSYLATED HEMOGLOBIN (HGB A1C)
Est. average glucose Bld gHb Est-mCnc: 123
Hemoglobin A1C: 5.9 % — AB (ref 4.0–5.6)

## 2021-07-04 NOTE — Assessment & Plan Note (Signed)
Continue Zetia and co-Q10 ?History of statin induced myalgias ?Recheck FLP annually ?

## 2021-07-04 NOTE — Assessment & Plan Note (Signed)
Statin intolerant 

## 2021-07-04 NOTE — Assessment & Plan Note (Signed)
Well controlled, chronic ?Diet control, on no meds ?Up-to-date on vaccinations and screenings ?uACR today ?On ARB ?Statin intolerant ?Discussed diet and exercise ?Follow-up in 6 months ?

## 2021-07-04 NOTE — Assessment & Plan Note (Signed)
Well controlled Continue current medications Recheck metabolic panel F/u in 6 months  

## 2021-07-04 NOTE — Progress Notes (Signed)
I,Sulibeya S Dimas,acting as a Education administrator for Lavon Paganini, MD.,have documented all relevant documentation on the behalf of Lavon Paganini, MD,as directed by  Lavon Paganini, MD while in the presence of Lavon Paganini, MD.   Established patient visit   Patient: Ashley Rodriguez   DOB: April 11, 1951   71 y.o. Female  MRN: [id number] Visit Date: 07/04/2021  Today's healthcare provider: Lavon Paganini, MD   Chief Complaint  Patient presents with   Hypertension   Subjective     Hypertension, follow-up  BP Readings from Last 3 Encounters:  07/04/21 137/76  05/18/21 (!) 157/97  05/03/21 133/79   Wt Readings from Last 3 Encounters:  07/04/21 151 lb 4.8 oz (68.6 kg)  05/18/21 148 lb (67.1 kg)  05/03/21 159 lb (72.1 kg)     She was last seen for hypertension 3 months ago.  BP at that visit was 140/80. Management since that visit includes no changes.  She reports excellent compliance with treatment. She is not having side effects.  She is following a low sodium diet. She is exercising. She does not smoke.  Use of agents associated with hypertension: none.   Outside blood pressures are not being checked at home. Patient did bring in BP readings from other office. 133-159/60-80 Symptoms: No chest pain No chest pressure  No palpitations No syncope  No dyspnea No orthopnea  No paroxysmal nocturnal dyspnea No lower extremity edema   Pertinent labs: Lab Results  Component Value Date   CHOL 194 03/17/2021   HDL 50 03/17/2021   LDLCALC 117 (H) 03/17/2021   TRIG 151 (H) 03/17/2021   CHOLHDL 3.9 03/17/2021   Lab Results  Component Value Date   NA 141 03/17/2021   K 4.8 03/17/2021   CREATININE 0.58 03/17/2021   EGFR 99 03/17/2021   GLUCOSE 105 (H) 03/17/2021   TSH 3.55 02/26/2017     The 10-year ASCVD risk score (Arnett DK, et al., 2019) is: 16.4%   --------------------------------------------------------------------------------------------------- Diabetes  Mellitus Type II, Follow-up  Lab Results  Component Value Date   HGBA1C 5.9 (A) 07/04/2021   HGBA1C 5.8 (A) 03/17/2021   HGBA1C 6.0 (H) 07/04/2020   Wt Readings from Last 3 Encounters:  07/04/21 151 lb 4.8 oz (68.6 kg)  05/18/21 148 lb (67.1 kg)  05/03/21 159 lb (72.1 kg)   Last seen for diabetes 3 months ago.  Management since then includes no changes. She reports excellent compliance with treatment. She is not having side effects.  Symptoms: No fatigue No foot ulcerations  No appetite changes No nausea  Yes paresthesia of the feet  No polydipsia  No polyuria No visual disturbances   No vomiting     Home blood sugar records:  not being checked  Episodes of hypoglycemia? No    Current insulin regiment: none Most Recent Eye Exam: UTD Current exercise: swimming and golf Current diet habits: in general, a "healthy" diet    Pertinent Labs: Lab Results  Component Value Date   CHOL 194 03/17/2021   HDL 50 03/17/2021   LDLCALC 117 (H) 03/17/2021   TRIG 151 (H) 03/17/2021   CHOLHDL 3.9 03/17/2021   Lab Results  Component Value Date   NA 141 03/17/2021   K 4.8 03/17/2021   CREATININE 0.58 03/17/2021   EGFR 99 03/17/2021   MICROALBUR 50 01/01/2019     ---------------------------------------------------------------------------------------------------   Medications: Outpatient Medications Prior to Visit  Medication Sig   acetaminophen (TYLENOL) 325 MG tablet Take 325 mg by mouth  every 6 (six) hours as needed.   amLODipine (NORVASC) 5 MG tablet TAKE 1 TABLET BY MOUTH ONCE A DAY AS NEEDED FOR BLOOD PRESSURE   atenolol (TENORMIN) 50 MG tablet TAKE 1 TABLET BY MOUTH ONCE DAILY   betamethasone dipropionate 0.05 % lotion Apply 1 application topically as needed.    calcium carbonate (OS-CAL) 600 MG TABS tablet Take 600 mg by mouth daily with breakfast.   co-enzyme Q-10 30 MG capsule Take 100 mg by mouth daily.   ENBREL SURECLICK 50 MG/ML injection Inject 50 mg into the  skin once a week.    esomeprazole (NEXIUM) 40 MG capsule TAKE 1 CAPSULE BY MOUTH ONCE DAILY   ezetimibe (ZETIA) 10 MG tablet Take 1 tablet (10 mg total) by mouth 3 (three) times a week.   fexofenadine (ALLEGRA) 180 MG tablet Take 180 mg by mouth daily.   FLUAD QUADRIVALENT 0.5 ML injection    fluticasone (FLONASE) 50 MCG/ACT nasal spray PLACE 2 SPRAYS INTO EACH NOSTRIL ONCE DAILY   gabapentin (NEURONTIN) 300 MG capsule Take 2 capsules AM and 3 capsules PM (Patient taking differently: Take 2 capsules AM and 2 capsules PM)   losartan (COZAAR) 25 MG tablet TAKE 1/2 TABLET (12.5 MG TOTAL) BY MOUTHONCE DAILY   meloxicam (MOBIC) 15 MG tablet Take 15 mg by mouth daily.    montelukast (SINGULAIR) 10 MG tablet TAKE 1 TABLET BY MOUTH AT BEDTIME   Multiple Vitamins-Minerals (MULTIVITAMIN WITH MINERALS) tablet Take 1 tablet by mouth daily.   TURMERIC PO Take by mouth daily at 6 (six) AM.   valACYclovir (VALTREX) 1000 MG tablet TAKE 2 TABLETS BY MOUTH TWICE DAILY FOR 1 DAY WHEN FEVER BLISTER OCCURS   Facility-Administered Medications Prior to Visit  Medication Dose Route Frequency Provider   triamcinolone acetonide (KENALOG-40) injection 60 mg  60 mg Other Once [place], Max T, DPM    Review of Systems  Constitutional:  Negative for appetite change and fatigue.  Respiratory:  Positive for shortness of breath. Negative for chest tightness.   Cardiovascular:  Negative for chest pain, palpitations and leg swelling.       Objective    BP 137/76 (BP Location: Left Arm, Patient Position: Sitting, Cuff Size: Large)    Pulse (!) 59    Temp 97.8 F (36.6 C) (Temporal)    Resp 16    Wt 151 lb 4.8 oz (68.6 kg)    BMI 28.59 kg/m  BP Readings from Last 3 Encounters:  07/04/21 137/76  05/18/21 (!) 157/97  05/03/21 133/79   Wt Readings from Last 3 Encounters:  07/04/21 151 lb 4.8 oz (68.6 kg)  05/18/21 148 lb (67.1 kg)  05/03/21 159 lb (72.1 kg)      Physical Exam Vitals reviewed.  Constitutional:       General: She is not in acute distress.    Appearance: Normal appearance. She is well-developed. She is not diaphoretic.  HENT:     Head: Normocephalic and atraumatic.     Right Ear: Tympanic membrane, ear canal and external ear normal.     Left Ear: Tympanic membrane, ear canal and external ear normal.     Nose: Nose normal.     Mouth/Throat:     Mouth: Mucous membranes are moist.     Pharynx: Oropharynx is clear. No oropharyngeal exudate.  Eyes:     General: No scleral icterus.    Conjunctiva/sclera: Conjunctivae normal.  Neck:     Thyroid: No thyromegaly.  Cardiovascular:  Rate and Rhythm: Normal rate and regular rhythm.     Pulses: Normal pulses.     Heart sounds: Normal heart sounds. No murmur heard. Pulmonary:     Effort: Pulmonary effort is normal. No respiratory distress.     Breath sounds: Normal breath sounds. No wheezing, rhonchi or rales.  Musculoskeletal:     Cervical back: Neck supple.     Right lower leg: No edema.     Left lower leg: No edema.  Lymphadenopathy:     Cervical: No cervical adenopathy.  Skin:    General: Skin is warm and dry.     Findings: No rash.  Neurological:     Mental Status: She is alert and oriented to person, place, and time. Mental status is at baseline.  Psychiatric:        Mood and Affect: Mood normal.        Behavior: Behavior normal.      Results for orders placed or performed in visit on 07/04/21  POCT glycosylated hemoglobin (Hb A1C)  Result Value Ref Range   Hemoglobin A1C 5.9 (A) 4.0 - 5.6 %   Est. average glucose Bld gHb Est-mCnc 123     Assessment & Plan     Problem List Items Addressed This Visit       Cardiovascular and Mediastinum   Hypertension associated with diabetes ([place]) - Primary    Well controlled Continue current medications Recheck metabolic panel F/u in 6 months       Relevant Orders   Basic Metabolic Panel (BMET)     Endocrine   T2DM (type 2 diabetes mellitus) ([place])    Well controlled,  chronic Diet control, on no meds Up-to-date on vaccinations and screenings uACR today On ARB Statin intolerant Discussed diet and exercise Follow-up in 6 months      Relevant Orders   POCT glycosylated hemoglobin (Hb A1C) (Completed)   Urine Microalbumin w/creat. ratio   Hyperlipidemia associated with type 2 diabetes mellitus ([place])    Continue Zetia and co-Q10 History of statin induced myalgias Recheck FLP annually        Other   Myalgia due to statin    Statin intolerant        Return in about 6 months (around 01/04/2022) for CPE.      I, Lavon Paganini, MD, have reviewed all documentation for this visit. The documentation on 07/04/21 for the exam, diagnosis, procedures, and orders are all accurate and complete.   Quin Mathenia, Dionne Bucy, MD, MPH [facility]

## 2021-07-05 LAB — BASIC METABOLIC PANEL
BUN/Creatinine Ratio: 17 (ref 12–28)
BUN: 12 mg/dL (ref 8–27)
CO2: 25 mmol/L (ref 20–29)
Calcium: 10.1 mg/dL (ref 8.7–10.3)
Chloride: 101 mmol/L (ref 96–106)
Creatinine, Ser: 0.71 mg/dL (ref 0.57–1.00)
Glucose: 109 mg/dL — ABNORMAL HIGH (ref 70–99)
Potassium: 5 mmol/L (ref 3.5–5.2)
Sodium: 141 mmol/L (ref 134–144)
eGFR: 93 mL/min/{1.73_m2} (ref >59)

## 2021-07-05 LAB — MICROALBUMIN / CREATININE URINE RATIO
Creatinine, Urine: 209.7 mg/dL
Microalb/Creat Ratio: 9 mg/g creat (ref 0–29)
Microalbumin, Urine: 19.9 ug/mL

## 2021-07-07 ENCOUNTER — Ambulatory Visit: Payer: Self-pay

## 2021-07-07 NOTE — Telephone Encounter (Signed)
Summary: sinus congestion, cough  ? Pt stated she is experiencing a lot of sinus congestion and cough, which she mentioned to her PCP at her recent appointment, but everything was okay. However, pt stated she feels it's gotten worse. Pt is requesting medication.   ? ?COVID test was negative (no fever).  ? ?Pt seeking clinical advice.   ?  ? ?Chief Complaint: Cough, sinus drainage with yellow mucus. In office 07/04/21. "I mentioned it to her and she listened to lungs and said I was clear. I've gotten worse.I'd like her to send in some medicine." ?Symptoms: Above ?Frequency: Started 07/03/21 ?Pertinent Negatives: Patient denies fever ?Disposition: '[]'$ ED /'[]'$ Urgent Care (no appt availability in office) / '[]'$ Appointment(In office/virtual)/ '[]'$  [facility]/ '[]'$ Home Care/ '[]'$ Refused Recommended Disposition /'[]'$ [facility]/ '[]'$  Follow-up with PCP ?Additional Notes: Pt. Requesting medication for cough, congestion. Please advise.  ?Answer Assessment - Initial Assessment Questions ?1. ONSET: "When did the cough begin?"  ?    07/03/21 ?2. SEVERITY: "How bad is the cough today?"  ?    Severe ?3. SPUTUM: "Describe the color of your sputum" (none, dry cough; clear, white, yellow, green) ?    Yellow ?4. HEMOPTYSIS: "Are you coughing up any blood?" If so ask: "How much?" (flecks, streaks, tablespoons, etc.) ?    No ?5. DIFFICULTY BREATHING: "Are you having difficulty breathing?" If Yes, ask: "How bad is it?" (e.g., mild, moderate, severe)  ?  - MILD: No SOB at rest, mild SOB with walking, speaks normally in sentences, can lie down, no retractions, pulse < 100.  ?  - MODERATE: SOB at rest, SOB with minimal exertion and prefers to sit, cannot lie down flat, speaks in phrases, mild retractions, audible wheezing, pulse 100-120.  ?  - SEVERE: Very SOB at rest, speaks in single words, struggling to breathe, sitting hunched forward, retractions, pulse > 120  ?    No ?6. FEVER: "Do you have a fever?" If Yes, ask: "What is your  temperature, how was it measured, and when did it start?" ?    No ?7. CARDIAC HISTORY: "Do you have any history of heart disease?" (e.g., heart attack, congestive heart failure)  ?    No ?8. LUNG HISTORY: "Do you have any history of lung disease?"  (e.g., pulmonary embolus, asthma, emphysema) ?    No ?9. PE RISK FACTORS: "Do you have a history of blood clots?" (or: recent major surgery, recent prolonged travel, bedridden) ?    No ?10. OTHER SYMPTOMS: "Do you have any other symptoms?" (e.g., runny nose, wheezing, chest pain) ?      Runny nose ?11. PREGNANCY: "Is there any chance you are pregnant?" "When was your last menstrual period?" ?      No ?12. TRAVEL: "Have you traveled out of the country in the last month?" (e.g., travel history, exposures) ?      No ? ?Protocols used: Cough - Acute Productive-A-AH ? ?

## 2021-07-07 NOTE — Telephone Encounter (Signed)
Patient advised as directed below. Patient stated "I guess I will just suffer I guess" ?

## 2021-07-10 ENCOUNTER — Encounter: Payer: Self-pay | Admitting: Family Medicine

## 2021-07-10 ENCOUNTER — Other Ambulatory Visit: Payer: Self-pay

## 2021-07-10 ENCOUNTER — Ambulatory Visit (INDEPENDENT_AMBULATORY_CARE_PROVIDER_SITE_OTHER): Payer: Medicare Other

## 2021-07-10 ENCOUNTER — Ambulatory Visit: Payer: Self-pay | Admitting: *Deleted

## 2021-07-10 VITALS — Wt 151.0 lb

## 2021-07-10 DIAGNOSIS — Z0000 Encounter for general adult medical examination without abnormal findings: Secondary | ICD-10-CM

## 2021-07-10 MED ORDER — BENZONATATE 100 MG PO CAPS: 100.0000 mg | ORAL_CAPSULE | Freq: Two times a day (BID) | ORAL | 0 refills | Status: DC

## 2021-07-10 MED ORDER — AZITHROMYCIN 250 MG PO TABS: ORAL_TABLET | ORAL | 0 refills | Status: AC

## 2021-07-10 NOTE — Patient Instructions (Signed)
Ashley Rodriguez , Thank you for taking time to come for your Medicare Wellness Visit. I appreciate your ongoing commitment to your health goals. Please review the following plan we discussed and let me know if I can assist you in the future.   Screening recommendations/referrals: Colonoscopy: 02/26/14 Mammogram: 04/03/21 Bone Density: 11/11/18 Recommended yearly ophthalmology/optometry visit for glaucoma screening and checkup Recommended yearly dental visit for hygiene and checkup  Vaccinations: Influenza vaccine: 01/13/21 Pneumococcal vaccine: 03/13/18 Tdap vaccine: 09/08/13 Shingles vaccine: states had shots, no recall of dates   Covid-19:06/15/19, 07/06/19, 02/15/20  Advanced directives: no  Conditions/risks identified: none  Next appointment: Follow up in one year for your annual wellness visit 07/12/22 @ 9:40am by phone   Preventive Care 65 Years and Older, Female Preventive care refers to lifestyle choices and visits with your health care provider that can promote health and wellness. What does preventive care include? A yearly physical exam. This is also called an annual well check. Dental exams once or twice a year. Routine eye exams. Ask your health care provider how often you should have your eyes checked. Personal lifestyle choices, including: Daily care of your teeth and gums. Regular physical activity. Eating a healthy diet. Avoiding tobacco and drug use. Limiting alcohol use. Practicing safe sex. Taking low-dose aspirin every day. Taking vitamin and mineral supplements as recommended by your health care provider. What happens during an annual well check? The services and screenings done by your health care provider during your annual well check will depend on your age, overall health, lifestyle risk factors, and family history of disease. Counseling  Your health care provider may ask you questions about your: Alcohol use. Tobacco use. Drug use. Emotional  well-being. Home and relationship well-being. Sexual activity. Eating habits. History of falls. Memory and ability to understand (cognition). Work and work Statistician. Reproductive health. Screening  You may have the following tests or measurements: Height, weight, and BMI. Blood pressure. Lipid and cholesterol levels. These may be checked every 5 years, or more frequently if you are over 42 years old. Skin check. Lung cancer screening. You may have this screening every year starting at age 43 if you have a 30-pack-year history of smoking and currently smoke or have quit within the past 15 years. Fecal occult blood test (FOBT) of the stool. You may have this test every year starting at age 58. Flexible sigmoidoscopy or colonoscopy. You may have a sigmoidoscopy every 5 years or a colonoscopy every 10 years starting at age 66. Hepatitis C blood test. Hepatitis B blood test. Sexually transmitted disease (STD) testing. Diabetes screening. This is done by checking your blood sugar (glucose) after you have not eaten for a while (fasting). You may have this done every 1-3 years. Bone density scan. This is done to screen for osteoporosis. You may have this done starting at age 91. Mammogram. This may be done every 1-2 years. Talk to your health care provider about how often you should have regular mammograms. Talk with your health care provider about your test results, treatment options, and if necessary, the need for more tests. Vaccines  Your health care provider may recommend certain vaccines, such as: Influenza vaccine. This is recommended every year. Tetanus, diphtheria, and acellular pertussis (Tdap, Td) vaccine. You may need a Td booster every 10 years. Zoster vaccine. You may need this after age 24. Pneumococcal 13-valent conjugate (PCV13) vaccine. One dose is recommended after age 3. Pneumococcal polysaccharide (PPSV23) vaccine. One dose is recommended after age 42.  Talk to your  health care provider about which screenings and vaccines you need and how often you need them. This information is not intended to replace advice given to you by your health care provider. Make sure you discuss any questions you have with your health care provider. Document Released: 05/13/2015 Document Revised: 01/04/2016 Document Reviewed: 02/15/2015 Elsevier Interactive Patient Education  2017 [employer] Prevention in the Home Falls can cause injuries. They can happen to people of all ages. There are many things you can do to make your home safe and to help prevent falls. What can I do on the outside of my home? Regularly fix the edges of walkways and driveways and fix any cracks. Remove anything that might make you trip as you walk through a door, such as a raised step or threshold. Trim any bushes or trees on the path to your home. Use bright outdoor lighting. Clear any walking paths of anything that might make someone trip, such as rocks or tools. Regularly check to see if handrails are loose or broken. Make sure that both sides of any steps have handrails. Any raised decks and porches should have guardrails on the edges. Have any leaves, snow, or ice cleared regularly. Use sand or salt on walking paths during winter. Clean up any spills in your garage right away. This includes oil or grease spills. What can I do in the bathroom? Use night lights. Install grab bars by the toilet and in the tub and shower. Do not use towel bars as grab bars. Use non-skid mats or decals in the tub or shower. If you need to sit down in the shower, use a plastic, non-slip stool. Keep the floor dry. Clean up any water that spills on the floor as soon as it happens. Remove soap buildup in the tub or shower regularly. Attach bath mats securely with double-sided non-slip rug tape. Do not have throw rugs and other things on the floor that can make you trip. What can I do in the bedroom? Use night  lights. Make sure that you have a light by your bed that is easy to reach. Do not use any sheets or blankets that are too big for your bed. They should not hang down onto the floor. Have a firm chair that has side arms. You can use this for support while you get dressed. Do not have throw rugs and other things on the floor that can make you trip. What can I do in the kitchen? Clean up any spills right away. Avoid walking on wet floors. Keep items that you use a lot in easy-to-reach places. If you need to reach something above you, use a strong step stool that has a grab bar. Keep electrical cords out of the way. Do not use floor polish or wax that makes floors slippery. If you must use wax, use non-skid floor wax. Do not have throw rugs and other things on the floor that can make you trip. What can I do with my stairs? Do not leave any items on the stairs. Make sure that there are handrails on both sides of the stairs and use them. Fix handrails that are broken or loose. Make sure that handrails are as long as the stairways. Check any carpeting to make sure that it is firmly attached to the stairs. Fix any carpet that is loose or worn. Avoid having throw rugs at the top or bottom of the stairs. If you do have throw  rugs, attach them to the floor with carpet tape. Make sure that you have a light switch at the top of the stairs and the bottom of the stairs. If you do not have them, ask someone to add them for you. What else can I do to help prevent falls? Wear shoes that: Do not have high heels. Have rubber bottoms. Are comfortable and fit you well. Are closed at the toe. Do not wear sandals. If you use a stepladder: Make sure that it is fully opened. Do not climb a closed stepladder. Make sure that both sides of the stepladder are locked into place. Ask someone to hold it for you, if possible. Clearly mark and make sure that you can see: Any grab bars or handrails. First and last  steps. Where the edge of each step is. Use tools that help you move around (mobility aids) if they are needed. These include: Canes. Walkers. Scooters. Crutches. Turn on the lights when you go into a dark area. Replace any light bulbs as soon as they burn out. Set up your furniture so you have a clear path. Avoid moving your furniture around. If any of your floors are uneven, fix them. If there are any pets around you, be aware of where they are. Review your medicines with your doctor. Some medicines can make you feel dizzy. This can increase your chance of falling. Ask your doctor what other things that you can do to help prevent falls. This information is not intended to replace advice given to you by your health care provider. Make sure you discuss any questions you have with your health care provider. Document Released: 02/10/2009 Document Revised: 09/22/2015 Document Reviewed: 05/21/2014 Elsevier Interactive Patient Education  2017 [employer].

## 2021-07-10 NOTE — Telephone Encounter (Signed)
Patient advised.

## 2021-07-10 NOTE — Telephone Encounter (Signed)
See staff message re sending in [place] and tessalon please

## 2021-07-10 NOTE — Progress Notes (Signed)
Virtual Visit via Telephone Note  I connected with  Ashley Rodriguez on 07/10/21 at  9:40 AM EDT by telephone and verified that I am speaking with the correct person using two identifiers.  Location: Patient: home Provider: [facility] Persons participating in the virtual visit: [place]   I discussed the limitations, risks, security and privacy concerns of performing an evaluation and management service by telephone and the availability of in person appointments. The patient expressed understanding and agreed to proceed.  Interactive audio and video telecommunications were attempted between this nurse and patient, however failed, due to patient having technical difficulties OR patient did not have access to video capability.  We continued and completed visit with audio only.  Some vital signs may be absent or patient reported.   Dionisio David, LPN  Subjective:   Ashley Rodriguez is a 71 y.o. female who presents for Medicare Annual (Subsequent) preventive examination.  Review of Systems           Objective:    Today's Vitals   07/10/21 0935  Weight: 151 lb (68.5 kg)   Body mass index is 28.53 kg/m.  Advanced Directives 07/04/2020 03/17/2020 12/14/2019 01/22/2019 04/17/2018 04/18/2017 02/11/2017  Does Patient Have a Medical Advance Directive? No No No No No No No  Would patient like information on creating a medical advance directive? No - Patient declined - No - Patient declined - No - Patient declined No - Patient declined -    Current Medications (verified) Outpatient Encounter Medications as of 07/10/2021  Medication Sig   calcium carbonate (OSCAL) 1500 (600 Ca) MG TABS tablet Take by mouth.   acetaminophen (TYLENOL) 325 MG tablet Take 325 mg by mouth every 6 (six) hours as needed.   amLODipine (NORVASC) 5 MG tablet TAKE 1 TABLET BY MOUTH ONCE A DAY AS NEEDED FOR BLOOD PRESSURE   atenolol (TENORMIN) 50 MG tablet TAKE 1 TABLET BY MOUTH ONCE DAILY   betamethasone  dipropionate 0.05 % lotion Apply 1 application topically as needed.    calcium carbonate (OS-CAL) 600 MG TABS tablet Take 600 mg by mouth daily with breakfast.   co-enzyme Q-10 30 MG capsule Take 100 mg by mouth daily.   ENBREL SURECLICK 50 MG/ML injection Inject 50 mg into the skin once a week.    esomeprazole (NEXIUM) 40 MG capsule TAKE 1 CAPSULE BY MOUTH ONCE DAILY   ezetimibe (ZETIA) 10 MG tablet Take 1 tablet (10 mg total) by mouth 3 (three) times a week.   fexofenadine (ALLEGRA) 180 MG tablet Take 180 mg by mouth daily.   FLUAD QUADRIVALENT 0.5 ML injection    fluticasone (FLONASE) 50 MCG/ACT nasal spray PLACE 2 SPRAYS INTO EACH NOSTRIL ONCE DAILY   gabapentin (NEURONTIN) 300 MG capsule Take 2 capsules AM and 3 capsules PM (Patient taking differently: Take 2 capsules AM and 2 capsules PM)   losartan (COZAAR) 25 MG tablet TAKE 1/2 TABLET (12.5 MG TOTAL) BY MOUTHONCE DAILY   meloxicam (MOBIC) 15 MG tablet Take 15 mg by mouth daily.    montelukast (SINGULAIR) 10 MG tablet TAKE 1 TABLET BY MOUTH AT BEDTIME   Multiple Vitamins-Minerals (MULTIVITAMIN WITH MINERALS) tablet Take 1 tablet by mouth daily.   TURMERIC PO Take by mouth daily at 6 (six) AM.   valACYclovir (VALTREX) 1000 MG tablet TAKE 2 TABLETS BY MOUTH TWICE DAILY FOR 1 DAY WHEN FEVER BLISTER OCCURS   Facility-Administered Encounter Medications as of 07/10/2021  Medication   triamcinolone acetonide (KENALOG-40) injection 60 mg  Allergies (verified) Atorvastatin, Sulfamethoxazole-trimethoprim, Codeine, and Sulfa antibiotics   History: Past Medical History:  Diagnosis Date   COVID-19 12/15/2018   Flank lipoma 08/17/2016   Hypertension    Neck mass 04/07/2013   Psoriatic arthritis ([place])    Recurrent cellulitis    T2DM (type 2 diabetes mellitus) ([place]) 10/01/2018   Past Surgical History:  Procedure Laterality Date   ABDOMINAL HYSTERECTOMY  2000   COLONOSCOPY  2015   lipoma removed  04/2012   on neck   NECK MASS EXCISION   04/28/14   lipoma   Family History  Problem Relation Age of Onset   Hypertension Mother    Atrial fibrillation Mother    Cancer Father        bladder   Atrial fibrillation Brother    Supraventricular tachycardia Brother    Arrhythmia Nephew    Breast cancer Neg Hx    Colon cancer Neg Hx    Social History   Socioeconomic History   Marital status: Significant Other    Spouse name: Not on file   Number of children: 1   Years of education: Not on file   Highest education level: Some college, no degree  Occupational History   Occupation: Marine scientist    Comment: retired  Tobacco Use   Smoking status: Never   Smokeless tobacco: Never  Vaping Use   Vaping Use: Never used  Substance and Sexual Activity   Alcohol use: Yes    Alcohol/week: 0.0 - 4.0 standard drinks    Comment: no more han 2 drinks at a time   Drug use: No   Sexual activity: Not on file  Other Topics Concern   Not on file  Social History Narrative   Not on file   Social Determinants of Health   Financial Resource Strain: Not on file  Food Insecurity: Not on file  Transportation Needs: Not on file  Physical Activity: Not on file  Stress: Not on file  Social Connections: Not on file    Tobacco Counseling Counseling given: Not Answered   Clinical Intake:                 Diabetic?yes Nutrition Risk Assessment:  Has the patient had any N/V/D within the last 2 months?  No  Does the patient have any non-healing wounds?  No  Has the patient had any unintentional weight loss or weight gain?  No   Diabetes:  Is the patient diabetic?  Yes  If diabetic, was a CBG obtained today?  No  Did the patient bring in their glucometer from home?  No  How often do you monitor your CBG's? never.   Financial Strains and Diabetes Management:  Are you having any financial strains with the device, your supplies or your medication? No .  Does the patient want to be seen by Chronic Care  Management for management of their diabetes?  No  Would the patient like to be referred to a Nutritionist or for Diabetic Management?  No   Diabetic Exams:  Diabetic Eye Exam: Completed 09/14/20.   Diabetic Foot Exam: Completed 03/17/21. Pt has been advised about the importance in completing this exam.        Activities of Daily Living In your present state of health, do you have any difficulty performing the following activities: 07/04/2021 03/17/2021  Hearing? - Y  Vision? N N  Difficulty concentrating or making decisions? Tempie Donning  Walking or climbing stairs? Y N  Dressing  or bathing? N N  Doing errands, shopping? N N  Some recent data might be hidden    Patient Care Team: Virginia Crews, MD as PCP - General (Family Medicine) Anell Barr, [place] (Optometry) Milinda Pointer, MD as Referring Physician (Pain Medicine)  Indicate any recent Medical Services you may have received from other than [facility] providers in the past year (date may be approximate).     Assessment:   This is a routine wellness examination for Ashley Rodriguez.  Hearing/Vision screen No results found.  Dietary issues and exercise activities discussed:     Goals Addressed   None    Depression Screen PHQ 2/9 Scores 07/04/2021 05/18/2021 07/04/2020 01/26/2020 12/14/2019 08/06/2019 07/02/2019  PHQ - 2 Score 0 0 0 0 0 0 0  PHQ- 9 Score 4 - - 4 - - 4    Fall Risk Fall Risk  07/04/2021 05/18/2021 05/03/2021 03/17/2021 07/04/2020  Falls in the past year? 0 0 0 0 0  Number falls in past yr: 0 - - 0 0  Injury with Fall? 0 - - 0 0  Risk for fall due to : No Fall Risks - - - -  Risk for fall due to: Comment - - - - -  Follow up Falls evaluation completed - - - -    FALL RISK PREVENTION PERTAINING TO THE HOME:  Any stairs in or around the home? Yes  If so, are there any without handrails? No  Home free of loose throw rugs in walkways, pet beds, electrical cords, etc? Yes  Adequate lighting in your home to reduce risk of  falls? Yes   ASSISTIVE DEVICES UTILIZED TO PREVENT FALLS:  Life alert? No  Use of a cane, walker or w/c? No  Grab bars in the bathroom? No  Shower chair or bench in shower? Yes  Elevated toilet seat or a handicapped toilet? No   Cognitive Function:    6CIT Screen 07/02/2019  What Year? 0 points  What month? 0 points  What time? 0 points  Count back from 20 0 points  Months in reverse 0 points  Repeat phrase 0 points  Total Score 0    Immunizations Immunization History  Administered Date(s) Administered   Fluad Quad(high Dose 65+) 01/01/2019, 01/25/2020   Influenza-Unspecified 01/13/2021   PFIZER(Purple Top)SARS-COV-2 Vaccination 06/15/2019, 07/06/2019, 02/15/2020   Pneumococcal Conjugate-13 09/20/2014   Pneumococcal Polysaccharide-23 01/09/2012, 03/13/2018   Td 09/08/2013   Tdap 12/31/2005    TDAP status: Up to date  Flu Vaccine status: Up to date  Pneumococcal vaccine status: Up to date  Covid-19 vaccine status: Completed vaccines  Qualifies for Shingles Vaccine? Yes   Zostavax completed Yes   Shingrix Completed?: Yes  Screening Tests Health Maintenance  Topic Date Due   Zoster Vaccines- Shingrix (1 of 2) Never done   COVID-19 Vaccine (4 - Booster for Pfizer series) 04/11/2020   OPHTHALMOLOGY EXAM  09/14/2021   HEMOGLOBIN A1C  01/04/2022   FOOT EXAM  03/17/2022   MAMMOGRAM  04/04/2023   TETANUS/TDAP  09/09/2023   DEXA SCAN  11/11/2023   COLONOSCOPY (Pts 45-16yr Insurance coverage will need to be confirmed)  02/27/2024   Pneumonia Vaccine 71 Years old  Completed   INFLUENZA VACCINE  Completed   Hepatitis C Screening  Completed   HPV VACCINES  Aged Out    Health Maintenance  Health Maintenance Due  Topic Date Due   Zoster Vaccines- Shingrix (1 of 2) Never done   COVID-19 Vaccine (4 -  Booster for [employer] series) 04/11/2020    Colorectal cancer screening: Type of screening: Colonoscopy. Completed 02/26/14. Repeat every 10 years  Mammogram status:  Completed 04/03/21. Repeat every year  Bone Density status: Completed 11/01/18. Results reflect: Bone density results: NORMAL. Repeat every 5 years.  Lung Cancer Screening: (Low Dose CT Chest recommended if Age 33-80 years, 30 pack-year currently smoking OR have quit w/in 15years.) does not qualify.   Additional Screening:  Hepatitis C Screening: does qualify; Completed 01/01/19  Vision Screening: Recommended annual ophthalmology exams for early detection of glaucoma and other disorders of the eye. Is the patient up to date with their annual eye exam?  Yes  Who is the provider or what is the name of the office in which the patient attends annual eye exams? Dr.Woodard If pt is not established with a provider, would they like to be referred to a provider to establish care? No .   Dental Screening: Recommended annual dental exams for proper oral hygiene  Community Resource Referral / Chronic Care Management: CRR required this visit?  No   CCM required this visit?  No      Plan:     I have personally reviewed and noted the following in the patients chart:   Medical and social history Use of alcohol, tobacco or illicit drugs  Current medications and supplements including opioid prescriptions.  Functional ability and status Nutritional status Physical activity Advanced directives List of other physicians Hospitalizations, surgeries, and ER visits in previous 12 months Vitals Screenings to include cognitive, depression, and falls Referrals and appointments  In addition, I have reviewed and discussed with patient certain preventive protocols, quality metrics, and best practice recommendations. A written personalized care plan for preventive services as well as general preventive health recommendations were provided to patient.     Dionisio David, LPN   01/12/7828   Nurse Notes: none

## 2021-07-10 NOTE — Telephone Encounter (Signed)
?  Summary: cough  ? Pt called in stating she recently spoke with a nurse about her cough, stating its not getting better even after taking the mucinex, pt state if she can speak with a nurse, and see what's the strongest cough medicine she can take, pt really does not feel well. Please advise.   ?  ? ? ? ?Chief Complaint: requesting medication stronger than OTC for cough ?Symptoms: productive cough, yellow sputum. Blow nose some blood noted. Constant cough, headache ?Frequency: started Monday night seen in office Tuesday  ?Pertinent Negatives: Patient denies chest pain, difficulty breathing , no fever. ?Disposition: '[]'$ ED /'[x]'$ Urgent Care (no appt availability in office) / '[]'$ Appointment(In office/virtual)/ '[]'$  [facility]/ '[]'$ Home Care/ '[x]'$ Refused Recommended Disposition /'[]'$ [facility]/ '[x]'$  Follow-up with PCP ?Additional Notes:   ?No available appt noted within 24 hours. Patient last seen Tuesday 07/04/21 in office. Patient does not want to go to UC and requesting medication for continued cough as reported last week. Please advise . Requesting a call back. ? ? ?Reason for Disposition ? [1] Continuous (nonstop) coughing interferes with work or school AND [2] no improvement using cough treatment per Care Advice ? ?Answer Assessment - Initial Assessment Questions ?1. ONSET: "When did the cough begin?"  ?    Monday night x 1 week ago  ?2. SEVERITY: "How bad is the cough today?"  ?    Not bad but mucinex has not helped with constant cough ?3. SPUTUM: "Describe the color of your sputum" (none, dry cough; clear, white, yellow, green) ?    Yellow ?4. HEMOPTYSIS: "Are you coughing up any blood?" If so ask: "How much?" (flecks, streaks, tablespoons, etc.) ?    No . ?5. DIFFICULTY BREATHING: "Are you having difficulty breathing?" If Yes, ask: "How bad is it?" (e.g., mild, moderate, severe)  ?  - MILD: No SOB at rest, mild SOB with walking, speaks normally in sentences, can lie down, no retractions, pulse <  100.  ?  - MODERATE: SOB at rest, SOB with minimal exertion and prefers to sit, cannot lie down flat, speaks in phrases, mild retractions, audible wheezing, pulse 100-120.  ?  - SEVERE: Very SOB at rest, speaks in single words, struggling to breathe, sitting hunched forward, retractions, pulse > 120  ?    no ?6. FEVER: "Do you have a fever?" If Yes, ask: "What is your temperature, how was it measured, and when did it start?" ?    No  ?7. CARDIAC HISTORY: "Do you have any history of heart disease?" (e.g., heart attack, congestive heart failure)  ?    na ?8. LUNG HISTORY: "Do you have any history of lung disease?"  (e.g., pulmonary embolus, asthma, emphysema) ?    na ?9. PE RISK FACTORS: "Do you have a history of blood clots?" (or: recent major surgery, recent prolonged travel, bedridden) ?    na ?10. OTHER SYMPTOMS: "Do you have any other symptoms?" (e.g., runny nose, wheezing, chest pain) ?      Runny nose , cough, blow nose with some blood tinged , and headache  ?11. PREGNANCY: "Is there any chance you are pregnant?" "When was your last menstrual period?" ?      na ?12. TRAVEL: "Have you traveled out of the country in the last month?" (e.g., travel history, exposures) ?      na ? ?Protocols used: Cough - Acute Productive-A-AH ? ?

## 2021-07-11 MED ORDER — BENZONATATE 100 MG PO CAPS: 100.0000 mg | ORAL_CAPSULE | Freq: Two times a day (BID) | ORAL | 0 refills | Status: DC

## 2021-07-17 ENCOUNTER — Ambulatory Visit (INDEPENDENT_AMBULATORY_CARE_PROVIDER_SITE_OTHER): Payer: Medicare Other | Admitting: Physician Assistant

## 2021-07-17 ENCOUNTER — Encounter: Payer: Self-pay | Admitting: Physician Assistant

## 2021-07-17 ENCOUNTER — Other Ambulatory Visit: Payer: Self-pay

## 2021-07-17 VITALS — BP 126/71 | HR 60 | Temp 98.1°F | Wt 156.0 lb

## 2021-07-17 DIAGNOSIS — R0982 Postnasal drip: Secondary | ICD-10-CM | POA: Diagnosis not present

## 2021-07-17 DIAGNOSIS — K219 Gastro-esophageal reflux disease without esophagitis: Secondary | ICD-10-CM | POA: Diagnosis not present

## 2021-07-17 DIAGNOSIS — R051 Acute cough: Secondary | ICD-10-CM | POA: Diagnosis not present

## 2021-07-17 DIAGNOSIS — J309 Allergic rhinitis, unspecified: Secondary | ICD-10-CM | POA: Diagnosis not present

## 2021-07-17 NOTE — Progress Notes (Signed)
?  ? ? ?I,Julita Ozbun Robinson,acting as a Education administrator for [employer], PA-C.,have documented all relevant documentation on the behalf of Mardene Speak, PA-C,as directed by  [employer], PA-C while in the presence of [employer], PA-C.  ?Established patient visit ? ? ?Patient: Ashley Rodriguez   DOB: Dec 29, 1950   71 y.o. Female  MRN: [id number] ?Visit Date: 07/17/2021 ? ?Today's healthcare provider: Mardene Speak, PA-C  ? ?Chief Complaint  ?Patient presents with  ? Cough  ? ?Subjective  ?  ? ?Cough ?This is a new problem. The current episode started 2 weeks ago. The problem has been gradually improving. The problem occurs every few hours, worse at night. The cough is Productive of sputum. Associated symptoms include nasal congestion and postnasal drip. Has hx of allergic rhinitis with postnasal drip. Pertinent negatives include no chest pain, chills, ear congestion, ear pain, fever, headaches, heartburn, hemoptysis, myalgias, postnasal drip, rash, rhinorrhea, sore throat, shortness of breath, sweats, weight loss or wheezing. The symptoms are aggravated by lying down. Per chart review, she was prescribed Tessalon pearls, Zithromax  on 07/11/2021 . Patient states that the treatment provided moderate relief. There is no history of asthma, bronchiectasis, bronchitis, COPD, emphysema, environmental allergies or pneumonia.  ? ?Patient has indigestion and taking Nexium for control of her symptoms. ? ?Medications: ?Outpatient Medications Prior to Visit  ?Medication Sig  ? acetaminophen (TYLENOL) 325 MG tablet Take 325 mg by mouth every 6 (six) hours as needed.  ? amLODipine (NORVASC) 5 MG tablet TAKE 1 TABLET BY MOUTH ONCE A DAY AS NEEDED FOR BLOOD PRESSURE  ? atenolol (TENORMIN) 50 MG tablet TAKE 1 TABLET BY MOUTH ONCE DAILY  ? betamethasone dipropionate 0.05 % lotion Apply 1 application topically as needed.   ? calcium carbonate (OS-CAL) 600 MG TABS tablet Take 600 mg by mouth daily with breakfast.  ? calcium carbonate (OSCAL) 1500  (600 Ca) MG TABS tablet Take by mouth.  ? co-enzyme Q-10 30 MG capsule Take 100 mg by mouth daily.  ? ENBREL SURECLICK 50 MG/ML injection Inject 50 mg into the skin once a week.   ? esomeprazole (NEXIUM) 40 MG capsule TAKE 1 CAPSULE BY MOUTH ONCE DAILY  ? ezetimibe (ZETIA) 10 MG tablet Take 1 tablet (10 mg total) by mouth 3 (three) times a week.  ? fexofenadine (ALLEGRA) 180 MG tablet Take 180 mg by mouth daily.  ? FLUAD QUADRIVALENT 0.5 ML injection   ? fluticasone (FLONASE) 50 MCG/ACT nasal spray PLACE 2 SPRAYS INTO EACH NOSTRIL ONCE DAILY  ? gabapentin (NEURONTIN) 300 MG capsule Take 2 capsules AM and 3 capsules PM (Patient taking differently: Take 2 capsules AM and 2 capsules PM)  ? losartan (COZAAR) 25 MG tablet TAKE 1/2 TABLET (12.5 MG TOTAL) BY MOUTHONCE DAILY  ? meloxicam (MOBIC) 15 MG tablet Take 15 mg by mouth daily.   ? montelukast (SINGULAIR) 10 MG tablet TAKE 1 TABLET BY MOUTH AT BEDTIME  ? Multiple Vitamins-Minerals (MULTIVITAMIN WITH MINERALS) tablet Take 1 tablet by mouth daily.  ? TURMERIC PO Take by mouth daily at 6 (six) AM.  ? valACYclovir (VALTREX) 1000 MG tablet TAKE 2 TABLETS BY MOUTH TWICE DAILY FOR 1 DAY WHEN FEVER BLISTER OCCURS  ? benzonatate (TESSALON PERLES) 100 MG capsule Take 1 capsule (100 mg total) by mouth 2 (two) times daily. (Patient not taking: Reported on 07/17/2021)  ? ?Facility-Administered Medications Prior to Visit  ?Medication Dose Route Frequency Provider  ? triamcinolone acetonide (KENALOG-40) injection 60 mg  60 mg Other Once  [place], Max T, DPM  ? ? ?Review of Systems  ?Constitutional:  Negative for chills and fever.  ?HENT:  Negative for ear pain, postnasal drip, rhinorrhea and sore throat.   ?Respiratory:  Positive for cough. Negative for shortness of breath and wheezing.   ?Cardiovascular:  Negative for chest pain.  ?Musculoskeletal:  Negative for myalgias.  ?Skin:  Negative for rash.  ?Allergic/Immunologic: Negative for environmental allergies.  ?Neurological:   Negative for headaches.  ? ?  Objective  ?  ?BP 126/71 (BP Location: Right Arm, Patient Position: Sitting, Cuff Size: Normal)   Pulse 60   Temp 98.1 ?F (36.7 ?C) (Oral)   Wt 156 lb (70.8 kg)   SpO2 96%   BMI 29.48 kg/m?  ? ? ?Physical Exam ?Vitals and nursing note reviewed.  ?Constitutional:   ?   General: She is in acute distress.  ?   Appearance: Normal appearance.  ?HENT:  ?   Head: Normocephalic and atraumatic.  ?   Right Ear: Tympanic membrane normal.  ?   Left Ear: Tympanic membrane normal.  ?   Nose: Congestion and rhinorrhea present.  ?   Mouth/Throat:  ?   Mouth: Mucous membranes are moist.  ?   Pharynx: Oropharynx is clear.  ?Eyes:  ?   Conjunctiva/sclera: Conjunctivae normal.  ?   Pupils: Pupils are equal, round, and reactive to light.  ?Cardiovascular:  ?   Rate and Rhythm: Normal rate and regular rhythm.  ?   Pulses: Normal pulses.  ?   Heart sounds: Normal heart sounds.  ?Pulmonary:  ?   Effort: Pulmonary effort is normal.  ?   Breath sounds: Normal breath sounds.  ?Abdominal:  ?   General: Abdomen is flat. Bowel sounds are normal.  ?   Palpations: Abdomen is soft.  ?Neurological:  ?   General: No focal deficit present.  ?   Mental Status: She is alert and oriented to person, place, and time.  ?Psychiatric:     ?   Behavior: Behavior normal.     ?   Thought Content: Thought content normal.     ?   Judgment: Judgment normal.  ?  ? ?No results found for any visits on 07/17/21. ? Assessment & Plan  ?  ? ? ?1. Acute cough ?Vitals are stable: no fever, tachypnea, tachycardia ?Continue Tessalon Perles, and her regular medications for Allergy: Singulair and  ?OTC antihistamines. ?Discussed expectation for duration of symptoms; discussed that she may feel bad for up to a week but sometimes symptoms last longer ?Made plan with the patient to outline what to do if symptoms worsen or do not improve, or if they develop concerning symptoms like high fever, shortness of breath. ? ?2. Allergic rhinitis with  postnasal drip ?Continue Flonase, Singulair and OTC antihistamine ? ?3. Gastroesophageal reflux disease, unspecified whether esophagitis present ?Continue Nexium ?Elevate the head of the bed 6-8 inches, avoid recumbency for 3 hours after eating, avoid food as a delayed gastric emptying, weight loss  ?The patient was advised to call back or seek an in-person evaluation if the symptoms worsen or if the condition fails to improve as anticipated. ? ?I discussed the assessment and treatment plan with the patient. The patient was provided an opportunity to ask questions and all were answered. The patient agreed with the plan and demonstrated an understanding of the instructions. ? ?The entirety of the information documented in the History of Present Illness, Review of Systems and Physical Exam were personally obtained  by me. Portions of this information were initially documented by the CMA and reviewed by me for thoroughness and accuracy.   ? ? ?Mardene Speak, PA-C  ?[place] ?[phone] (phone) ?[phone] (fax) ? ?[facility] ?

## 2021-07-18 DIAGNOSIS — M9903 Segmental and somatic dysfunction of lumbar region: Secondary | ICD-10-CM | POA: Diagnosis not present

## 2021-07-18 DIAGNOSIS — M9901 Segmental and somatic dysfunction of cervical region: Secondary | ICD-10-CM | POA: Diagnosis not present

## 2021-07-18 DIAGNOSIS — M542 Cervicalgia: Secondary | ICD-10-CM | POA: Diagnosis not present

## 2021-07-18 DIAGNOSIS — M5416 Radiculopathy, lumbar region: Secondary | ICD-10-CM | POA: Diagnosis not present

## 2021-07-23 NOTE — Progress Notes (Deleted)
?  ? ? ?Established patient visit ? ? ?Patient: Ashley Rodriguez   DOB: 1950/08/08   71 y.o. Female  MRN: [id number] ?Visit Date: 07/24/2021 ? ?Today's healthcare provider: Mardene Speak, PA-C  ? ?No chief complaint on file. ? ?Subjective  ?  ?HPI  ? ?1. Acute cough ?Vitals are stable: no fever, tachypnea, tachycardia ?Continue Tessalon Perles, and her regular medications for Allergy: Singulair and  ?OTC antihistamines. ?Discussed expectation for duration of symptoms; discussed that she may feel bad for up to a week but sometimes symptoms last longer ?Made plan with the patient to outline what to do if symptoms worsen or do not improve, or if they develop concerning symptoms like high fever, shortness of breath. ?  ?2. Allergic rhinitis with postnasal drip ?Continue Flonase, Singulair and OTC antihistamine ?  ?3. Gastroesophageal reflux disease, unspecified whether esophagitis present ?Continue Nexium ?Elevate the head of the bed 6-8 inches, avoid recumbency for 3 hours after eating, avoid food as a delayed gastric emptying, weight loss  ?The patient was advised to call back or seek an in-person evaluation if the symptoms worsen or if the condition fails to improve as anticipated. ? ?Medications: ?Outpatient Medications Prior to Visit  ?Medication Sig  ? acetaminophen (TYLENOL) 325 MG tablet Take 325 mg by mouth every 6 (six) hours as needed.  ? amLODipine (NORVASC) 5 MG tablet TAKE 1 TABLET BY MOUTH ONCE A DAY AS NEEDED FOR BLOOD PRESSURE  ? atenolol (TENORMIN) 50 MG tablet TAKE 1 TABLET BY MOUTH ONCE DAILY  ? benzonatate (TESSALON PERLES) 100 MG capsule Take 1 capsule (100 mg total) by mouth 2 (two) times daily. (Patient not taking: Reported on 07/17/2021)  ? betamethasone dipropionate 0.05 % lotion Apply 1 application topically as needed.   ? calcium carbonate (OS-CAL) 600 MG TABS tablet Take 600 mg by mouth daily with breakfast.  ? calcium carbonate (OSCAL) 1500 (600 Ca) MG TABS tablet Take by mouth.  ? co-enzyme  Q-10 30 MG capsule Take 100 mg by mouth daily.  ? ENBREL SURECLICK 50 MG/ML injection Inject 50 mg into the skin once a week.   ? esomeprazole (NEXIUM) 40 MG capsule TAKE 1 CAPSULE BY MOUTH ONCE DAILY  ? ezetimibe (ZETIA) 10 MG tablet Take 1 tablet (10 mg total) by mouth 3 (three) times a week.  ? fexofenadine (ALLEGRA) 180 MG tablet Take 180 mg by mouth daily.  ? FLUAD QUADRIVALENT 0.5 ML injection   ? fluticasone (FLONASE) 50 MCG/ACT nasal spray PLACE 2 SPRAYS INTO EACH NOSTRIL ONCE DAILY  ? gabapentin (NEURONTIN) 300 MG capsule Take 2 capsules AM and 3 capsules PM (Patient taking differently: Take 2 capsules AM and 2 capsules PM)  ? losartan (COZAAR) 25 MG tablet TAKE 1/2 TABLET (12.5 MG TOTAL) BY MOUTHONCE DAILY  ? meloxicam (MOBIC) 15 MG tablet Take 15 mg by mouth daily.   ? montelukast (SINGULAIR) 10 MG tablet TAKE 1 TABLET BY MOUTH AT BEDTIME  ? Multiple Vitamins-Minerals (MULTIVITAMIN WITH MINERALS) tablet Take 1 tablet by mouth daily.  ? TURMERIC PO Take by mouth daily at 6 (six) AM.  ? valACYclovir (VALTREX) 1000 MG tablet TAKE 2 TABLETS BY MOUTH TWICE DAILY FOR 1 DAY WHEN FEVER BLISTER OCCURS  ? ?Facility-Administered Medications Prior to Visit  ?Medication Dose Route Frequency Provider  ? triamcinolone acetonide (KENALOG-40) injection 60 mg  60 mg Other Once [place], Max T, DPM  ? ? ?Review of Systems ? ?{Labs  Heme  Chem  Endocrine  Serology  Results  Review (optional):23779} ?  Objective  ?  ?There were no vitals taken for this visit. ?{Show previous vital signs (optional):23777} ? ?Physical Exam  ?*** ? ?No results found for any visits on 07/24/21. ? Assessment & Plan  ?  ? ?*** ? ?No follow-ups on file.  ?   ? ?{provider attestation***:1} ? ? ?Mardene Speak, PA-C  ?[place] ?[phone] (phone) ?[phone] (fax) ? ?[facility] ? ?

## 2021-07-24 ENCOUNTER — Ambulatory Visit: Payer: Medicare Other | Admitting: Physician Assistant

## 2021-07-31 ENCOUNTER — Other Ambulatory Visit: Payer: Self-pay | Admitting: Family Medicine

## 2021-08-08 DIAGNOSIS — M9903 Segmental and somatic dysfunction of lumbar region: Secondary | ICD-10-CM | POA: Diagnosis not present

## 2021-08-08 DIAGNOSIS — M542 Cervicalgia: Secondary | ICD-10-CM | POA: Diagnosis not present

## 2021-08-08 DIAGNOSIS — M5416 Radiculopathy, lumbar region: Secondary | ICD-10-CM | POA: Diagnosis not present

## 2021-08-08 DIAGNOSIS — M9901 Segmental and somatic dysfunction of cervical region: Secondary | ICD-10-CM | POA: Diagnosis not present

## 2021-08-29 ENCOUNTER — Other Ambulatory Visit: Payer: Self-pay | Admitting: Podiatry

## 2021-09-04 DIAGNOSIS — M5416 Radiculopathy, lumbar region: Secondary | ICD-10-CM | POA: Diagnosis not present

## 2021-09-04 DIAGNOSIS — M9903 Segmental and somatic dysfunction of lumbar region: Secondary | ICD-10-CM | POA: Diagnosis not present

## 2021-09-04 DIAGNOSIS — M9901 Segmental and somatic dysfunction of cervical region: Secondary | ICD-10-CM | POA: Diagnosis not present

## 2021-09-04 DIAGNOSIS — M542 Cervicalgia: Secondary | ICD-10-CM | POA: Diagnosis not present

## 2021-10-02 DIAGNOSIS — M5416 Radiculopathy, lumbar region: Secondary | ICD-10-CM | POA: Diagnosis not present

## 2021-10-02 DIAGNOSIS — M9903 Segmental and somatic dysfunction of lumbar region: Secondary | ICD-10-CM | POA: Diagnosis not present

## 2021-10-02 DIAGNOSIS — M542 Cervicalgia: Secondary | ICD-10-CM | POA: Diagnosis not present

## 2021-10-02 DIAGNOSIS — M9901 Segmental and somatic dysfunction of cervical region: Secondary | ICD-10-CM | POA: Diagnosis not present

## 2021-10-03 NOTE — Progress Notes (Unsigned)
PROVIDER NOTE: Information contained herein reflects review and annotations entered in association with encounter. Interpretation of such information and data should be left to medically-trained personnel. Information provided to patient can be located elsewhere in the medical record under "Patient Instructions". Document created using STT-dictation technology, any transcriptional errors that may result from process are unintentional.    Patient: Ashley Rodriguez  Service Category: E/M  Provider: Gaspar Cola, MD  DOB: [birth year]  DOS: 10/04/2021  Specialty: Interventional Pain Management  MRN: [id number]  Setting: Ambulatory outpatient  PCP: Virginia Crews, MD  Type: Established Patient    Referring Provider: Virginia Crews, MD  Location: Office  Delivery: Face-to-face     HPI  [name], a 71 y.o. year old female, is here today because of her No primary diagnosis found.. [name] primary complain today is No chief complaint on file. Last encounter: My last encounter with her was on 06/06/2021. Pertinent problems: [name] has Psoriatic arthritis (Ashley Rodriguez); Chronic low back pain (1ry area of Pain) (Bilateral) (R>L) w/ sciatica (Bilateral); Chronic lower extremity pain (2ry area of Pain) (Bilateral) (R>L); Grade 1 Anterolisthesis of L5 over S1; L5 pars defect with spondylolisthesis (Bilateral); DDD (degenerative disc disease), lumbar; Lumbar facet syndrome (Bilateral) (R>L); Chronic pain syndrome; Chronic musculoskeletal pain; Neurogenic pain; Chronic sacroiliac joint pain (Bilateral) (R>L); Osteoarthritis of lumbar spine; Spondylosis without myelopathy or radiculopathy, lumbosacral region; Peripheral neuropathy; Generalized osteoarthritis of hand; Numbness and tingling of foot; Low back pain radiating to both legs; Abnormal EMG (chronic right lower lumbar polyradiculopathy); Chronic lower lumbar polyradiculopathy (Right); DDD (degenerative disc disease), lumbosacral; Lumbar  spondylosis w/ polyradiculopathy (Right); Lumbosacral radiculopathy (Right); Other intervertebral disc degeneration, lumbar region; Spondylolisthesis of lumbosacral region; Other spondylosis, sacral and sacrococcygeal region; Somatic dysfunction of sacroiliac joints (Bilateral); Osteoarthritis of sacroiliac joints (Bilateral); Myalgia due to statin; Chronic sacroiliac joint pain (Right); Osteoarthritis of sacroiliac joint ([place]) (Right); Enthesopathy of sacroiliac joint; Chronic low back pain (Bilateral) w/o sciatica; Chronic sacroiliac joint pain (Left); Osteoarthritis of sacroiliac joint (Left) ([place]); Somatic dysfunction of sacroiliac joint (Left); and Sciatica, left side on their pertinent problem list. Pain Assessment: Severity of   is reported as a  /10. Location:    / . Onset:  . Quality:  . Timing:  . Modifying factor(s):  Mar[place] Vitals:  vitals were not taken for this visit.   Reason for encounter:  *** . ***  Pharmacotherapy Assessment  Analgesic: No opioid analgesics from our practice.   Monitoring: [place] PMP: PDMP reviewed during this encounter.       Pharmacotherapy: No side-effects or adverse reactions reported. Compliance: No problems identified. Effectiveness: Clinically acceptable.  No notes on file  UDS:  No results found for: SUMMARY   ROS  Constitutional: Denies any fever or chills Gastrointestinal: No reported hemesis, hematochezia, vomiting, or acute GI distress Musculoskeletal: Denies any acute onset joint swelling, redness, loss of ROM, or weakness Neurological: No reported episodes of acute onset apraxia, aphasia, dysarthria, agnosia, amnesia, paralysis, loss of coordination, or loss of consciousness  Medication Review  Turmeric, acetaminophen, amLODipine, atenolol, benzonatate, betamethasone dipropionate, calcium carbonate, co-enzyme Q-10, esomeprazole, etanercept, ezetimibe, fexofenadine, fluticasone, gabapentin, influenza vaccine adjuvanted, losartan, meloxicam,  montelukast, multivitamin with minerals, and valACYclovir  History Review  Allergy: [name] is allergic to atorvastatin, sulfamethoxazole-trimethoprim, codeine, and sulfa antibiotics. Drug: [name]  reports no history of drug use. Alcohol:  reports current alcohol use. Tobacco:  reports that she has never smoked. She has never used smokeless tobacco. Social: Ms.  Ashley Rodriguez  reports that she has never smoked. She has never used smokeless tobacco. She reports current alcohol use. She reports that she does not use drugs. Medical:  has a past medical history of COVID-19 (12/15/2018), Flank lipoma (08/17/2016), Hypertension, Neck mass (04/07/2013), Psoriatic arthritis (HCC), Recurrent cellulitis, and T2DM (type 2 diabetes mellitus) (HCC) (10/01/2018). Surgical: [name]  has a past surgical history that includes Abdominal hysterectomy (2000); lipoma removed (04/2012); Colonoscopy (2015); and Neck mass excision (04/28/14). Family: family history includes Arrhythmia in her nephew; Atrial fibrillation in her brother and mother; Cancer in her father; Hypertension in her mother; Supraventricular tachycardia in her brother.  Laboratory Chemistry Profile   Renal Lab Results  Component Value Date   BUN 12 07/04/2021   CREATININE 0.71 07/04/2021   BCR 17 07/04/2021   GFRAA 94 05/17/2020   GFRNONAA 81 05/17/2020    Hepatic Lab Results  Component Value Date   AST 28 03/17/2021   ALT 26 03/17/2021   ALBUMIN 5.1 (H) 03/17/2021   ALKPHOS 75 03/17/2021    Electrolytes Lab Results  Component Value Date   NA 141 07/04/2021   K 5.0 07/04/2021   CL 101 07/04/2021   CALCIUM 10.1 07/04/2021   MG 2.1 01/03/2017    Bone Lab Results  Component Value Date   25OHVITD1 50 01/03/2017   25OHVITD2 <1.0 01/03/2017   25OHVITD3 50 01/03/2017    Inflammation (CRP: Acute Phase) (ESR: Chronic Phase) Lab Results  Component Value Date   CRP 0.8 01/03/2017   ESRSEDRATE 2 01/25/2020         Note: Above Lab  results reviewed.  Recent Imaging Review  DG PAIN CLINIC C-ARM 1-60 MIN NO REPORT Fluoro was used, but no Radiologist interpretation will be provided.  Please refer to "NOTES" tab for provider progress note. Note: Reviewed        Physical Exam  General appearance: Well nourished, well developed, and well hydrated. In no apparent acute distress Mental status: Alert, oriented x 3 (person, place, & time)       Respiratory: No evidence of acute respiratory distress Eyes: PERLA Vitals: There were no vitals taken for this visit. BMI: Estimated body mass index is 29.48 kg/m as calculated from the following:   Height as of 05/18/21: 5' 1" (1.549 m).   Weight as of 07/17/21: 156 lb (70.8 kg). Ideal: Patient weight not recorded  Assessment   Diagnosis Status  No diagnosis found. Controlled Controlled Controlled   Updated Problems: No problems updated.  Plan of Care  Problem-specific:  No problem-specific Assessment & Plan notes found for this encounter.  Ms. Ayse Wurzel has a current medication list which includes the following long-term medication(s): amlodipine, atenolol, calcium carbonate, calcium carbonate, enbrel sureclick, esomeprazole, ezetimibe, fexofenadine, fluticasone, gabapentin, losartan, and montelukast.  Pharmacotherapy (Medications Ordered): No orders of the defined types were placed in this encounter.  Orders:  No orders of the defined types were placed in this encounter.  Follow-up plan:   No follow-ups on file.     Interventional Therapies  Risk  Complexity Considerations:   WNL   Planned  Pending:      Under consideration:   Therapeutic/palliative bilateral lumbar facet + sacroiliac joint RFA #2    Completed:   Diagnostic/therapeutic right L4-5 LESI x1 (01/22/2019) (50/50/90 x2 weeks)  Diagnostic bilateral SI Blk x2 (02/09/2020) (100/100/80) (100/100/100/50)  Therapeutic right SI RFA x1 (03/17/2020) (100/100/100/90-100)  Therapeutic left SI RFA x1  (03/31/2020) (100/100/100 x3 weeks/70)  Diagnostic bilateral lumbar facet   MBB x2 (01/22/2017; 04/18/2017) (100/100/90/90) (100/90/100/100)  Palliative bilateral lumbar facet MBB x3 (10/09/2018) (100/100/95/90-100/duration of up to 8 months)  Therapeutic right lumbar facet RFA x1 (07/21/2019) (100/100/75)  Therapeutic left lumbar facet RFA x1 (08/06/2019)  (100/100/75)  Therapeutic bilateral lumbar facet and SI joint Blk x1 (05/18/2021) (100/100/100) patient states that the combination works better than individually.   Palliative options:   Palliative lumbar facet MBB  Therapeutic lumbar facet RFA  Therapeutic SI RFA  Palliative SI joint Blk  Therapeutic L4-5 LESI      Recent Visits No visits were found meeting these conditions. Showing recent visits within past 90 days and meeting all other requirements Future Appointments Date Type Provider Dept  10/04/21 Appointment Milinda Pointer, MD [facility]  Showing future appointments within next 90 days and meeting all other requirements  I discussed the assessment and treatment plan with the patient. The patient was provided an opportunity to ask questions and all were answered. The patient agreed with the plan and demonstrated an understanding of the instructions.  Patient advised to call back or seek an in-person evaluation if the symptoms or condition worsens.  Duration of encounter: *** minutes.  Note by: Gaspar Cola, MD Date: 10/04/2021; Time: 4:48 PM

## 2021-10-04 ENCOUNTER — Ambulatory Visit: Payer: Medicare Other | Admitting: Pain Medicine

## 2021-10-04 ENCOUNTER — Ambulatory Visit (HOSPITAL_BASED_OUTPATIENT_CLINIC_OR_DEPARTMENT_OTHER): Payer: Medicare Other | Admitting: Pain Medicine

## 2021-10-04 DIAGNOSIS — Z91199 Patient's noncompliance with other medical treatment and regimen due to unspecified reason: Secondary | ICD-10-CM

## 2021-10-08 NOTE — Progress Notes (Unsigned)
PROVIDER NOTE: Information contained herein reflects review and annotations entered in association with encounter. Interpretation of such information and data should be left to medically-trained personnel. Information provided to patient can be located elsewhere in the medical record under "Patient Instructions". Document created using STT-dictation technology, any transcriptional errors that may result from process are unintentional.    Patient: Ashley Rodriguez  Service Category: E/M  Provider: Francisco A Naveira, MD  DOB: 05/27/1950  DOS: 10/09/2021  Specialty: Interventional Pain Management  MRN: [id number]  Setting: Ambulatory outpatient  PCP: Bacigalupo, Angela M, MD  Type: Established Patient    Referring Provider: Bacigalupo, Angela M, MD  Location: Office  Delivery: Face-to-face     HPI  Ashley Rodriguez, a 70 y.o. year old female, is here today because of her No primary diagnosis found.. Ashley Rodriguez's primary complain today is No chief complaint on file. Last encounter: My last encounter with her was on 10/04/2021. Pertinent problems: Ashley Rodriguez has Psoriatic arthritis (HCC); Chronic low back pain (1ry area of Pain) (Bilateral) (R>L) w/ sciatica (Bilateral); Chronic lower extremity pain (2ry area of Pain) (Bilateral) (R>L); Grade 1 Anterolisthesis of L5 over S1; L5 pars defect with spondylolisthesis (Bilateral); DDD (degenerative disc disease), lumbar; Lumbar facet syndrome (Bilateral) (R>L); Chronic pain syndrome; Chronic musculoskeletal pain; Neurogenic pain; Chronic sacroiliac joint pain (Bilateral) (R>L); Osteoarthritis of lumbar spine; Spondylosis without myelopathy or radiculopathy, lumbosacral region; Peripheral neuropathy; Generalized osteoarthritis of hand; Numbness and tingling of foot; Low back pain radiating to both legs; Abnormal EMG (chronic right lower lumbar polyradiculopathy); Chronic lower lumbar polyradiculopathy (Right); DDD (degenerative disc disease), lumbosacral; Lumbar  spondylosis w/ polyradiculopathy (Right); Lumbosacral radiculopathy (Right); Other intervertebral disc degeneration, lumbar region; Spondylolisthesis of lumbosacral region; Other spondylosis, sacral and sacrococcygeal region; Somatic dysfunction of sacroiliac joints (Bilateral); Osteoarthritis of sacroiliac joints (Bilateral); Myalgia due to statin; Chronic sacroiliac joint pain (Right); Osteoarthritis of sacroiliac joint (HCC) (Right); Enthesopathy of sacroiliac joint; Chronic low back pain (Bilateral) w/o sciatica; Chronic sacroiliac joint pain (Left); Osteoarthritis of sacroiliac joint (Left) (HCC); Somatic dysfunction of sacroiliac joint (Left); and Sciatica, left side on their pertinent problem list. Pain Assessment: Severity of   is reported as a  /10. Location:    / . Onset:  . Quality:  . Timing:  . Modifying factor(s):  . Vitals:  vitals were not taken for this visit.   Reason for encounter:  *** . ***  Pharmacotherapy Assessment  Analgesic: No opioid analgesics from our practice.   Monitoring: [place] PMP: PDMP reviewed during this encounter.       Pharmacotherapy: No side-effects or adverse reactions reported. Compliance: No problems identified. Effectiveness: Clinically acceptable.  No notes on file  UDS:  No results found for: "SUMMARY"   ROS  Constitutional: Denies any fever or chills Gastrointestinal: No reported hemesis, hematochezia, vomiting, or acute GI distress Musculoskeletal: Denies any acute onset joint swelling, redness, loss of ROM, or weakness Neurological: No reported episodes of acute onset apraxia, aphasia, dysarthria, agnosia, amnesia, paralysis, loss of coordination, or loss of consciousness  Medication Review  Turmeric, acetaminophen, amLODipine, atenolol, benzonatate, betamethasone dipropionate, calcium carbonate, co-enzyme Q-10, esomeprazole, etanercept, ezetimibe, fexofenadine, fluticasone, gabapentin, influenza vaccine adjuvanted, losartan, meloxicam,  montelukast, multivitamin with minerals, and valACYclovir  History Review  Allergy: Ashley Rodriguez is allergic to atorvastatin, sulfamethoxazole-trimethoprim, codeine, and sulfa antibiotics. Drug: Ashley Rodriguez  reports no history of drug use. Alcohol:  reports current alcohol use. Tobacco:  reports that she has never smoked. She has never used smokeless tobacco. Social: Ms.   Rodriguez  reports that she has never smoked. She has never used smokeless tobacco. She reports current alcohol use. She reports that she does not use drugs. Medical:  has a past medical history of COVID-19 (12/15/2018), Flank lipoma (08/17/2016), Hypertension, Neck mass (04/07/2013), Psoriatic arthritis ([place]), Recurrent cellulitis, and T2DM (type 2 diabetes mellitus) ([place]) (10/01/2018). Surgical: Ashley Rodriguez  has a past surgical history that includes Abdominal hysterectomy (2000); lipoma removed (04/2012); Colonoscopy (2015); and Neck mass excision (04/28/14). Family: family history includes Arrhythmia in her nephew; Atrial fibrillation in her brother and mother; Cancer in her father; Hypertension in her mother; Supraventricular tachycardia in her brother.  Laboratory Chemistry Profile   Renal Lab Results  Component Value Date   BUN 12 07/04/2021   CREATININE 0.71 07/04/2021   BCR 17 07/04/2021   GFRAA 94 05/17/2020   GFRNONAA 81 05/17/2020    Hepatic Lab Results  Component Value Date   AST 28 03/17/2021   ALT 26 03/17/2021   ALBUMIN 5.1 (H) 03/17/2021   ALKPHOS 75 03/17/2021    Electrolytes Lab Results  Component Value Date   NA 141 07/04/2021   K 5.0 07/04/2021   CL 101 07/04/2021   CALCIUM 10.1 07/04/2021   MG 2.1 01/03/2017    Bone Lab Results  Component Value Date   25OHVITD1 50 01/03/2017   25OHVITD2 <1.0 01/03/2017   25OHVITD3 50 01/03/2017    Inflammation (CRP: Acute Phase) (ESR: Chronic Phase) Lab Results  Component Value Date   CRP 0.8 01/03/2017   ESRSEDRATE 2 01/25/2020         Note: Above Lab  results reviewed.  Recent Imaging Review  DG PAIN CLINIC C-ARM 1-60 MIN NO REPORT Fluoro was used, but no Radiologist interpretation will be provided.  Please refer to "NOTES" tab for provider progress note. Note: Reviewed        Physical Exam  General appearance: Well nourished, well developed, and well hydrated. In no apparent acute distress Mental status: Alert, oriented x 3 (person, place, & time)       Respiratory: No evidence of acute respiratory distress Eyes: PERLA Vitals: There were no vitals taken for this visit. BMI: Estimated body mass index is 29.48 kg/m as calculated from the following:   Height as of 05/18/21: 5' 1" (1.549 m).   Weight as of 07/17/21: 156 lb (70.8 kg). Ideal: Patient weight not recorded  Assessment   Diagnosis Status  No diagnosis found. Controlled Controlled Controlled   Updated Problems: No problems updated.  Plan of Care  Problem-specific:  No problem-specific Assessment & Plan notes found for this encounter.  Ashley Rodriguez has a current medication list which includes the following long-term medication(s): amlodipine, atenolol, calcium carbonate, calcium carbonate, enbrel sureclick, esomeprazole, ezetimibe, fexofenadine, fluticasone, gabapentin, losartan, and montelukast.  Pharmacotherapy (Medications Ordered): No orders of the defined types were placed in this encounter.  Orders:  No orders of the defined types were placed in this encounter.  Follow-up plan:   No follow-ups on file.     Interventional Therapies  Risk  Complexity Considerations:   WNL   Planned  Pending:      Under consideration:   Therapeutic/palliative bilateral lumbar facet + sacroiliac joint RFA #2    Completed:   Diagnostic/therapeutic right L4-5 LESI x1 (01/22/2019) (50/50/90 x2 weeks)  Diagnostic bilateral SI Blk x2 (02/09/2020) (100/100/80) (100/100/100/50)  Therapeutic right SI RFA x1 (03/17/2020) (100/100/100/90-100)  Therapeutic left SI RFA x1  (03/31/2020) (100/100/100 x3 weeks/70)  Diagnostic bilateral lumbar facet  MBB x2 (01/22/2017; 04/18/2017) (100/100/90/90) (100/90/100/100)  Palliative bilateral lumbar facet MBB x3 (10/09/2018) (100/100/95/90-100/duration of up to 8 months)  Therapeutic right lumbar facet RFA x1 (07/21/2019) (100/100/75)  Therapeutic left lumbar facet RFA x1 (08/06/2019)  (100/100/75)  Therapeutic bilateral lumbar facet and SI joint Blk x1 (05/18/2021) (100/100/100) patient states that the combination works better than individually.   Palliative options:   Palliative lumbar facet MBB  Therapeutic lumbar facet RFA  Therapeutic SI RFA  Palliative SI joint Blk  Therapeutic L4-5 LESI      Recent Visits No visits were found meeting these conditions. Showing recent visits within past 90 days and meeting all other requirements Future Appointments Date Type Provider Dept  10/09/21 Appointment Naveira, Francisco, MD [facility]  Showing future appointments within next 90 days and meeting all other requirements  I discussed the assessment and treatment plan with the patient. The patient was provided an opportunity to ask questions and all were answered. The patient agreed with the plan and demonstrated an understanding of the instructions.  Patient advised to call back or seek an in-person evaluation if the symptoms or condition worsens.  Duration of encounter: *** minutes.  Note by: Francisco A Naveira, MD Date: 10/09/2021; Time: 7:46 AM 

## 2021-10-09 ENCOUNTER — Ambulatory Visit: Payer: Medicare Other | Attending: Pain Medicine | Admitting: Pain Medicine

## 2021-10-09 ENCOUNTER — Encounter: Payer: Self-pay | Admitting: Pain Medicine

## 2021-10-09 VITALS — BP 148/84 | HR 70 | Temp 97.3°F | Ht 64.0 in | Wt 156.0 lb

## 2021-10-09 DIAGNOSIS — M461 Sacroiliitis, not elsewhere classified: Secondary | ICD-10-CM | POA: Diagnosis not present

## 2021-10-09 DIAGNOSIS — M47816 Spondylosis without myelopathy or radiculopathy, lumbar region: Secondary | ICD-10-CM | POA: Diagnosis not present

## 2021-10-09 DIAGNOSIS — M545 Low back pain, unspecified: Secondary | ICD-10-CM | POA: Diagnosis not present

## 2021-10-09 DIAGNOSIS — M79604 Pain in right leg: Secondary | ICD-10-CM | POA: Diagnosis not present

## 2021-10-09 DIAGNOSIS — G8929 Other chronic pain: Secondary | ICD-10-CM | POA: Diagnosis not present

## 2021-10-09 DIAGNOSIS — M4310 Spondylolisthesis, site unspecified: Secondary | ICD-10-CM | POA: Insufficient documentation

## 2021-10-09 DIAGNOSIS — M533 Sacrococcygeal disorders, not elsewhere classified: Secondary | ICD-10-CM | POA: Diagnosis not present

## 2021-10-09 DIAGNOSIS — M79605 Pain in left leg: Secondary | ICD-10-CM | POA: Insufficient documentation

## 2021-10-09 NOTE — Progress Notes (Signed)
Safety precautions to be maintained throughout the outpatient stay will include: orient to surroundings, keep bed in low position, maintain call bell within reach at all times, provide assistance with transfer out of bed and ambulation.  

## 2021-10-09 NOTE — Patient Instructions (Addendum)
______________________________________________________________________  Preparing for Procedure with Sedation  NOTICE: Due to recent regulatory changes, starting on November 28, 2020, procedures requiring intravenous (IV) sedation will no longer be performed at the [facility].  These types of procedures are required to be performed at [facility] ambulatory surgery facility.  We are very sorry for the inconvenience.  Procedure appointments are limited to planned procedures: No Prescription Refills. No disability issues will be discussed. No medication changes will be discussed.  Instructions: Oral Intake: Do not eat or drink anything for at least 8 hours prior to your procedure. (Exception: Blood Pressure Medication. See below.) Transportation: A driver is required. You may not drive yourself after the procedure. Blood Pressure Medicine: Do not forget to take your blood pressure medicine with a sip of water the morning of the procedure. If your Diastolic (lower reading) is above 100 mmHg, elective cases will be cancelled/rescheduled. Blood thinners: These will need to be stopped for procedures. Notify our staff if you are taking any blood thinners. Depending on which one you take, there will be specific instructions on how and when to stop it. Diabetics on insulin: Notify the staff so that you can be scheduled 1st case in the morning. If your diabetes requires high dose insulin, take only  of your normal insulin dose the morning of the procedure and notify the staff that you have done so. Preventing infections: Shower with an antibacterial soap the morning of your procedure. Build-up your immune system: Take 1000 mg of Vitamin C with every meal (3 times a day) the day prior to your procedure. Antibiotics: Inform the staff if you have a condition or reason that requires you to take antibiotics before dental procedures. Pregnancy: If you are pregnant, call and cancel the procedure. Sickness: If  you have a cold, fever, or any active infections, call and cancel the procedure. Arrival: You must be in the facility at least 30 minutes prior to your scheduled procedure. Children: Do not bring children with you. Dress appropriately: There is always the possibility that your clothing may get soiled. Valuables: Do not bring any jewelry or valuables.  Reasons to call and reschedule or cancel your procedure: (Following these recommendations will minimize the risk of a serious complication.) Surgeries: Avoid having procedures within 2 weeks of any surgery. (Avoid for 2 weeks before or after any surgery). Flu Shots: Avoid having procedures within 2 weeks of a flu shots. (Avoid for 2 weeks before or after immunizations). Barium: Avoid having a procedure within 7-10 days after having had a radiological study involving the use of radiological contrast. (Myelograms, Barium swallow or enema study). Heart attacks: Avoid any elective procedures or surgeries for the initial 6 months after a "Myocardial Infarction" (Heart Attack). Blood thinners: It is imperative that you stop these medications before procedures. Let us know if you if you take any blood thinner.  Infection: Avoid procedures during or within two weeks of an infection (including chest colds or gastrointestinal problems). Symptoms associated with infections include: Localized redness, fever, chills, night sweats or profuse sweating, burning sensation when voiding, cough, congestion, stuffiness, runny nose, sore throat, diarrhea, nausea, vomiting, cold or Flu symptoms, recent or current infections. It is specially important if the infection is over the area that we intend to treat. Heart and lung problems: Symptoms that may suggest an active cardiopulmonary problem include: cough, chest pain, breathing difficulties or shortness of breath, dizziness, ankle swelling, uncontrolled high or unusually low blood pressure, and/or palpitations. If you are    experiencing any of these symptoms, cancel your procedure and contact your primary care physician for an evaluation.  Remember:  Regular Business hours are:  Monday to Thursday 8:00 AM to 4:00 PM  Provider's Schedule: Devani Odonnel, MD:  Procedure days: Tuesday and Thursday 7:30 AM to 4:00 PM  Bilal Lateef, MD:  Procedure days: Monday and Wednesday 7:30 AM to 4:00 PM ______________________________________________________________________  ____________________________________________________________________________________________  General Risks and Possible Complications  Patient Responsibilities: It is important that you read this as it is part of your informed consent. It is our duty to inform you of the risks and possible complications associated with treatments offered to you. It is your responsibility as a patient to read this and to ask questions about anything that is not clear or that you believe was not covered in this document.  Patient's Rights: You have the right to refuse treatment. You also have the right to change your mind, even after initially having agreed to have the treatment done. However, under this last option, if you wait until the last second to change your mind, you may be charged for the materials used up to that point.  Introduction: Medicine is not an exact science. Everything in Medicine, including the lack of treatment(s), carries the potential for danger, harm, or loss (which is by definition: Risk). In Medicine, a complication is a secondary problem, condition, or disease that can aggravate an already existing one. All treatments carry the risk of possible complications. The fact that a side effects or complications occurs, does not imply that the treatment was conducted incorrectly. It must be clearly understood that these can happen even when everything is done following the highest safety standards.  No treatment: You can choose not to proceed with the  proposed treatment alternative. The "PRO(s)" would include: avoiding the risk of complications associated with the therapy. The "CON(s)" would include: not getting any of the treatment benefits. These benefits fall under one of three categories: diagnostic; therapeutic; and/or palliative. Diagnostic benefits include: getting information which can ultimately lead to improvement of the disease or symptom(s). Therapeutic benefits are those associated with the successful treatment of the disease. Finally, palliative benefits are those related to the decrease of the primary symptoms, without necessarily curing the condition (example: decreasing the pain from a flare-up of a chronic condition, such as incurable terminal cancer).  General Risks and Complications: These are associated to most interventional treatments. They can occur alone, or in combination. They fall under one of the following six (6) categories: no benefit or worsening of symptoms; bleeding; infection; nerve damage; allergic reactions; and/or death. No benefits or worsening of symptoms: In Medicine there are no guarantees, only probabilities. No healthcare provider can ever guarantee that a medical treatment will work, they can only state the probability that it may. Furthermore, there is always the possibility that the condition may worsen, either directly, or indirectly, as a consequence of the treatment. Bleeding: This is more common if the patient is taking a blood thinner, either prescription or over the counter (example: Goody Powders, Fish oil, Aspirin, Garlic, etc.), or if suffering a condition associated with impaired coagulation (example: Hemophilia, cirrhosis of the liver, low platelet counts, etc.). However, even if you do not have one on these, it can still happen. If you have any of these conditions, or take one of these drugs, make sure to notify your treating physician. Infection: This is more common in patients with a compromised  immune system, either due to disease (example:   diabetes, cancer, human immunodeficiency virus [HIV], etc.), or due to medications or treatments (example: therapies used to treat cancer and rheumatological diseases). However, even if you do not have one on these, it can still happen. If you have any of these conditions, or take one of these drugs, make sure to notify your treating physician. Nerve Damage: This is more common when the treatment is an invasive one, but it can also happen with the use of medications, such as those used in the treatment of cancer. The damage can occur to small secondary nerves, or to large primary ones, such as those in the spinal cord and brain. This damage may be temporary or permanent and it may lead to impairments that can range from temporary numbness to permanent paralysis and/or brain death. Allergic Reactions: Any time a substance or material comes in contact with our body, there is the possibility of an allergic reaction. These can range from a mild skin rash (contact dermatitis) to a severe systemic reaction (anaphylactic reaction), which can result in death. Death: In general, any medical intervention can result in death, most of the time due to an unforeseen complication. ____________________________________________________________________________________________   ____________________________________________________________________________________________  Muscle Spasms & Cramps  Cause:  The most common cause of muscle spasms and cramps is vitamin and/or electrolyte (calcium, potassium, sodium, etc.) deficiencies.  Possible triggers: Sweating - causes loss of electrolytes thru the skin. Steroids - causes loss of electrolytes thru the urine.  Treatment: Gatorade (or any other electrolyte-replenishing drink) - Take 1, 8 oz glass with each meal (3 times a day). OTC (over-the-counter) Magnesium 400 to 500 mg - Take 1 tablet twice a day (one with breakfast and  one before bedtime). If you have kidney problems, talk to your primary care physician before taking any Magnesium. Tonic Water with quinine - Take 1, 8 oz glass before bedtime.   ____________________________________________________________________________________________

## 2021-10-17 NOTE — Progress Notes (Signed)
NO SHOW to appointment

## 2021-10-23 DIAGNOSIS — M9903 Segmental and somatic dysfunction of lumbar region: Secondary | ICD-10-CM | POA: Diagnosis not present

## 2021-10-23 DIAGNOSIS — M542 Cervicalgia: Secondary | ICD-10-CM | POA: Diagnosis not present

## 2021-10-23 DIAGNOSIS — M9901 Segmental and somatic dysfunction of cervical region: Secondary | ICD-10-CM | POA: Diagnosis not present

## 2021-10-23 DIAGNOSIS — M5416 Radiculopathy, lumbar region: Secondary | ICD-10-CM | POA: Diagnosis not present

## 2021-10-24 ENCOUNTER — Other Ambulatory Visit: Payer: Self-pay | Admitting: Family Medicine

## 2021-11-05 NOTE — Progress Notes (Unsigned)
PROVIDER NOTE: Interpretation of information contained herein should be left to medically-trained personnel. Specific patient instructions are provided elsewhere under "Patient Instructions" section of medical record. This document was created in part using STT-dictation technology, any transcriptional errors that may result from this process are unintentional.  Patient: Ashley Rodriguez Type: Established DOB: August 12, 1950 MRN: [id number] PCP: Virginia Crews, MD  Service: Procedure DOS: 11/09/2021 Setting: Ambulatory Location: Ambulatory outpatient facility Delivery: Face-to-face Provider: Gaspar Cola, MD Specialty: Interventional Pain Management Specialty designation: 09 Location: Outpatient facility Ref. Prov.: Milinda Pointer, MD    Primary Reason for Visit: Interventional Pain Management Treatment. CC: No chief complaint on file.    Procedure:          Anesthesia, Analgesia, Anxiolysis:  Procedure #1: Type: Medial Branch Facet Block         Primary Purpose: Diagnostic Region: Lumbar Level: L2, L3, L4, L5, & S1 Medial Branch Level(s) Target Area: For Lumbar Facet blocks, the target is the groove formed by the junction of the transverse process and superior articular process. For the L5 dorsal ramus, the target is the notch between superior articular process and sacral ala. For the S1 dorsal ramus, the target is the superior and lateral edge of the posterior S1 Sacral foramen. Approach: Posterior, paramedial, percutaneous approach. Laterality: Bilateral  Procedure #2: Type: Sacroiliac Joint Block           Primary Purpose: Diagnostic Region: Posterior Lumbosacral Level: PSIS (Posterior Superior Iliac Spine) Sacroiliac Joint Target Area: For upper sacroiliac joint block(s), the target is the superior and posterior margin of the sacroiliac joint. Approach: Ipsilateral approach. Laterality: Bilateral  Anesthesia: Local (1-2% Lidocaine)  Anxiolysis: None  Sedation: None   Guidance: Fluoroscopy           Position: Prone   No diagnosis found. NAS-11 Pain score:   Pre-procedure:  /10   Post-procedure:  /10     Pre-op H&P Assessment:  Ashley Rodriguez is a 71 y.o. (year old), female patient, seen today for interventional treatment. She  has a past surgical history that includes Abdominal hysterectomy (2000); lipoma removed (04/2012); Colonoscopy (2015); and Neck mass excision (04/28/14). Ashley Rodriguez has a current medication list which includes the following prescription(s): acetaminophen, amlodipine, atenolol, betamethasone dipropionate, calcium carbonate, co-enzyme W-23, enbrel sureclick, esomeprazole, ezetimibe, fexofenadine, fluticasone, gabapentin, losartan, meloxicam, montelukast, multivitamin with minerals, turmeric, and valacyclovir, and the following Facility-Administered Medications: triamcinolone acetonide. Her primarily concern today is the No chief complaint on file.  Initial Vital Signs:  Pulse/HCG Rate:    Temp:   Resp:   BP:   SpO2:    BMI: Estimated body mass index is 26.78 kg/m as calculated from the following:   Height as of 10/09/21: '5\' 4"'$  (1.626 m).   Weight as of 10/09/21: 156 lb (70.8 kg).  Risk Assessment: Allergies: Reviewed. She is allergic to atorvastatin, sulfamethoxazole-trimethoprim, codeine, and sulfa antibiotics.  Allergy Precautions: None required Coagulopathies: Reviewed. None identified.  Blood-thinner therapy: None at this time Active Infection(s): Reviewed. None identified. Ashley Rodriguez is afebrile  Site Confirmation: Ashley Rodriguez was asked to confirm the procedure and laterality before marking the site Procedure checklist: Completed Consent: Before the procedure and under the influence of no sedative(s), amnesic(s), or anxiolytics, the patient was informed of the treatment options, risks and possible complications. To fulfill our ethical and legal obligations, as recommended by the American Medical Association's Code of Ethics,  I have informed the patient of my clinical impression; the nature and purpose of the treatment  or procedure; the risks, benefits, and possible complications of the intervention; the alternatives, including doing nothing; the risk(s) and benefit(s) of the alternative treatment(s) or procedure(s); and the risk(s) and benefit(s) of doing nothing. The patient was provided information about the general risks and possible complications associated with the procedure. These may include, but are not limited to: failure to achieve desired goals, infection, bleeding, organ or nerve damage, allergic reactions, paralysis, and death. In addition, the patient was informed of those risks and complications associated to Spine-related procedures, such as failure to decrease pain; infection (i.e.: Meningitis, epidural or intraspinal abscess); bleeding (i.e.: epidural hematoma, subarachnoid hemorrhage, or any other type of intraspinal or peri-dural bleeding); organ or nerve damage (i.e.: Any type of peripheral nerve, nerve root, or spinal cord injury) with subsequent damage to sensory, motor, and/or autonomic systems, resulting in permanent pain, numbness, and/or weakness of one or several areas of the body; allergic reactions; (i.e.: anaphylactic reaction); and/or death. Furthermore, the patient was informed of those risks and complications associated with the medications. These include, but are not limited to: allergic reactions (i.e.: anaphylactic or anaphylactoid reaction(s)); adrenal axis suppression; blood sugar elevation that in diabetics may result in ketoacidosis or comma; water retention that in patients with history of congestive heart failure may result in shortness of breath, pulmonary edema, and decompensation with resultant heart failure; weight gain; swelling or edema; medication-induced neural toxicity; particulate matter embolism and blood vessel occlusion with resultant organ, and/or nervous system infarction;  and/or aseptic necrosis of one or more joints. Finally, the patient was informed that Medicine is not an exact science; therefore, there is also the possibility of unforeseen or unpredictable risks and/or possible complications that may result in a catastrophic outcome. The patient indicated having understood very clearly. We have given the patient no guarantees and we have made no promises. Enough time was given to the patient to ask questions, all of which were answered to the patient's satisfaction. Ms. Korber has indicated that she wanted to continue with the procedure. Attestation: I, the ordering provider, attest that I have discussed with the patient the benefits, risks, side-effects, alternatives, likelihood of achieving goals, and potential problems during recovery for the procedure that I have provided informed consent. Date  Time: {CHL [facility] TIME CHOICES:[id number]}  Pre-Procedure Preparation:  Monitoring: As per clinic protocol. Respiration, ETCO2, SpO2, BP, heart rate and rhythm monitor placed and checked for adequate function Safety Precautions: Patient was assessed for positional comfort and pressure points before starting the procedure. Time-out: I initiated and conducted the "Time-out" before starting the procedure, as per protocol. The patient was asked to participate by confirming the accuracy of the "Time Out" information. Verification of the correct person, site, and procedure were performed and confirmed by me, the nursing staff, and the patient. "Time-out" conducted as per Joint Commission's Universal Protocol (UP.01.01.01). Time:    Description of Procedure #1:   Time-out: "Time-out" completed before starting procedure, as per protocol. Area Prepped: Entire Posterior Lumbosacral Region DuraPrep (Iodine Povacrylex [0.7% available iodine] and Isopropyl Alcohol, 74% w/w) Safety Precautions: Aspiration looking for blood return was conducted prior to all injections. At no point  did we inject any substances, as a needle was being advanced. No attempts were made at seeking any paresthesias. Safe injection practices and needle disposal techniques used. Medications properly checked for expiration dates. SDV (single dose vial) medications used.  Description of the Procedure: Protocol guidelines were followed. The patient was placed in position over the fluoroscopy table. The target  area was identified and the area prepped in the usual manner. Skin & deeper tissues infiltrated with local anesthetic. Appropriate amount of time allowed to pass for local anesthetics to take effect. The procedure needle was introduced through the skin, ipsilateral to the reported pain, and advanced to the target area. Employing the "Medial Branch Technique", the needles were advanced to the angle made by the superior and medial portion of the transverse process, and the lateral and inferior portion of the superior articulating process of the targeted vertebral bodies. This area is known as "Burton's Eye" or the "Eye of the [place] Dog". A procedure needle was introduced through the skin, and this time advanced to the angle made by the superior and medial border of the sacral ala, and the lateral border of the S1 vertebral body. This last needle was later repositioned at the superior and lateral border of the posterior S1 foramen. Negative aspiration confirmed. Solution injected in intermittent fashion, asking for systemic symptoms every 0.5cc of injectate. The needles were then removed and the area cleansed, making sure to leave some of the prepping solution back to take advantage of its long term bactericidal properties. Start Time:   hrs. Materials:  Needle(s) Type: Spinal Needle Gauge: 22G Length: 3.5-in Medication(s): Please see orders for medications and dosing details.  Description of Procedure # 2:   Area Prepped: Entire Posterior Lumbosacral Region DuraPrep (Iodine Povacrylex [0.7% available  iodine] and Isopropyl Alcohol, 74% w/w) Safety Precautions: Aspiration looking for blood return was conducted prior to all injections. At no point did we inject any substances, as a needle was being advanced. No attempts were made at seeking any paresthesias. Safe injection practices and needle disposal techniques used. Medications properly checked for expiration dates. SDV (single dose vial) medications used. Description of the Procedure: Protocol guidelines were followed. The patient was placed in position over the fluoroscopy table. The target area was identified and the area prepped in the usual manner. Skin & deeper tissues infiltrated with local anesthetic. Appropriate amount of time allowed to pass for local anesthetics to take effect. The procedure needle was advanced under fluoroscopic guidance into the sacroiliac joint until a firm endpoint was obtained. Proper needle placement secured. Negative aspiration confirmed. Solution injected in intermittent fashion, asking for systemic symptoms every 0.5cc of injectate. The needles were then removed and the area cleansed, making sure to leave some of the prepping solution back to take advantage of its long term bactericidal properties. There were no vitals filed for this visit.  End Time:   hrs. Materials:  Needle(s) Type: Spinal Needle Gauge: 22G Length: 5.0-in Medication(s): Please see orders for medications and dosing details.  Imaging Guidance (Spinal):          Type of Imaging Technique: Fluoroscopy Guidance (Spinal) Indication(s): Assistance in needle guidance and placement for procedures requiring needle placement in or near specific anatomical locations not easily accessible without such assistance. Exposure Time: Please see nurses notes. Contrast: None used. Fluoroscopic Guidance: I was personally present during the use of fluoroscopy. "Tunnel Vision Technique" used to obtain the best possible view of the target area. Parallax error  corrected before commencing the procedure. "Direction-depth-direction" technique used to introduce the needle under continuous pulsed fluoroscopy. Once target was reached, antero-posterior, oblique, and lateral fluoroscopic projection used confirm needle placement in all planes. Images permanently stored in EMR. Interpretation: No contrast injected. I personally interpreted the imaging intraoperatively. Adequate needle placement confirmed in multiple planes. Permanent images saved into the patient's record.  Antibiotic Prophylaxis:   Anti-infectives (From admission, onward)    None      Indication(s): None identified  Post-operative Assessment:  Post-procedure Vital Signs:  Pulse/HCG Rate:    Temp:   Resp:   BP:   SpO2:    EBL: None  Complications: No immediate post-treatment complications observed by team, or reported by patient.  Note: The patient tolerated the entire procedure well. A repeat set of vitals were taken after the procedure and the patient was kept under observation following institutional policy, for this type of procedure. Post-procedural neurological assessment was performed, showing return to baseline, prior to discharge. The patient was provided with post-procedure discharge instructions, including a section on how to identify potential problems. Should any problems arise concerning this procedure, the patient was given instructions to immediately contact us, at any time, without hesitation. In any case, we plan to contact the patient by telephone for a follow-up status report regarding this interventional procedure.  Comments:  No additional relevant information.  Plan of Care  Orders:  No orders of the defined types were placed in this encounter.  Chronic Opioid Analgesic:  No opioid analgesics from our practice.   Medications ordered for procedure: No orders of the defined types were placed in this encounter.  Medications administered: [name] had  no medications administered during this visit.  See the medical record for exact dosing, route, and time of administration.  Follow-up plan:   No follow-ups on file.       Interventional Therapies  Risk  Complexity Considerations:   WNL   Planned  Pending:   Therapeutic/palliative bilateral lumbar facet MBB #6 + sacroiliac joint Blk #4  Therapeutic/palliative bilateral lumbar facet + sacroiliac joint RFA #2    Under consideration:   Therapeutic/palliative bilateral lumbar facet + sacroiliac joint RFA #2    Completed:   Diagnostic/therapeutic right L4-5 LESI x1 (01/22/2019) (50/50/90 x2 weeks)  Diagnostic bilateral SI Blk x2 (02/09/2020) (100/100/80) (100/100/100/50)  Therapeutic right SI RFA x1 (03/17/2020) (100/100/100/90-100)  Therapeutic left SI RFA x1 (03/31/2020) (100/100/100 x3 weeks/70)  Diagnostic bilateral lumbar facet MBB x2 (01/22/2017; 04/18/2017) (100/100/90/90) (100/90/100/100)  Palliative bilateral lumbar facet MBB x3 (10/09/2018) (100/100/95/90-100/duration of up to 8 months)  Therapeutic right lumbar facet RFA x1 (07/21/2019) (100/100/75)  Therapeutic left lumbar facet RFA x1 (08/06/2019)  (100/100/75)  Therapeutic bilateral lumbar facet and SI joint Blk x1 (05/18/2021) (100/100/100) patient states that the combination works better than individually.   Palliative options:   Palliative lumbar facet MBB  Therapeutic lumbar facet RFA  Therapeutic SI RFA  Palliative SI joint Blk  Therapeutic L4-5 LESI      Recent Visits Date Type Provider Dept  10/09/21 Office Visit Milinda Pointer, MD [facility]  Showing recent visits within past 90 days and meeting all other requirements Future Appointments Date Type Provider Dept  11/09/21 Appointment Milinda Pointer, MD [facility]  Showing future appointments within next 90 days and meeting all other requirements  Disposition: Discharge home  Discharge (Date  Time): 11/09/2021;   hrs.   Primary  Care Physician: Virginia Crews, MD Location: [facility] Note by: Gaspar Cola, MD Date: 11/09/2021; Time: 4:13 PM  Disclaimer:  Medicine is not an Chief Strategy Officer. The only guarantee in medicine is that nothing is guaranteed. It is important to note that the decision to proceed with this intervention was based on the information collected from the patient. The Data and conclusions were drawn from the patient's  questionnaire, the interview, and the physical examination. Because the information was provided in large part by the patient, it cannot be guaranteed that it has not been purposely or unconsciously manipulated. Every effort has been made to obtain as much relevant data as possible for this evaluation. It is important to note that the conclusions that lead to this procedure are derived in large part from the available data. Always take into account that the treatment will also be dependent on availability of resources and existing treatment guidelines, considered by other Pain Management Practitioners as being common knowledge and practice, at the time of the intervention. For Medico-Legal purposes, it is also important to point out that variation in procedural techniques and pharmacological choices are the acceptable norm. The indications, contraindications, technique, and results of the above procedure should only be interpreted and judged by a Board-Certified Interventional Pain Specialist with extensive familiarity and expertise in the same exact procedure and technique.

## 2021-11-09 ENCOUNTER — Ambulatory Visit
Admission: RE | Admit: 2021-11-09 | Discharge: 2021-11-09 | Disposition: A | Discharge status: Home / Self Care | Payer: Medicare Other | Source: Ambulatory Visit | Attending: Pain Medicine | Admitting: Pain Medicine

## 2021-11-09 ENCOUNTER — Ambulatory Visit: Payer: Medicare Other | Attending: Pain Medicine | Admitting: Pain Medicine

## 2021-11-09 ENCOUNTER — Encounter: Payer: Self-pay | Admitting: Pain Medicine

## 2021-11-09 ENCOUNTER — Other Ambulatory Visit: Payer: Self-pay | Admitting: Family Medicine

## 2021-11-09 VITALS — BP 173/75 | HR 58 | Temp 97.2°F | Resp 16 | Ht 62.0 in | Wt 152.0 lb

## 2021-11-09 DIAGNOSIS — M4310 Spondylolisthesis, site unspecified: Secondary | ICD-10-CM | POA: Diagnosis not present

## 2021-11-09 DIAGNOSIS — M533 Sacrococcygeal disorders, not elsewhere classified: Secondary | ICD-10-CM | POA: Diagnosis not present

## 2021-11-09 DIAGNOSIS — M9904 Segmental and somatic dysfunction of sacral region: Secondary | ICD-10-CM | POA: Diagnosis not present

## 2021-11-09 DIAGNOSIS — M5137 Other intervertebral disc degeneration, lumbosacral region: Secondary | ICD-10-CM | POA: Diagnosis not present

## 2021-11-09 DIAGNOSIS — G8929 Other chronic pain: Secondary | ICD-10-CM | POA: Insufficient documentation

## 2021-11-09 DIAGNOSIS — M779 Enthesopathy, unspecified: Secondary | ICD-10-CM | POA: Diagnosis not present

## 2021-11-09 DIAGNOSIS — M545 Low back pain, unspecified: Secondary | ICD-10-CM | POA: Diagnosis not present

## 2021-11-09 DIAGNOSIS — M461 Sacroiliitis, not elsewhere classified: Secondary | ICD-10-CM | POA: Insufficient documentation

## 2021-11-09 DIAGNOSIS — M47896 Other spondylosis, lumbar region: Secondary | ICD-10-CM | POA: Diagnosis not present

## 2021-11-09 DIAGNOSIS — M47817 Spondylosis without myelopathy or radiculopathy, lumbosacral region: Secondary | ICD-10-CM | POA: Insufficient documentation

## 2021-11-09 DIAGNOSIS — M47898 Other spondylosis, sacral and sacrococcygeal region: Secondary | ICD-10-CM | POA: Insufficient documentation

## 2021-11-09 DIAGNOSIS — M47816 Spondylosis without myelopathy or radiculopathy, lumbar region: Secondary | ICD-10-CM | POA: Diagnosis not present

## 2021-11-09 MED ORDER — FENTANYL CITRATE (PF) 100 MCG/2ML IJ SOLN: 25.0000 ug | INTRAMUSCULAR | Status: DC | PRN

## 2021-11-09 MED ORDER — ROPIVACAINE HCL 2 MG/ML IJ SOLN
9.0000 mL | Freq: Once | INTRAMUSCULAR | Status: AC
  Administered 2021-11-09: 9 mL via INTRA_ARTICULAR
  Filled 2021-11-09: qty 20

## 2021-11-09 MED ORDER — MIDAZOLAM HCL 5 MG/5ML IJ SOLN
0.5000 mg | Freq: Once | INTRAMUSCULAR | Status: AC
  Administered 2021-11-09: 2 mg via INTRAVENOUS
  Filled 2021-11-09: qty 5

## 2021-11-09 MED ORDER — LACTATED RINGERS IV SOLN
Freq: Once | INTRAVENOUS | Status: AC
  Administered 2021-11-09: 1000 mL via INTRAVENOUS

## 2021-11-09 MED ORDER — METHYLPREDNISOLONE ACETATE 80 MG/ML IJ SUSP
80.0000 mg | Freq: Once | INTRAMUSCULAR | Status: DC
  Filled 2021-11-09: qty 1

## 2021-11-09 MED ORDER — TRIAMCINOLONE ACETONIDE 40 MG/ML IJ SUSP
80.0000 mg | Freq: Once | INTRAMUSCULAR | Status: AC
  Administered 2021-11-09: 80 mg
  Filled 2021-11-09: qty 2

## 2021-11-09 MED ORDER — LIDOCAINE HCL 2 % IJ SOLN
20.0000 mL | Freq: Once | INTRAMUSCULAR | Status: AC
  Administered 2021-11-09: 400 mg
  Filled 2021-11-09: qty 20

## 2021-11-09 MED ORDER — ROPIVACAINE HCL 2 MG/ML IJ SOLN
18.0000 mL | Freq: Once | INTRAMUSCULAR | Status: AC
  Administered 2021-11-09: 18 mL via PERINEURAL
  Filled 2021-11-09: qty 20

## 2021-11-09 MED ORDER — PENTAFLUOROPROP-TETRAFLUOROETH EX AERO
INHALATION_SPRAY | Freq: Once | CUTANEOUS | Status: DC
  Filled 2021-11-09: qty 116

## 2021-11-09 NOTE — Patient Instructions (Addendum)

## 2021-11-09 NOTE — Telephone Encounter (Signed)
Requested Prescriptions  Pending Prescriptions Disp Refills  . losartan (COZAAR) 25 MG tablet [Pharmacy Med Name: LOSARTAN POTASSIUM 25 MG TAB] 45 tablet 0    Sig: TAKE 1/2 TABLET (12.5 MG TOTAL) BY MOUTHONCE DAILY     Cardiovascular:  Angiotensin Receptor Blockers Failed - 11/09/2021  4:08 PM      Failed - Last BP in normal range    BP Readings from Last 1 Encounters:  11/09/21 (!) 173/75         Passed - Cr in normal range and within 180 days    Creatinine  Date Value Ref Range Status  04/16/2014 0.68 0.60 - 1.30 mg/dL Final   Creatinine, Ser  Date Value Ref Range Status  07/04/2021 0.71 0.57 - 1.00 mg/dL Final   Creatinine, POC  Date Value Ref Range Status  01/01/2019 NA\ mg/dL Final         Passed - K in normal range and within 180 days    Potassium  Date Value Ref Range Status  07/04/2021 5.0 3.5 - 5.2 mmol/L Final         Passed - Patient is not pregnant      Passed - Valid encounter within last 6 months    Recent Outpatient Visits          3 months ago Acute cough   [facility] [place], [place], PA-C   4 months ago Hypertension associated with diabetes [facility])   [place], Dionne Bucy, MD   7 months ago Hypertension associated with diabetes [facility])   [facility], Dionne Bucy, MD   1 year ago Allergic rhinitis with postnasal drip   [facility] [place], Dionne Bucy, MD   1 year ago Viral URI   [place], Dionne Bucy, MD      Future Appointments            In 2 months [place], Dionne Bucy, MD [facility] [place]

## 2021-11-09 NOTE — Progress Notes (Signed)
Safety precautions to be maintained throughout the outpatient stay will include: orient to surroundings, keep bed in low position, maintain call bell within reach at all times, provide assistance with transfer out of bed and ambulation.  

## 2021-11-10 ENCOUNTER — Telehealth: Payer: Self-pay

## 2021-11-10 NOTE — Telephone Encounter (Signed)
Post procedure follow up phone call.  Patient states she is doing good.

## 2021-11-14 DIAGNOSIS — M9903 Segmental and somatic dysfunction of lumbar region: Secondary | ICD-10-CM | POA: Diagnosis not present

## 2021-11-14 DIAGNOSIS — M5416 Radiculopathy, lumbar region: Secondary | ICD-10-CM | POA: Diagnosis not present

## 2021-11-14 DIAGNOSIS — M542 Cervicalgia: Secondary | ICD-10-CM | POA: Diagnosis not present

## 2021-11-14 DIAGNOSIS — M9901 Segmental and somatic dysfunction of cervical region: Secondary | ICD-10-CM | POA: Diagnosis not present

## 2021-11-22 DIAGNOSIS — L409 Psoriasis, unspecified: Secondary | ICD-10-CM | POA: Diagnosis not present

## 2021-11-22 DIAGNOSIS — L4050 Arthropathic psoriasis, unspecified: Secondary | ICD-10-CM | POA: Diagnosis not present

## 2021-11-22 DIAGNOSIS — Z79899 Other long term (current) drug therapy: Secondary | ICD-10-CM | POA: Diagnosis not present

## 2021-11-22 DIAGNOSIS — M79672 Pain in left foot: Secondary | ICD-10-CM | POA: Diagnosis not present

## 2021-11-22 DIAGNOSIS — M5136 Other intervertebral disc degeneration, lumbar region: Secondary | ICD-10-CM | POA: Diagnosis not present

## 2021-11-27 NOTE — Progress Notes (Unsigned)
Patient: Ashley Rodriguez  Service Category: E/M  Provider: Gaspar Cola, MD  DOB: Jan 31, 1951  DOS: 11/28/2021  Location: Office  MRN: [id number]  Setting: Ambulatory outpatient  Referring Provider: Virginia Crews, MD  Type: Established Patient  Specialty: Interventional Pain Management  PCP: Virginia Crews, MD  Location: Remote location  Delivery: TeleHealth     Virtual Encounter - Pain Management PROVIDER NOTE: Information contained herein reflects review and annotations entered in association with encounter. Interpretation of such information and data should be left to medically-trained personnel. Information provided to patient can be located elsewhere in the medical record under "Patient Instructions". Document created using STT-dictation technology, any transcriptional errors that may result from process are unintentional.    Contact & Pharmacy Preferred: [place]: [phone] (home) Mobile: [phone] (mobile) E-mail: [email or web address]  [facility] - [place], [place] - [address] [place] [address] Phone: [phone] Fax: [place] [phone] [place], [place] - [address] [place] [address] Phone: [phone] Fax: [phone]   Pre-screening  Ashley Rodriguez offered "in-person" vs "virtual" encounter. She indicated preferring virtual for this encounter.   Reason COVID-19*  Social distancing based on CDC and AMA recommendations.   I contacted Ashley Rodriguez on 11/28/2021 via telephone.      I clearly identified myself as Gaspar Cola, MD. I verified that I was speaking with the correct person using two identifiers (Name: Ameriah Lint, and date of birth: 03-09-1951).  Consent I sought verbal advanced consent from Ashley Rodriguez for virtual visit interactions. I informed Ashley Rodriguez of possible security and privacy concerns, risks, and limitations associated with  providing "not-in-person" medical evaluation and management services. I also informed Ashley Rodriguez of the availability of "in-person" appointments. Finally, I informed her that there would be a charge for the virtual visit and that she could be  personally, fully or partially, financially responsible for it. Ms. Heitman expressed understanding and agreed to proceed.   Historic Elements   Ashley Rodriguez is a 71 y.o. year old, female patient evaluated today after our last contact on 11/09/2021. Ashley Rodriguez  has a past medical history of COVID-19 (12/15/2018), Flank lipoma (08/17/2016), Hypertension, Neck mass (04/07/2013), Psoriatic arthritis ([place]), Recurrent cellulitis, and T2DM (type 2 diabetes mellitus) ([place]) (10/01/2018). She also  has a past surgical history that includes Abdominal hysterectomy (2000); lipoma removed (04/2012); Colonoscopy (2015); and Neck mass excision (04/28/14). Ashley Rodriguez has a current medication list which includes the following prescription(s): acetaminophen, amlodipine, atenolol, betamethasone dipropionate, calcium carbonate, co-enzyme O-87, enbrel sureclick, esomeprazole, ezetimibe, fexofenadine, fluticasone, gabapentin, losartan, montelukast, multivitamin with minerals, turmeric, valacyclovir, and meloxicam, and the following Facility-Administered Medications: triamcinolone acetonide. She  reports that she has never smoked. She has never used smokeless tobacco. She reports current alcohol use. She reports that she does not use drugs. Ashley Rodriguez is allergic to atorvastatin, sulfamethoxazole-trimethoprim, codeine, and sulfa antibiotics.   HPI  Today, she is being contacted for  ***   Post-procedure evaluation   Procedure No.1: Lumbar Facet, Medial Branch Block(s) #6    Laterality: Bilateral  Level(s): L2, L3, L4, L5, & S1 Medial Branch Level(s).  Rationale: Injecting these levels should provide temporary sensory nerve conduction blockade of the L3-4, L4-5 and L5-S1 lumbar facet  (zygapophyseal) joints. Target: Terminal medial branch nerve division of lumbosacral nerve root dorsal rami.  Procedure No.2: Sacroiliac joint injection #4    Laterality: Bilateral     Level: PSIS (Posterior  Superior Iliac Spine) Sacroiliac Joint Target: For upper sacroiliac joint block(s), the target is the superior and posterior margin of the sacroiliac joint.  Imaging: Fluoroscopic guidance Anesthesia: Local anesthesia (1-2% Lidocaine) Anxiolysis: IV Versed         Sedation: None. DOS: 11/09/2021 Performed by: Gaspar Cola, MD  Purpose: Diagnostic/Therapeutic Indications: Low back pain severe enough to impact quality of life or function. Medical necessity rationale: procedure needed and proper for the diagnosis and/or treatment of the patient's medical symptoms and needs. 1. Lumbar facet syndrome (Bilateral) (R>L)   2. Spondylosis without myelopathy or radiculopathy, lumbosacral region   3. Grade 1 Anterolisthesis of L5 over S1   4. DDD (degenerative disc disease), lumbosacral   5. L5 pars defect with spondylolisthesis (Bilateral)   6. Chronic sacroiliac joint pain (Bilateral) (R>L)   7. Enthesopathy of sacroiliac joint   8. Osteoarthritis of sacroiliac joints (Bilateral)   9. Somatic dysfunction of sacroiliac joints (Bilateral)   10. Other spondylosis, sacral and sacrococcygeal region   11. Chronic low back pain (Bilateral) w/o sciatica    NAS-11 Pain score:   Pre-procedure: 4 /10   Post-procedure: 5 /10      Effectiveness:  Initial hour after procedure: 95 % ***. Subsequent 4-6 hours post-procedure: 95 % ***. Analgesia past initial 6 hours: 95 % (ongoing) ***. Ongoing improvement:  Analgesic:  *** Function:    ***    ROM:    ***     Pharmacotherapy Assessment   Opioid Analgesic: No opioid analgesics from our practice.   Monitoring: [place] PMP: PDMP reviewed during this encounter.       Pharmacotherapy: No side-effects or adverse reactions  reported. Compliance: No problems identified. Effectiveness: Clinically acceptable. Plan: Refer to "POC". UDS: No results found for: "SUMMARY" No results found for: "CBDTHCR", "D8THCCBX", "D9THCCBX"   Laboratory Chemistry Profile   Renal Lab Results  Component Value Date   BUN 12 07/04/2021   CREATININE 0.71 07/04/2021   BCR 17 07/04/2021   GFRAA 94 05/17/2020   GFRNONAA 81 05/17/2020    Hepatic Lab Results  Component Value Date   AST 28 03/17/2021   ALT 26 03/17/2021   ALBUMIN 5.1 (H) 03/17/2021   ALKPHOS 75 03/17/2021    Electrolytes Lab Results  Component Value Date   NA 141 07/04/2021   K 5.0 07/04/2021   CL 101 07/04/2021   CALCIUM 10.1 07/04/2021   MG 2.1 01/03/2017    Bone Lab Results  Component Value Date   25OHVITD1 50 01/03/2017   25OHVITD2 <1.0 01/03/2017   25OHVITD3 50 01/03/2017    Inflammation (CRP: Acute Phase) (ESR: Chronic Phase) Lab Results  Component Value Date   CRP 0.8 01/03/2017   ESRSEDRATE 2 01/25/2020         Note: Above Lab results reviewed.  Imaging  DG PAIN CLINIC C-ARM 1-60 MIN NO REPORT Fluoro was used, but no Radiologist interpretation will be provided.  Please refer to "NOTES" tab for provider progress note.  Assessment  There were no encounter diagnoses.  Plan of Care  Problem-specific:  No problem-specific Assessment & Plan notes found for this encounter.  Ashley Rodriguez has a current medication list which includes the following long-term medication(s): amlodipine, atenolol, calcium carbonate, enbrel sureclick, esomeprazole, ezetimibe, fexofenadine, fluticasone, gabapentin, losartan, and montelukast.  Pharmacotherapy (Medications Ordered): No orders of the defined types were placed in this encounter.  Orders:  No orders of the defined types were placed in this encounter.  Follow-up plan:  No follow-ups on file.     Interventional Therapies  Risk  Complexity Considerations:   WNL   Planned  Pending:    Therapeutic/palliative bilateral lumbar facet MBB #6 + sacroiliac joint Blk #4  Therapeutic/palliative bilateral lumbar facet + sacroiliac joint RFA #2    Under consideration:   Therapeutic/palliative bilateral lumbar facet + sacroiliac joint RFA #2    Completed:   Diagnostic/therapeutic right L4-5 LESI x1 (01/22/2019) (50/50/90 x2 weeks)  Diagnostic bilateral SI Blk x2 (02/09/2020) (100/100/80) (100/100/100/50)  Therapeutic right SI RFA x1 (03/17/2020) (100/100/100/90-100)  Therapeutic left SI RFA x1 (03/31/2020) (100/100/100 x3 weeks/70)  Diagnostic bilateral lumbar facet MBB x2 (01/22/2017; 04/18/2017) (100/100/90/90) (100/90/100/100)  Palliative bilateral lumbar facet MBB x3 (10/09/2018) (100/100/95/90-100/duration of up to 8 months)  Therapeutic right lumbar facet RFA x1 (07/21/2019) (100/100/75)  Therapeutic left lumbar facet RFA x1 (08/06/2019)  (100/100/75)  Therapeutic bilateral lumbar facet and SI joint Blk x1 (05/18/2021) (100/100/100) patient states that the combination works better than individually.   Palliative options:   Palliative lumbar facet MBB  Therapeutic lumbar facet RFA  Therapeutic SI RFA  Palliative SI joint Blk  Therapeutic L4-5 LESI       Recent Visits Date Type Provider Dept  11/09/21 Procedure visit Milinda Pointer, MD [facility]  10/09/21 Office Visit Milinda Pointer, MD [facility]  Showing recent visits within past 90 days and meeting all other requirements Future Appointments Date Type Provider Dept  11/28/21 Office Visit Milinda Pointer, MD [facility]  Showing future appointments within next 90 days and meeting all other requirements  I discussed the assessment and treatment plan with the patient. The patient was provided an opportunity to ask questions and all were answered. The patient agreed with the plan and demonstrated an understanding of the instructions.  Patient advised to call back or seek an  in-person evaluation if the symptoms or condition worsens.  Duration of encounter: *** minutes.  Note by: Gaspar Cola, MD Date: 11/28/2021; Time: 8:56 AM

## 2021-11-28 ENCOUNTER — Ambulatory Visit: Payer: Medicare Other | Attending: Pain Medicine | Admitting: Pain Medicine

## 2021-11-28 DIAGNOSIS — M779 Enthesopathy, unspecified: Secondary | ICD-10-CM

## 2021-11-28 DIAGNOSIS — M47817 Spondylosis without myelopathy or radiculopathy, lumbosacral region: Secondary | ICD-10-CM | POA: Diagnosis not present

## 2021-11-28 DIAGNOSIS — M47816 Spondylosis without myelopathy or radiculopathy, lumbar region: Secondary | ICD-10-CM | POA: Diagnosis not present

## 2021-11-28 DIAGNOSIS — G8929 Other chronic pain: Secondary | ICD-10-CM | POA: Diagnosis not present

## 2021-11-28 DIAGNOSIS — M545 Low back pain, unspecified: Secondary | ICD-10-CM | POA: Diagnosis not present

## 2021-11-28 DIAGNOSIS — M461 Sacroiliitis, not elsewhere classified: Secondary | ICD-10-CM | POA: Diagnosis not present

## 2021-11-28 DIAGNOSIS — M533 Sacrococcygeal disorders, not elsewhere classified: Secondary | ICD-10-CM

## 2021-11-28 DIAGNOSIS — M4310 Spondylolisthesis, site unspecified: Secondary | ICD-10-CM | POA: Diagnosis not present

## 2021-11-28 DIAGNOSIS — M5137 Other intervertebral disc degeneration, lumbosacral region: Secondary | ICD-10-CM | POA: Diagnosis not present

## 2021-11-28 DIAGNOSIS — M9904 Segmental and somatic dysfunction of sacral region: Secondary | ICD-10-CM | POA: Diagnosis not present

## 2021-12-04 DIAGNOSIS — M9903 Segmental and somatic dysfunction of lumbar region: Secondary | ICD-10-CM | POA: Diagnosis not present

## 2021-12-04 DIAGNOSIS — M542 Cervicalgia: Secondary | ICD-10-CM | POA: Diagnosis not present

## 2021-12-04 DIAGNOSIS — M9901 Segmental and somatic dysfunction of cervical region: Secondary | ICD-10-CM | POA: Diagnosis not present

## 2021-12-04 DIAGNOSIS — M5416 Radiculopathy, lumbar region: Secondary | ICD-10-CM | POA: Diagnosis not present

## 2021-12-19 ENCOUNTER — Other Ambulatory Visit: Payer: Self-pay | Admitting: Family Medicine

## 2021-12-25 DIAGNOSIS — M5416 Radiculopathy, lumbar region: Secondary | ICD-10-CM | POA: Diagnosis not present

## 2021-12-25 DIAGNOSIS — M9903 Segmental and somatic dysfunction of lumbar region: Secondary | ICD-10-CM | POA: Diagnosis not present

## 2021-12-25 DIAGNOSIS — M9901 Segmental and somatic dysfunction of cervical region: Secondary | ICD-10-CM | POA: Diagnosis not present

## 2021-12-25 DIAGNOSIS — M542 Cervicalgia: Secondary | ICD-10-CM | POA: Diagnosis not present

## 2021-12-26 DIAGNOSIS — R7989 Other specified abnormal findings of blood chemistry: Secondary | ICD-10-CM | POA: Diagnosis not present

## 2022-01-09 ENCOUNTER — Encounter: Payer: Medicare Other | Admitting: Family Medicine

## 2022-01-15 DIAGNOSIS — M9903 Segmental and somatic dysfunction of lumbar region: Secondary | ICD-10-CM | POA: Diagnosis not present

## 2022-01-15 DIAGNOSIS — M9901 Segmental and somatic dysfunction of cervical region: Secondary | ICD-10-CM | POA: Diagnosis not present

## 2022-01-15 DIAGNOSIS — M542 Cervicalgia: Secondary | ICD-10-CM | POA: Diagnosis not present

## 2022-01-15 DIAGNOSIS — M5416 Radiculopathy, lumbar region: Secondary | ICD-10-CM | POA: Diagnosis not present

## 2022-01-23 ENCOUNTER — Other Ambulatory Visit: Payer: Self-pay | Admitting: Family Medicine

## 2022-01-23 DIAGNOSIS — Z1231 Encounter for screening mammogram for malignant neoplasm of breast: Secondary | ICD-10-CM

## 2022-02-05 ENCOUNTER — Other Ambulatory Visit: Payer: Self-pay | Admitting: Family Medicine

## 2022-02-05 DIAGNOSIS — M5416 Radiculopathy, lumbar region: Secondary | ICD-10-CM | POA: Diagnosis not present

## 2022-02-05 DIAGNOSIS — M542 Cervicalgia: Secondary | ICD-10-CM | POA: Diagnosis not present

## 2022-02-05 DIAGNOSIS — M9901 Segmental and somatic dysfunction of cervical region: Secondary | ICD-10-CM | POA: Diagnosis not present

## 2022-02-05 DIAGNOSIS — M9903 Segmental and somatic dysfunction of lumbar region: Secondary | ICD-10-CM | POA: Diagnosis not present

## 2022-02-26 DIAGNOSIS — M9901 Segmental and somatic dysfunction of cervical region: Secondary | ICD-10-CM | POA: Diagnosis not present

## 2022-02-26 DIAGNOSIS — M5416 Radiculopathy, lumbar region: Secondary | ICD-10-CM | POA: Diagnosis not present

## 2022-02-26 DIAGNOSIS — M9903 Segmental and somatic dysfunction of lumbar region: Secondary | ICD-10-CM | POA: Diagnosis not present

## 2022-02-26 DIAGNOSIS — M542 Cervicalgia: Secondary | ICD-10-CM | POA: Diagnosis not present

## 2022-03-05 NOTE — Progress Notes (Unsigned)
I,Murad Staples S Antion Andres,acting as a Education administrator for Lavon Paganini, MD.,have documented all relevant documentation on the behalf of Lavon Paganini, MD,as directed by  Lavon Paganini, MD while in the presence of Lavon Paganini, MD.    Complete physical exam   Patient: Ashley Rodriguez   DOB: 03-01-1951   71 y.o. Female  MRN: [id number] Visit Date: 03/06/2022  Today's healthcare provider: Lavon Paganini, MD   Chief Complaint  Patient presents with   Annual Exam   Subjective    Samariyah Cowles is a 71 y.o. female who presents today for a complete physical exam.   She reports consuming a general diet. Home exercise routine includes golf. She generally feels well. She reports sleeping fairly well. She does have additional problems to discuss today.   HPI  07/10/21 AWV Diabetic eye exam requested from [facility]. Patient reports getting shingles vaccine at [facility].   Tinnitus - hasn't seen ENT in a long time  Wonders about Ozempic for weight loss - discussed pros and cons  L 5th metatarsal fracture in July 23 - healed well  More fatigue  Stress incontinence  Past Medical History:  Diagnosis Date   COVID-19 12/15/2018   Flank lipoma 08/17/2016   Hypertension    Neck mass 04/07/2013   Psoriatic arthritis ([place])    Recurrent cellulitis    T2DM (type 2 diabetes mellitus) ([place]) 10/01/2018   Past Surgical History:  Procedure Laterality Date   ABDOMINAL HYSTERECTOMY  2000   COLONOSCOPY  2015   lipoma removed  04/2012   on neck   NECK MASS EXCISION  04/28/14   lipoma   Social History   Socioeconomic History   Marital status: Significant Other    Spouse name: Not on file   Number of children: 1   Years of education: Not on file   Highest education level: Some college, no degree  Occupational History   Occupation: Marine scientist    Comment: retired  Tobacco Use   Smoking status: Never   Smokeless tobacco: Never  Vaping Use   Vaping Use:  Never used  Substance and Sexual Activity   Alcohol use: Yes    Alcohol/week: 0.0 - 4.0 standard drinks of alcohol    Comment: no more han 2 drinks at a time   Drug use: No   Sexual activity: Not on file  Other Topics Concern   Not on file  Social History Narrative   Not on file   Social Determinants of Health   Financial Resource Strain: Low Risk  (07/10/2021)   Overall Financial Resource Strain (CARDIA)    Difficulty of Paying Living Expenses: Not hard at all  Food Insecurity: No Food Insecurity (07/10/2021)   Hunger Vital Sign    Worried About Running Out of Food in the Last Year: Never true    [place] in the Last Year: Never true  Transportation Needs: No Transportation Needs (07/10/2021)   PRAPARE - Hydrologist (Medical): No    Lack of Transportation (Non-Medical): No  Physical Activity: Sufficiently Active (07/10/2021)   Exercise Vital Sign    Days of Exercise per Week: 3 days    Minutes of Exercise per Session: 60 min  Stress: No Stress Concern Present (07/10/2021)   [place]    Feeling of Stress : Only a little  Social Connections: Moderately Integrated (07/10/2021)   Social Connection and Isolation Panel [NHANES]  Frequency of Communication with Friends and Family: More than three times a week    Frequency of Social Gatherings with Friends and Family: More than three times a week    Attends Religious Services: More than 4 times per year    Active Member of [employer] or Organizations: No    Attends Archivist Meetings: Never    Marital Status: Living with partner  Intimate Partner Violence: Not At Risk (07/10/2021)   Humiliation, Afraid, Rape, and Kick questionnaire    Fear of Current or Ex-Partner: No    Emotionally Abused: No    Physically Abused: No    Sexually Abused: No   Family Status  Relation Name Status   Mother  Deceased   Father  Deceased    Sister  Alive   Brother  Alive   Brother  Alive   Nephew  Alive   Neg Hx  (Not Specified)   Family History  Problem Relation Age of Onset   Hypertension Mother    Atrial fibrillation Mother    Cancer Father        bladder   Atrial fibrillation Brother    Supraventricular tachycardia Brother    Arrhythmia Nephew    Breast cancer Neg Hx    Colon cancer Neg Hx    Allergies  Allergen Reactions   Atorvastatin Other (See Comments)    Myalgias   Sulfamethoxazole-Trimethoprim     Other reaction(s): Unknown   Codeine Rash    Other reaction(s): Unknown   Sulfa Antibiotics Rash    Patient Care Team: Virginia Crews, MD as PCP - General (Family Medicine) Anell Barr, [place] (Optometry) Milinda Pointer, MD as Referring Physician (Pain Medicine)   Medications: Outpatient Medications Prior to Visit  Medication Sig   acetaminophen (TYLENOL) 325 MG tablet Take 325 mg by mouth every 6 (six) hours as needed.   amLODipine (NORVASC) 5 MG tablet TAKE 1 TABLET BY MOUTH ONCE DAILY AS NEEDED FOR BLOOD PRESSURE   atenolol (TENORMIN) 50 MG tablet TAKE 1 TABLET BY MOUTH ONCE DAILY   betamethasone dipropionate 0.05 % lotion Apply 1 application topically as needed.    calcium carbonate (OS-CAL) 600 MG TABS tablet Take 1,200 mg by mouth daily with breakfast.   co-enzyme Q-10 30 MG capsule Take 100 mg by mouth daily.   ENBREL SURECLICK 50 MG/ML injection Inject 50 mg into the skin once a week.    esomeprazole (NEXIUM) 40 MG capsule TAKE 1 CAPSULE BY MOUTH ONCE DAILY   ezetimibe (ZETIA) 10 MG tablet Take 1 tablet (10 mg total) by mouth 3 (three) times a week. (Patient taking differently: Take 10 mg by mouth 2 (two) times a week.)   fexofenadine (ALLEGRA) 180 MG tablet Take 180 mg by mouth daily.   gabapentin (NEURONTIN) 300 MG capsule TAKE 2 CAPSULES BY MOUTH EVERY MORNING AND 3 CAPSULES BY MOUTH EVERY NIGHT AT BEDTIME   losartan (COZAAR) 25 MG tablet TAKE 1/2 TABLET (12.5 MG TOTAL) BY  MOUTHONCE DAILY   montelukast (SINGULAIR) 10 MG tablet TAKE 1 TABLET BY MOUTH AT BEDTIME   Multiple Vitamins-Minerals (MULTIVITAMIN WITH MINERALS) tablet Take 1 tablet by mouth daily.   TURMERIC PO Take by mouth daily at 6 (six) AM.   valACYclovir (VALTREX) 1000 MG tablet TAKE 2 TABLETS BY MOUTH TWICE DAILY FOR 1 DAY WHEN FEVER BLISTER OCCURS   [DISCONTINUED] fluticasone (FLONASE) 50 MCG/ACT nasal spray PLACE 2 SPRAYS INTO EACH NOSTRIL ONCE DAILY (Patient not taking: Reported on 03/06/2022)  Facility-Administered Medications Prior to Visit  Medication Dose Route Frequency Provider   triamcinolone acetonide (KENALOG-40) injection 60 mg  60 mg Other Once [place], Max T, DPM    Review of Systems  All other systems reviewed and are negative.   Last CBC Lab Results  Component Value Date   WBC 5.1 05/19/2020   HGB 14.6 05/19/2020   HCT 43 05/19/2020   MCV 96 01/25/2020   MCH 32.6 01/25/2020   RDW 13.1 01/25/2020   PLT 395 01/60/1093   Last metabolic panel Lab Results  Component Value Date   GLUCOSE 109 (H) 07/04/2021   NA 141 07/04/2021   K 5.0 07/04/2021   CL 101 07/04/2021   CO2 25 07/04/2021   BUN 12 07/04/2021   CREATININE 0.71 07/04/2021   EGFR 93 07/04/2021   CALCIUM 10.1 07/04/2021   PROT 7.3 03/17/2021   ALBUMIN 5.1 (H) 03/17/2021   LABGLOB 2.2 03/17/2021   AGRATIO 2.3 (H) 03/17/2021   BILITOT 0.5 03/17/2021   ALKPHOS 75 03/17/2021   AST 28 03/17/2021   ALT 26 03/17/2021   Last lipids Lab Results  Component Value Date   CHOL 194 03/17/2021   HDL 50 03/17/2021   LDLCALC 117 (H) 03/17/2021   TRIG 151 (H) 03/17/2021   CHOLHDL 3.9 03/17/2021   Last hemoglobin A1c Lab Results  Component Value Date   HGBA1C 5.9 (A) 07/04/2021   Last thyroid functions Lab Results  Component Value Date   TSH 3.55 02/26/2017   Last vitamin D Lab Results  Component Value Date   25OHVITD2 <1.0 01/03/2017   25OHVITD3 50 01/03/2017   Last vitamin B12 and Folate Lab Results   Component Value Date   VITAMINB12 740 01/03/2017      Objective    BP 129/64 (BP Location: Left Arm, Patient Position: Sitting, Cuff Size: Normal)   Temp 97.9 F (36.6 C) (Oral)   Resp 16   Ht 5' 1.5" (1.562 m)   Wt 153 lb 11.2 oz (69.7 kg)   BMI 28.57 kg/m  BP Readings from Last 3 Encounters:  03/06/22 129/64  11/09/21 (!) 173/75  10/09/21 (!) 148/84   Wt Readings from Last 3 Encounters:  03/06/22 153 lb 11.2 oz (69.7 kg)  11/09/21 152 lb (68.9 kg)  10/09/21 156 lb (70.8 kg)       Physical Exam Vitals reviewed. Exam conducted with a chaperone present.  Constitutional:      General: She is not in acute distress.    Appearance: Normal appearance. She is well-developed. She is not diaphoretic.  HENT:     Head: Normocephalic and atraumatic.     Right Ear: Tympanic membrane, ear canal and external ear normal.     Left Ear: Tympanic membrane, ear canal and external ear normal.     Nose: Nose normal.     Mouth/Throat:     Mouth: Mucous membranes are moist.     Pharynx: Oropharynx is clear. No oropharyngeal exudate.  Eyes:     General: No scleral icterus.    Conjunctiva/sclera: Conjunctivae normal.     Pupils: Pupils are equal, round, and reactive to light.  Neck:     Thyroid: No thyromegaly.  Cardiovascular:     Rate and Rhythm: Normal rate and regular rhythm.     Pulses: Normal pulses.     Heart sounds: Normal heart sounds. No murmur heard. Pulmonary:     Effort: Pulmonary effort is normal. No respiratory distress.     Breath sounds: Normal  breath sounds. No wheezing or rales.  Abdominal:     General: There is no distension.     Palpations: Abdomen is soft.     Tenderness: There is no abdominal tenderness.  Genitourinary:    Vagina: No vaginal discharge or bleeding.     Cervix: Normal.     Comments: Cystocele - worse with coughing Musculoskeletal:        General: No deformity.     Cervical back: Neck supple.     Right lower leg: No edema.     Left lower  leg: No edema.  Lymphadenopathy:     Cervical: No cervical adenopathy.  Skin:    General: Skin is warm and dry.     Findings: No rash.  Neurological:     Mental Status: She is alert and oriented to person, place, and time. Mental status is at baseline.     Sensory: No sensory deficit.     Motor: No weakness.     Gait: Gait normal.  Psychiatric:        Mood and Affect: Mood normal.        Behavior: Behavior normal.        Thought Content: Thought content normal.       Last depression screening scores    03/06/2022   10:46 AM 07/10/2021    9:43 AM 07/04/2021   10:42 AM  PHQ 2/9 Scores  PHQ - 2 Score 0 0 0  PHQ- 9 Score 9 0 4   Last fall risk screening    03/06/2022   10:46 AM  [place] in the past year? 0  Number falls in past yr: 0  Injury with Fall? 0  Risk for fall due to : No Fall Risks  Follow up Falls evaluation completed   Last Audit-C alcohol use screening    03/06/2022   10:50 AM  Alcohol Use Disorder Test (AUDIT)  1. How often do you have a drink containing alcohol? 3  2. How many drinks containing alcohol do you have on a typical day when you are drinking? 1  3. How often do you have six or more drinks on one occasion? 0  AUDIT-C Score 4   A score of 3 or more in women, and 4 or more in men indicates increased risk for alcohol abuse, EXCEPT if all of the points are from question 1   No results found for any visits on 03/06/22.  Assessment & Plan    Routine Health Maintenance and Physical Exam  Exercise Activities and Dietary recommendations  Goals      DIET - EAT MORE FRUITS AND VEGETABLES     DIET - REDUCE SUGAR INTAKE     Recommend to monitor and cut back on sugar intake in daily diet.         Immunization History  Administered Date(s) Administered   Fluad Quad(high Dose 65+) 01/01/2019, 01/25/2020   Influenza-Unspecified 01/13/2021, 01/15/2022   PFIZER(Purple Top)SARS-COV-2 Vaccination 06/15/2019, 07/06/2019, 02/15/2020    Pneumococcal Conjugate-13 09/20/2014   Pneumococcal Polysaccharide-23 01/09/2012, 03/13/2018   Td 09/08/2013   Tdap 12/31/2005   Zoster Recombinat (Shingrix) 01/13/2021, 04/25/2021    Health Maintenance  Topic Date Due   COVID-19 Vaccine (4 - Pfizer risk series) 04/11/2020   HEMOGLOBIN A1C  01/04/2022   OPHTHALMOLOGY EXAM  06/19/2022   Diabetic kidney evaluation - GFR measurement  07/05/2022   Diabetic kidney evaluation - Urine ACR  07/05/2022   Medicare Annual Wellness (AWV)  07/11/2022   FOOT EXAM  03/07/2023   MAMMOGRAM  04/04/2023   TETANUS/TDAP  09/09/2023   DEXA SCAN  11/11/2023   COLONOSCOPY (Pts 45-57yr Insurance coverage will need to be confirmed)  02/27/2024   Pneumonia Vaccine 71 Years old  Completed   INFLUENZA VACCINE  Completed   Hepatitis C Screening  Completed   Zoster Vaccines- Shingrix  Completed   HPV VACCINES  Aged Out    Discussed health benefits of physical activity, and encouraged her to engage in regular exercise appropriate for her age and condition.  Problem List Items Addressed This Visit       Cardiovascular and Mediastinum   Hypertension associated with diabetes ([place]    Well controlled Continue current medications Recheck metabolic panel F/u in 6 months       Relevant Orders   Comprehensive metabolic panel     Endocrine   T2DM (type 2 diabetes mellitus) ([place]    Chronic and well controlled Diet control, on no meds UTD on screenings Recheck A1c      Relevant Orders   Hemoglobin A1c   Hyperlipidemia associated with type 2 diabetes mellitus (HCC)    Continue Zetia and CoQ10 H/o statin induced myalgias Recheck FLP and CMP      Relevant Orders   Comprehensive metabolic panel   Lipid Panel With LDL/HDL Ratio     Genitourinary   Cystocele, midline    Longstanding symptoms but new diagnosis Having worsening stress incontinence [place] Offered pelvic floor PT referral - patient  declines at this time      Relevant Orders   Ambulatory referral to Urogynecology     Other   Fatigue    Longstanding Check labs for possible etiologies      Relevant Orders   CBC with Differential/Platelet   TSH   VITAMIN D 25 Hydroxy (Vit-D Deficiency, Fractures)   Vitamin B12   Ambulatory referral to Sleep Studies   Tinnitus of both ears    Referral back to ENT for further eval and management      Relevant Orders   Ambulatory referral to ENT   Stress incontinence    Urogyn referral as above      Breast pain, right    New problem Will get diagnostic mammo and [place]     Relevant Orders   MM DIAG BREAST TOMO BILATERAL   [place] LTD UNI RIGHT INC AXILLA   Excessive daytime sleepiness    Need to get sleep study Concern for possible OSA Will treat pending results      Relevant Orders   Ambulatory referral to Sleep Studies   Other Visit Diagnoses     Encounter for annual physical exam    -  Primary   Relevant Orders   Comprehensive metabolic panel   Lipid Panel With LDL/HDL Ratio   Hemoglobin A1c   CBC with Differential/Platelet   TSH   VITAMIN D 25 Hydroxy (Vit-D Deficiency, Fractures)   Vitamin B12        Return in about 6 months (around 09/04/2022) for chronic disease f/u.     I, ALavon Paganini MD, have reviewed all documentation for this visit. The documentation on 03/06/22 for the exam, diagnosis, procedures, and orders are all accurate and complete.   [place], ADionne Bucy MD, MPH [facility]

## 2022-03-06 ENCOUNTER — Ambulatory Visit (INDEPENDENT_AMBULATORY_CARE_PROVIDER_SITE_OTHER): Payer: Medicare Other | Admitting: Family Medicine

## 2022-03-06 ENCOUNTER — Encounter: Payer: Self-pay | Admitting: Family Medicine

## 2022-03-06 VITALS — BP 129/64 | Temp 97.9°F | Resp 16 | Ht 61.5 in | Wt 153.7 lb

## 2022-03-06 DIAGNOSIS — H9313 Tinnitus, bilateral: Secondary | ICD-10-CM

## 2022-03-06 DIAGNOSIS — E785 Hyperlipidemia, unspecified: Secondary | ICD-10-CM | POA: Diagnosis not present

## 2022-03-06 DIAGNOSIS — Z0000 Encounter for general adult medical examination without abnormal findings: Secondary | ICD-10-CM

## 2022-03-06 DIAGNOSIS — N644 Mastodynia: Secondary | ICD-10-CM | POA: Diagnosis not present

## 2022-03-06 DIAGNOSIS — E1159 Type 2 diabetes mellitus with other circulatory complications: Secondary | ICD-10-CM

## 2022-03-06 DIAGNOSIS — I152 Hypertension secondary to endocrine disorders: Secondary | ICD-10-CM

## 2022-03-06 DIAGNOSIS — N8111 Cystocele, midline: Secondary | ICD-10-CM

## 2022-03-06 DIAGNOSIS — R5382 Chronic fatigue, unspecified: Secondary | ICD-10-CM | POA: Diagnosis not present

## 2022-03-06 DIAGNOSIS — N393 Stress incontinence (female) (male): Secondary | ICD-10-CM

## 2022-03-06 DIAGNOSIS — Z23 Encounter for immunization: Secondary | ICD-10-CM

## 2022-03-06 DIAGNOSIS — G4719 Other hypersomnia: Secondary | ICD-10-CM

## 2022-03-06 DIAGNOSIS — E1169 Type 2 diabetes mellitus with other specified complication: Secondary | ICD-10-CM

## 2022-03-06 NOTE — Assessment & Plan Note (Signed)
Longstanding Check labs for possible etiologies

## 2022-03-06 NOTE — Assessment & Plan Note (Addendum)
Chronic and well controlled Diet control, on no meds UTD on screenings Recheck A1c

## 2022-03-06 NOTE — Assessment & Plan Note (Signed)
Need to get sleep study Concern for possible OSA Will treat pending results

## 2022-03-06 NOTE — Assessment & Plan Note (Signed)
Continue Zetia and CoQ10 H/o statin induced myalgias Recheck FLP and CMP

## 2022-03-06 NOTE — Assessment & Plan Note (Signed)
Referral back to ENT for further eval and management

## 2022-03-06 NOTE — Assessment & Plan Note (Signed)
Urogyn referral as above

## 2022-03-06 NOTE — Assessment & Plan Note (Signed)
New problem Will get diagnostic mammo and [name]

## 2022-03-06 NOTE — Assessment & Plan Note (Signed)
Well controlled Continue current medications Recheck metabolic panel F/u in 6 months  

## 2022-03-06 NOTE — Assessment & Plan Note (Signed)
Longstanding symptoms but new diagnosis Having worsening stress incontinence Will refer to [place] for further eval and maangement Offered pelvic floor PT referral - patient declines at this time

## 2022-03-06 NOTE — Addendum Note (Signed)
Addended by: Virginia Crews on: 03/06/2022 01:47 PM   Modules accepted: Orders

## 2022-03-07 DIAGNOSIS — E785 Hyperlipidemia, unspecified: Secondary | ICD-10-CM | POA: Diagnosis not present

## 2022-03-07 DIAGNOSIS — Z0000 Encounter for general adult medical examination without abnormal findings: Secondary | ICD-10-CM | POA: Diagnosis not present

## 2022-03-07 DIAGNOSIS — I152 Hypertension secondary to endocrine disorders: Secondary | ICD-10-CM | POA: Diagnosis not present

## 2022-03-07 DIAGNOSIS — E1159 Type 2 diabetes mellitus with other circulatory complications: Secondary | ICD-10-CM | POA: Diagnosis not present

## 2022-03-07 DIAGNOSIS — E1169 Type 2 diabetes mellitus with other specified complication: Secondary | ICD-10-CM | POA: Diagnosis not present

## 2022-03-08 LAB — CBC WITH DIFFERENTIAL/PLATELET
Basophils Absolute: 0.1 10*3/uL (ref 0.0–0.2)
Basos: 1 %
EOS (ABSOLUTE): 0.2 10*3/uL (ref 0.0–0.4)
Eos: 5 %
Hematocrit: 45.1 % (ref 34.0–46.6)
Hemoglobin: 14.9 g/dL (ref 11.1–15.9)
Immature Grans (Abs): 0 10*3/uL (ref 0.0–0.1)
Immature Granulocytes: 0 %
Lymphocytes Absolute: 2.1 10*3/uL (ref 0.7–3.1)
Lymphs: 45 %
MCH: 32 pg (ref 26.6–33.0)
MCHC: 33 g/dL (ref 31.5–35.7)
MCV: 97 fL (ref 79–97)
Monocytes Absolute: 0.5 10*3/uL (ref 0.1–0.9)
Monocytes: 10 %
Neutrophils Absolute: 1.9 10*3/uL (ref 1.4–7.0)
Neutrophils: 39 %
Platelets: 382 10*3/uL (ref 150–450)
RBC: 4.66 x10E6/uL (ref 3.77–5.28)
RDW: 12.3 % (ref 11.7–15.4)
WBC: 4.8 10*3/uL (ref 3.4–10.8)

## 2022-03-08 LAB — HEMOGLOBIN A1C
Est. average glucose Bld gHb Est-mCnc: 131 mg/dL
Hgb A1c MFr Bld: 6.2 % — ABNORMAL HIGH (ref 4.8–5.6)

## 2022-03-08 LAB — COMPREHENSIVE METABOLIC PANEL
ALT: 39 IU/L — ABNORMAL HIGH (ref 0–32)
AST: 36 IU/L (ref 0–40)
Albumin/Globulin Ratio: 2.1 (ref 1.2–2.2)
Albumin: 4.9 g/dL (ref 3.9–4.9)
Alkaline Phosphatase: 67 IU/L (ref 44–121)
BUN/Creatinine Ratio: 17 (ref 12–28)
BUN: 11 mg/dL (ref 8–27)
Bilirubin Total: 0.6 mg/dL (ref 0.0–1.2)
CO2: 23 mmol/L (ref 20–29)
Calcium: 9.6 mg/dL (ref 8.7–10.3)
Chloride: 99 mmol/L (ref 96–106)
Creatinine, Ser: 0.66 mg/dL (ref 0.57–1.00)
Globulin, Total: 2.3 g/dL (ref 1.5–4.5)
Glucose: 136 mg/dL — ABNORMAL HIGH (ref 70–99)
Potassium: 4.6 mmol/L (ref 3.5–5.2)
Sodium: 140 mmol/L (ref 134–144)
Total Protein: 7.2 g/dL (ref 6.0–8.5)
eGFR: 95 mL/min/{1.73_m2} (ref >59)

## 2022-03-08 LAB — VITAMIN D 25 HYDROXY (VIT D DEFICIENCY, FRACTURES): Vit D, 25-Hydroxy: 47.6 ng/mL (ref 30.0–100.0)

## 2022-03-08 LAB — VITAMIN B12: Vitamin B-12: 678 pg/mL (ref 232–1245)

## 2022-03-08 LAB — LIPID PANEL WITH LDL/HDL RATIO
Cholesterol, Total: 205 mg/dL — ABNORMAL HIGH (ref 100–199)
HDL: 47 mg/dL (ref >39)
LDL Chol Calc (NIH): 128 mg/dL — ABNORMAL HIGH (ref 0–99)
LDL/HDL Ratio: 2.7 ratio (ref 0.0–3.2)
Triglycerides: 171 mg/dL — ABNORMAL HIGH (ref 0–149)
VLDL Cholesterol Cal: 30 mg/dL (ref 5–40)

## 2022-03-08 LAB — TSH: TSH: 4.59 u[IU]/mL — ABNORMAL HIGH (ref 0.450–4.500)

## 2022-03-12 LAB — T4, FREE: Free T4: 1.19 ng/dL (ref 0.82–1.77)

## 2022-03-12 LAB — SPECIMEN STATUS REPORT

## 2022-03-14 ENCOUNTER — Other Ambulatory Visit: Payer: Self-pay | Admitting: Family Medicine

## 2022-03-19 DIAGNOSIS — L812 Freckles: Secondary | ICD-10-CM | POA: Diagnosis not present

## 2022-03-19 DIAGNOSIS — L570 Actinic keratosis: Secondary | ICD-10-CM | POA: Diagnosis not present

## 2022-03-19 DIAGNOSIS — Z1283 Encounter for screening for malignant neoplasm of skin: Secondary | ICD-10-CM | POA: Diagnosis not present

## 2022-03-19 DIAGNOSIS — L400 Psoriasis vulgaris: Secondary | ICD-10-CM | POA: Diagnosis not present

## 2022-03-19 DIAGNOSIS — D485 Neoplasm of uncertain behavior of skin: Secondary | ICD-10-CM | POA: Diagnosis not present

## 2022-03-19 DIAGNOSIS — D225 Melanocytic nevi of trunk: Secondary | ICD-10-CM | POA: Diagnosis not present

## 2022-03-19 DIAGNOSIS — L918 Other hypertrophic disorders of the skin: Secondary | ICD-10-CM | POA: Diagnosis not present

## 2022-03-19 DIAGNOSIS — L821 Other seborrheic keratosis: Secondary | ICD-10-CM | POA: Diagnosis not present

## 2022-03-19 DIAGNOSIS — L4050 Arthropathic psoriasis, unspecified: Secondary | ICD-10-CM | POA: Diagnosis not present

## 2022-03-19 DIAGNOSIS — Z85828 Personal history of other malignant neoplasm of skin: Secondary | ICD-10-CM | POA: Diagnosis not present

## 2022-03-26 DIAGNOSIS — M542 Cervicalgia: Secondary | ICD-10-CM | POA: Diagnosis not present

## 2022-03-26 DIAGNOSIS — M9901 Segmental and somatic dysfunction of cervical region: Secondary | ICD-10-CM | POA: Diagnosis not present

## 2022-03-26 DIAGNOSIS — M5416 Radiculopathy, lumbar region: Secondary | ICD-10-CM | POA: Diagnosis not present

## 2022-03-26 DIAGNOSIS — M9903 Segmental and somatic dysfunction of lumbar region: Secondary | ICD-10-CM | POA: Diagnosis not present

## 2022-03-27 ENCOUNTER — Ambulatory Visit
Admission: RE | Admit: 2022-03-27 | Discharge: 2022-03-27 | Disposition: A | Discharge status: Home / Self Care | Payer: Medicare Other | Source: Ambulatory Visit | Attending: Family Medicine | Admitting: Family Medicine

## 2022-03-27 DIAGNOSIS — N644 Mastodynia: Secondary | ICD-10-CM | POA: Diagnosis not present

## 2022-03-28 ENCOUNTER — Telehealth: Payer: Self-pay

## 2022-03-28 NOTE — Telephone Encounter (Signed)
[facility] sent [name] a fax advisisng that they hane not been unable to reach patients. Left voicemail advising patient to contact them if she would like to proceed with sleep apnea test. [phone]

## 2022-03-29 NOTE — Progress Notes (Signed)
Hello Ashley Rodriguez ,   Your mammogram results are negative   Any questions please reach out to the office or message me on MyChart!  Best, Mardene Speak, PA-C

## 2022-04-01 NOTE — Progress Notes (Unsigned)
PROVIDER NOTE: Information contained herein reflects review and annotations entered in association with encounter. Interpretation of such information and data should be left to medically-trained personnel. Information provided to patient can be located elsewhere in the medical record under "Patient Instructions". Document created using STT-dictation technology, any transcriptional errors that may result from process are unintentional.    Patient: Ashley Rodriguez  Service Category: E/M  Provider: Gaspar Cola, MD  DOB: Mar 27, 1951  DOS: 04/02/2022  Referring Provider: Virginia Crews, MD  MRN: [id number]  Specialty: Interventional Pain Management  PCP: Virginia Crews, MD  Type: Established Patient  Setting: Ambulatory outpatient    Location: Office  Delivery: Face-to-face     HPI  Ms. Tashea Othman, a 71 y.o. year old female, is here today because of her No primary diagnosis found.. Ms. Cassar primary complain today is No chief complaint on file. Last encounter: My last encounter with her was on 11/28/2021. Pertinent problems: Ms. Suares has Psoriatic arthritis ([place]); Chronic low back pain (1ry area of Pain) (Bilateral) (R>L) w/ sciatica (Bilateral); Chronic lower extremity pain (2ry area of Pain) (Bilateral) (R>L); Grade 1 Anterolisthesis of L5 over S1; L5 pars defect with spondylolisthesis (Bilateral); DDD (degenerative disc disease), lumbar; Lumbar facet syndrome (Bilateral) (R>L); Chronic pain syndrome; Chronic musculoskeletal pain; Neurogenic pain; Chronic sacroiliac joint pain (Bilateral) (R>L); Osteoarthritis of lumbar spine; Spondylosis without myelopathy or radiculopathy, lumbosacral region; Peripheral neuropathy; Generalized osteoarthritis of hand; Numbness and tingling of foot; Low back pain radiating to both legs; Abnormal EMG (chronic right lower lumbar polyradiculopathy); Chronic lower lumbar polyradiculopathy (Right); DDD (degenerative disc disease), lumbosacral; Lumbar  spondylosis w/ polyradiculopathy (Right); Lumbosacral radiculopathy (Right); Other intervertebral disc degeneration, lumbar region; Spondylolisthesis of lumbosacral region; Other spondylosis, sacral and sacrococcygeal region; Somatic dysfunction of sacroiliac joints (Bilateral); Osteoarthritis of sacroiliac joints (Bilateral); Myalgia due to statin; Chronic sacroiliac joint pain (Right); Osteoarthritis of sacroiliac joint ([place]) (Right); Enthesopathy of sacroiliac joint; Chronic low back pain (Bilateral) w/o sciatica; Chronic sacroiliac joint pain (Left); Osteoarthritis of sacroiliac joint (Left) ([place]); Somatic dysfunction of sacroiliac joint (Left); and Sciatica, left side on their pertinent problem list. Pain Assessment: Severity of   is reported as a  /10. Location:    / . Onset:  . Quality:  . Timing:  . Modifying factor(s):  Marland Kitchen Vitals:  vitals were not taken for this visit.  BMI: Estimated body mass index is 28.57 kg/m as calculated from the following:   Height as of 03/06/22: 5' 1.5" (1.562 m).   Weight as of 03/06/22: 153 lb 11.2 oz (69.7 kg).  Reason for encounter:  *** . ***  Pharmacotherapy Assessment  Analgesic: No opioid analgesics from our practice.   Monitoring: [place] PMP: PDMP reviewed during this encounter.       Pharmacotherapy: No side-effects or adverse reactions reported. Compliance: No problems identified. Effectiveness: Clinically acceptable.  No notes on file  No results found for: "CBDTHCR" No results found for: "D8THCCBX" No results found for: "D9THCCBX"  UDS:  No results found for: "SUMMARY"    ROS  Constitutional: Denies any fever or chills Gastrointestinal: No reported hemesis, hematochezia, vomiting, or acute GI distress Musculoskeletal: Denies any acute onset joint swelling, redness, loss of ROM, or weakness Neurological: No reported episodes of acute onset apraxia, aphasia, dysarthria, agnosia, amnesia, paralysis, loss of coordination, or loss of  consciousness  Medication Review  Turmeric, acetaminophen, amLODipine, atenolol, betamethasone dipropionate, calcium carbonate, co-enzyme Q-10, esomeprazole, etanercept, ezetimibe, fexofenadine, gabapentin, losartan, montelukast, multivitamin with minerals, and valACYclovir  History  Review  Allergy: Ms. Giaimo is allergic to atorvastatin, sulfamethoxazole-trimethoprim, codeine, and sulfa antibiotics. Drug: Ms. Friedlander  reports no history of drug use. Alcohol:  reports current alcohol use. Tobacco:  reports that she has never smoked. She has never used smokeless tobacco. Social: Ms. Lok  reports that she has never smoked. She has never used smokeless tobacco. She reports current alcohol use. She reports that she does not use drugs. Medical:  has a past medical history of COVID-19 (12/15/2018), Flank lipoma (08/17/2016), Hypertension, Neck mass (04/07/2013), Psoriatic arthritis ([place]), Recurrent cellulitis, and T2DM (type 2 diabetes mellitus) ([place]) (10/01/2018). Surgical: Ms. Macchia  has a past surgical history that includes Abdominal hysterectomy (2000); lipoma removed (04/2012); Colonoscopy (2015); and Neck mass excision (04/28/14). Family: family history includes Arrhythmia in her nephew; Atrial fibrillation in her brother and mother; Cancer in her father; Hypertension in her mother; Supraventricular tachycardia in her brother.  Laboratory Chemistry Profile   Renal Lab Results  Component Value Date   BUN 11 03/07/2022   CREATININE 0.66 03/07/2022   BCR 17 03/07/2022   GFRAA 94 05/17/2020   GFRNONAA 81 05/17/2020    Hepatic Lab Results  Component Value Date   AST 36 03/07/2022   ALT 39 (H) 03/07/2022   ALBUMIN 4.9 03/07/2022   ALKPHOS 67 03/07/2022    Electrolytes Lab Results  Component Value Date   NA 140 03/07/2022   K 4.6 03/07/2022   CL 99 03/07/2022   CALCIUM 9.6 03/07/2022   MG 2.1 01/03/2017    Bone Lab Results  Component Value Date   VD25OH 47.6 03/07/2022   25OHVITD1  50 01/03/2017   25OHVITD2 <1.0 01/03/2017   25OHVITD3 50 01/03/2017    Inflammation (CRP: Acute Phase) (ESR: Chronic Phase) Lab Results  Component Value Date   CRP 0.8 01/03/2017   ESRSEDRATE 2 01/25/2020         Note: Above Lab results reviewed.  Recent Imaging Review  US BREAST LTD UNI RIGHT INC AXILLA CLINICAL DATA:  Focal pain right breast.  EXAM: DIGITAL DIAGNOSTIC BILATERAL MAMMOGRAM WITH TOMOSYNTHESIS; ULTRASOUND RIGHT BREAST LIMITED  TECHNIQUE: Bilateral digital diagnostic mammography and breast tomosynthesis was performed.; Targeted ultrasound examination of the right breast was performed  COMPARISON:  Previous exam(s).  ACR Breast Density Category b: There are scattered areas of fibroglandular density.  FINDINGS: Cc and MLO views of bilateral breasts, spot tangential view of right breast are submitted. No suspicious abnormalities identified bilaterally.  Targeted ultrasound is performed, showing no focal abnormal discrete cystic or solid lesion at focal area pain right breast 8 o'clock.  IMPRESSION: Negative.  RECOMMENDATION: Routine screening mammogram in 1 year.  I have discussed the findings and recommendations with the patient. If applicable, a reminder letter will be sent to the patient regarding the next appointment.  BI-RADS CATEGORY  1: Negative.  Electronically Signed   By: Abelardo Diesel M.D.   On: 03/27/2022 11:40 MM DIAG BREAST TOMO BILATERAL CLINICAL DATA:  Focal pain right breast.  EXAM: DIGITAL DIAGNOSTIC BILATERAL MAMMOGRAM WITH TOMOSYNTHESIS; ULTRASOUND RIGHT BREAST LIMITED  TECHNIQUE: Bilateral digital diagnostic mammography and breast tomosynthesis was performed.; Targeted ultrasound examination of the right breast was performed  COMPARISON:  Previous exam(s).  ACR Breast Density Category b: There are scattered areas of fibroglandular density.  FINDINGS: Cc and MLO views of bilateral breasts, spot tangential view of  right breast are submitted. No suspicious abnormalities identified bilaterally.  Targeted ultrasound is performed, showing no focal abnormal discrete cystic or solid lesion at  focal area pain right breast 8 o'clock.  IMPRESSION: Negative.  RECOMMENDATION: Routine screening mammogram in 1 year.  I have discussed the findings and recommendations with the patient. If applicable, a reminder letter will be sent to the patient regarding the next appointment.  BI-RADS CATEGORY  1: Negative.  Electronically Signed   By: Abelardo Diesel M.D.   On: 03/27/2022 11:40 Note: Reviewed        Physical Exam  General appearance: Well nourished, well developed, and well hydrated. In no apparent acute distress Mental status: Alert, oriented x 3 (person, place, & time)       Respiratory: No evidence of acute respiratory distress Eyes: PERLA Vitals: There were no vitals taken for this visit. BMI: Estimated body mass index is 28.57 kg/m as calculated from the following:   Height as of 03/06/22: 5' 1.5" (1.562 m).   Weight as of 03/06/22: 153 lb 11.2 oz (69.7 kg). Ideal: Patient weight not recorded  Assessment   Diagnosis Status  No diagnosis found. Controlled Controlled Controlled   Updated Problems: No problems updated.  Plan of Care  Problem-specific:  No problem-specific Assessment & Plan notes found for this encounter.  Ms. Emmanuelle Hibbitts has a current medication list which includes the following long-term medication(s): amlodipine, atenolol, calcium carbonate, enbrel sureclick, esomeprazole, ezetimibe, fexofenadine, gabapentin, losartan, and montelukast.  Pharmacotherapy (Medications Ordered): No orders of the defined types were placed in this encounter.  Orders:  No orders of the defined types were placed in this encounter.  Follow-up plan:   No follow-ups on file.     Interventional Therapies  Risk  Complexity Considerations:   Estimated body mass index is 27.8 kg/m as  calculated from the following:   Height as of 11/09/21: _0  (1.575 m).   Weight as of 11/09/21: 152 lb (68.9 kg). WNL   Planned  Pending:      Under consideration:   Therapeutic/palliative bilateral lumbar facet + SI joint RFA #2    Completed:   Diagnostic/therapeutic right L4-5 LESI x1 (01/22/2019) (50/50/90 x2 weeks)  Diagnostic bilateral SI Blk x2 (02/09/2020) (100/100/80) (100/100/100/50)  Therapeutic right SI RFA x1 (03/17/2020) (100/100/100/90-100)  Therapeutic left SI RFA x1 (03/31/2020) (100/100/100 x3 weeks/70)  Diagnostic bilateral lumbar facet MBB x2 (01/22/2017; 04/18/2017) (100/100/90/90) (100/90/100/100)  Palliative bilateral lumbar facet MBB x3 (10/09/2018) (100/100/95/90-100/duration of up to 8 months)  Therapeutic right lumbar facet RFA x1 (07/21/2019) (100/100/75)  Therapeutic left lumbar facet RFA x1 (08/06/2019)  (100/100/75)  Therapeutic bilateral lumbar facet + SI joint Blk x1 (05/18/2021) (100/100/100) patient states that the combination works better than individually. Therapeutic/palliative bilateral lumbar facet MBB x6 + SI Blk x4 (11/09/2021) (100/100/95/95) (lower extremity pain also goes away)   Completed by other providers:   None at this time   Therapeutic  Palliative (PRN) options:   Palliative lumbar facet MBB  Therapeutic lumbar facet RFA  Therapeutic SI RFA  Palliative SI joint Blk  Therapeutic L4-5 LESI      Recent Visits No visits were found meeting these conditions. Showing recent visits within past 90 days and meeting all other requirements Future Appointments Date Type Provider Dept  04/02/22 Appointment Milinda Pointer, MD [facility]  Showing future appointments within next 90 days and meeting all other requirements  I discussed the assessment and treatment plan with the patient. The patient was provided an opportunity to ask questions and all were answered. The patient agreed with the plan and demonstrated an understanding of  the instructions.  Patient  advised to call back or seek an in-person evaluation if the symptoms or condition worsens.  Duration of encounter: *** minutes.  Total time on encounter, as per AMA guidelines included both the face-to-face and non-face-to-face time personally spent by the physician and/or other qualified health care professional(s) on the day of the encounter (includes time in activities that require the physician or other qualified health care professional and does not include time in activities normally performed by clinical staff). Physician's time may include the following activities when performed: preparing to see the patient (eg, review of tests, pre-charting review of records) obtaining and/or reviewing separately obtained history performing a medically appropriate examination and/or evaluation counseling and educating the patient/family/caregiver ordering medications, tests, or procedures referring and communicating with other health care professionals (when not separately reported) documenting clinical information in the electronic or other health record independently interpreting results (not separately reported) and communicating results to the patient/ family/caregiver care coordination (not separately reported)  Note by: Gaspar Cola, MD Date: 04/02/2022; Time: 3:20 PM

## 2022-04-02 ENCOUNTER — Ambulatory Visit: Payer: Medicare Other | Attending: Pain Medicine | Admitting: Pain Medicine

## 2022-04-02 ENCOUNTER — Encounter: Payer: Self-pay | Admitting: Pain Medicine

## 2022-04-02 VITALS — BP 140/76 | HR 62 | Temp 97.2°F | Ht 61.0 in | Wt 150.0 lb

## 2022-04-02 DIAGNOSIS — M545 Low back pain, unspecified: Secondary | ICD-10-CM | POA: Insufficient documentation

## 2022-04-02 DIAGNOSIS — M47817 Spondylosis without myelopathy or radiculopathy, lumbosacral region: Secondary | ICD-10-CM | POA: Insufficient documentation

## 2022-04-02 DIAGNOSIS — M4310 Spondylolisthesis, site unspecified: Secondary | ICD-10-CM | POA: Insufficient documentation

## 2022-04-02 DIAGNOSIS — M47816 Spondylosis without myelopathy or radiculopathy, lumbar region: Secondary | ICD-10-CM | POA: Diagnosis not present

## 2022-04-02 DIAGNOSIS — G8929 Other chronic pain: Secondary | ICD-10-CM | POA: Diagnosis not present

## 2022-04-02 DIAGNOSIS — M5137 Other intervertebral disc degeneration, lumbosacral region: Secondary | ICD-10-CM | POA: Diagnosis not present

## 2022-04-02 NOTE — Progress Notes (Signed)
Safety precautions to be maintained throughout the outpatient stay will include: orient to surroundings, keep bed in low position, maintain call bell within reach at all times, provide assistance with transfer out of bed and ambulation.  

## 2022-04-02 NOTE — Patient Instructions (Signed)
______________________________________________________________________  Preparing for your procedure  During your procedure appointment there will be: No Prescription Refills. No disability issues to discussed. No medication changes or discussions.  Instructions: Food intake: Avoid eating anything solid for at least 8 hours prior to your procedure. Clear liquid intake: You may take clear liquids such as water up to 2 hours prior to your procedure. (No carbonated drinks. No soda.) Transportation: Unless otherwise stated by your physician, bring a driver. Morning Medicines: Except for blood thinners, take all of your other morning medications with a sip of water. Make sure to take your heart and blood pressure medicines. If your blood pressure's lower number is above 100, the case will be rescheduled. Blood thinners: If you take a blood thinner, but were not instructed to stop it, call our office [phone] and ask to talk to a nurse. Not stopping a blood thinner prior to certain procedures could lead to serious complications. Diabetics on insulin: Notify the staff so that you can be scheduled 1st case in the morning. If your diabetes requires high dose insulin, take only  of your normal insulin dose the morning of the procedure and notify the staff that you have done so. Preventing infections: Shower with an antibacterial soap the morning of your procedure.  Build-up your immune system: Take 1000 mg of Vitamin C with every meal (3 times a day) the day prior to your procedure. Antibiotics: Inform the nursing staff if you are taking any antibiotics or if you have any conditions that may require antibiotics prior to procedures. (Example: recent joint implants)   Pregnancy: If you are pregnant make sure to notify the nursing staff. Not doing so may result in injury to the fetus, including death.  Sickness: If you have a cold, fever, or any active infections, call and cancel or reschedule your  procedure. Receiving steroids while having an infection may result in complications. Arrival: You must be in the facility at least 30 minutes prior to your scheduled procedure. Tardiness: Your scheduled time is also the cutoff time. If you do not arrive at least 15 minutes prior to your procedure, you will be rescheduled.  Children: Do not bring any children with you. Make arrangements to keep them home. Dress appropriately: There is always a possibility that your clothing may get soiled. Avoid long dresses. Valuables: Do not bring any jewelry or valuables.  Reasons to call and reschedule or cancel your procedure: (Following these recommendations will minimize the risk of a serious complication.) Surgeries: Avoid having procedures within 2 weeks of any surgery. (Avoid for 2 weeks before or after any surgery). Flu Shots: Avoid having procedures within 2 weeks of a flu shots or . (Avoid for 2 weeks before or after immunizations). Barium: Avoid having a procedure within 7-10 days after having had a radiological study involving the use of radiological contrast. (Myelograms, Barium swallow or enema study). Heart attacks: Avoid any elective procedures or surgeries for the initial 6 months after a "Myocardial Infarction" (Heart Attack). Blood thinners: It is imperative that you stop these medications before procedures. Let us know if you if you take any blood thinner.  Infection: Avoid procedures during or within two weeks of an infection (including chest colds or gastrointestinal problems). Symptoms associated with infections include: Localized redness, fever, chills, night sweats or profuse sweating, burning sensation when voiding, cough, congestion, stuffiness, runny nose, sore throat, diarrhea, nausea, vomiting, cold or Flu symptoms, recent or current infections. It is specially important if the infection is   over the area that we intend to treat. Heart and lung problems: Symptoms that may suggest an  active cardiopulmonary problem include: cough, chest pain, breathing difficulties or shortness of breath, dizziness, ankle swelling, uncontrolled high or unusually low blood pressure, and/or palpitations. If you are experiencing any of these symptoms, cancel your procedure and contact your primary care physician for an evaluation.  Remember:  Regular Business hours are:  Monday to Thursday 8:00 AM to 4:00 PM  Provider's Schedule: Mariana Wiederholt, MD:  Procedure days: Tuesday and Thursday 7:30 AM to 4:00 PM  Bilal Lateef, MD:  Procedure days: Monday and Wednesday 7:30 AM to 4:00 PM  ______________________________________________________________________    ____________________________________________________________________________________________  General Risks and Possible Complications  Patient Responsibilities: It is important that you read this as it is part of your informed consent. It is our duty to inform you of the risks and possible complications associated with treatments offered to you. It is your responsibility as a patient to read this and to ask questions about anything that is not clear or that you believe was not covered in this document.  Patient's Rights: You have the right to refuse treatment. You also have the right to change your mind, even after initially having agreed to have the treatment done. However, under this last option, if you wait until the last second to change your mind, you may be charged for the materials used up to that point.  Introduction: Medicine is not an exact science. Everything in Medicine, including the lack of treatment(s), carries the potential for danger, harm, or loss (which is by definition: Risk). In Medicine, a complication is a secondary problem, condition, or disease that can aggravate an already existing one. All treatments carry the risk of possible complications. The fact that a side effects or complications occurs, does not imply  that the treatment was conducted incorrectly. It must be clearly understood that these can happen even when everything is done following the highest safety standards.  No treatment: You can choose not to proceed with the proposed treatment alternative. The "PRO(s)" would include: avoiding the risk of complications associated with the therapy. The "CON(s)" would include: not getting any of the treatment benefits. These benefits fall under one of three categories: diagnostic; therapeutic; and/or palliative. Diagnostic benefits include: getting information which can ultimately lead to improvement of the disease or symptom(s). Therapeutic benefits are those associated with the successful treatment of the disease. Finally, palliative benefits are those related to the decrease of the primary symptoms, without necessarily curing the condition (example: decreasing the pain from a flare-up of a chronic condition, such as incurable terminal cancer).  General Risks and Complications: These are associated to most interventional treatments. They can occur alone, or in combination. They fall under one of the following six (6) categories: no benefit or worsening of symptoms; bleeding; infection; nerve damage; allergic reactions; and/or death. No benefits or worsening of symptoms: In Medicine there are no guarantees, only probabilities. No healthcare provider can ever guarantee that a medical treatment will work, they can only state the probability that it may. Furthermore, there is always the possibility that the condition may worsen, either directly, or indirectly, as a consequence of the treatment. Bleeding: This is more common if the patient is taking a blood thinner, either prescription or over the counter (example: Goody Powders, Fish oil, Aspirin, Garlic, etc.), or if suffering a condition associated with impaired coagulation (example: Hemophilia, cirrhosis of the liver, low platelet counts, etc.). However, even if   you  do not have one on these, it can still happen. If you have any of these conditions, or take one of these drugs, make sure to notify your treating physician. Infection: This is more common in patients with a compromised immune system, either due to disease (example: diabetes, cancer, human immunodeficiency virus [HIV], etc.), or due to medications or treatments (example: therapies used to treat cancer and rheumatological diseases). However, even if you do not have one on these, it can still happen. If you have any of these conditions, or take one of these drugs, make sure to notify your treating physician. Nerve Damage: This is more common when the treatment is an invasive one, but it can also happen with the use of medications, such as those used in the treatment of cancer. The damage can occur to small secondary nerves, or to large primary ones, such as those in the spinal cord and brain. This damage may be temporary or permanent and it may lead to impairments that can range from temporary numbness to permanent paralysis and/or brain death. Allergic Reactions: Any time a substance or material comes in contact with our body, there is the possibility of an allergic reaction. These can range from a mild skin rash (contact dermatitis) to a severe systemic reaction (anaphylactic reaction), which can result in death. Death: In general, any medical intervention can result in death, most of the time due to an unforeseen complication. ____________________________________________________________________________________________    

## 2022-04-03 DIAGNOSIS — G4730 Sleep apnea, unspecified: Secondary | ICD-10-CM | POA: Diagnosis not present

## 2022-04-10 ENCOUNTER — Encounter: Payer: Self-pay | Admitting: Pain Medicine

## 2022-04-10 ENCOUNTER — Ambulatory Visit
Admission: RE | Admit: 2022-04-10 | Discharge: 2022-04-10 | Disposition: A | Discharge status: Home / Self Care | Payer: Medicare Other | Source: Ambulatory Visit | Attending: Pain Medicine | Admitting: Pain Medicine

## 2022-04-10 ENCOUNTER — Ambulatory Visit: Payer: Medicare Other | Attending: Pain Medicine | Admitting: Pain Medicine

## 2022-04-10 VITALS — BP 155/76 | HR 65 | Temp 97.1°F | Resp 16 | Ht 61.0 in | Wt 150.0 lb

## 2022-04-10 DIAGNOSIS — G8929 Other chronic pain: Secondary | ICD-10-CM | POA: Diagnosis not present

## 2022-04-10 DIAGNOSIS — M4310 Spondylolisthesis, site unspecified: Secondary | ICD-10-CM | POA: Diagnosis not present

## 2022-04-10 DIAGNOSIS — M47816 Spondylosis without myelopathy or radiculopathy, lumbar region: Secondary | ICD-10-CM | POA: Insufficient documentation

## 2022-04-10 DIAGNOSIS — M5137 Other intervertebral disc degeneration, lumbosacral region: Secondary | ICD-10-CM | POA: Insufficient documentation

## 2022-04-10 DIAGNOSIS — M545 Low back pain, unspecified: Secondary | ICD-10-CM | POA: Diagnosis not present

## 2022-04-10 DIAGNOSIS — M47817 Spondylosis without myelopathy or radiculopathy, lumbosacral region: Secondary | ICD-10-CM | POA: Insufficient documentation

## 2022-04-10 MED ORDER — TRIAMCINOLONE ACETONIDE 40 MG/ML IJ SUSP
80.0000 mg | Freq: Once | INTRAMUSCULAR | Status: AC
  Administered 2022-04-10: 80 mg

## 2022-04-10 MED ORDER — LACTATED RINGERS IV SOLN: Freq: Once | INTRAVENOUS | Status: AC

## 2022-04-10 MED ORDER — LIDOCAINE HCL 2 % IJ SOLN
INTRAMUSCULAR | Status: AC
  Filled 2022-04-10: qty 20

## 2022-04-10 MED ORDER — PENTAFLUOROPROP-TETRAFLUOROETH EX AERO
INHALATION_SPRAY | Freq: Once | CUTANEOUS | Status: AC
  Administered 2022-04-10: 30 via TOPICAL
  Filled 2022-04-10: qty 116

## 2022-04-10 MED ORDER — ROPIVACAINE HCL 2 MG/ML IJ SOLN
18.0000 mL | Freq: Once | INTRAMUSCULAR | Status: AC
  Administered 2022-04-10: 18 mL via PERINEURAL

## 2022-04-10 MED ORDER — MIDAZOLAM HCL 2 MG/2ML IJ SOLN
0.5000 mg | Freq: Once | INTRAMUSCULAR | Status: AC
  Administered 2022-04-10: 1.5 mg via INTRAVENOUS

## 2022-04-10 MED ORDER — TRIAMCINOLONE ACETONIDE 40 MG/ML IJ SUSP
INTRAMUSCULAR | Status: AC
  Filled 2022-04-10: qty 2

## 2022-04-10 MED ORDER — LIDOCAINE HCL 2 % IJ SOLN
20.0000 mL | Freq: Once | INTRAMUSCULAR | Status: AC
  Administered 2022-04-10: 400 mg

## 2022-04-10 MED ORDER — ROPIVACAINE HCL 2 MG/ML IJ SOLN
INTRAMUSCULAR | Status: AC
  Filled 2022-04-10: qty 20

## 2022-04-10 MED ORDER — MIDAZOLAM HCL 2 MG/2ML IJ SOLN
INTRAMUSCULAR | Status: AC
  Filled 2022-04-10: qty 2

## 2022-04-10 NOTE — Progress Notes (Signed)
Safety precautions to be maintained throughout the outpatient stay will include: orient to surroundings, keep bed in low position, maintain call bell within reach at all times, provide assistance with transfer out of bed and ambulation.  

## 2022-04-10 NOTE — Progress Notes (Signed)
PROVIDER NOTE: Interpretation of information contained herein should be left to medically-trained personnel. Specific patient instructions are provided elsewhere under "Patient Instructions" section of medical record. This document was created in part using STT-dictation technology, any transcriptional errors that may result from this process are unintentional.  Patient: Ashley Rodriguez Type: Established DOB: [birth year] MRN: [id number] PCP: Virginia Crews, MD  Service: Procedure DOS: 04/10/2022 Setting: Ambulatory Location: Ambulatory outpatient facility Delivery: Face-to-face Provider: Gaspar Cola, MD Specialty: Interventional Pain Management Specialty designation: 09 Location: Outpatient facility Ref. Prov.: Milinda Pointer, MD    Procedure:           Type: Lumbar Facet, Medial Branch Block(s) #3  Laterality: Bilateral  Level: L3, L4, L5, and S1 Medial Branch Level(s). Injecting these levels blocks the L4-5 and L5-S1 lumbar facet joints. Imaging: Fluoroscopic guidance         Anesthesia: Local anesthesia (1-2% Lidocaine) Anxiolysis: IV Versed 1.5 mg Sedation: No Sedation                       DOS: 04/10/2022 Performed by: Gaspar Cola, MD  Primary Purpose: Diagnostic/Therapeutic Indications: Low back pain severe enough to impact quality of life or function. 1. Lumbar facet syndrome (Bilateral) (R>L)   2. Spondylosis without myelopathy or radiculopathy, lumbosacral region   3. L5 pars defect with spondylolisthesis (Bilateral)   4. Grade 1 Anterolisthesis of L5 over S1   5. DDD (degenerative disc disease), lumbosacral   6. Chronic low back pain (Bilateral) w/o sciatica    NAS-11 Pain score:   Pre-procedure: 4 /10   Post-procedure: 2 /10     Position / Prep / Materials:  Position: Prone  Prep solution: DuraPrep (Iodine Povacrylex [0.7% available iodine] and Isopropyl Alcohol, 74% w/w) Area Prepped: Posterolateral Lumbosacral Spine (Wide prep: From the  lower border of the scapula down to the end of the tailbone and from flank to flank.)  Materials:  Tray: Block Needle(s):  Type: Spinal  Gauge (G): 22  Length: 5-in Qty: 4     Pre-op H&P Assessment:  Ashley Rodriguez is a 71 y.o. (year old), female patient, seen today for interventional treatment. She  has a past surgical history that includes Abdominal hysterectomy (2000); lipoma removed (04/2012); Colonoscopy (2015); and Neck mass excision (04/28/14). Ashley Rodriguez has a current medication list which includes the following prescription(s): acetaminophen, amlodipine, atenolol, betamethasone dipropionate, calcium carbonate, co-enzyme I-43, enbrel sureclick, esomeprazole, ezetimibe, fexofenadine, gabapentin, losartan, montelukast, multivitamin with minerals, turmeric, and valacyclovir, and the following Facility-Administered Medications: lactated ringers and triamcinolone acetonide. Her primarily concern today is the Back Pain (right)  Initial Vital Signs:  Pulse/HCG Rate: 65ECG Heart Rate: 66 Temp: (!) 97.1 F (36.2 C) Resp: 18 BP: 135/83 SpO2: 99 %  BMI: Estimated body mass index is 28.34 kg/m as calculated from the following:   Height as of this encounter: '5\' 1"'$  (1.549 m).   Weight as of this encounter: 150 lb (68 kg).  Risk Assessment: Allergies: Reviewed. She is allergic to atorvastatin, sulfamethoxazole-trimethoprim, codeine, and sulfa antibiotics.  Allergy Precautions: None required Coagulopathies: Reviewed. None identified.  Blood-thinner therapy: None at this time Active Infection(s): Reviewed. None identified. Ashley Rodriguez is afebrile  Site Confirmation: Ashley Rodriguez was asked to confirm the procedure and laterality before marking the site Procedure checklist: Completed Consent: Before the procedure and under the influence of no sedative(s), amnesic(s), or anxiolytics, the patient was informed of the treatment options, risks and possible complications. To fulfill our ethical and  legal  obligations, as recommended by the American Medical Association's Code of Ethics, I have informed the patient of my clinical impression; the nature and purpose of the treatment or procedure; the risks, benefits, and possible complications of the intervention; the alternatives, including doing nothing; the risk(s) and benefit(s) of the alternative treatment(s) or procedure(s); and the risk(s) and benefit(s) of doing nothing. The patient was provided information about the general risks and possible complications associated with the procedure. These may include, but are not limited to: failure to achieve desired goals, infection, bleeding, organ or nerve damage, allergic reactions, paralysis, and death. In addition, the patient was informed of those risks and complications associated to Spine-related procedures, such as failure to decrease pain; infection (i.e.: Meningitis, epidural or intraspinal abscess); bleeding (i.e.: epidural hematoma, subarachnoid hemorrhage, or any other type of intraspinal or peri-dural bleeding); organ or nerve damage (i.e.: Any type of peripheral nerve, nerve root, or spinal cord injury) with subsequent damage to sensory, motor, and/or autonomic systems, resulting in permanent pain, numbness, and/or weakness of one or several areas of the body; allergic reactions; (i.e.: anaphylactic reaction); and/or death. Furthermore, the patient was informed of those risks and complications associated with the medications. These include, but are not limited to: allergic reactions (i.e.: anaphylactic or anaphylactoid reaction(s)); adrenal axis suppression; blood sugar elevation that in diabetics may result in ketoacidosis or comma; water retention that in patients with history of congestive heart failure may result in shortness of breath, pulmonary edema, and decompensation with resultant heart failure; weight gain; swelling or edema; medication-induced neural toxicity; particulate matter embolism and  blood vessel occlusion with resultant organ, and/or nervous system infarction; and/or aseptic necrosis of one or more joints. Finally, the patient was informed that Medicine is not an exact science; therefore, there is also the possibility of unforeseen or unpredictable risks and/or possible complications that may result in a catastrophic outcome. The patient indicated having understood very clearly. We have given the patient no guarantees and we have made no promises. Enough time was given to the patient to ask questions, all of which were answered to the patient's satisfaction. Ms. Eckrich has indicated that she wanted to continue with the procedure. Attestation: I, the ordering provider, attest that I have discussed with the patient the benefits, risks, side-effects, alternatives, likelihood of achieving goals, and potential problems during recovery for the procedure that I have provided informed consent. Date  Time: 04/10/2022 11:37 AM  Pre-Procedure Preparation:  Monitoring: As per clinic protocol. Respiration, ETCO2, SpO2, BP, heart rate and rhythm monitor placed and checked for adequate function Safety Precautions: Patient was assessed for positional comfort and pressure points before starting the procedure. Time-out: I initiated and conducted the "Time-out" before starting the procedure, as per protocol. The patient was asked to participate by confirming the accuracy of the "Time Out" information. Verification of the correct person, site, and procedure were performed and confirmed by me, the nursing staff, and the patient. "Time-out" conducted as per Joint Commission's Universal Protocol (UP.01.01.01). Time: 1152  Description of Procedure:          Laterality: Bilateral. The procedure was performed in identical fashion on both sides. Targeted Levels: L3, L4, L5, and S1  Medial Branch Level(s)  Safety Precautions: Aspiration looking for blood return was conducted prior to all injections. At no  point did we inject any substances, as a needle was being advanced. Before injecting, the patient was told to immediately notify me if she was experiencing any new onset of "  ringing in the ears, or metallic taste in the mouth". No attempts were made at seeking any paresthesias. Safe injection practices and needle disposal techniques used. Medications properly checked for expiration dates. SDV (single dose vial) medications used. After the completion of the procedure, all disposable equipment used was discarded in the proper designated medical waste containers. Local Anesthesia: Protocol guidelines were followed. The patient was positioned over the fluoroscopy table. The area was prepped in the usual manner. The time-out was completed. The target area was identified using fluoroscopy. A 12-in long, straight, sterile hemostat was used with fluoroscopic guidance to locate the targets for each level blocked. Once located, the skin was marked with an approved surgical skin marker. Once all sites were marked, the skin (epidermis, dermis, and hypodermis), as well as deeper tissues (fat, connective tissue and muscle) were infiltrated with a small amount of a short-acting local anesthetic, loaded on a 10cc syringe with a 25G, 1.5-in  Needle. An appropriate amount of time was allowed for local anesthetics to take effect before proceeding to the next step. Local Anesthetic: Lidocaine 2.0% The unused portion of the local anesthetic was discarded in the proper designated containers. Technical description of process:   L3 Medial Branch Nerve Block (MBB): The target area for the L3 medial branch is at the junction of the postero-lateral aspect of the superior articular process and the superior, posterior, and medial edge of the transverse process of L4. Under fluoroscopic guidance, a Quincke needle was inserted until contact was made with os over the superior postero-lateral aspect of the pedicular shadow (target area). After  negative aspiration for blood, 0.5 mL of the nerve block solution was injected without difficulty or complication. The needle was removed intact. L4 Medial Branch Nerve Block (MBB): The target area for the L4 medial branch is at the junction of the postero-lateral aspect of the superior articular process and the superior, posterior, and medial edge of the transverse process of L5. Under fluoroscopic guidance, a Quincke needle was inserted until contact was made with os over the superior postero-lateral aspect of the pedicular shadow (target area). After negative aspiration for blood, 0.5 mL of the nerve block solution was injected without difficulty or complication. The needle was removed intact. L5 Medial Branch Nerve Block (MBB): The target area for the L5 medial branch is at the junction of the postero-lateral aspect of the superior articular process and the superior, posterior, and medial edge of the sacral ala. Under fluoroscopic guidance, a Quincke needle was inserted until contact was made with os over the superior postero-lateral aspect of the pedicular shadow (target area). After negative aspiration for blood, 0.5 mL of the nerve block solution was injected without difficulty or complication. The needle was removed intact. S1 Medial Branch Nerve Block (MBB): The target area for the S1 medial branch is at the posterior and inferior 6 o'clock position of the L5-S1 facet joint. Under fluoroscopic guidance, the Quincke needle inserted for the L5 MBB was redirected until contact was made with os over the inferior and postero aspect of the sacrum, at the 6 o' clock position under the L5-S1 facet joint (Target area). After negative aspiration for blood, 0.5 mL of the nerve block solution was injected without difficulty or complication. The needle was removed intact.  Once the entire procedure was completed, the treated area was cleaned, making sure to leave some of the prepping solution back to take advantage  of its long term bactericidal properties.  Illustration of the posterior view of the lumbar spine and the posterior neural structures. Laminae of L2 through S1 are labeled. DPRL5, dorsal primary ramus of L5; DPRS1, dorsal primary ramus of S1; DPR3, dorsal primary ramus of L3; FJ, facet (zygapophyseal) joint L3-L4; I, inferior articular process of L4; LB1, lateral branch of dorsal primary ramus of L1; IAB, inferior articular branches from L3 medial branch (supplies L4-L5 facet joint); IBP, intermediate branch plexus; MB3, medial branch of dorsal primary ramus of L3; NR3, third lumbar nerve root; S, superior articular process of L5; SAB, superior articular branches from L4 (supplies L4-5 facet joint also); TP3, transverse process of L3.  Vitals:   04/10/22 1150 04/10/22 1155 04/10/22 1200 04/10/22 1206  BP: (!) 178/80 135/78  (!) 155/76  Pulse:      Resp: '18 16 18 16  '$ Temp:      TempSrc:      SpO2: 98% 97% 99% 99%  Weight:      Height:         Start Time: 1152 hrs. End Time: 1200 hrs.  Imaging Guidance (Spinal):          Type of Imaging Technique: Fluoroscopy Guidance (Spinal) Indication(s): Assistance in needle guidance and placement for procedures requiring needle placement in or near specific anatomical locations not easily accessible without such assistance. Exposure Time: Please see nurses notes. Contrast: None used. Fluoroscopic Guidance: I was personally present during the use of fluoroscopy. "Tunnel Vision Technique" used to obtain the best possible view of the target area. Parallax error corrected before commencing the procedure. "Direction-depth-direction" technique used to introduce the needle under continuous pulsed fluoroscopy. Once target was reached, antero-posterior, oblique, and lateral fluoroscopic projection used confirm needle placement in all planes. Images permanently stored in EMR. Interpretation: No contrast injected. I personally interpreted the imaging  intraoperatively. Adequate needle placement confirmed in multiple planes. Permanent images saved into the patient's record.  Antibiotic Prophylaxis:   Anti-infectives (From admission, onward)    None      Indication(s): None identified  Post-operative Assessment:  Post-procedure Vital Signs:  Pulse/HCG Rate: 6563 Temp: (!) 97.1 F (36.2 C) Resp: 16 BP: (!) 155/76 SpO2: 99 %  EBL: None  Complications: No immediate post-treatment complications observed by team, or reported by patient.  Note: The patient tolerated the entire procedure well. A repeat set of vitals were taken after the procedure and the patient was kept under observation following institutional policy, for this type of procedure. Post-procedural neurological assessment was performed, showing return to baseline, prior to discharge. The patient was provided with post-procedure discharge instructions, including a section on how to identify potential problems. Should any problems arise concerning this procedure, the patient was given instructions to immediately contact us, at any time, without hesitation. In any case, we plan to contact the patient by telephone for a follow-up status report regarding this interventional procedure.  Comments:  No additional relevant information.  Plan of Care  Orders:  Orders Placed This Encounter  Procedures   LUMBAR FACET(MEDIAL BRANCH NERVE BLOCK) MBNB    Scheduling Instructions:     Procedure: Lumbar facet block (AKA.: Lumbosacral medial branch nerve block)     Side: Bilateral     Level: L3-4 & L5-S1 Facets (L2, L3, L4, L5, & S1 Medial Branch Nerves)     Sedation: Patient's choice.     Timeframe: Today    Order Specific Question:   Where will this procedure be performed?    Answer:   [facility]   DG [facility] C-ARM 1-60 MIN NO REPORT    Intraoperative interpretation by procedural physician at [place].    Standing Status:   Standing    Number of  Occurrences:   1    Order Specific Question:   Reason for exam:    Answer:   Assistance in needle guidance and placement for procedures requiring needle placement in or near specific anatomical locations not easily accessible without such assistance.   Informed Consent Details: Physician/Practitioner Attestation; Transcribe to consent form and obtain patient signature    Nursing Order: Transcribe to consent form and obtain patient signature. Note: Always confirm laterality of pain with Ms. Lilia Pro, before procedure.    Order Specific Question:   Physician/Practitioner attestation of informed consent for procedure/surgical case    Answer:   I, the physician/practitioner, attest that I have discussed with the patient the benefits, risks, side effects, alternatives, likelihood of achieving goals and potential problems during recovery for the procedure that I have provided informed consent.    Order Specific Question:   Procedure    Answer:   Lumbar Facet Block  under fluoroscopic guidance    Order Specific Question:   Physician/Practitioner performing the procedure    Answer:   Ankita Newcomer A. Dossie Arbour MD    Order Specific Question:   Indication/Reason    Answer:   Low Back Pain, with our without leg pain, due to Facet Joint Arthralgia (Joint Pain) Spondylosis (Arthritis of the Spine), without myelopathy or radiculopathy (Nerve Damage).   Provide equipment / supplies at bedside    Procedure tray: "Block Tray" (Disposable  single use) Skin infiltration needle: Regular 1.5-in, 25-G, (x1) Block Needle type: Spinal Amount/quantity: 4 Size: Medium (5-inch) Gauge: 22G    Standing Status:   Standing    Number of Occurrences:   1    Order Specific Question:   Specify    Answer:   Block Tray   Chronic Opioid Analgesic:  No opioid analgesics from our practice.   Medications ordered for procedure: Meds ordered this encounter  Medications   lidocaine (XYLOCAINE) 2 % (with pres) injection 400 mg    pentafluoroprop-tetrafluoroeth (GEBAUERS) aerosol   lactated ringers infusion   midazolam (VERSED) injection 0.5-2 mg    Make sure Flumazenil is available in the pyxis when using this medication. If oversedation occurs, administer 0.2 mg IV over 15 sec. If after 45 sec no response, administer 0.2 mg again over 1 min; may repeat at 1 min intervals; not to exceed 4 doses (1 mg)   ropivacaine (PF) 2 mg/mL (0.2%) (NAROPIN) injection 18 mL   triamcinolone acetonide (KENALOG-40) injection 80 mg   Medications administered: We administered lidocaine, pentafluoroprop-tetrafluoroeth, lactated ringers, midazolam, ropivacaine (PF) 2 mg/mL (0.2%), and triamcinolone acetonide.  See the medical record for exact dosing, route, and time of administration.  Follow-up plan:   Return in about 2 weeks (around 04/24/2022) for Proc-day (T,Th), (F2F), (PPE).        Interventional Therapies  Risk  Complexity Considerations:   WNL   Planned  Pending:   Therapeutic/palliative bilateral lumbar facet MBB #7 (#3 of 2023)    Under consideration:   Therapeutic/palliative bilateral lumbar facet + SI joint RFA #2    Completed:   Diagnostic/therapeutic right L4-5 LESI x1 (01/22/2019) (50/50/90 x2 weeks)  Diagnostic bilateral SI Blk x2 (02/09/2020) (100/100/80) (100/100/100/50)  Therapeutic right SI RFA x1 (03/17/2020) (100/100/100/90-100)  Therapeutic left SI RFA x1 (03/31/2020) (100/100/100 x3 weeks/70)  Diagnostic bilateral lumbar  facet MBB x2 (01/22/2017; 04/18/2017) (100/100/90/90) (100/90/100/100)  Palliative bilateral lumbar facet MBB x3 (10/09/2018) (100/100/95/90-100/duration of up to 8 months)  Therapeutic right lumbar facet RFA x1 (07/21/2019) (100/100/75)  Therapeutic left lumbar facet RFA x1 (08/06/2019)  (100/100/75)  Therapeutic bilateral lumbar facet + SI joint Blk x1 (05/18/2021) (100/100/100) patient states that the combination works better than individually. Therapeutic/palliative bilateral lumbar facet  MBB x6 + SI Blk x4 (11/09/2021) (100/100/95/95) (lower extremity pain also goes away)   Completed by other providers:   None at this time   Therapeutic  Palliative (PRN) options:   Palliative lumbar facet MBB  Therapeutic lumbar facet RFA  Therapeutic SI RFA  Palliative SI joint Blk  Therapeutic L4-5 LESI       Recent Visits Date Type Provider Dept  04/02/22 Office Visit Milinda Pointer, MD [facility]  Showing recent visits within past 90 days and meeting all other requirements Today's Visits Date Type Provider Dept  04/10/22 Procedure visit Milinda Pointer, MD [facility]  Showing today's visits and meeting all other requirements Future Appointments Date Type Provider Dept  05/01/22 Appointment Milinda Pointer, MD [facility]  Showing future appointments within next 90 days and meeting all other requirements  Disposition: Discharge home  Discharge (Date  Time): 04/10/2022; 1208 hrs.   Primary Care Physician: Virginia Crews, MD Location: [facility] Note by: Gaspar Cola, MD Date: 04/10/2022; Time: 12:25 PM  Disclaimer:  Medicine is not an Chief Strategy Officer. The only guarantee in medicine is that nothing is guaranteed. It is important to note that the decision to proceed with this intervention was based on the information collected from the patient. The Data and conclusions were drawn from the patient's questionnaire, the interview, and the physical examination. Because the information was provided in large part by the patient, it cannot be guaranteed that it has not been purposely or unconsciously manipulated. Every effort has been made to obtain as much relevant data as possible for this evaluation. It is important to note that the conclusions that lead to this procedure are derived in large part from the available data. Always take into account that the treatment will also be dependent on  availability of resources and existing treatment guidelines, considered by other Pain Management Practitioners as being common knowledge and practice, at the time of the intervention. For Medico-Legal purposes, it is also important to point out that variation in procedural techniques and pharmacological choices are the acceptable norm. The indications, contraindications, technique, and results of the above procedure should only be interpreted and judged by a Board-Certified Interventional Pain Specialist with extensive familiarity and expertise in the same exact procedure and technique.

## 2022-04-10 NOTE — Patient Instructions (Addendum)

## 2022-04-11 ENCOUNTER — Telehealth: Payer: Self-pay | Admitting: *Deleted

## 2022-04-11 NOTE — Telephone Encounter (Signed)
Called for post procedure check. Denies any issues. 

## 2022-04-12 ENCOUNTER — Other Ambulatory Visit: Payer: Self-pay | Admitting: Family Medicine

## 2022-04-13 DIAGNOSIS — H903 Sensorineural hearing loss, bilateral: Secondary | ICD-10-CM | POA: Diagnosis not present

## 2022-04-13 DIAGNOSIS — H9319 Tinnitus, unspecified ear: Secondary | ICD-10-CM | POA: Diagnosis not present

## 2022-04-13 DIAGNOSIS — J3489 Other specified disorders of nose and nasal sinuses: Secondary | ICD-10-CM | POA: Diagnosis not present

## 2022-04-18 DIAGNOSIS — M542 Cervicalgia: Secondary | ICD-10-CM | POA: Diagnosis not present

## 2022-04-18 DIAGNOSIS — M9901 Segmental and somatic dysfunction of cervical region: Secondary | ICD-10-CM | POA: Diagnosis not present

## 2022-04-18 DIAGNOSIS — M5416 Radiculopathy, lumbar region: Secondary | ICD-10-CM | POA: Diagnosis not present

## 2022-04-18 DIAGNOSIS — M9903 Segmental and somatic dysfunction of lumbar region: Secondary | ICD-10-CM | POA: Diagnosis not present

## 2022-04-19 ENCOUNTER — Telehealth: Payer: Self-pay | Admitting: Family Medicine

## 2022-04-19 NOTE — Telephone Encounter (Signed)
Sleep study results received and reviewed from 04/03/22.  During her home sleep study, it was observed that there was evidence of mild obstructive sleep apnea.  This was observed with AHI of 7.2/h which means she had about 7 episodes per hour of either her oxygen saturation dropping or of apnea, which is stopping breathing.  It is recommended to treat this with a CPAP machine.  If patient is agreeable, we can order this through [employer].  Would order auto titrating CPAP with nasal pillows mask, heated humidity, and all necessary filters and tubing.  Shadai Mcclane, Dionne Bucy, MD, MPH [facility]

## 2022-04-20 ENCOUNTER — Ambulatory Visit (INDEPENDENT_AMBULATORY_CARE_PROVIDER_SITE_OTHER): Payer: Medicare Other | Admitting: Obstetrics and Gynecology

## 2022-04-20 ENCOUNTER — Encounter: Payer: Self-pay | Admitting: Obstetrics and Gynecology

## 2022-04-20 ENCOUNTER — Ambulatory Visit: Payer: Self-pay | Admitting: *Deleted

## 2022-04-20 VITALS — BP 154/84 | HR 60 | Ht 62.3 in | Wt 151.0 lb

## 2022-04-20 DIAGNOSIS — N393 Stress incontinence (female) (male): Secondary | ICD-10-CM

## 2022-04-20 DIAGNOSIS — R350 Frequency of micturition: Secondary | ICD-10-CM | POA: Diagnosis not present

## 2022-04-20 DIAGNOSIS — N3941 Urge incontinence: Secondary | ICD-10-CM | POA: Diagnosis not present

## 2022-04-20 DIAGNOSIS — Z01419 Encounter for gynecological examination (general) (routine) without abnormal findings: Secondary | ICD-10-CM

## 2022-04-20 LAB — POCT URINALYSIS DIPSTICK
Bilirubin, UA: NEGATIVE
Glucose, UA: NEGATIVE
Ketones, UA: NEGATIVE
Nitrite, UA: NEGATIVE
Protein, UA: NEGATIVE
Spec Grav, UA: >=1.03 — AB (ref 1.010–1.025)
Urobilinogen, UA: 0.2 E.U./dL
pH, UA: 6 (ref 5.0–8.0)

## 2022-04-20 NOTE — Patient Instructions (Signed)

## 2022-04-20 NOTE — Telephone Encounter (Signed)
Called, left vm to call back for sresults.

## 2022-04-20 NOTE — Telephone Encounter (Signed)
Please other message. Patient would like to wait and see after she sees ENT.

## 2022-04-20 NOTE — Telephone Encounter (Signed)
Reason for Disposition  [1] Follow-up call to recent contact AND [2] information only call, no triage required  Answer Assessment - Initial Assessment Questions 1. REASON FOR CALL or QUESTION: "What is your reason for calling today?" or "How can I best help you?" or "What question do you have that I can help answer?"     Pt returned call and was given the sleep study results from Dr. Brita Romp dated 04/19/2022 at 12:09 PM.  She wants to think about the CPAP machine and check with her insurance company to see how much is covered.    She is going to an ENT and is going to have a procedure done to open her nasal passages.   She's wanting to see if that helps before making a decision on the CPAP machine.    She will call us back.  Protocols used: Information Only Call - No Triage-A-AH

## 2022-04-20 NOTE — Telephone Encounter (Signed)
Lmtcb to go over sleep study results. PEC please advise.

## 2022-04-20 NOTE — Progress Notes (Signed)
[facility] New Patient Evaluation and Consultation  Referring Provider: Virginia Crews, MD PCP: Virginia Crews, MD Date of Service: 04/20/2022  SUBJECTIVE Chief Complaint: New Patient (Initial Visit) Ashley Rodriguez is a 71 y.o. female here for a consult for leakage. Pt said she has urinary urgency. )  History of Present Illness: Ashley Rodriguez is a 71 y.o. White or Caucasian female seen in consultation at the request of Dr. Brita Romp for evaluation of incontinence.    Review of records significant for: Worsening stress incontinence, cystocele noted.   Last A1c on 03/07/22- 6.3%  Urinary Symptoms: Leaks urine with cough/ sneeze, laughing, exercise, lifting, going from sitting to standing, with movement to the bathroom, and with urgency. Notices a lot of the leakage with golf. Has urgency often.  Leaks a few time(s) per day- depends on activity.  Pad use:  liners/ mini-pads  She is bothered by her UI symptoms.  Day time voids- every few hours.  Nocturia: 0-1 times per night to void. Voiding dysfunction: she empties her bladder well.  does not use a catheter to empty bladder.  When urinating, she feels she has no difficulties Drinks: 2 cups decaf coffee, a couple bottles of water, half/ half tea per day  UTIs:  0  UTI's in the last year.   Denies history of blood in urine and kidney or bladder stones  Pelvic Organ Prolapse Symptoms:                  She Denies a feeling of a bulge the vaginal area. Cystocele noted on exam.   Bowel Symptom: Bowel movements: 1 time(s) per day Stool consistency: soft  Straining: no.  Splinting: no.  Incomplete evacuation: no.  She Denies accidental bowel leakage / fecal incontinence Bowel regimen: none and diet  Sexual Function Sexually active: yes.  Pain with sex: Yes, at the vaginal opening She is using lubricant- coconut oil  Pelvic Pain Denies pelvic pain    Past Medical History:  Past Medical History:   Diagnosis Date   COVID-19 12/15/2018   Flank lipoma 08/17/2016   Hypertension    Neck mass 04/07/2013   Psoriatic arthritis ([place])    Recurrent cellulitis    T2DM (type 2 diabetes mellitus) ([place]) 10/01/2018     Past Surgical History:   Past Surgical History:  Procedure Laterality Date   COLONOSCOPY  04/30/2013   lipoma removed  04/30/2012   on neck   NECK MASS EXCISION  04/28/2014   lipoma   SUPRACERVICAL ABDOMINAL HYSTERECTOMY  04/30/1998   with BSO     Past OB/GYN History: OB History  Gravida Para Term Preterm AB Living  '1 1 1     1  '$ SAB IAB Ectopic Multiple Live Births          1    # Outcome Date GA Lbr Len/2nd Weight Sex Delivery Anes PTL Lv  1 Term      Vag-Forceps       Obstetric Comments  1st Menstrual Cycle:  13  1st Pregnancy:  27   S/p hysterectomy and BSO for fibroids and endometriosis.  Has not been to a gynecologist since her supracervical hysterectomy- pap smears are not up to date.  Denies vaginal bleeding   Medications: She has a current medication list which includes the following prescription(s): acetaminophen, amlodipine, atenolol, betamethasone dipropionate, calcium carbonate, co-enzyme G-92, enbrel sureclick, esomeprazole, ezetimibe, fexofenadine, gabapentin, losartan, montelukast, multivitamin with minerals, turmeric, and valacyclovir, and the following Facility-Administered Medications:  triamcinolone acetonide.   Allergies: Patient is allergic to atorvastatin, sulfamethoxazole-trimethoprim, codeine, and sulfa antibiotics.   Social History:  Social History   Tobacco Use   Smoking status: Never   Smokeless tobacco: Never  Vaping Use   Vaping Use: Never used  Substance Use Topics   Alcohol use: Yes    Alcohol/week: 0.0 - 4.0 standard drinks of alcohol    Comment: no more han 2 drinks at a time   Drug use: No    Relationship status: long-term partner She lives with significant other.   She is not employed. Regular exercise: Yes:  golf History of abuse: No  Family History:   Family History  Problem Relation Age of Onset   Hypertension Mother    Atrial fibrillation Mother    Cancer Father        bladder   Atrial fibrillation Brother    Supraventricular tachycardia Brother    Arrhythmia Nephew    Breast cancer Neg Hx    Colon cancer Neg Hx      Review of Systems: Review of Systems  Constitutional:  Negative for fever, malaise/fatigue and weight loss.  Respiratory:  Positive for shortness of breath. Negative for cough and wheezing.   Cardiovascular:  Negative for chest pain, palpitations and leg swelling.  Gastrointestinal:  Negative for abdominal pain and blood in stool.  Genitourinary:  Negative for dysuria.  Musculoskeletal:  Negative for myalgias.  Skin:  Negative for rash.  Neurological:  Negative for dizziness and headaches.  Endo/Heme/Allergies:  Bruises/bleeds easily.  Psychiatric/Behavioral:  Negative for depression. The patient is not nervous/anxious.      OBJECTIVE Physical Exam: Vitals:   04/20/22 1444  BP: (!) 154/84  Pulse: 60  Weight: 151 lb (68.5 kg)  Height: 5' 2.3" (1.582 m)    Physical Exam Constitutional:      General: She is not in acute distress. Pulmonary:     Effort: Pulmonary effort is normal.  Abdominal:     General: There is no distension.     Palpations: Abdomen is soft.     Tenderness: There is no abdominal tenderness. There is no rebound.  Musculoskeletal:        General: No swelling. Normal range of motion.  Skin:    General: Skin is warm and dry.     Findings: No rash.  Neurological:     Mental Status: She is alert and oriented to person, place, and time.  Psychiatric:        Mood and Affect: Mood normal.        Behavior: Behavior normal.      GU / Detailed Urogynecologic Evaluation:  Pelvic Exam: Normal external female genitalia; Bartholin's and Skene's glands normal in appearance; urethral meatus normal in appearance, no urethral masses or  discharge.   CST: negative  Speculum exam reveals normal vaginal mucosa with atrophy. Cervix normal appearance. Uterus absent. Adnexa  not palpable .    Pelvic floor strength I/V  Pelvic floor musculature: Right levator non-tender, Right obturator non-tender, Left levator non-tender, Left obturator non-tender  POP-Q:   POP-Q  -2                                            Aa   -2  Ba  -7                                              C   2                                            Gh  3.5                                            Pb  7                                            tvl   -2                                            Ap  -2                                            Bp  -7                                              D      Rectal Exam:  Normal external rectum  Post-Void Residual (PVR) by Bladder Scan: In order to evaluate bladder emptying, we discussed obtaining a postvoid residual and she agreed to this procedure.  Procedure: The ultrasound unit was placed on the patient's abdomen in the suprapubic region after the patient had voided. A PVR of 28 ml was obtained by bladder scan.  Laboratory Results: POC urine: trace leukocytes and blood   ASSESSMENT AND PLAN Ms. Kepple is a 71 y.o. with:  1. SUI (stress urinary incontinence, female)   2. Urge incontinence   3. Urinary frequency   4. Well woman exam    SUI - For treatment of stress urinary incontinence,  non-surgical options include expectant management, weight loss, physical therapy, as well as a pessary.  Surgical options include a midurethral sling, Burch urethropexy, and transurethral injection of a bulking agent. - She is deciding between urethral bulking vs sling. Handouts provided. Needs simple CMG testing to demonstrate leakage.  - minimal prolapse noted on exam today  2. Urge incontinence - We discussed the symptoms of overactive bladder  (OAB), which include urinary urgency, urinary frequency, nocturia, with or without urge incontinence.  While we do not know the exact etiology of OAB, several treatment options exist. We discussed management including behavioral therapy (decreasing bladder irritants, urge suppression strategies, timed voids, bladder retraining), physical therapy, medication.  - She will work on decreasing bladder irritants, tea and coffee.   - Although she has had a hysterectomy, she still has a cervix, and has not had a pap smear  in over 20 years. Referred to [facility] for well woman exam/ pap smear.   Return for simple CMG  Jaquita Folds, MD

## 2022-04-25 ENCOUNTER — Other Ambulatory Visit: Payer: Self-pay | Admitting: Family Medicine

## 2022-04-25 DIAGNOSIS — E1169 Type 2 diabetes mellitus with other specified complication: Secondary | ICD-10-CM

## 2022-04-30 NOTE — Progress Notes (Unsigned)
PROVIDER NOTE: Information contained herein reflects review and annotations entered in association with encounter. Interpretation of such information and data should be left to medically-trained personnel. Information provided to patient can be located elsewhere in the medical record under "Patient Instructions". Document created using STT-dictation technology, any transcriptional errors that may result from process are unintentional.    Patient: Ashley Rodriguez  Service Category: E/M  Provider: Gaspar Cola, MD  DOB: 1950/08/21  DOS: 05/01/2022  Referring Provider: Virginia Crews, MD  MRN: [id number]  Specialty: Interventional Pain Management  PCP: Virginia Crews, MD  Type: Established Patient  Setting: Ambulatory outpatient    Location: Office  Delivery: Face-to-face     HPI  Ashley Rodriguez, a 72 y.o. year old female, is here today because of her Facet syndrome, lumbar [M47.816]. [name] primary complain today is No chief complaint on file. Last encounter: My last encounter with her was on 04/10/2022. Pertinent problems: [name] has Psoriatic arthritis ([place]); Chronic low back pain (1ry area of Pain) (Bilateral) (R>L) w/ sciatica (Bilateral); Chronic lower extremity pain (2ry area of Pain) (Bilateral) (R>L); Grade 1 Anterolisthesis of L5 over S1; L5 pars defect with spondylolisthesis (Bilateral); DDD (degenerative disc disease), lumbar; Lumbar facet syndrome (Bilateral) (R>L); Chronic pain syndrome; Chronic musculoskeletal pain; Neurogenic pain; Chronic sacroiliac joint pain (Bilateral) (R>L); Osteoarthritis of lumbar spine; Spondylosis without myelopathy or radiculopathy, lumbosacral region; Peripheral neuropathy; Generalized osteoarthritis of hand; Numbness and tingling of foot; Low back pain radiating to both legs; Abnormal EMG (chronic right lower lumbar polyradiculopathy); Chronic lower lumbar polyradiculopathy (Right); DDD (degenerative disc disease), lumbosacral; Lumbar  spondylosis w/ polyradiculopathy (Right); Lumbosacral radiculopathy (Right); Other intervertebral disc degeneration, lumbar region; Spondylolisthesis of lumbosacral region; Other spondylosis, sacral and sacrococcygeal region; Somatic dysfunction of sacroiliac joints (Bilateral); Osteoarthritis of sacroiliac joints (Bilateral); Myalgia due to statin; Chronic sacroiliac joint pain (Right); Osteoarthritis of sacroiliac joint ([place]) (Right); Enthesopathy of sacroiliac joint; Chronic low back pain (Bilateral) w/o sciatica; Chronic sacroiliac joint pain (Left); Osteoarthritis of sacroiliac joint (Left) ([place]); Somatic dysfunction of sacroiliac joint (Left); and Sciatica, left side on their pertinent problem list. Pain Assessment: Severity of   is reported as a  /10. Location:    / . Onset:  . Quality:  . Timing:  . Modifying factor(s):  Marland Kitchen Vitals:  vitals were not taken for this visit.  BMI: Estimated body mass index is 27.35 kg/m as calculated from the following:   Height as of 04/20/22: 5' 2.3" (1.582 m).   Weight as of 04/20/22: 151 lb (68.5 kg).  Reason for encounter: post-procedure evaluation and assessment. ***  Post-procedure evaluation   Type: Lumbar Facet, Medial Branch Block(s) #3  Laterality: Bilateral  Level: L3, L4, L5, and S1 Medial Branch Level(s). Injecting these levels blocks the L4-5 and L5-S1 lumbar facet joints. Imaging: Fluoroscopic guidance         Anesthesia: Local anesthesia (1-2% Lidocaine) Anxiolysis: IV Versed 1.5 mg Sedation: No Sedation                       DOS: 04/10/2022 Performed by: Gaspar Cola, MD  Primary Purpose: Diagnostic/Therapeutic Indications: Low back pain severe enough to impact quality of life or function. 1. Lumbar facet syndrome (Bilateral) (R>L)   2. Spondylosis without myelopathy or radiculopathy, lumbosacral region   3. L5 pars defect with spondylolisthesis (Bilateral)   4. Grade 1 Anterolisthesis of L5 over S1   5. DDD (degenerative disc  disease), lumbosacral   6.  Chronic low back pain (Bilateral) w/o sciatica    NAS-11 Pain score:   Pre-procedure: 4 /10   Post-procedure: 2 /10      Effectiveness:  Initial hour after procedure:   ***. Subsequent 4-6 hours post-procedure:   ***. Analgesia past initial 6 hours:   ***. Ongoing improvement:  Analgesic:  *** Function:    ***    ROM:    ***     Pharmacotherapy Assessment  Analgesic: No opioid analgesics from our practice.   Monitoring: Okay PMP: PDMP not reviewed this encounter.       Pharmacotherapy: No side-effects or adverse reactions reported. Compliance: No problems identified. Effectiveness: Clinically acceptable.  No notes on file  No results found for: "CBDTHCR" No results found for: "D8THCCBX" No results found for: "D9THCCBX"  UDS:  No results found for: "SUMMARY"    ROS  Constitutional: Denies any fever or chills Gastrointestinal: No reported hemesis, hematochezia, vomiting, or acute GI distress Musculoskeletal: Denies any acute onset joint swelling, redness, loss of ROM, or weakness Neurological: No reported episodes of acute onset apraxia, aphasia, dysarthria, agnosia, amnesia, paralysis, loss of coordination, or loss of consciousness  Medication Review  Turmeric, acetaminophen, amLODipine, atenolol, betamethasone dipropionate, calcium carbonate, co-enzyme Q-10, esomeprazole, etanercept, ezetimibe, fexofenadine, gabapentin, losartan, montelukast, multivitamin with minerals, and valACYclovir  History Review  Allergy: Ashley Rodriguez is allergic to atorvastatin, sulfamethoxazole-trimethoprim, codeine, and sulfa antibiotics. Drug: Ashley Rodriguez  reports no history of drug use. Alcohol:  reports current alcohol use. Tobacco:  reports that she has never smoked. She has never used smokeless tobacco. Social: Ashley Rodriguez  reports that she has never smoked. She has never used smokeless tobacco. She reports current alcohol use. She reports that she does not use  drugs. Medical:  has a past medical history of COVID-19 (12/15/2018), Flank lipoma (08/17/2016), Hypertension, Neck mass (04/07/2013), Psoriatic arthritis (Ashley Rodriguez), Recurrent cellulitis, and T2DM (type 2 diabetes mellitus) ([place]) (10/01/2018). Surgical: Ms. Defelice  has a past surgical history that includes Supracervical abdominal hysterectomy (04/30/1998); lipoma removed (04/30/2012); Colonoscopy (04/30/2013); and Neck mass excision (04/28/2014). Family: family history includes Arrhythmia in her nephew; Atrial fibrillation in her brother and mother; Cancer in her father; Hypertension in her mother; Supraventricular tachycardia in her brother.  Laboratory Chemistry Profile   Renal Lab Results  Component Value Date   BUN 11 03/07/2022   CREATININE 0.66 03/07/2022   BCR 17 03/07/2022   GFRAA 94 05/17/2020   GFRNONAA 81 05/17/2020    Hepatic Lab Results  Component Value Date   AST 36 03/07/2022   ALT 39 (H) 03/07/2022   ALBUMIN 4.9 03/07/2022   ALKPHOS 67 03/07/2022    Electrolytes Lab Results  Component Value Date   NA 140 03/07/2022   K 4.6 03/07/2022   CL 99 03/07/2022   CALCIUM 9.6 03/07/2022   MG 2.1 01/03/2017    Bone Lab Results  Component Value Date   VD25OH 47.6 03/07/2022   25OHVITD1 50 01/03/2017   25OHVITD2 <1.0 01/03/2017   25OHVITD3 50 01/03/2017    Inflammation (CRP: Acute Phase) (ESR: Chronic Phase) Lab Results  Component Value Date   CRP 0.8 01/03/2017   ESRSEDRATE 2 01/25/2020         Note: Above Lab results reviewed.  Recent Imaging Review  DG PAIN CLINIC C-ARM 1-60 MIN NO REPORT Fluoro was used, but no Radiologist interpretation will be provided.  Please refer to "NOTES" tab for provider progress note. Note: Reviewed        Physical Exam  General appearance: Well nourished, well developed, and well hydrated. In no apparent acute distress Mental status: Alert, oriented x 3 (person, place, & time)       Respiratory: No evidence of acute respiratory  distress Eyes: PERLA Vitals: There were no vitals taken for this visit. BMI: Estimated body mass index is 27.35 kg/m as calculated from the following:   Height as of 04/20/22: 5' 2.3" (1.582 m).   Weight as of 04/20/22: 151 lb (68.5 kg). Ideal: Ideal body weight: 50.8 kg (111 lb 15.5 oz) Adjusted ideal body weight: 57.9 kg (127 lb 9.3 oz)  Assessment   Diagnosis Status  1. Lumbar facet syndrome (Bilateral) (R>L)   2. Chronic low back pain (Bilateral) w/o sciatica   3. Spondylosis without myelopathy or radiculopathy, lumbosacral region    Controlled Controlled Controlled   Updated Problems: No problems updated.  Plan of Care  Problem-specific:  No problem-specific Assessment & Plan notes found for this encounter.  Ms. Jessyka Austria has a current medication list which includes the following long-term medication(s): amlodipine, atenolol, calcium carbonate, enbrel sureclick, esomeprazole, ezetimibe, fexofenadine, gabapentin, losartan, and montelukast.  Pharmacotherapy (Medications Ordered): No orders of the defined types were placed in this encounter.  Orders:  No orders of the defined types were placed in this encounter.  Follow-up plan:   No follow-ups on file.     Interventional Therapies  Risk Factors  Considerations:  BCBSNC DENIED L-MRI (02/17/19)    Planned  Pending:   Therapeutic/palliative bilateral lumbar facet MBB #7 (#3 of 2023)    Under consideration:   Therapeutic/palliative bilateral lumbar facet + SI joint RFA #2    Completed:   Diagnostic/therapeutic right L4-5 LESI x1 (01/22/2019) (50/50/90 x2 weeks)  Diagnostic bilateral SI Blk x2 (02/09/2020) (100/100/80) (100/100/100/50)  Therapeutic right SI RFA x1 (03/17/2020) (100/100/100/90-100)  Therapeutic left SI RFA x1 (03/31/2020) (100/100/100 x3 weeks/70)  Diagnostic bilateral lumbar facet MBB x2 (01/22/2017; 04/18/2017) (100/100/90/90) (100/90/100/100)  Palliative bilateral lumbar facet MBB x3  (10/09/2018) (100/100/95/90-100/duration of up to 8 months)  Therapeutic right lumbar facet RFA x1 (07/21/2019) (100/100/75)  Therapeutic left lumbar facet RFA x1 (08/06/2019)  (100/100/75)  Therapeutic bilateral lumbar facet + SI joint Blk x1 (05/18/2021) (100/100/100) patient states that the combination works better than individually. Therapeutic/palliative bilateral lumbar facet MBB x6 + SI Blk x4 (11/09/2021) (100/100/95/95) (lower extremity pain also goes away)   Completed by other providers:      Therapeutic  Palliative (PRN) options:   Palliative lumbar facet MBB  Therapeutic lumbar facet RFA  Therapeutic SI RFA  Palliative SI joint Blk  Therapeutic L4-5 LESI      Recent Visits Date Type Provider Dept  04/10/22 Procedure visit Milinda Pointer, MD [facility]  04/02/22 Office Visit Milinda Pointer, MD [facility]  Showing recent visits within past 90 days and meeting all other requirements Future Appointments Date Type Provider Dept  05/01/22 Appointment Milinda Pointer, MD [facility]  Showing future appointments within next 90 days and meeting all other requirements  I discussed the assessment and treatment plan with the patient. The patient was provided an opportunity to ask questions and all were answered. The patient agreed with the plan and demonstrated an understanding of the instructions.  Patient advised to call back or seek an in-person evaluation if the symptoms or condition worsens.  Duration of encounter: *** minutes.  Total time on encounter, as per AMA guidelines included both the face-to-face and non-face-to-face time personally spent by the physician and/or  other qualified health care professional(s) on the day of the encounter (includes time in activities that require the physician or other qualified health care professional and does not include time in activities normally performed by clinical staff). Physician's time may  include the following activities when performed: Preparing to see the patient (e.g., pre-charting review of records, searching for previously ordered imaging, lab work, and nerve conduction tests) Review of prior analgesic pharmacotherapies. Reviewing PMP Interpreting ordered tests (e.g., lab work, imaging, nerve conduction tests) Performing post-procedure evaluations, including interpretation of diagnostic procedures Obtaining and/or reviewing separately obtained history Performing a medically appropriate examination and/or evaluation Counseling and educating the patient/family/caregiver Ordering medications, tests, or procedures Referring and communicating with other health care professionals (when not separately reported) Documenting clinical information in the electronic or other health record Independently interpreting results (not separately reported) and communicating results to the patient/ family/caregiver Care coordination (not separately reported)  Note by: Gaspar Cola, MD Date: 05/01/2022; Time: 11:48 AM

## 2022-05-01 ENCOUNTER — Ambulatory Visit: Payer: Medicare Other | Attending: Pain Medicine | Admitting: Pain Medicine

## 2022-05-01 ENCOUNTER — Encounter: Payer: Self-pay | Admitting: Pain Medicine

## 2022-05-01 VITALS — BP 140/84 | HR 66 | Temp 97.2°F | Ht 62.0 in | Wt 149.0 lb

## 2022-05-01 DIAGNOSIS — M545 Low back pain, unspecified: Secondary | ICD-10-CM | POA: Diagnosis not present

## 2022-05-01 DIAGNOSIS — G8929 Other chronic pain: Secondary | ICD-10-CM | POA: Diagnosis not present

## 2022-05-01 DIAGNOSIS — M47817 Spondylosis without myelopathy or radiculopathy, lumbosacral region: Secondary | ICD-10-CM | POA: Diagnosis not present

## 2022-05-01 DIAGNOSIS — M47816 Spondylosis without myelopathy or radiculopathy, lumbar region: Secondary | ICD-10-CM | POA: Diagnosis not present

## 2022-05-01 NOTE — Progress Notes (Signed)
Safety precautions to be maintained throughout the outpatient stay will include: orient to surroundings, keep bed in low position, maintain call bell within reach at all times, provide assistance with transfer out of bed and ambulation.  

## 2022-05-01 NOTE — Patient Instructions (Signed)
______________________________________________________________________  Preparing for your procedure  During your procedure appointment there will be: No Prescription Refills. No disability issues to discussed. No medication changes or discussions.  Instructions: Food intake: Avoid eating anything solid for at least 8 hours prior to your procedure. Clear liquid intake: You may take clear liquids such as water up to 2 hours prior to your procedure. (No carbonated drinks. No soda.) Transportation: Unless otherwise stated by your physician, bring a driver. Morning Medicines: Except for blood thinners, take all of your other morning medications with a sip of water. Make sure to take your heart and blood pressure medicines. If your blood pressure's lower number is above 100, the case will be rescheduled. Blood thinners: If you take a blood thinner, but were not instructed to stop it, call our office [phone] and ask to talk to a nurse. Not stopping a blood thinner prior to certain procedures could lead to serious complications. Diabetics on insulin: Notify the staff so that you can be scheduled 1st case in the morning. If your diabetes requires high dose insulin, take only  of your normal insulin dose the morning of the procedure and notify the staff that you have done so. Preventing infections: Shower with an antibacterial soap the morning of your procedure.  Build-up your immune system: Take 1000 mg of Vitamin C with every meal (3 times a day) the day prior to your procedure. Antibiotics: Inform the nursing staff if you are taking any antibiotics or if you have any conditions that may require antibiotics prior to procedures. (Example: recent joint implants)   Pregnancy: If you are pregnant make sure to notify the nursing staff. Not doing so may result in injury to the fetus, including death.  Sickness: If you have a cold, fever, or any active infections, call and cancel or reschedule your  procedure. Receiving steroids while having an infection may result in complications. Arrival: You must be in the facility at least 30 minutes prior to your scheduled procedure. Tardiness: Your scheduled time is also the cutoff time. If you do not arrive at least 15 minutes prior to your procedure, you will be rescheduled.  Children: Do not bring any children with you. Make arrangements to keep them home. Dress appropriately: There is always a possibility that your clothing may get soiled. Avoid long dresses. Valuables: Do not bring any jewelry or valuables.  Reasons to call and reschedule or cancel your procedure: (Following these recommendations will minimize the risk of a serious complication.) Surgeries: Avoid having procedures within 2 weeks of any surgery. (Avoid for 2 weeks before or after any surgery). Flu Shots: Avoid having procedures within 2 weeks of a flu shots or . (Avoid for 2 weeks before or after immunizations). Barium: Avoid having a procedure within 7-10 days after having had a radiological study involving the use of radiological contrast. (Myelograms, Barium swallow or enema study). Heart attacks: Avoid any elective procedures or surgeries for the initial 6 months after a "Myocardial Infarction" (Heart Attack). Blood thinners: It is imperative that you stop these medications before procedures. Let us know if you if you take any blood thinner.  Infection: Avoid procedures during or within two weeks of an infection (including chest colds or gastrointestinal problems). Symptoms associated with infections include: Localized redness, fever, chills, night sweats or profuse sweating, burning sensation when voiding, cough, congestion, stuffiness, runny nose, sore throat, diarrhea, nausea, vomiting, cold or Flu symptoms, recent or current infections. It is specially important if the infection is   over the area that we intend to treat. Heart and lung problems: Symptoms that may suggest an  active cardiopulmonary problem include: cough, chest pain, breathing difficulties or shortness of breath, dizziness, ankle swelling, uncontrolled high or unusually low blood pressure, and/or palpitations. If you are experiencing any of these symptoms, cancel your procedure and contact your primary care physician for an evaluation.  Remember:  Regular Business hours are:  Monday to Thursday 8:00 AM to 4:00 PM  Provider's Schedule: Tequisha Maahs, MD:  Procedure days: Tuesday and Thursday 7:30 AM to 4:00 PM  Bilal Lateef, MD:  Procedure days: Monday and Wednesday 7:30 AM to 4:00 PM  ______________________________________________________________________    ____________________________________________________________________________________________  General Risks and Possible Complications  Patient Responsibilities: It is important that you read this as it is part of your informed consent. It is our duty to inform you of the risks and possible complications associated with treatments offered to you. It is your responsibility as a patient to read this and to ask questions about anything that is not clear or that you believe was not covered in this document.  Patient's Rights: You have the right to refuse treatment. You also have the right to change your mind, even after initially having agreed to have the treatment done. However, under this last option, if you wait until the last second to change your mind, you may be charged for the materials used up to that point.  Introduction: Medicine is not an exact science. Everything in Medicine, including the lack of treatment(s), carries the potential for danger, harm, or loss (which is by definition: Risk). In Medicine, a complication is a secondary problem, condition, or disease that can aggravate an already existing one. All treatments carry the risk of possible complications. The fact that a side effects or complications occurs, does not imply  that the treatment was conducted incorrectly. It must be clearly understood that these can happen even when everything is done following the highest safety standards.  No treatment: You can choose not to proceed with the proposed treatment alternative. The "PRO(s)" would include: avoiding the risk of complications associated with the therapy. The "CON(s)" would include: not getting any of the treatment benefits. These benefits fall under one of three categories: diagnostic; therapeutic; and/or palliative. Diagnostic benefits include: getting information which can ultimately lead to improvement of the disease or symptom(s). Therapeutic benefits are those associated with the successful treatment of the disease. Finally, palliative benefits are those related to the decrease of the primary symptoms, without necessarily curing the condition (example: decreasing the pain from a flare-up of a chronic condition, such as incurable terminal cancer).  General Risks and Complications: These are associated to most interventional treatments. They can occur alone, or in combination. They fall under one of the following six (6) categories: no benefit or worsening of symptoms; bleeding; infection; nerve damage; allergic reactions; and/or death. No benefits or worsening of symptoms: In Medicine there are no guarantees, only probabilities. No healthcare provider can ever guarantee that a medical treatment will work, they can only state the probability that it may. Furthermore, there is always the possibility that the condition may worsen, either directly, or indirectly, as a consequence of the treatment. Bleeding: This is more common if the patient is taking a blood thinner, either prescription or over the counter (example: Goody Powders, Fish oil, Aspirin, Garlic, etc.), or if suffering a condition associated with impaired coagulation (example: Hemophilia, cirrhosis of the liver, low platelet counts, etc.). However, even if   you  do not have one on these, it can still happen. If you have any of these conditions, or take one of these drugs, make sure to notify your treating physician. Infection: This is more common in patients with a compromised immune system, either due to disease (example: diabetes, cancer, human immunodeficiency virus [HIV], etc.), or due to medications or treatments (example: therapies used to treat cancer and rheumatological diseases). However, even if you do not have one on these, it can still happen. If you have any of these conditions, or take one of these drugs, make sure to notify your treating physician. Nerve Damage: This is more common when the treatment is an invasive one, but it can also happen with the use of medications, such as those used in the treatment of cancer. The damage can occur to small secondary nerves, or to large primary ones, such as those in the spinal cord and brain. This damage may be temporary or permanent and it may lead to impairments that can range from temporary numbness to permanent paralysis and/or brain death. Allergic Reactions: Any time a substance or material comes in contact with our body, there is the possibility of an allergic reaction. These can range from a mild skin rash (contact dermatitis) to a severe systemic reaction (anaphylactic reaction), which can result in death. Death: In general, any medical intervention can result in death, most of the time due to an unforeseen complication. ____________________________________________________________________________________________    

## 2022-05-02 ENCOUNTER — Encounter: Payer: Self-pay | Admitting: Family Medicine

## 2022-05-03 DIAGNOSIS — C44729 Squamous cell carcinoma of skin of left lower limb, including hip: Secondary | ICD-10-CM | POA: Diagnosis not present

## 2022-05-03 DIAGNOSIS — D485 Neoplasm of uncertain behavior of skin: Secondary | ICD-10-CM | POA: Diagnosis not present

## 2022-05-04 ENCOUNTER — Encounter: Payer: Self-pay | Admitting: Family Medicine

## 2022-05-04 MED ORDER — OZEMPIC (0.25 OR 0.5 MG/DOSE) 2 MG/3ML ~~LOC~~ SOPN: 0.2500 mg | PEN_INJECTOR | SUBCUTANEOUS | 3 refills | Status: DC

## 2022-05-08 NOTE — Progress Notes (Unsigned)
Verbal consent was obtained to perform simple CMG procedure:   Prolapse was reduced using 2 large cotton swabs. Urethra was prepped with betadine and a 25F catheter was placed and bladder was drained completely. The bladder was then backfilled with sterile water by gravity.  First sensation: 80 First Desire: 140 Strong Desire: 230 Capacity: 400 Cough stress test was negative while sitting. Valsalva stress test was negative while sitting.    Cough stress test was positive while standing. Valsalva stress test was positive while standing.      Interpretation: CMG showed increased sensation, and within normal limits cystometric capacity. Findings positive for stress incontinence, positive for detrusor overactivity.     Patient is interested in a sling. She plays golf and feels that having a more permanent fix for her leaking would be beneficial. She is planning to start Ozempic which we discussed may need to be stopped prior to surgery.

## 2022-05-10 ENCOUNTER — Encounter: Payer: Self-pay | Admitting: Obstetrics and Gynecology

## 2022-05-10 ENCOUNTER — Ambulatory Visit (INDEPENDENT_AMBULATORY_CARE_PROVIDER_SITE_OTHER): Payer: Medicare Other | Admitting: Obstetrics and Gynecology

## 2022-05-10 VITALS — BP 139/84 | HR 57

## 2022-05-10 DIAGNOSIS — R350 Frequency of micturition: Secondary | ICD-10-CM | POA: Diagnosis not present

## 2022-05-10 DIAGNOSIS — N393 Stress incontinence (female) (male): Secondary | ICD-10-CM | POA: Diagnosis not present

## 2022-05-10 DIAGNOSIS — N3941 Urge incontinence: Secondary | ICD-10-CM

## 2022-05-10 NOTE — Patient Instructions (Signed)
Taking Care of Yourself after CMG Drink plenty of water for a day or two following your procedure. Try to have about 8 ounces (one cup) at a time, and do this 6 times or more per day unless you have fluid restrictitons AVOID irritative beverages such as coffee, tea, soda, alcoholic or citrus drinks for a day or two, as this may cause burning with urination. You may experience some discomfort or a burning sensation with urination after having this procedure. You can use over the counter Azo or pyridium to help with burning and follow the instructions on the packaging. If it does not improve within 1-2 days, or other symptoms appear (fever, chills, or difficulty urinating) call the office to speak to a nurse.  You may return to normal daily activities such as work, school, driving, exercising and housework on the day of the procedure. If your doctor gave you a prescription, take it as ordered.

## 2022-05-11 ENCOUNTER — Telehealth: Payer: Self-pay | Admitting: Family Medicine

## 2022-05-11 ENCOUNTER — Encounter: Payer: Self-pay | Admitting: Obstetrics and Gynecology

## 2022-05-11 ENCOUNTER — Other Ambulatory Visit: Payer: Self-pay | Admitting: Physician Assistant

## 2022-05-11 ENCOUNTER — Ambulatory Visit (INDEPENDENT_AMBULATORY_CARE_PROVIDER_SITE_OTHER): Payer: Medicare Other | Admitting: Obstetrics and Gynecology

## 2022-05-11 VITALS — BP 169/85 | HR 66

## 2022-05-11 DIAGNOSIS — N3281 Overactive bladder: Secondary | ICD-10-CM

## 2022-05-11 DIAGNOSIS — N393 Stress incontinence (female) (male): Secondary | ICD-10-CM

## 2022-05-11 DIAGNOSIS — E1169 Type 2 diabetes mellitus with other specified complication: Secondary | ICD-10-CM

## 2022-05-11 MED ORDER — NOVOFINE PLUS PEN NEEDLE 32G X 4 MM MISC: 1 refills | Status: DC

## 2022-05-11 NOTE — Telephone Encounter (Signed)
sent 

## 2022-05-11 NOTE — Progress Notes (Signed)
[facility] Return Visit  SUBJECTIVE  History of Present Illness: Ashley Rodriguez is a 72 y.o. female seen in follow-up for mixed incontinence.   CMG showed increased sensation, and normal cystometric capacity. Findings positive for stress incontinence, positive for detrusor overactivity.   She is interested in proceeding with a sling.   Past Medical History: Patient  has a past medical history of COVID-19 (12/15/2018), Flank lipoma (08/17/2016), Hypertension, Neck mass (04/07/2013), Psoriatic arthritis ([place]), Recurrent cellulitis, and T2DM (type 2 diabetes mellitus) ([place]) (10/01/2018).   Past Surgical History: She  has a past surgical history that includes Supracervical abdominal hysterectomy (04/30/1998); lipoma removed (04/30/2012); Colonoscopy (04/30/2013); and Neck mass excision (04/28/2014).   Medications: She has a current medication list which includes the following prescription(s): acetaminophen, amlodipine, atenolol, betamethasone dipropionate, calcium carbonate, co-enzyme T-41, enbrel sureclick, esomeprazole, ezetimibe, fexofenadine, gabapentin, losartan, montelukast, multivitamin with minerals, ozempic (0.25 or 0.5 mg/dose), turmeric, and valacyclovir, and the following Facility-Administered Medications: triamcinolone acetonide.   Allergies: Patient is allergic to atorvastatin, sulfamethoxazole-trimethoprim, codeine, and sulfa antibiotics.   Social History: Patient  reports that she has never smoked. She has never used smokeless tobacco. She reports current alcohol use. She reports that she does not use drugs.      OBJECTIVE     Physical Exam: Vitals:   05/11/22 1247  BP: (!) 169/85  Pulse: 66   Gen: No apparent distress, A&O x 3.  Detailed Urogynecologic Evaluation:  Deferred.    ASSESSMENT AND PLAN    Ashley Rodriguez is a 72 y.o. with:  1. SUI (stress urinary incontinence, female)   2. Overactive bladder    - We discussed options for SUI and OAB.  -  She has been working on decreasing bladder irritants. She does not want to start a medication at this time.   Plan for surgery: Exam under anesthesia, midurethral sling, cystoscopy  - We reviewed the patient's specific anatomic and functional findings, with the assistance of diagrams, and together finalized the above procedure. The planned surgical procedures were discussed along with the surgical risks outlined below, which were also provided on a detailed handout. Additional treatment options including expectant management, conservative management, medical management were discussed where appropriate.  We reviewed the benefits and risks of each treatment option.   General Surgical Risks: For all procedures, there are risks of bleeding, infection, damage to surrounding organs including but not limited to bowel, bladder, blood vessels, ureters and nerves, and need for further surgery if an injury were to occur. These risks are all low with minimally invasive surgery.   There are risks of numbness and weakness at any body site or buttock/rectal pain.  It is possible that baseline pain can be worsened by surgery, either with or without mesh. If surgery is vaginal, there is also a low risk of possible conversion to laparoscopy or open abdominal incision where indicated. Very rare risks include blood transfusion, blood clot, heart attack, pneumonia, or death.   There is also a risk of short-term postoperative urinary retention with need to use a catheter. About half of patients need to go home from surgery with a catheter, which is then later removed in the office. The risk of long-term need for a catheter is very low. There is also a risk of worsening of overactive bladder.   Sling: The effectiveness of a midurethral vaginal mesh sling is approximately 85%, and thus, there will be times when you may leak urine after surgery, especially if your bladder is full or  if you have a strong cough. There is a  balance between making the sling tight enough to treat your leakage but not too tight so that you have long-term difficulty emptying your bladder. A mesh sling will not directly treat overactive bladder/urge incontinence and may worsen it.  There is an FDA safety notification on vaginal mesh procedures for prolapse but NOT mesh slings. We have extensive experience and training with mesh placement and we have close postoperative follow up to identify any potential complications from mesh. It is important to realize that this mesh is a permanent implant that cannot be easily removed. There are rare risks of mesh exposure (2-4%), pain with intercourse (0-7%), and infection (<1%). The risk of mesh exposure if more likely in a woman with risks for poor healing (prior radiation, poorly controlled diabetes, or immunocompromised). The risk of new or worsened chronic pain after mesh implant is more common in women with baseline chronic pain and/or poorly controlled anxiety or depression. Approximately 2-4% of patients will experience longer-term post-operative voiding dysfunction that may require surgical revision of the sling. We also reviewed that postoperatively, her stream may not be as strong as before surgery.   - For preop Visit:  She is required to have a visit within 30 days of her surgery.   Today we reviewed pre-operative preparation, peri-operative expectations, and post-operative instructions/recovery.  She was provided with instructional handouts. She understands not to take aspirin (>'81mg'$ ) or NSAIDs 7 days prior to surgery.  Prescriptions will be sent prior to surgery.  - Medical clearance: Not required. Hgb A1c in November was 6.2% - Anticoagulant use: No - Medicaid Hysterectomy form: n/a - Accepts blood transfusion: Yes - Expected length of stay: outpatient  Request sent for surgery scheduling.   Jaquita Folds, MD

## 2022-05-11 NOTE — Telephone Encounter (Signed)
Chris calling from [facility] is calling to request pen needles for the Semaglutide,0.25 or 0.'5MG'$ /DOS, (OZEMPIC, 0.25 OR 0.5 MG/DOSE,) 2 MG/3ML SOPN [[id number]] [phone]

## 2022-05-11 NOTE — Patient Instructions (Signed)
Plan for surgery: Sling, cystoscopy

## 2022-05-13 DIAGNOSIS — B349 Viral infection, unspecified: Secondary | ICD-10-CM | POA: Diagnosis not present

## 2022-05-13 DIAGNOSIS — R5381 Other malaise: Secondary | ICD-10-CM | POA: Diagnosis not present

## 2022-05-13 DIAGNOSIS — M1990 Unspecified osteoarthritis, unspecified site: Secondary | ICD-10-CM | POA: Diagnosis not present

## 2022-05-13 DIAGNOSIS — Z79899 Other long term (current) drug therapy: Secondary | ICD-10-CM | POA: Diagnosis not present

## 2022-05-13 DIAGNOSIS — I10 Essential (primary) hypertension: Secondary | ICD-10-CM | POA: Diagnosis not present

## 2022-05-13 DIAGNOSIS — Z791 Long term (current) use of non-steroidal anti-inflammatories (NSAID): Secondary | ICD-10-CM | POA: Diagnosis not present

## 2022-05-13 DIAGNOSIS — R519 Headache, unspecified: Secondary | ICD-10-CM | POA: Diagnosis not present

## 2022-05-13 DIAGNOSIS — H9203 Otalgia, bilateral: Secondary | ICD-10-CM | POA: Diagnosis not present

## 2022-05-13 DIAGNOSIS — Z20822 Contact with and (suspected) exposure to covid-19: Secondary | ICD-10-CM | POA: Diagnosis not present

## 2022-05-14 ENCOUNTER — Encounter: Payer: Self-pay | Admitting: Family Medicine

## 2022-05-15 MED ORDER — BENZONATATE 100 MG PO CAPS: 100.0000 mg | ORAL_CAPSULE | Freq: Two times a day (BID) | ORAL | 0 refills | Status: DC | PRN

## 2022-05-17 ENCOUNTER — Encounter: Payer: Self-pay | Admitting: Family Medicine

## 2022-05-17 ENCOUNTER — Ambulatory Visit (INDEPENDENT_AMBULATORY_CARE_PROVIDER_SITE_OTHER): Payer: Medicare Other | Admitting: Family Medicine

## 2022-05-17 VITALS — BP 114/72 | HR 61 | Temp 97.6°F | Resp 16 | Wt 150.0 lb

## 2022-05-17 DIAGNOSIS — B309 Viral conjunctivitis, unspecified: Secondary | ICD-10-CM | POA: Diagnosis not present

## 2022-05-17 NOTE — Progress Notes (Signed)
I,Sulibeya S Dimas,acting as a Education administrator for Lavon Paganini, MD.,have documented all relevant documentation on the behalf of Lavon Paganini, MD,as directed by  Lavon Paganini, MD while in the presence of Lavon Paganini, MD.   Established patient visit   Patient: Ashley Rodriguez   DOB: 03-08-1951   72 y.o. Female  MRN: [id number] Visit Date: 05/17/2022  Today's healthcare provider: Lavon Paganini, MD   Chief Complaint  Patient presents with   URI   Subjective    HPI  Follow up ER visit  Patient was seen in ER for URI on 05/13/22. She was treated for viral illness. Treatment for this included butalbital-acetaminophen-caffeine, Robitussin-DM. Amoxicillin to take if symptoms persist.  She reports excellent compliance with treatment. She reports this condition is Improved. 05/15/22 start tessalon 100 mg.  Patient is now C/O eye discharge and redness.  -----------------------------------------------------------------------------------------   Medications: Outpatient Medications Prior to Visit  Medication Sig   acetaminophen (TYLENOL) 325 MG tablet Take 325 mg by mouth every 6 (six) hours as needed.   amLODipine (NORVASC) 5 MG tablet TAKE 1 TABLET BY MOUTH ONCE DAILY AS NEEDED FOR BLOOD PRESSURE   atenolol (TENORMIN) 50 MG tablet TAKE 1 TABLET BY MOUTH ONCE DAILY   benzonatate (TESSALON) 100 MG capsule Take 1 capsule (100 mg total) by mouth 2 (two) times daily as needed for cough.   betamethasone dipropionate 0.05 % lotion Apply 1 application topically as needed.    calcium carbonate (OS-CAL) 600 MG TABS tablet Take 1,200 mg by mouth daily with breakfast.   co-enzyme Q-10 30 MG capsule Take 100 mg by mouth daily.   ENBREL SURECLICK 50 MG/ML injection Inject 50 mg into the skin once a week.    esomeprazole (NEXIUM) 40 MG capsule TAKE 1 CAPSULE BY MOUTH ONCE DAILY   ezetimibe (ZETIA) 10 MG tablet Take 1 tablet (10 mg total) by mouth 2 (two) times a week.   fexofenadine  (ALLEGRA) 180 MG tablet Take 180 mg by mouth daily.   gabapentin (NEURONTIN) 300 MG capsule TAKE 2 CAPSULES BY MOUTH EVERY MORNING AND 3 CAPSULES BY MOUTH EVERY NIGHT AT BEDTIME   Insulin Pen Needle (NOVOFINE PLUS PEN NEEDLE) 32G X 4 MM MISC To use with semaglutide weekly injection   losartan (COZAAR) 25 MG tablet TAKE 1/2 TABLET (12.5 MG TOTAL) BY MOUTHONCE DAILY   montelukast (SINGULAIR) 10 MG tablet TAKE 1 TABLET BY MOUTH AT BEDTIME   Multiple Vitamins-Minerals (MULTIVITAMIN WITH MINERALS) tablet Take 1 tablet by mouth daily.   Semaglutide,0.25 or 0.'5MG'$ /DOS, (OZEMPIC, 0.25 OR 0.5 MG/DOSE,) 2 MG/3ML SOPN Inject 0.25 mg into the skin once a week.   TURMERIC PO Take by mouth daily at 6 (six) AM.   valACYclovir (VALTREX) 1000 MG tablet TAKE 2 TABLETS BY MOUTH TWICE DAILY FOR 1 DAY WHEN FEVER BLISTER OCCURS   Facility-Administered Medications Prior to Visit  Medication Dose Route Frequency Provider   triamcinolone acetonide (KENALOG-40) injection 60 mg  60 mg Other Once Adams, Max T, DPM    Review of Systems  Constitutional:  Positive for fatigue. Negative for chills and fever.  Eyes:  Positive for discharge, redness, itching and visual disturbance. Negative for pain.  Respiratory:  Positive for cough. Negative for chest tightness, shortness of breath and wheezing.   Cardiovascular:  Negative for chest pain.  Gastrointestinal:  Positive for nausea. Negative for abdominal pain and vomiting.       Objective    BP 114/72 (BP Location: Left Arm, Patient Position:  Sitting, Cuff Size: Large)   Pulse 61   Temp 97.6 F (36.4 C) (Temporal)   Resp 16   Wt 150 lb (68 kg)   SpO2 99%   BMI 27.44 kg/m  BP Readings from Last 3 Encounters:  05/17/22 114/72  05/11/22 (!) 169/85  05/10/22 139/84   Wt Readings from Last 3 Encounters:  05/17/22 150 lb (68 kg)  05/01/22 149 lb (67.6 kg)  04/20/22 151 lb (68.5 kg)      Physical Exam Vitals reviewed.  Constitutional:      General: She is not  in acute distress.    Appearance: Normal appearance. She is well-developed. She is not diaphoretic.  HENT:     Head: Normocephalic and atraumatic.  Eyes:     General: Lids are normal. No scleral icterus.       Right eye: No discharge.        Left eye: No discharge.     Extraocular Movements: Extraocular movements intact.     Conjunctiva/sclera:     Right eye: Right conjunctiva is injected.     Left eye: Left conjunctiva is injected.     Pupils: Pupils are equal, round, and reactive to light.  Neck:     Thyroid: No thyromegaly.  Cardiovascular:     Rate and Rhythm: Normal rate and regular rhythm.     Pulses: Normal pulses.     Heart sounds: Normal heart sounds. No murmur heard. Pulmonary:     Effort: Pulmonary effort is normal. No respiratory distress.     Breath sounds: Normal breath sounds. No wheezing or rales.  Musculoskeletal:     Cervical back: Neck supple.     Right lower leg: No edema.     Left lower leg: No edema.  Lymphadenopathy:     Cervical: No cervical adenopathy.  Skin:    General: Skin is warm and dry.  Neurological:     Mental Status: She is alert.      No results found for any visits on 05/17/22.  Assessment & Plan     1. Viral conjunctivitis - symptoms and exam c/w viral conjunctivitis - no evidence of bacterial infection - patient is already on abx for sinusitis  - discussed symptomatic management, natural course, and return precautions   Return if symptoms worsen or fail to improve.      I, Lavon Paganini, MD, have reviewed all documentation for this visit. The documentation on 05/17/22 for the exam, diagnosis, procedures, and orders are all accurate and complete.   Vidal Lampkins, Dionne Bucy, MD, MPH [facility]

## 2022-05-23 DIAGNOSIS — C44729 Squamous cell carcinoma of skin of left lower limb, including hip: Secondary | ICD-10-CM | POA: Diagnosis not present

## 2022-05-28 DIAGNOSIS — M5136 Other intervertebral disc degeneration, lumbar region: Secondary | ICD-10-CM | POA: Diagnosis not present

## 2022-05-28 DIAGNOSIS — L409 Psoriasis, unspecified: Secondary | ICD-10-CM | POA: Diagnosis not present

## 2022-05-28 DIAGNOSIS — L4050 Arthropathic psoriasis, unspecified: Secondary | ICD-10-CM | POA: Diagnosis not present

## 2022-05-28 DIAGNOSIS — M1991 Primary osteoarthritis, unspecified site: Secondary | ICD-10-CM | POA: Diagnosis not present

## 2022-05-28 DIAGNOSIS — Z79899 Other long term (current) drug therapy: Secondary | ICD-10-CM | POA: Diagnosis not present

## 2022-05-28 DIAGNOSIS — R5383 Other fatigue: Secondary | ICD-10-CM | POA: Diagnosis not present

## 2022-05-29 DIAGNOSIS — J301 Allergic rhinitis due to pollen: Secondary | ICD-10-CM | POA: Diagnosis not present

## 2022-05-29 DIAGNOSIS — H6982 Other specified disorders of Eustachian tube, left ear: Secondary | ICD-10-CM | POA: Diagnosis not present

## 2022-05-29 DIAGNOSIS — J343 Hypertrophy of nasal turbinates: Secondary | ICD-10-CM | POA: Diagnosis not present

## 2022-05-29 DIAGNOSIS — J3489 Other specified disorders of nose and nasal sinuses: Secondary | ICD-10-CM | POA: Diagnosis not present

## 2022-05-29 DIAGNOSIS — J342 Deviated nasal septum: Secondary | ICD-10-CM | POA: Diagnosis not present

## 2022-06-04 NOTE — Progress Notes (Unsigned)
PCP: Virginia Crews, MD   No chief complaint on file.   HPI:      [name] is a 72 y.o. G1P1001 whose LMP was No LMP recorded. Patient has had a hysterectomy., presents today for her NP annual examination.  Her menses are absent due to lap supracx hyst BSO 2000 She {does:18564} have vasomotor sx.   Sex activity: {sex active: 315163}. She {does:18564} have vaginal dryness.  Last Pap: {SEGB:151761607}  Results were: {norm/abn:16707::"no abnormalities"} /neg HPV DNA.  Hx of STDs: {STD hx:14358}  Last mammogram: 03/27/22  Results were: normal--routine follow-up in 12 months There is no FH of breast cancer. There is no FH of ovarian cancer. The patient {does:18564} do self-breast exams.  Colonoscopy: {hx:15363}  Repeat due after 10*** years.   Tobacco use: {tob:20664} Alcohol use: {Alcohol:11675} No drug use Exercise: {exercise:31265}  She {does:18564} get adequate calcium and Vitamin D in her diet.  Labs with PCP.   Patient Active Problem List   Diagnosis Date Noted   Tinnitus of both ears 03/06/2022   Stress incontinence 03/06/2022   Breast pain, right 03/06/2022   Excessive daytime sleepiness 03/06/2022   Cystocele, midline 03/06/2022   Fatigue 05/18/2021   Other long term (current) drug therapy 05/18/2021   Psoriasis 05/18/2021   Sciatica, left side 05/18/2021   DOE (dyspnea on exertion) 03/17/2021   Allergic rhinitis with postnasal drip 07/04/2020   Chronic sacroiliac joint pain (Left) 03/31/2020   Osteoarthritis of sacroiliac joint (Left) ([place]) 03/31/2020   Somatic dysfunction of sacroiliac joint (Left) 03/31/2020   Chronic sacroiliac joint pain (Right) 03/17/2020   Osteoarthritis of sacroiliac joint ([place]) (Right) 03/17/2020   Enthesopathy of sacroiliac joint 03/17/2020   Chronic low back pain (Bilateral) w/o sciatica 03/17/2020   Myalgia due to statin 01/25/2020   Other spondylosis, sacral and sacrococcygeal region 12/14/2019   Somatic  dysfunction of sacroiliac joints (Bilateral) 12/14/2019   Osteoarthritis of sacroiliac joints (Bilateral) 12/14/2019   History of claustrophobia 04/20/2019   Abnormal EMG (chronic right lower lumbar polyradiculopathy) 04/20/2019   Chronic lower lumbar polyradiculopathy (Right) 04/20/2019   DDD (degenerative disc disease), lumbosacral 04/20/2019   Lumbar spondylosis w/ polyradiculopathy (Right) 04/20/2019   Lumbosacral radiculopathy (Right) 04/20/2019   Other intervertebral disc degeneration, lumbar region 04/20/2019   Spondylolisthesis of lumbosacral region 04/20/2019   Numbness and tingling of foot 03/19/2019   Low back pain radiating to both legs 03/19/2019   Encounter for long-term (current) use of high-risk medication 10/23/2018   Generalized osteoarthritis of hand 10/23/2018   T2DM (type 2 diabetes mellitus) ([place]) 10/01/2018   Peripheral neuropathy 10/01/2018   Hyperlipidemia associated with type 2 diabetes mellitus ([place]) 10/01/2018   Hypertension associated with diabetes ([place]) 10/01/2018   Spondylosis without myelopathy or radiculopathy, lumbosacral region 08/19/2017   Osteoarthritis of lumbar spine 04/18/2017   Grade 1 Anterolisthesis of L5 over S1 01/16/2017   L5 pars defect with spondylolisthesis (Bilateral) 01/16/2017   NSAID long-term use 01/16/2017   Disorder of skeletal system 01/16/2017   Pharmacologic therapy 01/16/2017   Problems influencing health status 01/16/2017   DDD (degenerative disc disease), lumbar 01/16/2017   Lumbar facet syndrome (Bilateral) (R>L) 01/16/2017   Numbness and tingling (lower extremities) (Bilateral) 01/16/2017   Chronic pain syndrome 01/16/2017   Chronic musculoskeletal pain 01/16/2017   Neurogenic pain 01/16/2017   Chronic sacroiliac joint pain (Bilateral) (R>L) 01/16/2017   Chronic low back pain (1ry area of Pain) (Bilateral) (R>L) w/ sciatica (Bilateral) 01/03/2017   Chronic lower extremity  pain (2ry area of Pain) (Bilateral) (R>L)  01/03/2017   Psoriatic arthritis ([place]) 03/04/2013    Past Surgical History:  Procedure Laterality Date   COLONOSCOPY  04/30/2013   lipoma removed  04/30/2012   on neck   NECK MASS EXCISION  04/28/2014   lipoma   SUPRACERVICAL ABDOMINAL HYSTERECTOMY  04/30/1998   with BSO    Family History  Problem Relation Age of Onset   Hypertension Mother    Atrial fibrillation Mother    Cancer Father        bladder   Atrial fibrillation Brother    Supraventricular tachycardia Brother    Arrhythmia Nephew    Breast cancer Neg Hx    Colon cancer Neg Hx     Social History   Socioeconomic History   Marital status: Significant Other    Spouse name: Not on file   Number of children: 1   Years of education: Not on file   Highest education level: Some college, no degree  Occupational History   Occupation: Marine scientist    Comment: retired  Tobacco Use   Smoking status: Never   Smokeless tobacco: Never  Vaping Use   Vaping Use: Never used  Substance and Sexual Activity   Alcohol use: Yes    Alcohol/week: 0.0 - 4.0 standard drinks of alcohol    Comment: no more han 2 drinks at a time   Drug use: No   Sexual activity: Yes  Other Topics Concern   Not on file  Social History Narrative   Not on file   Social Determinants of Health   Financial Resource Strain: Low Risk  (07/10/2021)   Overall Financial Resource Strain (CARDIA)    Difficulty of Paying Living Expenses: Not hard at all  Food Insecurity: No Food Insecurity (07/10/2021)   Hunger Vital Sign    Worried About Running Out of Food in the Last Year: Never true    [place] in the Last Year: Never true  Transportation Needs: No Transportation Needs (07/10/2021)   PRAPARE - Hydrologist (Medical): No    Lack of Transportation (Non-Medical): No  Physical Activity: Sufficiently Active (07/10/2021)   Exercise Vital Sign    Days of Exercise per Week: 3 days    Minutes of Exercise  per Session: 60 min  Stress: No Stress Concern Present (07/10/2021)   [place]    Feeling of Stress : Only a little  Social Connections: Moderately Integrated (07/10/2021)   Social Connection and Isolation Panel [NHANES]    Frequency of Communication with Friends and Family: More than three times a week    Frequency of Social Gatherings with Friends and Family: More than three times a week    Attends Religious Services: More than 4 times per year    Active Member of [employer] or Organizations: No    Attends Archivist Meetings: Never    Marital Status: Living with partner  Intimate Partner Violence: Not At Risk (07/10/2021)   Humiliation, Afraid, Rape, and Kick questionnaire    Fear of Current or Ex-Partner: No    Emotionally Abused: No    Physically Abused: No    Sexually Abused: No     Current Outpatient Medications:    acetaminophen (TYLENOL) 325 MG tablet, Take 325 mg by mouth every 6 (six) hours as needed., Disp: , Rfl:    amLODipine (NORVASC) 5 MG tablet, TAKE 1  TABLET BY MOUTH ONCE DAILY AS NEEDED FOR BLOOD PRESSURE, Disp: 90 tablet, Rfl: 3   atenolol (TENORMIN) 50 MG tablet, TAKE 1 TABLET BY MOUTH ONCE DAILY, Disp: 90 tablet, Rfl: 1   benzonatate (TESSALON) 100 MG capsule, Take 1 capsule (100 mg total) by mouth 2 (two) times daily as needed for cough., Disp: 20 capsule, Rfl: 0   betamethasone dipropionate 0.05 % lotion, Apply 1 application topically as needed. , Disp: , Rfl:    calcium carbonate (OS-CAL) 600 MG TABS tablet, Take 1,200 mg by mouth daily with breakfast., Disp: , Rfl:    co-enzyme Q-10 30 MG capsule, Take 100 mg by mouth daily., Disp: , Rfl:    ENBREL SURECLICK 50 MG/ML injection, Inject 50 mg into the skin once a week. , Disp: , Rfl: 4   esomeprazole (NEXIUM) 40 MG capsule, TAKE 1 CAPSULE BY MOUTH ONCE DAILY, Disp: 90 capsule, Rfl: 2   ezetimibe (ZETIA) 10 MG tablet, Take 1 tablet (10 mg  total) by mouth 2 (two) times a week., Disp: 45 tablet, Rfl: 0   fexofenadine (ALLEGRA) 180 MG tablet, Take 180 mg by mouth daily., Disp: , Rfl:    gabapentin (NEURONTIN) 300 MG capsule, TAKE 2 CAPSULES BY MOUTH EVERY MORNING AND 3 CAPSULES BY MOUTH EVERY NIGHT AT BEDTIME, Disp: 450 capsule, Rfl: 3   Insulin Pen Needle (NOVOFINE PLUS PEN NEEDLE) 32G X 4 MM MISC, To use with semaglutide weekly injection, Disp: 30 each, Rfl: 1   losartan (COZAAR) 25 MG tablet, TAKE 1/2 TABLET (12.5 MG TOTAL) BY MOUTHONCE DAILY, Disp: 45 tablet, Rfl: 1   montelukast (SINGULAIR) 10 MG tablet, TAKE 1 TABLET BY MOUTH AT BEDTIME, Disp: 90 tablet, Rfl: 3   Multiple Vitamins-Minerals (MULTIVITAMIN WITH MINERALS) tablet, Take 1 tablet by mouth daily., Disp: , Rfl:    Semaglutide,0.25 or 0.'5MG'$ /DOS, (OZEMPIC, 0.25 OR 0.5 MG/DOSE,) 2 MG/3ML SOPN, Inject 0.25 mg into the skin once a week., Disp: 3 mL, Rfl: 3   TURMERIC PO, Take by mouth daily at 6 (six) AM., Disp: , Rfl:    valACYclovir (VALTREX) 1000 MG tablet, TAKE 2 TABLETS BY MOUTH TWICE DAILY FOR 1 DAY WHEN FEVER BLISTER OCCURS, Disp: 30 tablet, Rfl: 0  Current Facility-Administered Medications:    triamcinolone acetonide (KENALOG-40) injection 60 mg, 60 mg, Other, Once, [place], Max T, DPM     ROS:  Review of Systems BREAST: No symptoms    Objective: There were no vitals taken for this visit.   OBGyn Exam  Results: No results found for this or any previous visit (from the past 24 hour(s)).  Assessment/Plan:  No diagnosis found.   No orders of the defined types were placed in this encounter.           GYN counsel {counseling: 16159}    F/U  No follow-ups on file.  Latoia Eyster B. Neiman Roots, PA-C 06/04/2022 4:41 PM

## 2022-06-05 ENCOUNTER — Other Ambulatory Visit (HOSPITAL_COMMUNITY)
Admission: RE | Admit: 2022-06-05 | Discharge: 2022-06-05 | Disposition: A | Discharge status: Home / Self Care | Payer: Medicare Other | Source: Ambulatory Visit | Attending: Obstetrics and Gynecology | Admitting: Obstetrics and Gynecology

## 2022-06-05 ENCOUNTER — Encounter: Payer: Self-pay | Admitting: Obstetrics and Gynecology

## 2022-06-05 ENCOUNTER — Ambulatory Visit (INDEPENDENT_AMBULATORY_CARE_PROVIDER_SITE_OTHER): Payer: Medicare Other | Admitting: Obstetrics and Gynecology

## 2022-06-05 VITALS — BP 126/80 | Ht 62.0 in | Wt 149.0 lb

## 2022-06-05 DIAGNOSIS — Z01419 Encounter for gynecological examination (general) (routine) without abnormal findings: Secondary | ICD-10-CM | POA: Diagnosis not present

## 2022-06-05 DIAGNOSIS — Z124 Encounter for screening for malignant neoplasm of cervix: Secondary | ICD-10-CM | POA: Diagnosis present

## 2022-06-05 DIAGNOSIS — N941 Unspecified dyspareunia: Secondary | ICD-10-CM

## 2022-06-05 DIAGNOSIS — Z1151 Encounter for screening for human papillomavirus (HPV): Secondary | ICD-10-CM

## 2022-06-05 DIAGNOSIS — Z1211 Encounter for screening for malignant neoplasm of colon: Secondary | ICD-10-CM

## 2022-06-05 DIAGNOSIS — Z1231 Encounter for screening mammogram for malignant neoplasm of breast: Secondary | ICD-10-CM

## 2022-06-05 NOTE — Patient Instructions (Signed)
I value your feedback and you entrusting us with your care. If you get a [facility] patient survey, I would appreciate you taking the time to let us know about your experience today. Thank you! ? ? ?

## 2022-06-07 ENCOUNTER — Ambulatory Visit (INDEPENDENT_AMBULATORY_CARE_PROVIDER_SITE_OTHER): Payer: Medicare Other | Admitting: Family Medicine

## 2022-06-07 ENCOUNTER — Encounter: Payer: Self-pay | Admitting: Family Medicine

## 2022-06-07 VITALS — BP 127/79 | HR 86 | Temp 98.5°F | Resp 20 | Ht 62.0 in | Wt 143.6 lb

## 2022-06-07 DIAGNOSIS — R0602 Shortness of breath: Secondary | ICD-10-CM | POA: Diagnosis not present

## 2022-06-07 DIAGNOSIS — J029 Acute pharyngitis, unspecified: Secondary | ICD-10-CM

## 2022-06-07 DIAGNOSIS — R051 Acute cough: Secondary | ICD-10-CM | POA: Diagnosis not present

## 2022-06-07 DIAGNOSIS — U071 COVID-19: Secondary | ICD-10-CM

## 2022-06-07 LAB — CYTOLOGY - PAP
Comment: NEGATIVE
Diagnosis: NEGATIVE
High risk HPV: NEGATIVE

## 2022-06-07 LAB — POC COVID19 BINAXNOW: SARS Coronavirus 2 Ag: POSITIVE — AB

## 2022-06-07 LAB — POCT INFLUENZA A/B
Influenza A, POC: NEGATIVE
Influenza B, POC: NEGATIVE

## 2022-06-07 MED ORDER — ONDANSETRON 4 MG PO TBDP: 4.0000 mg | ORAL_TABLET | Freq: Three times a day (TID) | ORAL | 0 refills | Status: DC | PRN

## 2022-06-07 MED ORDER — NIRMATRELVIR/RITONAVIR (PAXLOVID)TABLET: 3.0000 | ORAL_TABLET | Freq: Two times a day (BID) | ORAL | 0 refills | Status: AC

## 2022-06-07 NOTE — Progress Notes (Signed)
I,Ashley Rodriguez,acting as a Education administrator for Lavon Paganini, MD.,have documented all relevant documentation on the behalf of Lavon Paganini, MD,as directed by  Lavon Paganini, MD while in the presence of Lavon Paganini, MD.    Established patient visit   Patient: Ashley Rodriguez   DOB: 31-Mar-1951   72 y.o. Female  MRN: [id number] Visit Date: 06/07/2022  Today's healthcare provider: Lavon Paganini, MD   Chief Complaint  Patient presents with   URI   Subjective    HPI  Upper respiratory symptoms She complains of achiness, congestion, non productive cough, and shortness of breath.with no fever, chills, night sweats or weight loss. Onset of symptoms was this morning and staying constant.She is drinking plenty of fluids.  Past history is significant for no history of pneumonia or bronchitis. Patient is non-smoker  Woke up with symptoms this AM Also with N/V x1 this AM. ---------------------------------------------------------------------------------------------------   Medications: Outpatient Medications Prior to Visit  Medication Sig   acetaminophen (TYLENOL) 325 MG tablet Take 325 mg by mouth every 6 (six) hours as needed.   amLODipine (NORVASC) 5 MG tablet TAKE 1 TABLET BY MOUTH ONCE DAILY AS NEEDED FOR BLOOD PRESSURE   atenolol (TENORMIN) 50 MG tablet TAKE 1 TABLET BY MOUTH ONCE DAILY   benzonatate (TESSALON) 100 MG capsule Take 1 capsule (100 mg total) by mouth 2 (two) times daily as needed for cough.   betamethasone dipropionate 0.05 % lotion Apply 1 application topically as needed.    calcium carbonate (OS-CAL) 600 MG TABS tablet Take 1,200 mg by mouth daily with breakfast.   co-enzyme Q-10 30 MG capsule Take 100 mg by mouth daily.   ENBREL SURECLICK 50 MG/ML injection Inject 50 mg into the skin once a week.    esomeprazole (NEXIUM) 40 MG capsule TAKE 1 CAPSULE BY MOUTH ONCE DAILY   ezetimibe (ZETIA) 10 MG tablet Take 1 tablet (10 mg total) by mouth 2 (two) times a  week.   fexofenadine (ALLEGRA) 180 MG tablet Take 180 mg by mouth daily.   gabapentin (NEURONTIN) 300 MG capsule TAKE 2 CAPSULES BY MOUTH EVERY MORNING AND 3 CAPSULES BY MOUTH EVERY NIGHT AT BEDTIME   Insulin Pen Needle (NOVOFINE PLUS PEN NEEDLE) 32G X 4 MM MISC To use with semaglutide weekly injection   losartan (COZAAR) 25 MG tablet TAKE 1/2 TABLET (12.5 MG TOTAL) BY MOUTHONCE DAILY   montelukast (SINGULAIR) 10 MG tablet TAKE 1 TABLET BY MOUTH AT BEDTIME   Multiple Vitamins-Minerals (MULTIVITAMIN WITH MINERALS) tablet Take 1 tablet by mouth daily.   Semaglutide,0.25 or 0.'5MG'$ /DOS, (OZEMPIC, 0.25 OR 0.5 MG/DOSE,) 2 MG/3ML SOPN Inject 0.25 mg into the skin once a week.   TURMERIC PO Take by mouth daily at 6 (six) AM.   valACYclovir (VALTREX) 1000 MG tablet TAKE 2 TABLETS BY MOUTH TWICE DAILY FOR 1 DAY WHEN FEVER BLISTER OCCURS   Facility-Administered Medications Prior to Visit  Medication Dose Route Frequency Provider   triamcinolone acetonide (KENALOG-40) injection 60 mg  60 mg Other Once [place], Max T, DPM    Review of Systems  Constitutional:  Positive for chills. Negative for fever.  HENT:  Positive for congestion and sore throat. Negative for ear discharge, postnasal drip, rhinorrhea, sinus pressure and sinus pain.   Respiratory:  Positive for cough and shortness of breath. Negative for wheezing.   Gastrointestinal:  Positive for abdominal pain, nausea and vomiting.       Objective    BP 127/79 (BP Location: Right Arm, Patient Position: Sitting,  Cuff Size: Large)   Pulse 86   Temp 98.5 F (36.9 C)   Resp 20   Ht '5\' 2"'$  (1.575 m)   Wt 143 lb 9.6 oz (65.1 kg)   SpO2 96%   BMI 26.26 kg/m    Physical Exam Vitals reviewed.  Constitutional:      General: She is not in acute distress.    Appearance: Normal appearance. She is well-developed. She is not diaphoretic.  HENT:     Head: Normocephalic and atraumatic.     Right Ear: Tympanic membrane, ear canal and external ear  normal.     Left Ear: Tympanic membrane, ear canal and external ear normal.     Nose: Nose normal.     Mouth/Throat:     Mouth: Mucous membranes are moist.     Pharynx: Oropharynx is clear. No oropharyngeal exudate.  Eyes:     General: No scleral icterus.    Conjunctiva/sclera: Conjunctivae normal.     Pupils: Pupils are equal, round, and reactive to light.  Neck:     Thyroid: No thyromegaly.  Cardiovascular:     Rate and Rhythm: Normal rate and regular rhythm.     Pulses: Normal pulses.     Heart sounds: Normal heart sounds. No murmur heard. Pulmonary:     Effort: Pulmonary effort is normal. No respiratory distress.     Breath sounds: Normal breath sounds. No wheezing or rales.  Abdominal:     General: There is no distension.     Palpations: Abdomen is soft.     Tenderness: There is no abdominal tenderness.  Musculoskeletal:     Cervical back: Neck supple.     Right lower leg: No edema.     Left lower leg: No edema.  Lymphadenopathy:     Cervical: No cervical adenopathy.  Skin:    General: Skin is warm and dry.     Findings: No rash.  Neurological:     Mental Status: She is alert. Mental status is at baseline.       Results for orders placed or performed in visit on 06/07/22  POC COVID-19  Result Value Ref Range   SARS Coronavirus 2 Ag Positive (A) Negative  POCT Influenza A/B  Result Value Ref Range   Influenza A, POC Negative Negative   Influenza B, POC Negative Negative    Assessment & Plan     1. Acute cough, nausea 2. COVID-19 - symptomatic and positive for COVID19  - no evidence of strep pharyngitis, CAP, AOM, bacterial sinusitis, or other bacterial infection  - discussed natural course, symptomatic management, and return precautions - Good candidate for Paxlovid - Rx'd today  - hold Enbrel while sick - discuss resuming with Rheum - hold amlodipine while on Paxlovid - discussed isolation guidelines - POC COVID-19 - POCT Influenza A/B   Meds ordered  this encounter  Medications   nirmatrelvir/ritonavir (PAXLOVID) 20 x 150 MG & 10 x '100MG'$  TABS    Sig: Take 3 tablets by mouth 2 (two) times daily for 5 days. (Take nirmatrelvir 150 mg two tablets twice daily for 5 days and ritonavir 100 mg one tablet twice daily for 5 days) Patient GFR is 95    Dispense:  30 tablet    Refill:  0   ondansetron (ZOFRAN-ODT) 4 MG disintegrating tablet    Sig: Take 1 tablet (4 mg total) by mouth every 8 (eight) hours as needed for nausea or vomiting.    Dispense:  20 tablet  Refill:  0      Return if symptoms worsen or fail to improve.      I, Lavon Paganini, MD, have reviewed all documentation for this visit. The documentation on 06/07/22 for the exam, diagnosis, procedures, and orders are all accurate and complete.   Keayra Graham, Dionne Bucy, MD, MPH [facility]

## 2022-06-11 ENCOUNTER — Encounter: Payer: Self-pay | Admitting: Family Medicine

## 2022-06-14 ENCOUNTER — Encounter: Payer: Self-pay | Admitting: Family Medicine

## 2022-06-18 ENCOUNTER — Encounter: Payer: Self-pay | Admitting: *Deleted

## 2022-06-21 DIAGNOSIS — E119 Type 2 diabetes mellitus without complications: Secondary | ICD-10-CM | POA: Diagnosis not present

## 2022-06-21 DIAGNOSIS — D3131 Benign neoplasm of right choroid: Secondary | ICD-10-CM | POA: Diagnosis not present

## 2022-06-21 DIAGNOSIS — H5203 Hypermetropia, bilateral: Secondary | ICD-10-CM | POA: Diagnosis not present

## 2022-06-21 LAB — HM DIABETES EYE EXAM

## 2022-06-22 ENCOUNTER — Encounter: Payer: Self-pay | Admitting: Family Medicine

## 2022-06-25 ENCOUNTER — Telehealth: Payer: Self-pay | Admitting: Pain Medicine

## 2022-06-25 NOTE — Telephone Encounter (Signed)
Patient is taking Ozempic. How long does she have to stop this before procedere. She is scheduled for March 12

## 2022-06-25 NOTE — Telephone Encounter (Signed)
No restrictions at our clinic at this time. Patient called and made aware.

## 2022-07-03 DIAGNOSIS — D473 Essential (hemorrhagic) thrombocythemia: Secondary | ICD-10-CM | POA: Diagnosis not present

## 2022-07-03 DIAGNOSIS — M5416 Radiculopathy, lumbar region: Secondary | ICD-10-CM | POA: Diagnosis not present

## 2022-07-03 DIAGNOSIS — M542 Cervicalgia: Secondary | ICD-10-CM | POA: Diagnosis not present

## 2022-07-03 DIAGNOSIS — M9903 Segmental and somatic dysfunction of lumbar region: Secondary | ICD-10-CM | POA: Diagnosis not present

## 2022-07-03 DIAGNOSIS — M9901 Segmental and somatic dysfunction of cervical region: Secondary | ICD-10-CM | POA: Diagnosis not present

## 2022-07-06 ENCOUNTER — Ambulatory Visit: Payer: Medicare Other | Admitting: Obstetrics and Gynecology

## 2022-07-10 ENCOUNTER — Other Ambulatory Visit (HOSPITAL_BASED_OUTPATIENT_CLINIC_OR_DEPARTMENT_OTHER): Payer: Medicare Other | Admitting: Pain Medicine

## 2022-07-10 ENCOUNTER — Ambulatory Visit: Payer: Medicare Other | Attending: Pain Medicine | Admitting: Pain Medicine

## 2022-07-10 ENCOUNTER — Ambulatory Visit
Admission: RE | Admit: 2022-07-10 | Discharge: 2022-07-10 | Disposition: A | Discharge status: Home / Self Care | Payer: Medicare Other | Source: Ambulatory Visit | Attending: Pain Medicine | Admitting: Pain Medicine

## 2022-07-10 ENCOUNTER — Encounter: Payer: Self-pay | Admitting: Pain Medicine

## 2022-07-10 VITALS — BP 157/88 | HR 70 | Temp 97.2°F | Resp 16 | Ht 62.0 in | Wt 144.0 lb

## 2022-07-10 DIAGNOSIS — M545 Low back pain, unspecified: Secondary | ICD-10-CM | POA: Insufficient documentation

## 2022-07-10 DIAGNOSIS — G8918 Other acute postprocedural pain: Secondary | ICD-10-CM

## 2022-07-10 DIAGNOSIS — M47817 Spondylosis without myelopathy or radiculopathy, lumbosacral region: Secondary | ICD-10-CM | POA: Diagnosis not present

## 2022-07-10 DIAGNOSIS — G8929 Other chronic pain: Secondary | ICD-10-CM | POA: Diagnosis not present

## 2022-07-10 DIAGNOSIS — M47816 Spondylosis without myelopathy or radiculopathy, lumbar region: Secondary | ICD-10-CM | POA: Diagnosis not present

## 2022-07-10 DIAGNOSIS — M4310 Spondylolisthesis, site unspecified: Secondary | ICD-10-CM | POA: Insufficient documentation

## 2022-07-10 DIAGNOSIS — M5137 Other intervertebral disc degeneration, lumbosacral region: Secondary | ICD-10-CM | POA: Insufficient documentation

## 2022-07-10 MED ORDER — LIDOCAINE HCL 2 % IJ SOLN
INTRAMUSCULAR | Status: AC
  Filled 2022-07-10: qty 20

## 2022-07-10 MED ORDER — MIDAZOLAM HCL 5 MG/5ML IJ SOLN
0.5000 mg | Freq: Once | INTRAMUSCULAR | Status: AC
  Administered 2022-07-10: 1.5 mg via INTRAVENOUS

## 2022-07-10 MED ORDER — LIDOCAINE HCL 2 % IJ SOLN
20.0000 mL | Freq: Once | INTRAMUSCULAR | Status: AC
  Administered 2022-07-10: 400 mg

## 2022-07-10 MED ORDER — TRIAMCINOLONE ACETONIDE 40 MG/ML IJ SUSP
40.0000 mg | Freq: Once | INTRAMUSCULAR | Status: AC
  Administered 2022-07-10: 40 mg

## 2022-07-10 MED ORDER — HYDROCODONE-ACETAMINOPHEN 5-325 MG PO TABS: 1.0000 | ORAL_TABLET | Freq: Three times a day (TID) | ORAL | 0 refills | Status: AC | PRN

## 2022-07-10 MED ORDER — FENTANYL CITRATE (PF) 100 MCG/2ML IJ SOLN: 25.0000 ug | INTRAMUSCULAR | Status: DC | PRN

## 2022-07-10 MED ORDER — ROPIVACAINE HCL 2 MG/ML IJ SOLN
9.0000 mL | Freq: Once | INTRAMUSCULAR | Status: AC
  Administered 2022-07-10: 9 mL via PERINEURAL

## 2022-07-10 MED ORDER — ROPIVACAINE HCL 2 MG/ML IJ SOLN
INTRAMUSCULAR | Status: AC
  Filled 2022-07-10: qty 20

## 2022-07-10 MED ORDER — LACTATED RINGERS IV SOLN: Freq: Once | INTRAVENOUS | Status: AC

## 2022-07-10 MED ORDER — PENTAFLUOROPROP-TETRAFLUOROETH EX AERO
INHALATION_SPRAY | Freq: Once | CUTANEOUS | Status: AC
  Filled 2022-07-10: qty 116

## 2022-07-10 MED ORDER — MIDAZOLAM HCL 5 MG/5ML IJ SOLN
INTRAMUSCULAR | Status: AC
  Filled 2022-07-10: qty 5

## 2022-07-10 MED ORDER — FENTANYL CITRATE (PF) 100 MCG/2ML IJ SOLN
INTRAMUSCULAR | Status: AC
  Filled 2022-07-10: qty 2

## 2022-07-10 MED ORDER — TRIAMCINOLONE ACETONIDE 40 MG/ML IJ SUSP
INTRAMUSCULAR | Status: AC
  Filled 2022-07-10: qty 1

## 2022-07-10 NOTE — Progress Notes (Signed)
PROVIDER NOTE: Interpretation of information contained herein should be left to medically-trained personnel. Specific patient instructions are provided elsewhere under "Patient Instructions" section of medical record. This document was created in part using STT-dictation technology, any transcriptional errors that may result from this process are unintentional.  Patient: Ashley Rodriguez Type: Established DOB: 1950-11-11 MRN: [id number] PCP: Virginia Crews, MD  Service: Procedure DOS: 07/10/2022 Setting: Ambulatory Location: Ambulatory outpatient facility Delivery: Face-to-face Provider: Gaspar Cola, MD Specialty: Interventional Pain Management Specialty designation: 09 Location: Outpatient facility Ref. Prov.: Milinda Pointer, MD       Interventional Therapy   Procedure: Lumbar Facet, Medial Branch Radiofrequency Ablation (RFA) #2  Laterality: Right (-RT)  Level: L2, L3, L4, L5, and S1 Medial Branch Level(s). These levels will denervate the L3-4, L4-5, and L5-S1 lumbar facet joints.  Imaging: Fluoroscopy-guided         Anesthesia: Local anesthesia (1-2% Lidocaine) Anxiolysis: IV Versed         Sedation: Moderate Sedation                       DOS: 07/10/2022  Performed by: Gaspar Cola, MD  Purpose: Therapeutic/Palliative Indications: Low back pain severe enough to impact quality of life or function. Indications: 1. Lumbar facet syndrome (Bilateral) (R>L)   2. Grade 1 Anterolisthesis of L5 over S1   3. L5 pars defect with spondylolisthesis (Bilateral)   4. Spondylosis without myelopathy or radiculopathy, lumbosacral region   5. DDD (degenerative disc disease), lumbosacral   6. Chronic low back pain (Bilateral) w/o sciatica    Ashley Rodriguez has been dealing with the above chronic pain for longer than three months and has either failed to respond, was unable to tolerate, or simply did not get enough benefit from other more conservative therapies including, but not  limited to: 1. Over-the-counter medications 2. Anti-inflammatory medications 3. Muscle relaxants 4. Membrane stabilizers 5. Opioids 6. Physical therapy and/or chiropractic manipulation 7. Modalities (Heat, ice, etc.) 8. Invasive techniques such as nerve blocks. Ashley Rodriguez has attained more than 50% relief of the pain from a series of diagnostic injections conducted in separate occasions.  Pain Score: Pre-procedure: 5 /10 Post-procedure: 0-No pain/10  Note: Today patient has requested to hold and not send opioid analgesics for postprocedure discomfort.     Position / Prep / Materials:  Position: Prone  Prep solution: DuraPrep (Iodine Povacrylex [0.7% available iodine] and Isopropyl Alcohol, 74% w/w) Prep Area: Entire Lumbosacral Region (Lower back from mid-thoracic region to end of tailbone and from flank to flank.) Materials:  Tray: RFA (Radiofrequency) tray Needle(s):  Type: RFA (Teflon-coated radiofrequency ablation needles) Gauge (G): 22  Length: Regular (10cm) Qty: 5      Pre-op H&P Assessment:  Ashley Rodriguez is a 72 y.o. (year old), female patient, seen today for interventional treatment. She  has a past surgical history that includes Supracervical abdominal hysterectomy (04/30/1998); lipoma removed (04/30/2012); Colonoscopy (04/30/2013); and Neck mass excision (04/28/2014). Ashley Rodriguez has a current medication list which includes the following prescription(s): acetaminophen, amlodipine, atenolol, betamethasone dipropionate, calcium carbonate, co-enzyme 123456, enbrel sureclick, esomeprazole, ezetimibe, fexofenadine, gabapentin, novofine plus pen needle, losartan, montelukast, multivitamin with minerals, ozempic (0.25 or 0.5 mg/dose), turmeric, valacyclovir, benzonatate, and ondansetron, and the following Facility-Administered Medications: fentanyl, lactated ringers, and triamcinolone acetonide. Her primarily concern today is the Back Pain (lower)  Initial Vital Signs:  Pulse/HCG  Rate: 70ECG Heart Rate: 65 (NSR) Temp: (!) 97.2 F (36.2 C) Resp: 14 BP: 131/87 SpO2:  100 %  BMI: Estimated body mass index is 26.34 kg/m as calculated from the following:   Height as of this encounter: '5\' 2"'$  (1.575 m).   Weight as of this encounter: 144 lb (65.3 kg).  Risk Assessment: Allergies: Reviewed. She is allergic to atorvastatin, sulfamethoxazole-trimethoprim, and codeine.  Allergy Precautions: None required Coagulopathies: Reviewed. None identified.  Blood-thinner therapy: None at this time Active Infection(s): Reviewed. None identified. Ashley Rodriguez is afebrile  Site Confirmation: Ashley Rodriguez was asked to confirm the procedure and laterality before marking the site Procedure checklist: Completed Consent: Before the procedure and under the influence of no sedative(s), amnesic(s), or anxiolytics, the patient was informed of the treatment options, risks and possible complications. To fulfill our ethical and legal obligations, as recommended by the American Medical Association's Code of Ethics, I have informed the patient of my clinical impression; the nature and purpose of the treatment or procedure; the risks, benefits, and possible complications of the intervention; the alternatives, including doing nothing; the risk(s) and benefit(s) of the alternative treatment(s) or procedure(s); and the risk(s) and benefit(s) of doing nothing. The patient was provided information about the general risks and possible complications associated with the procedure. These may include, but are not limited to: failure to achieve desired goals, infection, bleeding, organ or nerve damage, allergic reactions, paralysis, and death. In addition, the patient was informed of those risks and complications associated to Spine-related procedures, such as failure to decrease pain; infection (i.e.: Meningitis, epidural or intraspinal abscess); bleeding (i.e.: epidural hematoma, subarachnoid hemorrhage, or any other  type of intraspinal or peri-dural bleeding); organ or nerve damage (i.e.: Any type of peripheral nerve, nerve root, or spinal cord injury) with subsequent damage to sensory, motor, and/or autonomic systems, resulting in permanent pain, numbness, and/or weakness of one or several areas of the body; allergic reactions; (i.e.: anaphylactic reaction); and/or death. Furthermore, the patient was informed of those risks and complications associated with the medications. These include, but are not limited to: allergic reactions (i.e.: anaphylactic or anaphylactoid reaction(s)); adrenal axis suppression; blood sugar elevation that in diabetics may result in ketoacidosis or comma; water retention that in patients with history of congestive heart failure may result in shortness of breath, pulmonary edema, and decompensation with resultant heart failure; weight gain; swelling or edema; medication-induced neural toxicity; particulate matter embolism and blood vessel occlusion with resultant organ, and/or nervous system infarction; and/or aseptic necrosis of one or more joints. Finally, the patient was informed that Medicine is not an exact science; therefore, there is also the possibility of unforeseen or unpredictable risks and/or possible complications that may result in a catastrophic outcome. The patient indicated having understood very clearly. We have given the patient no guarantees and we have made no promises. Enough time was given to the patient to ask questions, all of which were answered to the patient's satisfaction. Ashley Rodriguez has indicated that she wanted to continue with the procedure. Attestation: I, the ordering provider, attest that I have discussed with the patient the benefits, risks, side-effects, alternatives, likelihood of achieving goals, and potential problems during recovery for the procedure that I have provided informed consent. Date  Time: 07/10/2022  9:22 AM   Pre-Procedure Preparation:   Monitoring: As per clinic protocol. Respiration, ETCO2, SpO2, BP, heart rate and rhythm monitor placed and checked for adequate function Safety Precautions: Patient was assessed for positional comfort and pressure points before starting the procedure. Time-out: I initiated and conducted the "Time-out" before starting the procedure, as per protocol.  The patient was asked to participate by confirming the accuracy of the "Time Out" information. Verification of the correct person, site, and procedure were performed and confirmed by me, the nursing staff, and the patient. "Time-out" conducted as per Joint Commission's Universal Protocol (UP.01.01.01). Time: 1037  Description of Procedure:          Laterality: See above. Levels:  See above. Safety Precautions: Aspiration looking for blood return was conducted prior to all injections. At no point did we inject any substances, as a needle was being advanced. Before injecting, the patient was told to immediately notify me if she was experiencing any new onset of "ringing in the ears, or metallic taste in the mouth". No attempts were made at seeking any paresthesias. Safe injection practices and needle disposal techniques used. Medications properly checked for expiration dates. SDV (single dose vial) medications used. After the completion of the procedure, all disposable equipment used was discarded in the proper designated medical waste containers. Local Anesthesia: Protocol guidelines were followed. The patient was positioned over the fluoroscopy table. The area was prepped in the usual manner. The time-out was completed. The target area was identified using fluoroscopy. A 12-in long, straight, sterile hemostat was used with fluoroscopic guidance to locate the targets for each level blocked. Once located, the skin was marked with an approved surgical skin marker. Once all sites were marked, the skin (epidermis, dermis, and hypodermis), as well as deeper tissues  (fat, connective tissue and muscle) were infiltrated with a small amount of a short-acting local anesthetic, loaded on a 10cc syringe with a 25G, 1.5-in  Needle. An appropriate amount of time was allowed for local anesthetics to take effect before proceeding to the next step. Technical description of process:  Radiofrequency Ablation (RFA) L2 Medial Branch Nerve RFA: The target area for the L2 medial branch is at the junction of the postero-lateral aspect of the superior articular process and the superior, posterior, and medial edge of the transverse process of L3. Under fluoroscopic guidance, a Radiofrequency needle was inserted until contact was made with os over the superior postero-lateral aspect of the pedicular shadow (target area). Sensory and motor testing was conducted to properly adjust the position of the needle. Once satisfactory placement of the needle was achieved, the numbing solution was slowly injected after negative aspiration for blood. 2.0 mL of the nerve block solution was injected without difficulty or complication. After waiting for at least 3 minutes, the ablation was performed. Once completed, the needle was removed intact. L3 Medial Branch Nerve RFA: The target area for the L3 medial branch is at the junction of the postero-lateral aspect of the superior articular process and the superior, posterior, and medial edge of the transverse process of L4. Under fluoroscopic guidance, a Radiofrequency needle was inserted until contact was made with os over the superior postero-lateral aspect of the pedicular shadow (target area). Sensory and motor testing was conducted to properly adjust the position of the needle. Once satisfactory placement of the needle was achieved, the numbing solution was slowly injected after negative aspiration for blood. 2.0 mL of the nerve block solution was injected without difficulty or complication. After waiting for at least 3 minutes, the ablation was performed.  Once completed, the needle was removed intact. L4 Medial Branch Nerve RFA: The target area for the L4 medial branch is at the junction of the postero-lateral aspect of the superior articular process and the superior, posterior, and medial edge of the transverse process of L5. Under  fluoroscopic guidance, a Radiofrequency needle was inserted until contact was made with os over the superior postero-lateral aspect of the pedicular shadow (target area). Sensory and motor testing was conducted to properly adjust the position of the needle. Once satisfactory placement of the needle was achieved, the numbing solution was slowly injected after negative aspiration for blood. 2.0 mL of the nerve block solution was injected without difficulty or complication. After waiting for at least 3 minutes, the ablation was performed. Once completed, the needle was removed intact. L5 Medial Branch Nerve RFA: The target area for the L5 medial branch is at the junction of the postero-lateral aspect of the superior articular process of S1 and the superior, posterior, and medial edge of the sacral ala. Under fluoroscopic guidance, a Radiofrequency needle was inserted until contact was made with os over the superior postero-lateral aspect of the pedicular shadow (target area). Sensory and motor testing was conducted to properly adjust the position of the needle. Once satisfactory placement of the needle was achieved, the numbing solution was slowly injected after negative aspiration for blood. 2.0 mL of the nerve block solution was injected without difficulty or complication. After waiting for at least 3 minutes, the ablation was performed. Once completed, the needle was removed intact. S1 Medial Branch Nerve RFA: The target area for the S1 medial branch is located inferior to the junction of the S1 superior articular process and the L5 inferior articular process, posterior, inferior, and lateral to the 6 o'clock position of the L5-S1  facet joint, just superior to the S1 posterior foramen. Under fluoroscopic guidance, the Radiofrequency needle was advanced until contact was made with os over the Target area. Sensory and motor testing was conducted to properly adjust the position of the needle. Once satisfactory placement of the needle was achieved, the numbing solution was slowly injected after negative aspiration for blood. 2.0 mL of the nerve block solution was injected without difficulty or complication. After waiting for at least 3 minutes, the ablation was performed. Once completed, the needle was removed intact. Radiofrequency lesioning (ablation):  Radiofrequency Generator: Medtronic AccurianTM AG 1000 RF Generator Sensory Stimulation Parameters: 50 Hz was used to locate & identify the nerve, making sure that the needle was positioned such that there was no sensory stimulation below 0.3 V or above 0.7 V. Motor Stimulation Parameters: 2 Hz was used to evaluate the motor component. Care was taken not to lesion any nerves that demonstrated motor stimulation of the lower extremities at an output of less than 2.5 times that of the sensory threshold, or a maximum of 2.0 V. Lesioning Technique Parameters: Standard Radiofrequency settings. (Not bipolar or pulsed.) Temperature Settings: 80 degrees C Lesioning time: 60 seconds Intra-operative Compliance: Compliant  Once the entire procedure was completed, the treated area was cleaned, making sure to leave some of the prepping solution back to take advantage of its long term bactericidal properties.    Illustration of the posterior view of the lumbar spine and the posterior neural structures. Laminae of L2 through S1 are labeled. DPRL5, dorsal primary ramus of L5; DPRS1, dorsal primary ramus of S1; DPR3, dorsal primary ramus of L3; FJ, facet (zygapophyseal) joint L3-L4; I, inferior articular process of L4; LB1, lateral branch of dorsal primary ramus of L1; IAB, inferior articular branches  from L3 medial branch (supplies L4-L5 facet joint); IBP, intermediate branch plexus; MB3, medial branch of dorsal primary ramus of L3; NR3, third lumbar nerve root; S, superior articular process of L5; SAB, superior articular branches  from L4 (supplies L4-5 facet joint also); TP3, transverse process of L3.  Vitals:   07/10/22 1055 07/10/22 1100 07/10/22 1104 07/10/22 1112  BP: (!) 154/84 (!) 157/75 (!) 143/74 (!) 157/88  Pulse:      Resp: '18 16 18 16  '$ Temp:    (!) 97.2 F (36.2 C)  TempSrc:    Temporal  SpO2: 100% 100% 100%   Weight:      Height:        Start Time: 1037 hrs. End Time: 1104 hrs.  Imaging Guidance (Spinal):          Type of Imaging Technique: Fluoroscopy Guidance (Spinal) Indication(s): Assistance in needle guidance and placement for procedures requiring needle placement in or near specific anatomical locations not easily accessible without such assistance. Exposure Time: Please see nurses notes. Contrast: None used. Fluoroscopic Guidance: I was personally present during the use of fluoroscopy. "Tunnel Vision Technique" used to obtain the best possible view of the target area. Parallax error corrected before commencing the procedure. "Direction-depth-direction" technique used to introduce the needle under continuous pulsed fluoroscopy. Once target was reached, antero-posterior, oblique, and lateral fluoroscopic projection used confirm needle placement in all planes. Images permanently stored in EMR. Interpretation: No contrast injected. I personally interpreted the imaging intraoperatively. Adequate needle placement confirmed in multiple planes. Permanent images saved into the patient's record.  Antibiotic Prophylaxis:   Anti-infectives (From admission, onward)    None      Indication(s): None identified  Post-operative Assessment:  Post-procedure Vital Signs:  Pulse/HCG Rate: 7064 (NSR) Temp: (!) 97.2 F (36.2 C) Resp: 16 BP: (!) 157/88 SpO2: 100 %  EBL:  None  Complications: No immediate post-treatment complications observed by team, or reported by patient.  Note: The patient tolerated the entire procedure well. A repeat set of vitals were taken after the procedure and the patient was kept under observation following institutional policy, for this type of procedure. Post-procedural neurological assessment was performed, showing return to baseline, prior to discharge. The patient was provided with post-procedure discharge instructions, including a section on how to identify potential problems. Should any problems arise concerning this procedure, the patient was given instructions to immediately contact us, at any time, without hesitation. In any case, we plan to contact the patient by telephone for a follow-up status report regarding this interventional procedure.  Comments:  No additional relevant information.  Plan of Care (POC)  Orders:  Orders Placed This Encounter  Procedures   Radiofrequency,Lumbar    Scheduling Instructions:     Side(s): Right-sided     Level: L3-4, L4-5, and L5-S1 Facets (L2, L3, L4, L5, and S1 Medial Branch)     Sedation: With Sedation.     Timeframe: Today    Order Specific Question:   Where will this procedure be performed?    Answer:   [facility]   [facility] C-ARM 1-60 MIN NO REPORT    Intraoperative interpretation by procedural physician at [facility].    Standing Status:   Standing    Number of Occurrences:   1    Order Specific Question:   Reason for exam:    Answer:   Assistance in needle guidance and placement for procedures requiring needle placement in or near specific anatomical locations not easily accessible without such assistance.   Informed Consent Details: Physician/Practitioner Attestation; Transcribe to consent form and obtain patient signature    Nursing Order: Transcribe to consent form and obtain patient signature. Note: Always confirm  laterality of pain with Ms.  Lilia Pro, before procedure.    Order Specific Question:   Physician/Practitioner attestation of informed consent for procedure/surgical case    Answer:   I, the physician/practitioner, attest that I have discussed with the patient the benefits, risks, side effects, alternatives, likelihood of achieving goals and potential problems during recovery for the procedure that I have provided informed consent.    Order Specific Question:   Procedure    Answer:   Lumbar Facet Radiofrequency Ablation    Order Specific Question:   Physician/Practitioner performing the procedure    Answer:   Erland Vivas A. Dossie Arbour, MD    Order Specific Question:   Indication/Reason    Answer:   Low Back Pain, with our without leg pain, due to Facet Joint Arthralgia (Joint Pain) known as Lumbar Facet Syndrome, secondary to Lumbar, and/or Lumbosacral Spondylosis (Arthritis of the Spine), without myelopathy or radiculopathy (Nerve Damage).   Provide equipment / supplies at bedside    Procedure tray: "Radiofrequency Tray" Additional material: Large hemostat (x1); Small hemostat (x1); Towels (x8); 4x4 sterile sponge pack (x1) Needle type: Teflon-coated Radiofrequency Needle (Disposable  single use) Size: Regular Quantity: 5    Standing Status:   Standing    Number of Occurrences:   1    Order Specific Question:   Specify    Answer:   Radiofrequency Tray   Chronic Opioid Analgesic:  No opioid analgesics from our practice.   Medications ordered for procedure: Meds ordered this encounter  Medications   lidocaine (XYLOCAINE) 2 % (with pres) injection 400 mg   pentafluoroprop-tetrafluoroeth (GEBAUERS) aerosol   lactated ringers infusion   midazolam (VERSED) 5 MG/5ML injection 0.5-2 mg    Make sure Flumazenil is available in the pyxis when using this medication. If oversedation occurs, administer 0.2 mg IV over 15 sec. If after 45 sec no response, administer 0.2 mg again over 1 min; may repeat at 1 min intervals; not to exceed 4  doses (1 mg)   fentaNYL (SUBLIMAZE) injection 25-50 mcg    Make sure Narcan is available in the pyxis when using this medication. In the event of respiratory depression (RR< 8/min): Titrate NARCAN (naloxone) in increments of 0.1 to 0.2 mg IV at 2-3 minute intervals, until desired degree of reversal.   ropivacaine (PF) 2 mg/mL (0.2%) (NAROPIN) injection 9 mL   triamcinolone acetonide (KENALOG-40) injection 40 mg   Medications administered: We administered lidocaine, pentafluoroprop-tetrafluoroeth, lactated ringers, midazolam, ropivacaine (PF) 2 mg/mL (0.2%), and triamcinolone acetonide.  See the medical record for exact dosing, route, and time of administration.  Follow-up plan:   Return in about 6 weeks (around 08/21/2022) for Proc-day (T,Th), (Face2F), (VV).       Interventional Therapies  Risk Factors  Considerations:  [employer] DENIED L-MRI (02/17/19)    Planned  Pending:   Therapeutic right (L4-5 & L5-S1) lumbar facet RFA #2    Under consideration:   Therapeutic/palliative bilateral lumbar facet + SI joint RFA #2    Completed:   Diagnostic/therapeutic right L4-5 LESI x1 (01/22/2019) (50/50/90 x2 weeks)  Therapeutic bilateral lumbar facet MBB x7 (04/10/2022) (100/100/95/90-100/duration of up to 8 months)  Therapeutic bilateral SI Blk x4 (11/09/2021) (100/100/80) (100/100/100/50)  Therapeutic right lumbar facet RFA x1 (07/21/2019) (100/100/75)  Therapeutic left lumbar facet RFA x1 (08/06/2019)  (100/100/75)  Therapeutic right SI RFA x1 (03/17/2020) (100/100/100/90-100)  Therapeutic left SI RFA x1 (03/31/2020) (100/100/100 x3 weeks/70)    Completed by other providers:      Therapeutic  Palliative (  PRN) options:   Palliative lumbar facet MBB  Therapeutic lumbar facet RFA  Therapeutic SI RFA  Palliative SI joint Blk  Therapeutic L4-5 LESI       Recent Visits Date Type Provider Dept  05/01/22 Office Visit Milinda Pointer, MD [facility]  Showing recent visits  within past 90 days and meeting all other requirements Today's Visits Date Type Provider Dept  07/10/22 Procedure visit Milinda Pointer, MD [facility]  Showing today's visits and meeting all other requirements Future Appointments Date Type Provider Dept  08/21/22 Appointment Milinda Pointer, MD [facility]  Showing future appointments within next 90 days and meeting all other requirements  Disposition: Discharge home  Discharge (Date  Time): 07/10/2022; 1121 hrs.   Primary Care Physician: Virginia Crews, MD Location: [facility] Note by: Gaspar Cola, MD (TTS technology used. I apologize for any typographical errors that were not detected and corrected.) Date: 07/10/2022; Time: 11:32 AM  Disclaimer:  Medicine is not an Chief Strategy Officer. The only guarantee in medicine is that nothing is guaranteed. It is important to note that the decision to proceed with this intervention was based on the information collected from the patient. The Data and conclusions were drawn from the patient's questionnaire, the interview, and the physical examination. Because the information was provided in large part by the patient, it cannot be guaranteed that it has not been purposely or unconsciously manipulated. Every effort has been made to obtain as much relevant data as possible for this evaluation. It is important to note that the conclusions that lead to this procedure are derived in large part from the available data. Always take into account that the treatment will also be dependent on availability of resources and existing treatment guidelines, considered by other Pain Management Practitioners as being common knowledge and practice, at the time of the intervention. For Medico-Legal purposes, it is also important to point out that variation in procedural techniques and pharmacological choices are the acceptable norm. The indications,  contraindications, technique, and results of the above procedure should only be interpreted and judged by a Board-Certified Interventional Pain Specialist with extensive familiarity and expertise in the same exact procedure and technique.

## 2022-07-10 NOTE — Progress Notes (Signed)
(  07/10/2022) the patient called later in the afternoon, the day of the radiofrequency ablation, indicating that she wanted for me to now send the pain medication to her pharmacy.

## 2022-07-10 NOTE — Patient Instructions (Signed)
___________________________________________________________________________________________  Post-Radiofrequency (RF) Discharge Instructions  You have just completed a Radiofrequency Neurotomy.  The following instructions will provide you with information and guidelines for self-care upon discharge.  If at any time you have questions or concerns please call your physician. DO NOT DRIVE YOURSELF!!  Instructions:  Apply ice: Fill a plastic sandwich bag with crushed ice. Cover it with a small towel and apply to injection site. Apply for 15 minutes then remove x 15 minutes. Repeat sequence on day of procedure, until you go to bed. The purpose is to minimize swelling and discomfort after procedure.  Apply heat: Apply heat to procedure site starting the day following the procedure. The purpose is to treat any soreness and discomfort from the procedure.  Food intake: No eating limitations, unless stipulated above.  Nevertheless, if you have had sedation, you may experience some nausea.  In this case, it may be wise to wait at least two hours prior to resuming regular diet.  Physical activities: Keep activities to a minimum for the first 8 hours after the procedure. For the first 24 hours after the procedure, do not drive a motor vehicle,  Operate heavy machinery, power tools, or handle any weapons.  Consider walking with the use of an assistive device or accompanied by an adult for the first 24 hours.  Do not drink alcoholic beverages including beer.  Do not make any important decisions or sign any legal documents. Go home and rest today.  Resume activities tomorrow, as tolerated.  Use caution in moving about as you may experience mild leg weakness.  Use caution in cooking, use of household electrical appliances and climbing steps.  Driving: If you have received any sedation, you are not allowed to drive for 24 hours after your procedure.  Blood thinner: Restart your blood thinner 6 hours after your  procedure. (Only for those taking blood thinners)  Insulin: As soon as you can eat, you may resume your normal dosing schedule. (Only for those taking insulin)  Medications: May resume pre-procedure medications.  Do not take any drugs, other than what has been prescribed to you.  Infection prevention: Keep procedure site clean and dry.  Post-procedure Pain Diary: Extremely important that this be done correctly and accurately. Recorded information will be used to determine the next step in treatment.  Pain evaluated is that of treated area only. Do not include pain from an untreated area.  Complete every hour, on the hour, for the initial 8 hours. Set an alarm to help you do this part accurately.  Do not go to sleep and have it completed later. It will not be accurate.  Follow-up appointment: Keep your follow-up appointment after the procedure. Usually 2-6 weeks after radiofrequency. Bring you pain diary. The information collected will be essential for your long-term care.   Expect:  From numbing medicine (AKA: Local Anesthetics): Numbness or decrease in pain.  Onset: Full effect within 15 minutes of injected.  Duration: It will depend on the type of local anesthetic used. On the average, 1 to 8 hours.   From steroids (when added): Decrease in swelling or inflammation. Once inflammation is improved, relief of the pain will follow.  Onset of benefits: Depends on the amount of swelling present. The more swelling, the longer it will take for the benefits to be seen. In some cases, up to 10 days.  Duration: Steroids will stay in the system x 2 weeks. Duration of benefits will depend on multiple posibilities including persistent irritating factors.    this may last as long as 6 weeks. Additional post-procedure pain medication is provided for this. Discomfort is minimized if ice and heat are applied as  instructed.  Call if: You experience numbness and weakness that gets worse with time, as opposed to wearing off. He experience any unusual bleeding, difficulty breathing, or loss of the ability to control your bowel and bladder. (This applies to Spinal procedures only) You experience any redness, swelling, heat, red streaks, elevated temperature, fever, or any other signs of a possible infection.  Emergency Numbers: [place] hours (Monday - Thursday, 8:00 AM - 4:00 PM) (Friday, 9:00 AM - 12:00 Noon): [phone] After hours: [phone] ____________________________________________________________________________________________    ____________________________________________________________________________________________  Patient Information update  To: All of our patients.  Re: Name change.  It has been made official that our current name, "[facility]"   will soon be changed to "[facility]".   The purpose of this change is to eliminate any confusion created by the concept of our practice being a "Medication Management Pain [facility]". In the past this has led to the misconception that we treat pain primarily by the use of prescription medications.  Nothing can be farther from the truth.   Understanding PAIN MANAGEMENT: To further understand what our practice does, you first have to understand that "Pain Management" is a subspecialty that requires additional training once a physician has completed their specialty training, which can be in either Anesthesia, Neurology, Psychiatry, or Physical Medicine and Rehabilitation (PMR). Each one of these contributes to the final approach taken by each physician to the management of their patient's pain. To be a "Pain Management Specialist" you must have first completed one of the specialty trainings below.  Anesthesiologists -  trained in clinical pharmacology and interventional techniques such as nerve blockade and regional as well as central neuroanatomy. They are trained to block pain before, during, and after surgical interventions.  Neurologists - trained in the diagnosis and pharmacological treatment of complex neurological conditions, such as Multiple Sclerosis, Parkinson's, spinal cord injuries, and other systemic conditions that may be associated with symptoms that may include but are not limited to pain. They tend to rely primarily on the treatment of chronic pain using prescription medications.  Psychiatrist - trained in conditions affecting the psychosocial wellbeing of patients including but not limited to depression, anxiety, schizophrenia, personality disorders, addiction, and other substance use disorders that may be associated with chronic pain. They tend to rely primarily on the treatment of chronic pain using prescription medications.   Physical Medicine and Rehabilitation (PMR) physicians, also known as physiatrists - trained to treat a wide variety of medical conditions affecting the brain, spinal cord, nerves, bones, joints, ligaments, muscles, and tendons. Their training is primarily aimed at treating patients that have suffered injuries that have caused severe physical impairment. Their training is primarily aimed at the physical therapy and rehabilitation of those patients. They may also work alongside orthopedic surgeons or neurosurgeons using their expertise in assisting surgical patients to recover after their surgeries.  INTERVENTIONAL PAIN MANAGEMENT is sub-subspecialty of Pain Management.  Our physicians are Board-certified in Anesthesia, Pain Management, and Interventional Pain Management.  This meaning that not only have they been trained and Board-certified in their specialty of Anesthesia, and subspecialty of Pain Management, but they have also received further training in the sub-subspecialty of  Interventional Pain Management, in order to become Board-certified as INTERVENTIONAL PAIN MANAGEMENT SPECIALIST.  Mission: Our goal is to use our skills in  [place] as alternatives to the chronic use of prescription opioid medications for the treatment of pain. To make this more clear, we have changed our name to reflect what we do and offer. We will continue to offer medication management assessment and recommendations, but we will not be taking over any patient's medication management.  ____________________________________________________________________________________________

## 2022-07-11 ENCOUNTER — Telehealth: Payer: Self-pay

## 2022-07-11 NOTE — Telephone Encounter (Signed)
Post procedure follow up.  Patient states she is doing wonderful.

## 2022-07-16 ENCOUNTER — Ambulatory Visit (INDEPENDENT_AMBULATORY_CARE_PROVIDER_SITE_OTHER): Payer: Medicare Other

## 2022-07-16 VITALS — Ht 62.0 in | Wt 144.0 lb

## 2022-07-16 DIAGNOSIS — Z0000 Encounter for general adult medical examination without abnormal findings: Secondary | ICD-10-CM

## 2022-07-16 NOTE — Patient Instructions (Signed)
Ashley Rodriguez , Thank you for taking time to come for your Medicare Wellness Visit. I appreciate your ongoing commitment to your health goals. Please review the following plan we discussed and let me know if I can assist you in the future.   These are the goals we discussed:  Goals      DIET - EAT MORE FRUITS AND VEGETABLES     DIET - REDUCE SUGAR INTAKE     Recommend to monitor and cut back on sugar intake in daily diet.         This is a list of the screening recommended for you and due dates:  Health Maintenance  Topic Date Due   COVID-19 Vaccine (4 - 2023-24 season) 12/29/2021   Yearly kidney health urinalysis for diabetes  07/05/2022   Hemoglobin A1C  09/05/2022   Complete foot exam   03/07/2023   Yearly kidney function blood test for diabetes  03/08/2023   Eye exam for diabetics  06/22/2023   Medicare Annual Wellness Visit  07/16/2023   DTaP/Tdap/Td vaccine (3 - Td or Tdap) 09/09/2023   DEXA scan (bone density measurement)  11/11/2023   Colon Cancer Screening  02/27/2024   Mammogram  03/27/2024   Pneumonia Vaccine  Completed   Flu Shot  Completed   Hepatitis C Screening: USPSTF Recommendation to screen - Ages 18-79 yo.  Completed   Zoster (Shingles) Vaccine  Completed   HPV Vaccine  Aged Out    Advanced directives: no  Conditions/risks identified: low falls risk  Next appointment: Follow up in one year for your annual wellness visit 07/17/2023 @2 :30pm telephone   Preventive Care 65 Years and Older, Female Preventive care refers to lifestyle choices and visits with your health care provider that can promote health and wellness. What does preventive care include? A yearly physical exam. This is also called an annual well check. Dental exams once or twice a year. Routine eye exams. Ask your health care provider how often you should have your eyes checked. Personal lifestyle choices, including: Daily care of your teeth and gums. Regular physical activity. Eating a  healthy diet. Avoiding tobacco and drug use. Limiting alcohol use. Practicing safe sex. Taking low-dose aspirin every day. Taking vitamin and mineral supplements as recommended by your health care provider. What happens during an annual well check? The services and screenings done by your health care provider during your annual well check will depend on your age, overall health, lifestyle risk factors, and family history of disease. Counseling  Your health care provider may ask you questions about your: Alcohol use. Tobacco use. Drug use. Emotional well-being. Home and relationship well-being. Sexual activity. Eating habits. History of falls. Memory and ability to understand (cognition). Work and work Statistician. Reproductive health. Screening  You may have the following tests or measurements: Height, weight, and BMI. Blood pressure. Lipid and cholesterol levels. These may be checked every 5 years, or more frequently if you are over 67 years old. Skin check. Lung cancer screening. You may have this screening every year starting at age 42 if you have a 30-pack-year history of smoking and currently smoke or have quit within the past 15 years. Fecal occult blood test (FOBT) of the stool. You may have this test every year starting at age 56. Flexible sigmoidoscopy or colonoscopy. You may have a sigmoidoscopy every 5 years or a colonoscopy every 10 years starting at age 69. Hepatitis C blood test. Hepatitis B blood test. Sexually transmitted disease (STD) testing. Diabetes  screening. This is done by checking your blood sugar (glucose) after you have not eaten for a while (fasting). You may have this done every 1-3 years. Bone density scan. This is done to screen for osteoporosis. You may have this done starting at age 85. Mammogram. This may be done every 1-2 years. Talk to your health care provider about how often you should have regular mammograms. Talk with your health care  provider about your test results, treatment options, and if necessary, the need for more tests. Vaccines  Your health care provider may recommend certain vaccines, such as: Influenza vaccine. This is recommended every year. Tetanus, diphtheria, and acellular pertussis (Tdap, Td) vaccine. You may need a Td booster every 10 years. Zoster vaccine. You may need this after age 50. Pneumococcal 13-valent conjugate (PCV13) vaccine. One dose is recommended after age 40. Pneumococcal polysaccharide (PPSV23) vaccine. One dose is recommended after age 67. Talk to your health care provider about which screenings and vaccines you need and how often you need them. This information is not intended to replace advice given to you by your health care provider. Make sure you discuss any questions you have with your health care provider. Document Released: 05/13/2015 Document Revised: 01/04/2016 Document Reviewed: 02/15/2015 Elsevier Interactive Patient Education  2017 [place] Prevention in the Home Falls can cause injuries. They can happen to people of all ages. There are many things you can do to make your home safe and to help prevent falls. What can I do on the outside of my home? Regularly fix the edges of walkways and driveways and fix any cracks. Remove anything that might make you trip as you walk through a door, such as a raised step or threshold. Trim any bushes or trees on the path to your home. Use bright outdoor lighting. Clear any walking paths of anything that might make someone trip, such as rocks or tools. Regularly check to see if handrails are loose or broken. Make sure that both sides of any steps have handrails. Any raised decks and porches should have guardrails on the edges. Have any leaves, snow, or ice cleared regularly. Use sand or salt on walking paths during winter. Clean up any spills in your garage right away. This includes oil or grease spills. What can I do in the  bathroom? Use night lights. Install grab bars by the toilet and in the tub and shower. Do not use towel bars as grab bars. Use non-skid mats or decals in the tub or shower. If you need to sit down in the shower, use a plastic, non-slip stool. Keep the floor dry. Clean up any water that spills on the floor as soon as it happens. Remove soap buildup in the tub or shower regularly. Attach bath mats securely with double-sided non-slip rug tape. Do not have throw rugs and other things on the floor that can make you trip. What can I do in the bedroom? Use night lights. Make sure that you have a light by your bed that is easy to reach. Do not use any sheets or blankets that are too big for your bed. They should not hang down onto the floor. Have a firm chair that has side arms. You can use this for support while you get dressed. Do not have throw rugs and other things on the floor that can make you trip. What can I do in the kitchen? Clean up any spills right away. Avoid walking on wet floors. Keep  items that you use a lot in easy-to-reach places. If you need to reach something above you, use a strong step stool that has a grab bar. Keep electrical cords out of the way. Do not use floor polish or wax that makes floors slippery. If you must use wax, use non-skid floor wax. Do not have throw rugs and other things on the floor that can make you trip. What can I do with my stairs? Do not leave any items on the stairs. Make sure that there are handrails on both sides of the stairs and use them. Fix handrails that are broken or loose. Make sure that handrails are as long as the stairways. Check any carpeting to make sure that it is firmly attached to the stairs. Fix any carpet that is loose or worn. Avoid having throw rugs at the top or bottom of the stairs. If you do have throw rugs, attach them to the floor with carpet tape. Make sure that you have a light switch at the top of the stairs and the  bottom of the stairs. If you do not have them, ask someone to add them for you. What else can I do to help prevent falls? Wear shoes that: Do not have high heels. Have rubber bottoms. Are comfortable and fit you well. Are closed at the toe. Do not wear sandals. If you use a stepladder: Make sure that it is fully opened. Do not climb a closed stepladder. Make sure that both sides of the stepladder are locked into place. Ask someone to hold it for you, if possible. Clearly mark and make sure that you can see: Any grab bars or handrails. First and last steps. Where the edge of each step is. Use tools that help you move around (mobility aids) if they are needed. These include: Canes. Walkers. Scooters. Crutches. Turn on the lights when you go into a dark area. Replace any light bulbs as soon as they burn out. Set up your furniture so you have a clear path. Avoid moving your furniture around. If any of your floors are uneven, fix them. If there are any pets around you, be aware of where they are. Review your medicines with your doctor. Some medicines can make you feel dizzy. This can increase your chance of falling. Ask your doctor what other things that you can do to help prevent falls. This information is not intended to replace advice given to you by your health care provider. Make sure you discuss any questions you have with your health care provider. Document Released: 02/10/2009 Document Revised: 09/22/2015 Document Reviewed: 05/21/2014 Elsevier Interactive Patient Education  2017 [employer].

## 2022-07-16 NOTE — Progress Notes (Addendum)
I connected with  [name] on 07/16/22 by a audio enabled telemedicine application and verified that I am speaking with the correct person using two identifiers.  Patient Location: Home  Provider Location: Office/Clinic  I discussed the limitations of evaluation and management by telemedicine. The patient expressed understanding and agreed to proceed.  Subjective:   Maxine Stellwagen is a 72 y.o. female who presents for Medicare Annual (Subsequent) preventive examination.  Review of Systems    Cardiac Risk Factors include: advanced age (>16men, >68 women);dyslipidemia;hypertension;diabetes mellitus    Objective:    Today's Vitals   07/16/22 1536 07/16/22 1545  Weight:  144 lb (65.3 kg)  Height:  5\' 2"  (1.575 m)  PainSc: 0-No pain    Body mass index is 26.34 kg/m.     07/16/2022    3:55 PM 07/10/2022    9:26 AM 04/10/2022   11:40 AM 04/02/2022    9:48 AM 07/10/2021    9:44 AM 07/04/2020    9:56 AM 03/17/2020    9:57 AM  Advanced Directives  Does Patient Have a Medical Advance Directive? No No No No No No No  Would patient like information on creating a medical advance directive?   No - Patient declined No - Patient declined No - Patient declined No - Patient declined    Current Medications (verified) Outpatient Encounter Medications as of 07/16/2022  Medication Sig   acetaminophen (TYLENOL) 325 MG tablet Take 325 mg by mouth every 6 (six) hours as needed.   amLODipine (NORVASC) 5 MG tablet TAKE 1 TABLET BY MOUTH ONCE DAILY AS NEEDED FOR BLOOD PRESSURE   atenolol (TENORMIN) 50 MG tablet TAKE 1 TABLET BY MOUTH ONCE DAILY   benzonatate (TESSALON) 100 MG capsule Take 1 capsule (100 mg total) by mouth 2 (two) times daily as needed for cough.   betamethasone dipropionate 0.05 % lotion Apply 1 application topically as needed.    calcium carbonate (OS-CAL) 600 MG TABS tablet Take 1,200 mg by mouth daily with breakfast.   co-enzyme Q-10 30 MG capsule Take 100 mg by mouth daily.    ENBREL SURECLICK 50 MG/ML injection Inject 50 mg into the skin once a week.    esomeprazole (NEXIUM) 40 MG capsule TAKE 1 CAPSULE BY MOUTH ONCE DAILY   ezetimibe (ZETIA) 10 MG tablet Take 1 tablet (10 mg total) by mouth 2 (two) times a week.   fexofenadine (ALLEGRA) 180 MG tablet Take 180 mg by mouth daily.   gabapentin (NEURONTIN) 300 MG capsule TAKE 2 CAPSULES BY MOUTH EVERY MORNING AND 3 CAPSULES BY MOUTH EVERY NIGHT AT BEDTIME   HYDROcodone-acetaminophen (NORCO/VICODIN) 5-325 MG tablet Take 1 tablet by mouth every 8 (eight) hours as needed for up to 7 days for severe pain. Must last 7 days.   [START ON 07/17/2022] HYDROcodone-acetaminophen (NORCO/VICODIN) 5-325 MG tablet Take 1 tablet by mouth every 8 (eight) hours as needed for up to 7 days for severe pain. Must last for 7 days.   Insulin Pen Needle (NOVOFINE PLUS PEN NEEDLE) 32G X 4 MM MISC To use with semaglutide weekly injection   losartan (COZAAR) 25 MG tablet TAKE 1/2 TABLET (12.5 MG TOTAL) BY MOUTHONCE DAILY   montelukast (SINGULAIR) 10 MG tablet TAKE 1 TABLET BY MOUTH AT BEDTIME   Multiple Vitamins-Minerals (MULTIVITAMIN WITH MINERALS) tablet Take 1 tablet by mouth daily.   ondansetron (ZOFRAN-ODT) 4 MG disintegrating tablet Take 1 tablet (4 mg total) by mouth every 8 (eight) hours as needed for nausea or vomiting.  Semaglutide,0.25 or 0.5MG /DOS, (OZEMPIC, 0.25 OR 0.5 MG/DOSE,) 2 MG/3ML SOPN Inject 0.25 mg into the skin once a week.   TURMERIC PO Take by mouth daily at 6 (six) AM.   valACYclovir (VALTREX) 1000 MG tablet TAKE 2 TABLETS BY MOUTH TWICE DAILY FOR 1 DAY WHEN FEVER BLISTER OCCURS   Facility-Administered Encounter Medications as of 07/16/2022  Medication   triamcinolone acetonide (KENALOG-40) injection 60 mg   Allergies (verified) Atorvastatin, Sulfamethoxazole-trimethoprim, and Codeine   History: Past Medical History:  Diagnosis Date   COVID-19 12/15/2018   Flank lipoma 08/17/2016   Hypertension    Neck mass  04/07/2013   Psoriatic arthritis ([place])    Recurrent cellulitis    T2DM (type 2 diabetes mellitus) ([place]) 10/01/2018   Past Surgical History:  Procedure Laterality Date   COLONOSCOPY  04/30/2013   lipoma removed  04/30/2012   on neck   NECK MASS EXCISION  04/28/2014   lipoma   SUPRACERVICAL ABDOMINAL HYSTERECTOMY  04/30/1998   with BSO   Family History  Problem Relation Age of Onset   Hypertension Mother    Atrial fibrillation Mother    Cancer Father        bladder   Atrial fibrillation Brother    Supraventricular tachycardia Brother    Ovarian cancer Paternal Aunt        8s   Arrhythmia Nephew    Breast cancer Neg Hx    Colon cancer Neg Hx    Social History   Socioeconomic History   Marital status: Significant Other    Spouse name: Not on file   Number of children: 1   Years of education: Not on file   Highest education level: Some college, no degree  Occupational History   Occupation: Marine scientist    Comment: retired  Tobacco Use   Smoking status: Never   Smokeless tobacco: Never  Vaping Use   Vaping Use: Never used  Substance and Sexual Activity   Alcohol use: Yes    Alcohol/week: 0.0 - 4.0 standard drinks of alcohol    Comment: no more han 2 drinks at a time   Drug use: No   Sexual activity: Yes  Other Topics Concern   Not on file  Social History Narrative   Not on file   Social Determinants of Health   Financial Resource Strain: Low Risk  (07/16/2022)   Overall Financial Resource Strain (CARDIA)    Difficulty of Paying Living Expenses: Not hard at all  Food Insecurity: No Food Insecurity (07/16/2022)   Hunger Vital Sign    Worried About Running Out of Food in the Last Year: Never true    Ran Out of Food in the Last Year: Never true  Transportation Needs: No Transportation Needs (07/16/2022)   PRAPARE - Hydrologist (Medical): No    Lack of Transportation (Non-Medical): No  Physical Activity: Sufficiently  Active (07/16/2022)   Exercise Vital Sign    Days of Exercise per Week: 3 days    Minutes of Exercise per Session: 60 min  Stress: No Stress Concern Present (07/16/2022)   [place]    Feeling of Stress : Only a little  Social Connections: Moderately Integrated (07/16/2022)   Social Connection and Isolation Panel [NHANES]    Frequency of Communication with Friends and Family: More than three times a week    Frequency of Social Gatherings with Friends and Family: More than three times  a week    Attends Religious Services: More than 4 times per year    Active Member of Clubs or Organizations: No    Attends Archivist Meetings: Never    Marital Status: Living with partner    Tobacco Counseling Counseling given: Not Answered   Clinical Intake:  Pre-visit preparation completed: Yes  Pain : No/denies pain Pain Score: 0-No pain     BMI - recorded: 26.34 Nutritional Status: BMI 25 -29 Overweight Nutritional Risks: None Diabetes: Yes CBG done?: No Did pt. bring in CBG monitor from home?: No  How often do you need to have someone help you when you read instructions, pamphlets, or other written materials from your doctor or pharmacy?: 1 - Never  Diabetic?yes  Interpreter Needed?: No  Comments: lives w/partner Information entered by :: B.Alayja Armas,LPN   Activities of Daily Living    07/16/2022    3:43 PM 06/07/2022    1:08 PM  In your present state of health, do you have any difficulty performing the following activities:  Hearing? 1 1  Vision? 0 0  Difficulty concentrating or making decisions? 0 0  Walking or climbing stairs? 0 0  Dressing or bathing? 0 0  Doing errands, shopping? 0 0  Preparing Food and eating ? N   Using the Toilet? N   In the past six months, have you accidently leaked urine? N   Do you have problems with loss of bowel control? N   Managing your Medications? N   Managing your  Finances? N   Housekeeping or managing your Housekeeping? N     Patient Care Team: Virginia Crews, MD as PCP - General (Family Medicine) Anell Barr, [place] (Optometry) Milinda Pointer, MD as Referring Physician (Pain Medicine)  Indicate any recent Medical Services you may have received from other than [facility] providers in the past year (date may be approximate).     Assessment:   This is a routine wellness examination for Kathreen.  Hearing/Vision screen Hearing Screening - Comments:: Hearing slighted but hears ok Vision Screening - Comments:: Adequate vision w/glasses Dr Ellin Mayhew  Dietary issues and exercise activities discussed: Current Exercise Habits: Structured exercise class, Type of exercise: walking;stretching, Time (Minutes): 60, Frequency (Times/Week): 3, Weekly Exercise (Minutes/Week): 180, Intensity: Mild, Exercise limited by: orthopedic condition(s)   Goals Addressed             This Visit's Progress    DIET - EAT MORE FRUITS AND VEGETABLES   On track    DIET - REDUCE SUGAR INTAKE   On track    Recommend to monitor and cut back on sugar intake in daily diet.        Depression Screen    07/16/2022    3:50 PM 07/10/2022    9:25 AM 06/07/2022    1:07 PM 04/02/2022    9:48 AM 03/06/2022   10:46 AM 07/10/2021    9:43 AM 07/04/2021   10:42 AM  PHQ 2/9 Scores  PHQ - 2 Score 0 0 0 0 0 0 0  PHQ- 9 Score 4  4  9  0 4    Fall Risk    07/16/2022    3:47 PM 07/10/2022    9:25 AM 06/07/2022    1:07 PM 05/17/2022   10:43 AM 05/01/2022   10:28 AM  [place] in the past year? 0 0 1 1 0  Number falls in past yr: 0  0 0   Injury with  Fall? 0  1 0   Risk for fall due to : History of fall(s);Orthopedic patient  History of fall(s) No Fall Risks   Follow up Education provided;Falls prevention discussed   Falls evaluation completed     FALL RISK PREVENTION PERTAINING TO THE HOME:  Any stairs in or around the home? Yes  If so, are there any without handrails? Yes   Home free of loose throw rugs in walkways, pet beds, electrical cords, etc? No  Adequate lighting in your home to reduce risk of falls? No   ASSISTIVE DEVICES UTILIZED TO PREVENT FALLS:  Life alert? No  Use of a cane, walker or w/c? No  Grab bars in the bathroom? No  Shower chair or bench in shower? No  Elevated toilet seat or a handicapped toilet? No   Cognitive Function:        07/16/2022    3:57 PM 07/02/2019    9:07 AM  6CIT Screen  What Year? 0 points 0 points  What month? 0 points 0 points  What time? 0 points 0 points  Count back from 20 0 points 0 points  Months in reverse 0 points 0 points  Repeat phrase 0 points 0 points  Total Score 0 points 0 points    Immunizations Immunization History  Administered Date(s) Administered   Fluad Quad(high Dose 65+) 01/01/2019, 01/25/2020   Influenza-Unspecified 01/13/2021, 01/15/2022   PFIZER(Purple Top)SARS-COV-2 Vaccination 06/15/2019, 07/06/2019, 02/15/2020   Pneumococcal Conjugate-13 09/20/2014   Pneumococcal Polysaccharide-23 01/09/2012, 03/13/2018   Td 09/08/2013   Tdap 12/31/2005   Zoster Recombinat (Shingrix) 01/13/2021, 04/25/2021    TDAP status: Up to date  Flu Vaccine status: Up to date  Pneumococcal vaccine status: Up to date  Covid-19 vaccine status: Completed vaccines  Qualifies for Shingles Vaccine? Yes   Zostavax completed Yes   Shingrix Completed?: No.    Education has been provided regarding the importance of this vaccine. Patient has been advised to call insurance company to determine out of pocket expense if they have not yet received this vaccine. Advised may also receive vaccine at local pharmacy or Health Dept. Verbalized acceptance and understanding.  Screening Tests Health Maintenance  Topic Date Due   COVID-19 Vaccine (4 - 2023-24 season) 12/29/2021   Diabetic kidney evaluation - Urine ACR  07/05/2022   HEMOGLOBIN A1C  09/05/2022   FOOT EXAM  03/07/2023   Diabetic kidney evaluation -  eGFR measurement  03/08/2023   OPHTHALMOLOGY EXAM  06/22/2023   Medicare Annual Wellness (AWV)  07/16/2023   DTaP/Tdap/Td (3 - Td or Tdap) 09/09/2023   DEXA SCAN  11/11/2023   COLONOSCOPY (Pts 45-93yrs Insurance coverage will need to be confirmed)  02/27/2024   MAMMOGRAM  03/27/2024   Pneumonia Vaccine 78+ Years old  Completed   INFLUENZA VACCINE  Completed   Hepatitis C Screening  Completed   Zoster Vaccines- Shingrix  Completed   HPV VACCINES  Aged Out    Health Maintenance  Health Maintenance Due  Topic Date Due   COVID-19 Vaccine (4 - 2023-24 season) 12/29/2021   Diabetic kidney evaluation - Urine ACR  07/05/2022    Colorectal cancer screening: Type of screening: Colonoscopy. Completed yes. Repeat every 10 years  Mammogram status: Completed yes. Repeat every year  Bone Density status: Completed yes. Results reflect: Bone density results: NORMAL. Repeat every 5 years.  Lung Cancer Screening: (Low Dose CT Chest recommended if Age 73-80 years, 30 pack-year currently smoking OR have quit w/in 15years.) does not qualify.  Lung Cancer Screening Referral: no  Additional Screening:  Hepatitis C Screening: does not qualify; Completed ye  Vision Screening: Recommended annual ophthalmology exams for early detection of glaucoma and other disorders of the eye. Is the patient up to date with their annual eye exam?  Yes  Who is the provider or what is the name of the office in which the patient attends annual eye exams? Dr Ellin Mayhew If pt is not established with a provider, would they like to be referred to a provider to establish care? No .   Dental Screening: Recommended annual dental exams for proper oral hygiene  Community Resource Referral / Chronic Care Management: CRR required this visit?  No   CCM required this visit?  No      Plan:     I have personally reviewed and noted the following in the patient's chart:   Medical and social history Use of alcohol, tobacco or  illicit drugs  Current medications and supplements including opioid prescriptions. Patient is not currently taking opioid prescriptions. Functional ability and status Nutritional status Physical activity Advanced directives List of other physicians Hospitalizations, surgeries, and ER visits in previous 12 months Vitals Screenings to include cognitive, depression, and falls Referrals and appointments  In addition, I have reviewed and discussed with patient certain preventive protocols, quality metrics, and best practice recommendations. A written personalized care plan for preventive services as well as general preventive health recommendations were provided to patient.     Roger Shelter, LPN   [id number]   Nurse Notes: pt in a hurry has appt at 4p. She does relay she passed out at home two times in January due to low BP. She reports no injuries from those incidents. Pt states she is doing alright and has no concerns or questions

## 2022-07-19 ENCOUNTER — Other Ambulatory Visit: Payer: Self-pay | Admitting: Podiatry

## 2022-07-19 ENCOUNTER — Other Ambulatory Visit: Payer: Self-pay | Admitting: Family Medicine

## 2022-07-19 NOTE — Telephone Encounter (Signed)
Unable to refill per protocol, Rx request is too soon. Last refill 04/25/22 for 90 and  1 refill.  Requested Prescriptions  Pending Prescriptions Disp Refills   atenolol (TENORMIN) 50 MG tablet [Pharmacy Med Name: ATENOLOL 50MG  TABLET] 90 tablet 1    Sig: TAKE ONE TABLET BY MOUTH ONCE DAILY     Cardiovascular: Beta Blockers 2 Failed - 07/19/2022 11:42 AM      Failed - Last BP in normal range    BP Readings from Last 1 Encounters:  07/10/22 (!) 157/88         Passed - Cr in normal range and within 360 days    Creatinine  Date Value Ref Range Status  04/16/2014 0.68 0.60 - 1.30 mg/dL Final   Creatinine, Ser  Date Value Ref Range Status  03/07/2022 0.66 0.57 - 1.00 mg/dL Final   Creatinine, POC  Date Value Ref Range Status  01/01/2019 NA\ mg/dL Final         Passed - Last Heart Rate in normal range    Pulse Readings from Last 1 Encounters:  07/10/22 70         Passed - Valid encounter within last 6 months    Recent Outpatient Visits           1 month ago Acute cough   [place], Dionne Bucy, MD   2 months ago Viral conjunctivitis   [place], Dionne Bucy, MD   4 months ago Encounter for annual physical exam   [place], Dionne Bucy, MD   1 year ago Acute cough   [place], [place], PA-C   1 year ago Hypertension associated with diabetes [facility])   [place], Dionne Bucy, MD       Future Appointments             In 1 month Bac[place], Dionne Bucy, MD [facility], [facility]

## 2022-07-19 NOTE — Telephone Encounter (Signed)
Requested Prescriptions  Pending Prescriptions Disp Refills   amLODipine (NORVASC) 5 MG tablet [Pharmacy Med Name: AMLODIPINE BESYLATE 5MG  TABLET] 90 tablet 0    Sig: TAKE ONE TABLET BY MOUTH ONCE DAILY AS NEEDED FOR BLOOD PRESSURE     Cardiovascular: Calcium Channel Blockers 2 Failed - 07/19/2022 10:52 AM      Failed - Last BP in normal range    BP Readings from Last 1 Encounters:  07/10/22 (!) 157/88         Passed - Last Heart Rate in normal range    Pulse Readings from Last 1 Encounters:  07/10/22 70         Passed - Valid encounter within last 6 months    Recent Outpatient Visits           1 month ago Acute cough   [place], Dionne Bucy, MD   2 months ago Viral conjunctivitis   [place], Dionne Bucy, MD   4 months ago Encounter for annual physical exam   [facility] [place], Dionne Bucy, MD   1 year ago Acute cough   [place], [place], PA-C   1 year ago Hypertension associated with diabetes [facility])   [place], Dionne Bucy, MD       Future Appointments             In 1 month Bacigalupo, Dionne Bucy, MD [facility], [facility]

## 2022-07-26 DIAGNOSIS — L905 Scar conditions and fibrosis of skin: Secondary | ICD-10-CM | POA: Diagnosis not present

## 2022-07-26 DIAGNOSIS — Z85828 Personal history of other malignant neoplasm of skin: Secondary | ICD-10-CM | POA: Diagnosis not present

## 2022-07-26 DIAGNOSIS — Z08 Encounter for follow-up examination after completed treatment for malignant neoplasm: Secondary | ICD-10-CM | POA: Diagnosis not present

## 2022-07-26 DIAGNOSIS — L578 Other skin changes due to chronic exposure to nonionizing radiation: Secondary | ICD-10-CM | POA: Diagnosis not present

## 2022-07-31 DIAGNOSIS — M5416 Radiculopathy, lumbar region: Secondary | ICD-10-CM | POA: Diagnosis not present

## 2022-07-31 DIAGNOSIS — M9903 Segmental and somatic dysfunction of lumbar region: Secondary | ICD-10-CM | POA: Diagnosis not present

## 2022-07-31 DIAGNOSIS — M9901 Segmental and somatic dysfunction of cervical region: Secondary | ICD-10-CM | POA: Diagnosis not present

## 2022-07-31 DIAGNOSIS — M542 Cervicalgia: Secondary | ICD-10-CM | POA: Diagnosis not present

## 2022-08-01 ENCOUNTER — Other Ambulatory Visit: Payer: Self-pay | Admitting: Family Medicine

## 2022-08-01 NOTE — Telephone Encounter (Signed)
Requested Prescriptions  Pending Prescriptions Disp Refills   losartan (COZAAR) 25 MG tablet [Pharmacy Med Name: LOSARTAN POTASSIUM 25MG  TABLET] 45 tablet 0    Sig: TAKE ONE HALF (1/2) TABLET (12.5 MG TOTAL) BY MOUTH ONCE DAILY     Cardiovascular:  Angiotensin Receptor Blockers Failed - 08/01/2022 10:31 AM      Failed - Last BP in normal range    BP Readings from Last 1 Encounters:  07/10/22 (!) 157/88         Passed - Cr in normal range and within 180 days    Creatinine  Date Value Ref Range Status  04/16/2014 0.68 0.60 - 1.30 mg/dL Final   Creatinine, Ser  Date Value Ref Range Status  03/07/2022 0.66 0.57 - 1.00 mg/dL Final   Creatinine, POC  Date Value Ref Range Status  01/01/2019 NA\ mg/dL Final         Passed - K in normal range and within 180 days    Potassium  Date Value Ref Range Status  03/07/2022 4.6 3.5 - 5.2 mmol/L Final         Passed - Patient is not pregnant      Passed - Valid encounter within last 6 months    Recent Outpatient Visits           1 month ago Acute cough   [place], Dionne Bucy, MD   2 months ago Viral conjunctivitis   [place], Dionne Bucy, MD   4 months ago Encounter for annual physical exam   [place], Dionne Bucy, MD   1 year ago Acute cough   [place], [place], PA-C   1 year ago Hypertension associated with diabetes [facility])   [place], Dionne Bucy, MD       Future Appointments             In 1 month Bacigalupo, Dionne Bucy, MD [facility], [facility]

## 2022-08-16 ENCOUNTER — Other Ambulatory Visit: Payer: Self-pay | Admitting: Family Medicine

## 2022-08-16 DIAGNOSIS — E1169 Type 2 diabetes mellitus with other specified complication: Secondary | ICD-10-CM

## 2022-08-21 ENCOUNTER — Telehealth: Payer: Medicare Other | Admitting: Pain Medicine

## 2022-08-27 DIAGNOSIS — L570 Actinic keratosis: Secondary | ICD-10-CM | POA: Diagnosis not present

## 2022-08-27 DIAGNOSIS — D485 Neoplasm of uncertain behavior of skin: Secondary | ICD-10-CM | POA: Diagnosis not present

## 2022-08-28 DIAGNOSIS — M542 Cervicalgia: Secondary | ICD-10-CM | POA: Diagnosis not present

## 2022-08-28 DIAGNOSIS — M5416 Radiculopathy, lumbar region: Secondary | ICD-10-CM | POA: Diagnosis not present

## 2022-08-28 DIAGNOSIS — M9901 Segmental and somatic dysfunction of cervical region: Secondary | ICD-10-CM | POA: Diagnosis not present

## 2022-08-28 DIAGNOSIS — M9903 Segmental and somatic dysfunction of lumbar region: Secondary | ICD-10-CM | POA: Diagnosis not present

## 2022-08-30 ENCOUNTER — Ambulatory Visit: Payer: Medicare Other | Attending: Pain Medicine | Admitting: Pain Medicine

## 2022-08-30 DIAGNOSIS — M5459 Other low back pain: Secondary | ICD-10-CM | POA: Diagnosis not present

## 2022-08-30 DIAGNOSIS — G8929 Other chronic pain: Secondary | ICD-10-CM | POA: Diagnosis not present

## 2022-08-30 DIAGNOSIS — M545 Low back pain, unspecified: Secondary | ICD-10-CM | POA: Diagnosis not present

## 2022-08-30 DIAGNOSIS — M4310 Spondylolisthesis, site unspecified: Secondary | ICD-10-CM | POA: Diagnosis not present

## 2022-08-30 DIAGNOSIS — M79604 Pain in right leg: Secondary | ICD-10-CM | POA: Diagnosis not present

## 2022-08-30 DIAGNOSIS — Z09 Encounter for follow-up examination after completed treatment for conditions other than malignant neoplasm: Secondary | ICD-10-CM

## 2022-08-30 DIAGNOSIS — M47816 Spondylosis without myelopathy or radiculopathy, lumbar region: Secondary | ICD-10-CM | POA: Diagnosis not present

## 2022-08-30 DIAGNOSIS — M79605 Pain in left leg: Secondary | ICD-10-CM

## 2022-08-30 NOTE — Progress Notes (Signed)
Patient: Ashley Rodriguez  Service Category: E/M  Provider: Oswaldo Done, MD  DOB: Sep 13, 1950  DOS: 08/30/2022  Location: Office  MRN: [id number]  Setting: Ambulatory outpatient  Referring Provider: Erasmo Downer, MD  Type: Established Patient  Specialty: Interventional Pain Management  PCP: Erasmo Downer, MD  Location: Remote location  Delivery: TeleHealth     Virtual Encounter - Pain Management PROVIDER NOTE: Information contained herein reflects review and annotations entered in association with encounter. Interpretation of such information and data should be left to medically-trained personnel. Information provided to patient can be located elsewhere in the medical record under "Patient Instructions". Document created using STT-dictation technology, any transcriptional errors that may result from process are unintentional.    Contact & Pharmacy Preferred: [phone] Home: [phone] (home) Mobile: [phone] (mobile) E-mail: [email or web address] [place], [name] - [address] [name] [address] Phone: [phone] Fax: [phone]  [facility] [id number] [place], [name] - [address] [name] [address] Phone: [phone] Fax: [phone]   Pre-screening  [name] offered "in-person" vs "virtual" encounter. She indicated preferring virtual for this encounter.   Reason COVID-19*  Social distancing based on CDC and AMA recommendations.   I contacted Vic Blackbird on 08/30/2022 via telephone.      I clearly identified myself as Oswaldo Done, MD. I verified that I was speaking with the correct person using two identifiers (Name: Ashley Rodriguez, and date of birth: 03-Aug-1950).  Consent I sought verbal advanced consent from Vic Blackbird for virtual visit interactions. I informed [name] of possible security and privacy concerns, risks, and limitations associated  with providing "not-in-person" medical evaluation and management services. I also informed [name] of the availability of "in-person" appointments. Finally, I informed her that there would be a charge for the virtual visit and that she could be  personally, fully or partially, financially responsible for it. Ms. Swinford expressed understanding and agreed to proceed.   Historic Elements   Ashley Rodriguez is a 72 y.o. year old, female patient evaluated today after our last contact on 07/10/2022. Ashley Rodriguez  has a past medical history of COVID-19 (12/15/2018), Flank lipoma (08/17/2016), Hypertension, Neck mass (04/07/2013), Psoriatic arthritis (HCC), Recurrent cellulitis, and T2DM (type 2 diabetes mellitus) (HCC) (10/01/2018). She also  has a past surgical history that includes Supracervical abdominal hysterectomy (04/30/1998); lipoma removed (04/30/2012); Colonoscopy (04/30/2013); and Neck mass excision (04/28/2014). Ashley Rodriguez has a current medication list which includes the following prescription(s): acetaminophen, amlodipine, atenolol, betamethasone dipropionate, calcium carbonate, co-enzyme q-10, enbrel sureclick, esomeprazole, ezetimibe, fexofenadine, gabapentin, novofine plus pen needle, losartan, montelukast, multivitamin with minerals, ozempic (0.25 or 0.5 mg/dose), turmeric, valacyclovir, benzonatate, and ondansetron, and the following Facility-Administered Medications: triamcinolone acetonide. She  reports that she has never smoked. She has never used smokeless tobacco. She reports current alcohol use. She reports that she does not use drugs. Ashley Rodriguez is allergic to atorvastatin, sulfamethoxazole-trimethoprim, and codeine.  BMI: Estimated body mass index is 26.34 kg/m as calculated from the following:   Height as of 07/16/22: 5\' 2"  (1.575 m).   Weight as of 07/16/22: 144 lb (65.3 kg). Last encounter: 05/01/2022. Last procedure: 07/10/2022.  HPI  Today, she is being contacted for a post-procedure  assessment.  The patient indicates having attained 100% relief of the pain for the duration of the local anesthetic which then has come down to an ongoing 75% improvement.  She is extremely happy with the  results and she refers that she is the best that she has been in years.  She is able to move better and have better range of motion.  She does indicate occasionally having some soreness in the morning, possibly related to arthritis, and occasionally if she walks for long distances, then she refers having some symptoms going down the leg however this is not the norm and for the time being she is extremely happy.  The patient has been instructed to give [name] a call when the benefits from the radiofrequency ablation were off so that we can reevaluate and see if we need to have it repeated.  She understood and accepted.  Post-procedure evaluation   Procedure: Lumbar Facet, Medial Branch Radiofrequency Ablation (RFA) #2  Laterality: Right (-RT)  Level: L2, L3, L4, L5, and S1 Medial Branch Level(s). These levels will denervate the L3-4, L4-5, and L5-S1 lumbar facet joints.  Imaging: Fluoroscopy-guided         Anesthesia: Local anesthesia (1-2% Lidocaine) Anxiolysis: IV Versed         Sedation: Moderate Sedation                       DOS: 07/10/2022  Performed by: Oswaldo Done, MD  Purpose: Therapeutic/Palliative Indications: Low back pain severe enough to impact quality of life or function. Indications: 1. Lumbar facet syndrome (Bilateral) (R>L)   2. Grade 1 Anterolisthesis of L5 over S1   3. L5 pars defect with spondylolisthesis (Bilateral)   4. Spondylosis without myelopathy or radiculopathy, lumbosacral region   5. DDD (degenerative disc disease), lumbosacral   6. Chronic low back pain (Bilateral) w/o sciatica    Pain Score: Pre-procedure: 5 /10 Post-procedure: 0-No pain/10  Note: Today patient has requested to hold and not send opioid analgesics for postprocedure discomfort.      Effectiveness:  Initial hour after procedure: 100 %. Subsequent 4-6 hours post-procedure: 100 %. Analgesia past initial 6 hours: 75 %. Ongoing improvement:  Analgesic: The patient indicates having an ongoing 75% improvement of her low back pain. Function: Ms. Ramires reports improvement in function ROM: Ms. Heaps reports improvement in ROM  Pharmacotherapy Assessment   Opioid Analgesic: No opioid analgesics from our practice.   Monitoring: [place] PMP: PDMP not reviewed this encounter.       Pharmacotherapy: No side-effects or adverse reactions reported. Compliance: No problems identified. Effectiveness: Clinically acceptable. Plan: Refer to "POC". UDS: No results found for: "SUMMARY" No results found for: "CBDTHCR", "D8THCCBX", "D9THCCBX"   Laboratory Chemistry Profile   Renal Lab Results  Component Value Date   BUN 11 03/07/2022   CREATININE 0.66 03/07/2022   BCR 17 03/07/2022   GFRAA 94 05/17/2020   GFRNONAA 81 05/17/2020    Hepatic Lab Results  Component Value Date   AST 36 03/07/2022   ALT 39 (H) 03/07/2022   ALBUMIN 4.9 03/07/2022   ALKPHOS 67 03/07/2022    Electrolytes Lab Results  Component Value Date   NA 140 03/07/2022   K 4.6 03/07/2022   CL 99 03/07/2022   CALCIUM 9.6 03/07/2022   MG 2.1 01/03/2017    Bone Lab Results  Component Value Date   VD25OH 47.6 03/07/2022   25OHVITD1 50 01/03/2017   25OHVITD2 <1.0 01/03/2017   25OHVITD3 50 01/03/2017    Inflammation (CRP: Acute Phase) (ESR: Chronic Phase) Lab Results  Component Value Date   CRP 0.8 01/03/2017   ESRSEDRATE 2 01/25/2020  Note: Above Lab results reviewed.  Imaging  DG PAIN CLINIC C-ARM 1-60 MIN NO REPORT Fluoro was used, but no Radiologist interpretation will be provided.  Please refer to "NOTES" tab for provider progress note.  Assessment  The primary encounter diagnosis was Chronic low back pain (Bilateral) w/o sciatica. Diagnoses of Lumbar facet joint pain, Lumbar facet  syndrome (Bilateral) (R>L), Low back pain radiating to both legs, Postop check, and Grade 1 Anterolisthesis of L5 over S1 were also pertinent to this visit.  Plan of Care  Problem-specific:  No problem-specific Assessment & Plan notes found for this encounter.  Ms. Bentlie Withem has a current medication list which includes the following long-term medication(s): amlodipine, atenolol, calcium carbonate, enbrel sureclick, esomeprazole, ezetimibe, fexofenadine, gabapentin, losartan, and montelukast.  Pharmacotherapy (Medications Ordered): No orders of the defined types were placed in this encounter.  Orders:  No orders of the defined types were placed in this encounter.  Follow-up plan:   Return if symptoms worsen or fail to improve.      Interventional Therapies  Risk Factors  Considerations:  BCBSNC DENIED L-MRI (02/17/19)    Planned  Pending:      Under consideration:      Completed:   Diagnostic/therapeutic right L4-5 LESI x1 (01/22/2019) (50/50/90 x2 weeks)  Therapeutic bilateral lumbar facet MBB x7 (04/10/2022) (100/100/95/90-100/duration of up to 8 months)  Therapeutic bilateral SI Blk x4 (11/09/2021) (100/100/80) (100/100/100/50)  Therapeutic right (L4-5, L5-S1) lumbar facet RFA x2 (07/10/2022) (100/100/75/75) (first 1 lasted 3 years)  Therapeutic left lumbar facet RFA x1 (08/06/2019)  (100/100/75)  Therapeutic right SI RFA x1 (03/17/2020) (100/100/100/90-100)  Therapeutic left SI RFA x1 (03/31/2020) (100/100/100 x3 weeks/70)    Completed by other providers:      Therapeutic  Palliative (PRN) options:   Palliative lumbar facet MBB  Therapeutic lumbar facet RFA  Therapeutic SI RFA  Palliative SI joint Blk  Therapeutic L4-5 LESI       Recent Visits Date Type Provider Dept  07/10/22 Procedure visit Delano Metz, MD [facility]  Showing recent visits within past 90 days and meeting all other requirements Today's Visits Date Type Provider Dept   08/30/22 Office Visit Delano Metz, MD [facility]  Showing today's visits and meeting all other requirements Future Appointments No visits were found meeting these conditions. Showing future appointments within next 90 days and meeting all other requirements  I discussed the assessment and treatment plan with the patient. The patient was provided an opportunity to ask questions and all were answered. The patient agreed with the plan and demonstrated an understanding of the instructions.  Patient advised to call back or seek an in-person evaluation if the symptoms or condition worsens.  Duration of encounter: 15 minutes.  Note by: Oswaldo Done, MD Date: 08/30/2022; Time: 12:27 PM

## 2022-08-31 ENCOUNTER — Other Ambulatory Visit: Payer: Self-pay | Admitting: Family Medicine

## 2022-09-05 DIAGNOSIS — J3489 Other specified disorders of nose and nasal sinuses: Secondary | ICD-10-CM | POA: Diagnosis not present

## 2022-09-07 ENCOUNTER — Ambulatory Visit: Payer: Medicare Other | Admitting: Family Medicine

## 2022-09-17 DIAGNOSIS — J3489 Other specified disorders of nose and nasal sinuses: Secondary | ICD-10-CM | POA: Diagnosis not present

## 2022-09-18 ENCOUNTER — Encounter: Payer: Self-pay | Admitting: Family Medicine

## 2022-09-18 ENCOUNTER — Ambulatory Visit (INDEPENDENT_AMBULATORY_CARE_PROVIDER_SITE_OTHER): Payer: Medicare Other | Admitting: Family Medicine

## 2022-09-18 VITALS — BP 114/72 | HR 69 | Temp 97.8°F | Resp 12 | Ht 62.0 in | Wt 145.4 lb

## 2022-09-18 DIAGNOSIS — L4050 Arthropathic psoriasis, unspecified: Secondary | ICD-10-CM | POA: Diagnosis not present

## 2022-09-18 DIAGNOSIS — T466X5A Adverse effect of antihyperlipidemic and antiarteriosclerotic drugs, initial encounter: Secondary | ICD-10-CM

## 2022-09-18 DIAGNOSIS — M5416 Radiculopathy, lumbar region: Secondary | ICD-10-CM | POA: Diagnosis not present

## 2022-09-18 DIAGNOSIS — E1169 Type 2 diabetes mellitus with other specified complication: Secondary | ICD-10-CM | POA: Diagnosis not present

## 2022-09-18 DIAGNOSIS — E038 Other specified hypothyroidism: Secondary | ICD-10-CM | POA: Insufficient documentation

## 2022-09-18 DIAGNOSIS — E1159 Type 2 diabetes mellitus with other circulatory complications: Secondary | ICD-10-CM

## 2022-09-18 DIAGNOSIS — I152 Hypertension secondary to endocrine disorders: Secondary | ICD-10-CM

## 2022-09-18 DIAGNOSIS — M9901 Segmental and somatic dysfunction of cervical region: Secondary | ICD-10-CM | POA: Diagnosis not present

## 2022-09-18 DIAGNOSIS — M791 Myalgia, unspecified site: Secondary | ICD-10-CM

## 2022-09-18 DIAGNOSIS — E785 Hyperlipidemia, unspecified: Secondary | ICD-10-CM | POA: Diagnosis not present

## 2022-09-18 DIAGNOSIS — M542 Cervicalgia: Secondary | ICD-10-CM | POA: Diagnosis not present

## 2022-09-18 DIAGNOSIS — M9903 Segmental and somatic dysfunction of lumbar region: Secondary | ICD-10-CM | POA: Diagnosis not present

## 2022-09-18 LAB — POCT GLYCOSYLATED HEMOGLOBIN (HGB A1C): Hemoglobin A1C: 5.8 % — AB (ref 4.0–5.6)

## 2022-09-18 MED ORDER — OZEMPIC (0.25 OR 0.5 MG/DOSE) 2 MG/3ML ~~LOC~~ SOPN: 0.5000 mg | PEN_INJECTOR | SUBCUTANEOUS | 3 refills | Status: DC

## 2022-09-18 NOTE — Assessment & Plan Note (Signed)
Continue to monitor TSH and free T4

## 2022-09-18 NOTE — Assessment & Plan Note (Signed)
Well controlled Continue current medications Recheck metabolic panel F/u in 6 months  

## 2022-09-18 NOTE — Assessment & Plan Note (Signed)
Continue Zetia and CoQ10 H/o statin induced myalgias Recheck FLP and CMP 

## 2022-09-18 NOTE — Assessment & Plan Note (Signed)
Followed by Rheum. 

## 2022-09-18 NOTE — Progress Notes (Signed)
I,Sulibeya S Dimas,acting as a Neurosurgeon for Shirlee Latch, MD.,have documented all relevant documentation on the behalf of Shirlee Latch, MD,as directed by  Shirlee Latch, MD while in the presence of Shirlee Latch, MD.     Established patient visit   Patient: Ashley Rodriguez   DOB: [birth year]   72 y.o. Female  MRN: [id number] Visit Date: 09/18/2022  Today's healthcare provider: Shirlee Latch, MD   Chief Complaint  Patient presents with   Diabetes   Subjective    HPI  Diabetes Mellitus Type II, Follow-up  Lab Results  Component Value Date   HGBA1C 6.2 (H) 03/07/2022   HGBA1C 5.9 (A) 07/04/2021   HGBA1C 5.8 (A) 03/17/2021   Wt Readings from Last 3 Encounters:  09/18/22 145 lb 6.4 oz (66 kg)  07/16/22 144 lb (65.3 kg)  07/10/22 144 lb (65.3 kg)   Last seen for diabetes 6 months ago.  Management since then includes no changes. She reports excellent compliance with treatment. She is not having side effects.    Home blood sugar records:  not being checked  Episodes of hypoglycemia? No    Current insulin regiment: none Most Recent Eye Exam: UTD   Pertinent Labs: Lab Results  Component Value Date   CHOL 205 (H) 03/07/2022   HDL 47 03/07/2022   LDLCALC 128 (H) 03/07/2022   TRIG 171 (H) 03/07/2022   CHOLHDL 3.9 03/17/2021   Lab Results  Component Value Date   NA 140 03/07/2022   K 4.6 03/07/2022   CREATININE 0.66 03/07/2022   EGFR 95 03/07/2022   LABMICR 19.9 07/04/2021   MICRALBCREAT 9 07/04/2021     ---------------------------------------------------------------------------------------------------   Medications: Outpatient Medications Prior to Visit  Medication Sig   acetaminophen (TYLENOL) 325 MG tablet Take 325 mg by mouth every 6 (six) hours as needed.   amLODipine (NORVASC) 5 MG tablet TAKE ONE TABLET BY MOUTH ONCE DAILY AS NEEDED FOR BLOOD PRESSURE   atenolol (TENORMIN) 50 MG tablet TAKE 1 TABLET BY MOUTH ONCE DAILY   benzonatate  (TESSALON) 100 MG capsule Take 1 capsule (100 mg total) by mouth 2 (two) times daily as needed for cough.   betamethasone dipropionate 0.05 % lotion Apply 1 application topically as needed.    calcium carbonate (OS-CAL) 600 MG TABS tablet Take 1,200 mg by mouth daily with breakfast.   co-enzyme Q-10 30 MG capsule Take 100 mg by mouth daily.   ENBREL SURECLICK 50 MG/ML injection Inject 50 mg into the skin once a week.    esomeprazole (NEXIUM) 40 MG capsule TAKE ONE CAPSULE BY MOUTH ONCE DAILY   ezetimibe (ZETIA) 10 MG tablet TAKE ONE TABLET BY MOUTH TWO TIMES A WEEK   fexofenadine (ALLEGRA) 180 MG tablet Take 180 mg by mouth daily.   gabapentin (NEURONTIN) 300 MG capsule TAKE TWO CAPSULES BY MOUTH EVERY MORNING AND THREE CAPSULES BY MOUTH EVERY NIGHT AT BEDTIME   Insulin Pen Needle (NOVOFINE PLUS PEN NEEDLE) 32G X 4 MM MISC To use with semaglutide weekly injection   losartan (COZAAR) 25 MG tablet TAKE ONE HALF (1/2) TABLET (12.5 MG TOTAL) BY MOUTH ONCE DAILY   montelukast (SINGULAIR) 10 MG tablet TAKE 1 TABLET BY MOUTH AT BEDTIME   Multiple Vitamins-Minerals (MULTIVITAMIN WITH MINERALS) tablet Take 1 tablet by mouth daily.   ondansetron (ZOFRAN-ODT) 4 MG disintegrating tablet Take 1 tablet (4 mg total) by mouth every 8 (eight) hours as needed for nausea or vomiting.   TURMERIC PO Take by mouth daily at 6 (  six) AM.   valACYclovir (VALTREX) 1000 MG tablet TAKE 2 TABLETS BY MOUTH TWICE DAILY FOR 1 DAY WHEN FEVER BLISTER OCCURS   [DISCONTINUED] Semaglutide,0.25 or 0.5MG /DOS, (OZEMPIC, 0.25 OR 0.5 MG/DOSE,) 2 MG/3ML SOPN Inject 0.25 mg into the skin once a week.   Facility-Administered Medications Prior to Visit  Medication Dose Route Frequency Provider   triamcinolone acetonide (KENALOG-40) injection 60 mg  60 mg Other Once [place], Max T, DPM    Review of Systems per HPI     Objective    BP 114/72 (BP Location: Left Arm, Patient Position: Sitting, Cuff Size: Normal)   Pulse 69   Temp 97.8 F  (36.6 C) (Temporal)   Resp 12   Ht 5\' 2"  (1.575 m)   Wt 145 lb 6.4 oz (66 kg)   SpO2 98%   BMI 26.59 kg/m    Physical Exam Vitals reviewed.  Constitutional:      General: She is not in acute distress.    Appearance: Normal appearance. She is well-developed. She is not diaphoretic.  HENT:     Head: Normocephalic and atraumatic.  Eyes:     General: No scleral icterus.    Conjunctiva/sclera: Conjunctivae normal.  Neck:     Thyroid: No thyromegaly.  Cardiovascular:     Rate and Rhythm: Normal rate and regular rhythm.     Pulses: Normal pulses.     Heart sounds: Normal heart sounds. No murmur heard. Pulmonary:     Effort: Pulmonary effort is normal. No respiratory distress.     Breath sounds: Normal breath sounds. No wheezing, rhonchi or rales.  Musculoskeletal:     Cervical back: Neck supple.     Right lower leg: No edema.     Left lower leg: No edema.  Lymphadenopathy:     Cervical: No cervical adenopathy.  Skin:    General: Skin is warm and dry.     Findings: No rash.  Neurological:     Mental Status: She is alert and oriented to person, place, and time. Mental status is at baseline.  Psychiatric:        Mood and Affect: Mood normal.        Behavior: Behavior normal.       No results found for any visits on 09/18/22.  Assessment & Plan     Problem List Items Addressed This Visit       Cardiovascular and Mediastinum   Hypertension associated with diabetes (HCC)    Well controlled Continue current medications Recheck metabolic panel F/u in 6 months       Relevant Medications   Semaglutide,0.25 or 0.5MG /DOS, (OZEMPIC, 0.25 OR 0.5 MG/DOSE,) 2 MG/3ML SOPN   Other Relevant Orders   Comprehensive metabolic panel     Endocrine   T2DM (type 2 diabetes mellitus) (HCC) - Primary    Chronic and well controlled Increase ozempic to 0.5 mg weekly for better weight management UTD on screenings - repeat UACR today       Relevant Medications   Semaglutide,0.25  or 0.5MG /DOS, (OZEMPIC, 0.25 OR 0.5 MG/DOSE,) 2 MG/3ML SOPN   Other Relevant Orders   POCT glycosylated hemoglobin (Hb A1C)   Urine Microalbumin w/creat. ratio   Hyperlipidemia associated with type 2 diabetes mellitus (HCC)    Continue Zetia and CoQ10 H/o statin induced myalgias Recheck FLP and CMP      Relevant Medications   Semaglutide,0.25 or 0.5MG /DOS, (OZEMPIC, 0.25 OR 0.5 MG/DOSE,) 2 MG/3ML SOPN   Other Relevant Orders   Lipid  panel   Comprehensive metabolic panel   Subclinical hypothyroidism    Continue to monitor TSH and free T4      Relevant Orders   TSH + free T4     Musculoskeletal and Integument   Psoriatic arthritis (HCC) (Chronic)    Followed by Rheum        Other   Myalgia due to statin    Statin intolerant        Return in about 6 months (around 03/21/2023) for CPE.      I, Shirlee Latch, MD, have reviewed all documentation for this visit. The documentation on 09/18/22 for the exam, diagnosis, procedures, and orders are all accurate and complete.   Trae Bovenzi, Marzella Schlein, MD, MPH [facility]

## 2022-09-18 NOTE — Assessment & Plan Note (Signed)
Statin intolerant 

## 2022-09-18 NOTE — Assessment & Plan Note (Signed)
Chronic and well controlled Increase ozempic to 0.5 mg weekly for better weight management UTD on screenings - repeat UACR today

## 2022-09-19 LAB — COMPREHENSIVE METABOLIC PANEL
ALT: 22 IU/L (ref 0–32)
AST: 26 IU/L (ref 0–40)
Albumin/Globulin Ratio: 1.9 (ref 1.2–2.2)
Albumin: 4.7 g/dL (ref 3.9–4.9)
Alkaline Phosphatase: 64 IU/L (ref 44–121)
BUN/Creatinine Ratio: 19 (ref 12–28)
BUN: 11 mg/dL (ref 8–27)
Bilirubin Total: 0.4 mg/dL (ref 0.0–1.2)
CO2: 27 mmol/L (ref 20–29)
Calcium: 9.9 mg/dL (ref 8.7–10.3)
Chloride: 98 mmol/L (ref 96–106)
Creatinine, Ser: 0.57 mg/dL (ref 0.57–1.00)
Globulin, Total: 2.5 g/dL (ref 1.5–4.5)
Glucose: 117 mg/dL — ABNORMAL HIGH (ref 70–99)
Potassium: 4.8 mmol/L (ref 3.5–5.2)
Sodium: 137 mmol/L (ref 134–144)
Total Protein: 7.2 g/dL (ref 6.0–8.5)
eGFR: 98 mL/min/{1.73_m2} (ref >59)

## 2022-09-19 LAB — LIPID PANEL
Chol/HDL Ratio: 4.1 ratio (ref 0.0–4.4)
Cholesterol, Total: 185 mg/dL (ref 100–199)
HDL: 45 mg/dL (ref >39)
LDL Chol Calc (NIH): 98 mg/dL (ref 0–99)
Triglycerides: 250 mg/dL — ABNORMAL HIGH (ref 0–149)
VLDL Cholesterol Cal: 42 mg/dL — ABNORMAL HIGH (ref 5–40)

## 2022-09-19 LAB — MICROALBUMIN / CREATININE URINE RATIO
Creatinine, Urine: 92 mg/dL
Microalb/Creat Ratio: 7 mg/g creat (ref 0–29)
Microalbumin, Urine: 6.6 ug/mL

## 2022-09-19 LAB — TSH+FREE T4
Free T4: 1.08 ng/dL (ref 0.82–1.77)
TSH: 1.56 u[IU]/mL (ref 0.450–4.500)

## 2022-10-02 DIAGNOSIS — J3489 Other specified disorders of nose and nasal sinuses: Secondary | ICD-10-CM | POA: Diagnosis not present

## 2022-10-04 DIAGNOSIS — L400 Psoriasis vulgaris: Secondary | ICD-10-CM | POA: Diagnosis not present

## 2022-10-04 DIAGNOSIS — L821 Other seborrheic keratosis: Secondary | ICD-10-CM | POA: Diagnosis not present

## 2022-10-04 DIAGNOSIS — L4050 Arthropathic psoriasis, unspecified: Secondary | ICD-10-CM | POA: Diagnosis not present

## 2022-10-04 DIAGNOSIS — Z1283 Encounter for screening for malignant neoplasm of skin: Secondary | ICD-10-CM | POA: Diagnosis not present

## 2022-10-04 DIAGNOSIS — D225 Melanocytic nevi of trunk: Secondary | ICD-10-CM | POA: Diagnosis not present

## 2022-10-04 DIAGNOSIS — L570 Actinic keratosis: Secondary | ICD-10-CM | POA: Diagnosis not present

## 2022-10-04 DIAGNOSIS — L812 Freckles: Secondary | ICD-10-CM | POA: Diagnosis not present

## 2022-10-04 DIAGNOSIS — Z85828 Personal history of other malignant neoplasm of skin: Secondary | ICD-10-CM | POA: Diagnosis not present

## 2022-10-04 DIAGNOSIS — R238 Other skin changes: Secondary | ICD-10-CM | POA: Diagnosis not present

## 2022-10-16 DIAGNOSIS — M542 Cervicalgia: Secondary | ICD-10-CM | POA: Diagnosis not present

## 2022-10-16 DIAGNOSIS — M9903 Segmental and somatic dysfunction of lumbar region: Secondary | ICD-10-CM | POA: Diagnosis not present

## 2022-10-16 DIAGNOSIS — M5416 Radiculopathy, lumbar region: Secondary | ICD-10-CM | POA: Diagnosis not present

## 2022-10-16 DIAGNOSIS — M9901 Segmental and somatic dysfunction of cervical region: Secondary | ICD-10-CM | POA: Diagnosis not present

## 2022-10-25 ENCOUNTER — Other Ambulatory Visit: Payer: Self-pay | Admitting: Family Medicine

## 2022-10-26 NOTE — Telephone Encounter (Signed)
Requested Prescriptions  Pending Prescriptions Disp Refills   atenolol (TENORMIN) 50 MG tablet [Pharmacy Med Name: ATENOLOL 50MG  TABLET] 90 tablet 1    Sig: TAKE ONE TABLET BY MOUTH ONCE DAILY     Cardiovascular: Beta Blockers 2 Passed - 10/25/2022  9:19 AM      Passed - Cr in normal range and within 360 days    Creatinine  Date Value Ref Range Status  04/16/2014 0.68 0.60 - 1.30 mg/dL Final   Creatinine, Ser  Date Value Ref Range Status  09/18/2022 0.57 0.57 - 1.00 mg/dL Final   Creatinine, POC  Date Value Ref Range Status  01/01/2019 NA\ mg/dL Final         Passed - Last BP in normal range    BP Readings from Last 1 Encounters:  09/18/22 114/72         Passed - Last Heart Rate in normal range    Pulse Readings from Last 1 Encounters:  09/18/22 69         Passed - Valid encounter within last 6 months    Recent Outpatient Visits           1 month ago Type 2 diabetes mellitus with other specified complication, without long-term current use of insulin (HCC)   [place] [facility] [place], Marzella Schlein, MD   4 months ago Acute cough   [place] [facility] [place], Marzella Schlein, MD   5 months ago Viral conjunctivitis   [place] [facility] [place], Marzella Schlein, MD   7 months ago Encounter for annual physical exam   [facility] [place], Marzella Schlein, MD   1 year ago Acute cough   [place] [facility] [place], [place], PA-C       Future Appointments             In 4 months Bacigalupo, Marzella Schlein, MD [facility]

## 2022-11-02 ENCOUNTER — Other Ambulatory Visit: Payer: Self-pay | Admitting: Pharmacist

## 2022-11-02 NOTE — Progress Notes (Unsigned)
   11/02/2022  Patient ID: Ashley Rodriguez, female   DOB: 06-01-1950, 72 y.o.   MRN: [id number]  Pharmacy Quality Measure Review  This patient is appearing on a report related to adherence measure for diabetes medications this calendar year.   Medication: Ozempic 0.5 mg weekly Last fill date: 09/18/2022 for 28 day supply  Was unable to reach patient via telephone today and have left HIPAA compliant voicemail asking patient to return my call.   Estelle Grumbles, PharmD, [facility] [phone]

## 2022-11-06 ENCOUNTER — Ambulatory Visit: Payer: Self-pay

## 2022-11-06 DIAGNOSIS — H6122 Impacted cerumen, left ear: Secondary | ICD-10-CM | POA: Diagnosis not present

## 2022-11-06 DIAGNOSIS — J301 Allergic rhinitis due to pollen: Secondary | ICD-10-CM | POA: Diagnosis not present

## 2022-11-06 DIAGNOSIS — J3489 Other specified disorders of nose and nasal sinuses: Secondary | ICD-10-CM | POA: Diagnosis not present

## 2022-11-06 NOTE — Patient Outreach (Signed)
  Care Coordination   Initial Visit Note   11/06/2022 Name: Kimblery Teoh MRN: [id number] DOB: 08/02/1950  Eshal Hoberman is a 72 y.o. year old female who sees Bacigalupo, Marzella Schlein, MD for primary care. I spoke with  Vic Blackbird by phone today.  What matters to the patients health and wellness today?  CM educated patient on [facility] services.  Patient declines services and feels that she is able to manage her medical needs.  Patient agreed to contact her provider in the future if she needs [facility] services.    Goals Addressed   None     SDOH assessments and interventions completed:  No     Care Coordination Interventions:  No, not indicated   Follow up plan: No further intervention required.   Encounter Outcome:  Pt. Refused

## 2022-11-13 DIAGNOSIS — M9903 Segmental and somatic dysfunction of lumbar region: Secondary | ICD-10-CM | POA: Diagnosis not present

## 2022-11-13 DIAGNOSIS — M542 Cervicalgia: Secondary | ICD-10-CM | POA: Diagnosis not present

## 2022-11-13 DIAGNOSIS — M9901 Segmental and somatic dysfunction of cervical region: Secondary | ICD-10-CM | POA: Diagnosis not present

## 2022-11-13 DIAGNOSIS — M5416 Radiculopathy, lumbar region: Secondary | ICD-10-CM | POA: Diagnosis not present

## 2022-11-15 ENCOUNTER — Other Ambulatory Visit: Payer: Self-pay | Admitting: Family Medicine

## 2022-11-16 NOTE — Telephone Encounter (Signed)
Requested Prescriptions  Pending Prescriptions Disp Refills   losartan (COZAAR) 25 MG tablet [Pharmacy Med Name: LOSARTAN POTASSIUM 25MG  TABLET] 45 tablet 1    Sig: TAKE ONE HALF (1/2) TABLET (12.5 MG TOTAL) BY MOUTH ONCE DAILY     Cardiovascular:  Angiotensin Receptor Blockers Passed - 11/15/2022  9:09 AM      Passed - Cr in normal range and within 180 days    Creatinine  Date Value Ref Range Status  04/16/2014 0.68 0.60 - 1.30 mg/dL Final   Creatinine, Ser  Date Value Ref Range Status  09/18/2022 0.57 0.57 - 1.00 mg/dL Final   Creatinine, POC  Date Value Ref Range Status  01/01/2019 NA\ mg/dL Final         Passed - K in normal range and within 180 days    Potassium  Date Value Ref Range Status  09/18/2022 4.8 3.5 - 5.2 mmol/L Final         Passed - Patient is not pregnant      Passed - Last BP in normal range    BP Readings from Last 1 Encounters:  09/18/22 114/72         Passed - Valid encounter within last 6 months    Recent Outpatient Visits           1 month ago Type 2 diabetes mellitus with other specified complication, without long-term current use of insulin (HCC)   [place] [facility] [place], Marzella Schlein, MD   5 months ago Acute cough   [place] [facility] Erasmo Downer, MD   6 months ago Viral conjunctivitis   [place] [facility] [place], Marzella Schlein, MD   8 months ago Encounter for annual physical exam   [facility] [place], Marzella Schlein, MD   1 year ago Acute cough   [place] [facility] [place], [place], PA-C       Future Appointments             In 4 months Bacigalupo, Marzella Schlein, MD [facility]

## 2022-11-26 DIAGNOSIS — L409 Psoriasis, unspecified: Secondary | ICD-10-CM | POA: Diagnosis not present

## 2022-11-26 DIAGNOSIS — M1991 Primary osteoarthritis, unspecified site: Secondary | ICD-10-CM | POA: Diagnosis not present

## 2022-11-26 DIAGNOSIS — L4050 Arthropathic psoriasis, unspecified: Secondary | ICD-10-CM | POA: Diagnosis not present

## 2022-11-26 DIAGNOSIS — M5136 Other intervertebral disc degeneration, lumbar region: Secondary | ICD-10-CM | POA: Diagnosis not present

## 2022-11-26 DIAGNOSIS — Z79899 Other long term (current) drug therapy: Secondary | ICD-10-CM | POA: Diagnosis not present

## 2022-12-11 DIAGNOSIS — M9901 Segmental and somatic dysfunction of cervical region: Secondary | ICD-10-CM | POA: Diagnosis not present

## 2022-12-11 DIAGNOSIS — M542 Cervicalgia: Secondary | ICD-10-CM | POA: Diagnosis not present

## 2022-12-11 DIAGNOSIS — M5416 Radiculopathy, lumbar region: Secondary | ICD-10-CM | POA: Diagnosis not present

## 2022-12-11 DIAGNOSIS — M9903 Segmental and somatic dysfunction of lumbar region: Secondary | ICD-10-CM | POA: Diagnosis not present

## 2023-01-08 DIAGNOSIS — M9903 Segmental and somatic dysfunction of lumbar region: Secondary | ICD-10-CM | POA: Diagnosis not present

## 2023-01-08 DIAGNOSIS — M542 Cervicalgia: Secondary | ICD-10-CM | POA: Diagnosis not present

## 2023-01-08 DIAGNOSIS — M5416 Radiculopathy, lumbar region: Secondary | ICD-10-CM | POA: Diagnosis not present

## 2023-01-08 DIAGNOSIS — M9901 Segmental and somatic dysfunction of cervical region: Secondary | ICD-10-CM | POA: Diagnosis not present

## 2023-01-23 ENCOUNTER — Ambulatory Visit: Payer: Medicare Other | Admitting: Podiatry

## 2023-01-23 DIAGNOSIS — M7751 Other enthesopathy of right foot: Secondary | ICD-10-CM

## 2023-01-23 MED ORDER — TRIAMCINOLONE ACETONIDE 40 MG/ML IJ SUSP
20.0000 mg | Freq: Once | INTRAMUSCULAR | Status: AC
Start: 2023-01-23 — End: 2023-01-23
  Administered 2023-01-23: 20 mg

## 2023-01-23 NOTE — Progress Notes (Signed)
She presents today chief complaint of painful second metatarsophalangeal joint states that he did so much better after you injected it last time.  Objective: Vital signs are stable alert oriented x 3.  There is no erythema edema/drainage odor she has pain on palpation of the plantar second and third metatarsal phalangeal joints particularly painful to the second interdigital space.  Assessment: Rigid hammertoe deformities arthritic in nature severe digital deformities right foot.  Capsulitis second and third metatarsophalangeal joints with bursitis plantarly.  Plan: I injected this bursitis and capsulitis today 10 mg Kenalog 5 mg Marcaine distributed amongst the 2 metatarsal heads periarticular.

## 2023-02-05 DIAGNOSIS — M9901 Segmental and somatic dysfunction of cervical region: Secondary | ICD-10-CM | POA: Diagnosis not present

## 2023-02-05 DIAGNOSIS — M5416 Radiculopathy, lumbar region: Secondary | ICD-10-CM | POA: Diagnosis not present

## 2023-02-05 DIAGNOSIS — M9903 Segmental and somatic dysfunction of lumbar region: Secondary | ICD-10-CM | POA: Diagnosis not present

## 2023-02-05 DIAGNOSIS — M542 Cervicalgia: Secondary | ICD-10-CM | POA: Diagnosis not present

## 2023-02-15 ENCOUNTER — Other Ambulatory Visit: Payer: Self-pay | Admitting: Family Medicine

## 2023-02-15 DIAGNOSIS — Z1231 Encounter for screening mammogram for malignant neoplasm of breast: Secondary | ICD-10-CM

## 2023-02-21 ENCOUNTER — Telehealth: Payer: Self-pay | Admitting: Pharmacist

## 2023-02-21 NOTE — Progress Notes (Signed)
02/21/2023  Patient ID: Ashley Rodriguez, female   DOB: 05-07-1950, 72 y.o.   MRN: [id number]  Pharmacy Quality Measure Review  This patient is appearing on report for being at risk of failing the measure for Statin Use in Persons with Diabetes (SUPD) medications this calendar year.   Prior trials of: Atorvastatin 20mg - myalgias  Currently taking Zetia 10mg    Exclusion code M79.10 was last billed in 2021- needs to be billed at upcoming appt on 11/19 again to avoid metric gap (note added to upcoming PCP visit)   Marlowe Aschoff, PharmD [facility] Phone Number: [phone]

## 2023-03-05 DIAGNOSIS — M9903 Segmental and somatic dysfunction of lumbar region: Secondary | ICD-10-CM | POA: Diagnosis not present

## 2023-03-05 DIAGNOSIS — M9901 Segmental and somatic dysfunction of cervical region: Secondary | ICD-10-CM | POA: Diagnosis not present

## 2023-03-05 DIAGNOSIS — M542 Cervicalgia: Secondary | ICD-10-CM | POA: Diagnosis not present

## 2023-03-05 DIAGNOSIS — M5416 Radiculopathy, lumbar region: Secondary | ICD-10-CM | POA: Diagnosis not present

## 2023-03-13 ENCOUNTER — Other Ambulatory Visit: Payer: Self-pay | Admitting: Family Medicine

## 2023-03-13 DIAGNOSIS — E1169 Type 2 diabetes mellitus with other specified complication: Secondary | ICD-10-CM

## 2023-03-14 NOTE — Telephone Encounter (Signed)
Requested Prescriptions  Pending Prescriptions Disp Refills   ezetimibe (ZETIA) 10 MG tablet [Pharmacy Med Name: EZETIMIBE 10MG  TABLET] 30 tablet 1    Sig: TAKE ONE TABLET BY MOUTH TWO TIMES A WEEK     Cardiovascular:  Antilipid - Sterol Transport Inhibitors Failed - 03/13/2023 10:51 AM      Failed - Lipid Panel in normal range within the last 12 months    Cholesterol, Total  Date Value Ref Range Status  09/18/2022 185 100 - 199 mg/dL Final   LDL Chol Calc (NIH)  Date Value Ref Range Status  09/18/2022 98 0 - 99 mg/dL Final   HDL  Date Value Ref Range Status  09/18/2022 45 >39 mg/dL Final   Triglycerides  Date Value Ref Range Status  09/18/2022 250 (H) 0 - 149 mg/dL Final         Passed - AST in normal range and within 360 days    AST  Date Value Ref Range Status  09/18/2022 26 0 - 40 IU/L Final         Passed - ALT in normal range and within 360 days    ALT  Date Value Ref Range Status  09/18/2022 22 0 - 32 IU/L Final         Passed - Patient is not pregnant      Passed - Valid encounter within last 12 months    Recent Outpatient Visits           5 months ago Type 2 diabetes mellitus with other specified complication, without long-term current use of insulin (HCC)   [facility] [place], Marzella Schlein, MD   9 months ago Acute cough   [facility] [place], Marzella Schlein, MD   10 months ago Viral conjunctivitis   [facility], Marzella Schlein, MD   1 year ago Encounter for annual physical exam   [facility] [place], Marzella Schlein, MD   1 year ago Acute cough   [facility] [place], [place], PA-C       Future Appointments             In 5 days Bacigalupo, Marzella Schlein, MD [facility]

## 2023-03-19 ENCOUNTER — Ambulatory Visit (INDEPENDENT_AMBULATORY_CARE_PROVIDER_SITE_OTHER): Payer: Medicare Other | Admitting: Family Medicine

## 2023-03-19 ENCOUNTER — Encounter: Payer: Self-pay | Admitting: Family Medicine

## 2023-03-19 VITALS — BP 132/82 | HR 56 | Ht 62.0 in | Wt 144.5 lb

## 2023-03-19 DIAGNOSIS — E1159 Type 2 diabetes mellitus with other circulatory complications: Secondary | ICD-10-CM | POA: Diagnosis not present

## 2023-03-19 DIAGNOSIS — D485 Neoplasm of uncertain behavior of skin: Secondary | ICD-10-CM

## 2023-03-19 DIAGNOSIS — Z0000 Encounter for general adult medical examination without abnormal findings: Secondary | ICD-10-CM | POA: Diagnosis not present

## 2023-03-19 DIAGNOSIS — I152 Hypertension secondary to endocrine disorders: Secondary | ICD-10-CM

## 2023-03-19 DIAGNOSIS — T466X5D Adverse effect of antihyperlipidemic and antiarteriosclerotic drugs, subsequent encounter: Secondary | ICD-10-CM | POA: Diagnosis not present

## 2023-03-19 DIAGNOSIS — E1169 Type 2 diabetes mellitus with other specified complication: Secondary | ICD-10-CM

## 2023-03-19 DIAGNOSIS — E038 Other specified hypothyroidism: Secondary | ICD-10-CM | POA: Diagnosis not present

## 2023-03-19 DIAGNOSIS — K219 Gastro-esophageal reflux disease without esophagitis: Secondary | ICD-10-CM | POA: Diagnosis not present

## 2023-03-19 DIAGNOSIS — Z0001 Encounter for general adult medical examination with abnormal findings: Secondary | ICD-10-CM

## 2023-03-19 DIAGNOSIS — M791 Myalgia, unspecified site: Secondary | ICD-10-CM | POA: Diagnosis not present

## 2023-03-19 DIAGNOSIS — E785 Hyperlipidemia, unspecified: Secondary | ICD-10-CM

## 2023-03-19 DIAGNOSIS — R0609 Other forms of dyspnea: Secondary | ICD-10-CM | POA: Diagnosis not present

## 2023-03-19 NOTE — Progress Notes (Signed)
Complete physical exam  Patient: Ashley Rodriguez   DOB: [birth year]   72 y.o. Female  MRN: [id number]  Subjective:    Chief Complaint  Patient presents with   Annual Exam    Last complete 03/06/22 and AWV completed 07/16/22 Patient reports consuming a general diet and tries to consume gluten free for some things. She reports exercising four days a week. She reports feeling well and sleeping much better than she use to it as long as she doesn't eat chocolate or drink tea after 5. She reports having a place on her leg that she would like to have looked at. Derm appt is set for Jan 22nd. Spot first noticed three weeks ago. Reports redness and itching.     Ashley Rodriguez is a 72 y.o. female who presents today for a complete physical exam.   Discussed the use of AI scribe software for clinical note transcription with the patient, who gave verbal consent to proceed.  History of Present Illness   The patient, with a history of type two diabetes, hypertension, hyperlipidemia, and statin-induced myalgias, presents for her annual physical. She reports discontinuing Ozempic due to unpleasant side effects, including constant sickness and lack of weight loss. She has a concern about a spot on her skin on the back of her leg, which she initially thought was a scar from a dog claw mark. However, the spot has persisted and has raised concerns. Additionally, the patient has been experiencing breathing issues. She reports feeling winded after physical activities such as walking up hills or stairs. She has seen a cardiologist who ruled out heart issues, but she suspects the issue may be related to her lungs or possibly a side effect of COVID-19 or the vaccine. She has a family history of thyroid issues and has been monitoring her thyroid levels.        Most recent fall risk assessment:    09/18/2022    1:04 PM  Fall Risk   Falls in the past year? 0  Number falls in past yr: 0  Injury with Fall? 0  Risk for  fall due to : No Fall Risks  Follow up Falls evaluation completed     Most recent depression screenings:    09/18/2022    1:04 PM 07/16/2022    3:50 PM  PHQ 2/9 Scores  PHQ - 2 Score 0 0  PHQ- 9 Score 4 4        Patient Care Team: Erasmo Downer, MD as PCP - General (Family Medicine) Isla Pence, OD (Optometry) Delano Metz, MD as Referring Physician (Pain Medicine)   Outpatient Medications Prior to Visit  Medication Sig   acetaminophen (TYLENOL) 325 MG tablet Take 325 mg by mouth every 6 (six) hours as needed.   amLODipine (NORVASC) 5 MG tablet TAKE ONE TABLET BY MOUTH ONCE DAILY AS NEEDED FOR BLOOD PRESSURE   atenolol (TENORMIN) 50 MG tablet TAKE ONE TABLET BY MOUTH ONCE DAILY   benzonatate (TESSALON) 100 MG capsule Take 1 capsule (100 mg total) by mouth 2 (two) times daily as needed for cough.   betamethasone dipropionate 0.05 % lotion Apply 1 application topically as needed.    calcium carbonate (OS-CAL) 600 MG TABS tablet Take 1,200 mg by mouth daily with breakfast.   co-enzyme Q-10 30 MG capsule Take 100 mg by mouth daily.   ENBREL SURECLICK 50 MG/ML injection Inject 50 mg into the skin once a week.    esomeprazole (NEXIUM) 40 MG capsule  TAKE ONE CAPSULE BY MOUTH ONCE DAILY   ezetimibe (ZETIA) 10 MG tablet TAKE ONE TABLET BY MOUTH TWO TIMES A WEEK   fexofenadine (ALLEGRA) 180 MG tablet Take 180 mg by mouth daily.   gabapentin (NEURONTIN) 300 MG capsule TAKE TWO CAPSULES BY MOUTH EVERY MORNING AND THREE CAPSULES BY MOUTH EVERY NIGHT AT BEDTIME   losartan (COZAAR) 25 MG tablet TAKE ONE HALF (1/2) TABLET (12.5 MG TOTAL) BY MOUTH ONCE DAILY   montelukast (SINGULAIR) 10 MG tablet TAKE 1 TABLET BY MOUTH AT BEDTIME   Multiple Vitamins-Minerals (MULTIVITAMIN WITH MINERALS) tablet Take 1 tablet by mouth daily.   TURMERIC PO Take by mouth daily at 6 (six) AM.   valACYclovir (VALTREX) 1000 MG tablet TAKE 2 TABLETS BY MOUTH TWICE DAILY FOR 1 DAY WHEN FEVER BLISTER  OCCURS   [DISCONTINUED] Insulin Pen Needle (NOVOFINE PLUS PEN NEEDLE) 32G X 4 MM MISC To use with semaglutide weekly injection   [DISCONTINUED] ondansetron (ZOFRAN-ODT) 4 MG disintegrating tablet Take 1 tablet (4 mg total) by mouth every 8 (eight) hours as needed for nausea or vomiting.   [DISCONTINUED] Semaglutide,0.25 or 0.5MG /DOS, (OZEMPIC, 0.25 OR 0.5 MG/DOSE,) 2 MG/3ML SOPN Inject 0.5 mg into the skin once a week.   Facility-Administered Medications Prior to Visit  Medication Dose Route Frequency Provider   triamcinolone acetonide (KENALOG-40) injection 60 mg  60 mg Other Once Hyatt, Max T, DPM    ROS        Objective:     BP 132/82 (BP Location: Left Arm, Patient Position: Sitting, Cuff Size: Normal)   Pulse (!) 56   Ht 5\' 2"  (1.575 m)   Wt 144 lb 8 oz (65.5 kg)   SpO2 97%   BMI 26.43 kg/m    Physical Exam Vitals reviewed.  Constitutional:      General: She is not in acute distress.    Appearance: Normal appearance. She is well-developed. She is not diaphoretic.  HENT:     Head: Normocephalic and atraumatic.     Right Ear: Tympanic membrane, ear canal and external ear normal.     Left Ear: Tympanic membrane, ear canal and external ear normal.     Nose: Nose normal.     Mouth/Throat:     Mouth: Mucous membranes are moist.     Pharynx: Oropharynx is clear. No oropharyngeal exudate.  Eyes:     General: No scleral icterus.    Conjunctiva/sclera: Conjunctivae normal.     Pupils: Pupils are equal, round, and reactive to light.  Neck:     Thyroid: No thyromegaly.  Cardiovascular:     Rate and Rhythm: Normal rate and regular rhythm.     Heart sounds: Normal heart sounds. No murmur heard. Pulmonary:     Effort: Pulmonary effort is normal. No respiratory distress.     Breath sounds: Normal breath sounds. No wheezing or rales.  Abdominal:     General: There is no distension.     Palpations: Abdomen is soft.     Tenderness: There is no abdominal tenderness.   Musculoskeletal:        General: No deformity.     Cervical back: Neck supple.     Right lower leg: No edema.     Left lower leg: No edema.  Lymphadenopathy:     Cervical: No cervical adenopathy.  Skin:    General: Skin is warm and dry.     Findings: No rash.  Neurological:     Mental Status: She is alert  and oriented to person, place, and time. Mental status is at baseline.     Gait: Gait normal.  Psychiatric:        Mood and Affect: Mood normal.        Behavior: Behavior normal.        Thought Content: Thought content normal.      No results found for any visits on 03/19/23.     Assessment & Plan:    Routine Health Maintenance and Physical Exam  Immunization History  Administered Date(s) Administered   Fluad Quad(high Dose 65+) 01/01/2019, 01/25/2020   Influenza-Unspecified 01/13/2021, 01/15/2022, 12/24/2022   PFIZER(Purple Top)SARS-COV-2 Vaccination 06/15/2019, 07/06/2019, 02/15/2020   Pneumococcal Conjugate-13 09/20/2014   Pneumococcal Polysaccharide-23 01/09/2012, 03/13/2018   Td 09/08/2013   Tdap 12/31/2005   Zoster Recombinant(Shingrix) 01/13/2021, 04/25/2021    Health Maintenance  Topic Date Due   COVID-19 Vaccine (4 - 2023-24 season) 12/30/2022   HEMOGLOBIN A1C  03/21/2023   OPHTHALMOLOGY EXAM  06/22/2023   Medicare Annual Wellness (AWV)  07/16/2023   DTaP/Tdap/Td (3 - Td or Tdap) 09/09/2023   Diabetic kidney evaluation - eGFR measurement  09/18/2023   Diabetic kidney evaluation - Urine ACR  09/18/2023   DEXA SCAN  11/11/2023   Colonoscopy  02/27/2024   FOOT EXAM  03/18/2024   MAMMOGRAM  03/27/2024   Pneumonia Vaccine 70+ Years old  Completed   INFLUENZA VACCINE  Completed   Hepatitis C Screening  Completed   Zoster Vaccines- Shingrix  Completed   HPV VACCINES  Aged Out    Discussed health benefits of physical activity, and encouraged her to engage in regular exercise appropriate for her age and condition.  Problem List Items Addressed This  Visit       Cardiovascular and Mediastinum   Hypertension associated with diabetes (HCC)    Hypertension managed with losartan 12.5 mg daily, amlodipine 5 mg daily, and atenolol 50 mg daily. Blood pressure is well-controlled. - Continue current antihypertensive medications      Relevant Orders   Comprehensive metabolic panel     Digestive   GERD (gastroesophageal reflux disease)    GERD, previously managed with Nexium. Attempted dosage reduction but experienced severe symptoms. Discussed long-term PPI use. Prefers to resume Nexium to avoid severe symptoms. - Consider resuming Nexium as needed - Explore over-the-counter options for cost management        Endocrine   T2DM (type 2 diabetes mellitus) (HCC)    Type 2 diabetes, not on Ozempic due to adverse effects and lack of weight loss. Discussed importance of monitoring A1c and managing blood sugar levels. Prefers not to resume Ozempic. - Check A1c - Perform foot exam - Review A1c results and determine next steps      Relevant Orders   Comprehensive metabolic panel   Hemoglobin A1c   Hyperlipidemia associated with type 2 diabetes mellitus (HCC)    Hyperlipidemia managed with Zetia 10 mg daily. History of statin-induced myalgias. - Check cholesterol levels      Relevant Orders   Lipid Panel With LDL/HDL Ratio   Subclinical hypothyroidism    Continue to monitor TSH and free T4      Relevant Orders   TSH + free T4     Other   Myalgia due to statin   DOE (dyspnea on exertion)    Reported shortness of breath, especially when walking uphill. Cardiac causes ruled out. Possible pulmonary etiology, considering autoimmune arthritis-related lung issues. Agreed to pulmonologist referral in [place]. - Refer to pulmonologist  for further evaluation      Relevant Orders   Ambulatory referral to Pulmonology   Other Visit Diagnoses     Encounter for annual physical exam    -  Primary   Relevant Orders   Comprehensive  metabolic panel   Lipid Panel With LDL/HDL Ratio   Hemoglobin A1c           Annual Physical Examination Blood pressure is well-controlled. No new issues reported. - Perform foot exam for diabetes - Check A1c, kidney and liver function, and cholesterol - Perform thyroid function test  Skin Lesion Non-healing lesion on the back of the leg, possibly related to psoriasis or non-melanoma skin cancer. Lesion is raised, pearly. Discussed dermatological evaluation and possible removal. - Refer to dermatology for evaluation and possible removal  Psoriatic Arthritis Psoriatic arthritis well-managed with Enbrel for 17 years. Prefers to continue Enbrel therapy due to its effectiveness and insurance coverage. - Continue Enbrel therapy  General Health Maintenance Routine health maintenance discussed, including screenings and vaccinations. Mammogram scheduled for December 2. Colonoscopy due next year. Eye exam due in February. - Ensure mammogram is completed on December 2 - Schedule colonoscopy for next year - Schedule eye exam for February  Follow-up - Follow-up in six months for routine check-up - Review lab results and adjust treatment as necessary.        Return in about 6 months (around 09/16/2023) for chronic disease f/u.     Shirlee Latch, MD

## 2023-03-19 NOTE — Assessment & Plan Note (Signed)
Continue to monitor TSH and free T4

## 2023-03-19 NOTE — Assessment & Plan Note (Signed)
Reported shortness of breath, especially when walking uphill. Cardiac causes ruled out. Possible pulmonary etiology, considering autoimmune arthritis-related lung issues. Agreed to pulmonologist referral in [place]. - Refer to pulmonologist for further evaluation

## 2023-03-19 NOTE — Assessment & Plan Note (Signed)
Hyperlipidemia managed with Zetia 10 mg daily. History of statin-induced myalgias. - Check cholesterol levels

## 2023-03-19 NOTE — Assessment & Plan Note (Signed)
GERD, previously managed with Nexium. Attempted dosage reduction but experienced severe symptoms. Discussed long-term PPI use. Prefers to resume Nexium to avoid severe symptoms. - Consider resuming Nexium as needed - Explore over-the-counter options for cost management

## 2023-03-19 NOTE — Assessment & Plan Note (Signed)
Hypertension managed with losartan 12.5 mg daily, amlodipine 5 mg daily, and atenolol 50 mg daily. Blood pressure is well-controlled. - Continue current antihypertensive medications

## 2023-03-19 NOTE — Assessment & Plan Note (Signed)
Type 2 diabetes, not on Ozempic due to adverse effects and lack of weight loss. Discussed importance of monitoring A1c and managing blood sugar levels. Prefers not to resume Ozempic. - Check A1c - Perform foot exam - Review A1c results and determine next steps

## 2023-03-20 LAB — HEMOGLOBIN A1C
Est. average glucose Bld gHb Est-mCnc: 134 mg/dL
Hgb A1c MFr Bld: 6.3 % — ABNORMAL HIGH (ref 4.8–5.6)

## 2023-03-20 LAB — COMPREHENSIVE METABOLIC PANEL
ALT: 22 [IU]/L (ref 0–32)
AST: 23 [IU]/L (ref 0–40)
Albumin: 4.5 g/dL (ref 3.9–4.9)
Alkaline Phosphatase: 73 [IU]/L (ref 44–121)
BUN/Creatinine Ratio: 17 (ref 12–28)
BUN: 11 mg/dL (ref 8–27)
Bilirubin Total: 0.5 mg/dL (ref 0.0–1.2)
CO2: 26 mmol/L (ref 20–29)
Calcium: 9.9 mg/dL (ref 8.7–10.3)
Chloride: 98 mmol/L (ref 96–106)
Creatinine, Ser: 0.66 mg/dL (ref 0.57–1.00)
Globulin, Total: 2.4 g/dL (ref 1.5–4.5)
Glucose: 115 mg/dL — ABNORMAL HIGH (ref 70–99)
Potassium: 4.7 mmol/L (ref 3.5–5.2)
Sodium: 138 mmol/L (ref 134–144)
Total Protein: 6.9 g/dL (ref 6.0–8.5)
eGFR: 94 mL/min/{1.73_m2} (ref >59)

## 2023-03-20 LAB — TSH+FREE T4
Free T4: 1.06 ng/dL (ref 0.82–1.77)
TSH: 3.09 u[IU]/mL (ref 0.450–4.500)

## 2023-03-20 LAB — LIPID PANEL WITH LDL/HDL RATIO
Cholesterol, Total: 195 mg/dL (ref 100–199)
HDL: 47 mg/dL (ref >39)
LDL Chol Calc (NIH): 118 mg/dL — ABNORMAL HIGH (ref 0–99)
LDL/HDL Ratio: 2.5 ratio (ref 0.0–3.2)
Triglycerides: 169 mg/dL — ABNORMAL HIGH (ref 0–149)
VLDL Cholesterol Cal: 30 mg/dL (ref 5–40)

## 2023-03-22 ENCOUNTER — Encounter: Payer: Medicare Other | Admitting: Family Medicine

## 2023-03-27 ENCOUNTER — Encounter: Payer: Self-pay | Admitting: Pulmonary Disease

## 2023-03-27 ENCOUNTER — Other Ambulatory Visit: Payer: Self-pay | Admitting: Family Medicine

## 2023-03-27 NOTE — Telephone Encounter (Signed)
Requested Prescriptions  Pending Prescriptions Disp Refills   montelukast (SINGULAIR) 10 MG tablet [Pharmacy Med Name: MONTELUKAST SODIUM 10MG  TABLET] 90 tablet 3    Sig: TAKE ONE TABLET BY MOUTH AT BEDTIME     Pulmonology:  Leukotriene Inhibitors Passed - 03/27/2023 10:37 AM      Passed - Valid encounter within last 12 months    Recent Outpatient Visits           1 week ago Encounter for annual physical exam   [facility] [facility] [place], Marzella Schlein, MD   6 months ago Type 2 diabetes mellitus with other specified complication, without long-term current use of insulin [facility])   [place] [facility] [place], Marzella Schlein, MD   9 months ago Acute cough   [place] [facility] [place], Marzella Schlein, MD   10 months ago Viral conjunctivitis   [place] [facility] [place], Marzella Schlein, MD   1 year ago Encounter for annual physical exam   [place] [facility] [place], Marzella Schlein, MD       Future Appointments             In 5 months Bacigalupo, Marzella Schlein, MD [facility]

## 2023-04-01 ENCOUNTER — Ambulatory Visit
Admission: RE | Admit: 2023-04-01 | Discharge: 2023-04-01 | Disposition: A | Discharge status: Home / Self Care | Payer: Medicare Other | Source: Ambulatory Visit | Attending: Family Medicine | Admitting: Family Medicine

## 2023-04-01 DIAGNOSIS — Z1231 Encounter for screening mammogram for malignant neoplasm of breast: Secondary | ICD-10-CM | POA: Diagnosis not present

## 2023-04-02 DIAGNOSIS — M9903 Segmental and somatic dysfunction of lumbar region: Secondary | ICD-10-CM | POA: Diagnosis not present

## 2023-04-02 DIAGNOSIS — M542 Cervicalgia: Secondary | ICD-10-CM | POA: Diagnosis not present

## 2023-04-02 DIAGNOSIS — M9901 Segmental and somatic dysfunction of cervical region: Secondary | ICD-10-CM | POA: Diagnosis not present

## 2023-04-02 DIAGNOSIS — M5416 Radiculopathy, lumbar region: Secondary | ICD-10-CM | POA: Diagnosis not present

## 2023-04-30 ENCOUNTER — Other Ambulatory Visit: Payer: Self-pay | Admitting: Family Medicine

## 2023-04-30 DIAGNOSIS — M9901 Segmental and somatic dysfunction of cervical region: Secondary | ICD-10-CM | POA: Diagnosis not present

## 2023-04-30 DIAGNOSIS — M542 Cervicalgia: Secondary | ICD-10-CM | POA: Diagnosis not present

## 2023-04-30 DIAGNOSIS — M9903 Segmental and somatic dysfunction of lumbar region: Secondary | ICD-10-CM | POA: Diagnosis not present

## 2023-04-30 DIAGNOSIS — M5416 Radiculopathy, lumbar region: Secondary | ICD-10-CM | POA: Diagnosis not present

## 2023-05-02 ENCOUNTER — Other Ambulatory Visit: Payer: Self-pay | Admitting: Family Medicine

## 2023-05-06 NOTE — Telephone Encounter (Signed)
 Requested Prescriptions  Pending Prescriptions Disp Refills   amLODipine  (NORVASC ) 5 MG tablet [Pharmacy Med Name: AMLODIPINE  BESYLATE 5MG  TABLET] 90 tablet 1    Sig: TAKE ONE TABLET BY MOUTH ONCE DAILY AS NEEDED FOR BLOOD PRESSURE     Cardiovascular: Calcium  Channel Blockers 2 Passed - 05/06/2023 11:29 AM      Passed - Last BP in normal range    BP Readings from Last 1 Encounters:  03/19/23 132/82         Passed - Last Heart Rate in normal range    Pulse Readings from Last 1 Encounters:  03/19/23 (!) 56         Passed - Valid encounter within last 6 months    Recent Outpatient Visits           1 month ago Encounter for annual physical exam   [place] [facility] [place], Jon HERO, MD   7 months ago Type 2 diabetes mellitus with other specified complication, without long-term current use of insulin [facility])   [place] [facility] [place], Jon HERO, MD   11 months ago Acute cough   [place] [facility] [place], Jon HERO, MD   11 months ago Viral conjunctivitis   [place] [facility] [place], Jon HERO, MD   1 year ago Encounter for annual physical exam   [place] [facility] [place], Jon HERO, MD       Future Appointments             In 4 months Bacigalupo, Jon HERO, MD [facility], [facility]             atenolol (TENORMIN) 50 MG tablet [Pharmacy Med Name: ATENOLOL 50MG  TABLET] 90 tablet 1    Sig: TAKE ONE TABLET BY MOUTH ONCE DAILY     Cardiovascular: Beta Blockers 2 Passed - 05/06/2023 11:29 AM      Passed - Cr in normal range and within 360 days    Creatinine  Date Value Ref Range Status  04/16/2014 0.68 0.60 - 1.30 mg/dL Final   Creatinine, Ser  Date Value Ref Range Status  03/19/2023 0.66 0.57 - 1.00 mg/dL Final   Creatinine, POC  Date Value Ref Range Status  01/01/2019 NA\ mg/dL Final         Passed - Last BP in normal range    BP  Readings from Last 1 Encounters:  03/19/23 132/82         Passed - Last Heart Rate in normal range    Pulse Readings from Last 1 Encounters:  03/19/23 (!) 56         Passed - Valid encounter within last 6 months    Recent Outpatient Visits           1 month ago Encounter for annual physical exam   [place] [facility] [place], Jon HERO, MD   7 months ago Type 2 diabetes mellitus with other specified complication, without long-term current use of insulin [facility])   [place] [facility] [place], Jon HERO, MD   11 months ago Acute cough   [place] [facility] [place], Jon HERO, MD   11 months ago Viral conjunctivitis   [place] [facility] [place], Jon HERO, MD   1 year ago Encounter for annual physical exam   [facility] [place], Jon HERO, MD       Future Appointments  In 4 months Bacigalupo, Jon HERO, MD [facility]

## 2023-05-09 ENCOUNTER — Other Ambulatory Visit
Admission: RE | Admit: 2023-05-09 | Discharge: 2023-05-09 | Disposition: A | Discharge status: Home / Self Care | Payer: Medicare Other | Source: Ambulatory Visit | Attending: Pulmonary Disease | Admitting: Pulmonary Disease

## 2023-05-09 ENCOUNTER — Ambulatory Visit
Admission: RE | Admit: 2023-05-09 | Discharge: 2023-05-09 | Disposition: A | Discharge status: Home / Self Care | Payer: Medicare Other | Source: Ambulatory Visit | Attending: Pulmonary Disease | Admitting: Pulmonary Disease

## 2023-05-09 ENCOUNTER — Telehealth: Payer: Self-pay

## 2023-05-09 ENCOUNTER — Ambulatory Visit: Payer: Medicare Other | Admitting: Pulmonary Disease

## 2023-05-09 ENCOUNTER — Encounter: Payer: Self-pay | Admitting: Pulmonary Disease

## 2023-05-09 VITALS — BP 140/80 | HR 62 | Temp 97.6°F | Ht 62.0 in | Wt 149.2 lb

## 2023-05-09 DIAGNOSIS — R0602 Shortness of breath: Secondary | ICD-10-CM

## 2023-05-09 LAB — CBC WITH DIFFERENTIAL/PLATELET
Abs Immature Granulocytes: 0.01 10*3/uL (ref 0.00–0.07)
Basophils Absolute: 0 10*3/uL (ref 0.0–0.1)
Basophils Relative: 1 %
Eosinophils Absolute: 0.2 10*3/uL (ref 0.0–0.5)
Eosinophils Relative: 4 %
HCT: 42.6 % (ref 36.0–46.0)
Hemoglobin: 14.3 g/dL (ref 12.0–15.0)
Immature Granulocytes: 0 %
Lymphocytes Relative: 41 %
Lymphs Abs: 2.1 10*3/uL (ref 0.7–4.0)
MCH: 32 pg (ref 26.0–34.0)
MCHC: 33.6 g/dL (ref 30.0–36.0)
MCV: 95.3 fL (ref 80.0–100.0)
Monocytes Absolute: 0.5 10*3/uL (ref 0.1–1.0)
Monocytes Relative: 10 %
Neutro Abs: 2.4 10*3/uL (ref 1.7–7.7)
Neutrophils Relative %: 44 %
Platelets: 377 10*3/uL (ref 150–400)
RBC: 4.47 MIL/uL (ref 3.87–5.11)
RDW: 13.2 % (ref 11.5–15.5)
WBC: 5.2 10*3/uL (ref 4.0–10.5)
nRBC: 0 % (ref 0.0–0.2)

## 2023-05-09 LAB — NITRIC OXIDE: Nitric Oxide: 34

## 2023-05-09 MED ORDER — FLUTICASONE PROPIONATE 50 MCG/ACT NA SUSP: 1.0000 | Freq: Every day | NASAL | 2 refills | Status: AC

## 2023-05-09 MED ORDER — FLUTICASONE-SALMETEROL 115-21 MCG/ACT IN AERO: 2.0000 | INHALATION_SPRAY | Freq: Two times a day (BID) | RESPIRATORY_TRACT | 12 refills | Status: DC

## 2023-05-09 MED ORDER — BUDESONIDE-FORMOTEROL FUMARATE 160-4.5 MCG/ACT IN AERO: 2.0000 | INHALATION_SPRAY | Freq: Two times a day (BID) | RESPIRATORY_TRACT | 12 refills | Status: DC

## 2023-05-09 NOTE — Telephone Encounter (Signed)
 Patient was seen today. She the Symbicort you sent in is too expensive. Can you send in another inhaler?  [place] pharmacy.

## 2023-05-10 NOTE — Telephone Encounter (Signed)
 I have notified the patient. Nothing further needed.

## 2023-05-11 NOTE — Progress Notes (Signed)
 Synopsis: Referred in by Myrla Jon HERO, MD   Subjective:   PATIENT ID: Lawrnce Rodriguez GENDER: female DOB: 15-Jun-1950, MRN: [id number]  Chief Complaint  Patient presents with   Consult    Shortness of breath on exertion.     HPI Ashley Rodriguez is a 73 year old female patient with a past medical history of hypertension, GERD, type II DM, HLD and psoriatic arthritis presenting today to the pulmonary clinic for further evaluation of shortness of breath on exertion.   She reports 2 years of worsening shortness on exertion specifically going up the stairs. She denies any cough, chest tightness or wheezing. She denies any syncope or presyncope event.   She never carried the diagnosis of asthma. She had COVID twice but never required hospitalization.   Echocardiogram 03/2021 - Normal LVEF, No significant valvular disease, normal heart pressures.   Family history - No family history of pulmonary diseases   Social history - Never smoker, drinks wine occasionally, currently retired but worked at data  processing for 38 years. Has 1 daughter who is healthy.   ROS All systems were reviewed and are negative except for the above.  Objective:   Vitals:   05/09/23 1029 05/09/23 1312  BP: 102/62 (!) 140/80  Pulse: 62   Temp: 97.6 F (36.4 C)   TempSrc: Temporal   SpO2: 97%   Weight: 149 lb 3.2 oz (67.7 kg)   Height: 5' 2 (1.575 m)    97% on RA BMI Readings from Last 3 Encounters:  05/09/23 27.29 kg/m  03/19/23 26.43 kg/m  09/18/22 26.59 kg/m   Wt Readings from Last 3 Encounters:  05/09/23 149 lb 3.2 oz (67.7 kg)  03/19/23 144 lb 8 oz (65.5 kg)  09/18/22 145 lb 6.4 oz (66 kg)    Physical Exam GEN: NAD, Healthy Appearing HEENT: Supple Neck, Reactive Pupils, EOMI  CVS: Normal S1, Normal S2, RRR, No murmurs or ES appreciated  Lungs: Clear bilateral air entry.  Abdomen: Soft, non tender, non distended, + BS  Extremities: Warm and well perfused, No edema  Skin: No  suspicious lesions appreciated  Psych: Normal Affect  Ancillary Information   CBC    Component Value Date/Time   WBC 5.2 05/09/2023 1248   RBC 4.47 05/09/2023 1248   HGB 14.3 05/09/2023 1248   HGB 14.9 03/07/2022 0855   HCT 42.6 05/09/2023 1248   HCT 45.1 03/07/2022 0855   PLT 377 05/09/2023 1248   PLT 382 03/07/2022 0855   MCV 95.3 05/09/2023 1248   MCV 97 03/07/2022 0855   MCH 32.0 05/09/2023 1248   MCHC 33.6 05/09/2023 1248   RDW 13.2 05/09/2023 1248   RDW 12.3 03/07/2022 0855   LYMPHSABS 2.1 05/09/2023 1248   LYMPHSABS 2.1 03/07/2022 0855   MONOABS 0.5 05/09/2023 1248   EOSABS 0.2 05/09/2023 1248   EOSABS 0.2 03/07/2022 0855   BASOSABS 0.0 05/09/2023 1248   BASOSABS 0.1 03/07/2022 0855       No data to display           Assessment & Plan:  Ashley Rodriguez is a 73 year old female patient with a past medical history of hypertension, GERD, type II DM, HLD and psoriatic arthritis presenting today to the pulmonary clinic for further evaluation of shortness of breath on exertion.   #Dyspnea on exertion  Impression is post covid reactive airway disease, no signs of cardiac disease with recent normal echocardiogram. Never smoker. No signs of pulmonary hypertension. No imaging to  evaluate parenchymal lund disease however exam was benign. FENO 34.  She does report chronic congestionof sinuses. And exam w/ erythematous nasal mucosa.    []  PFTs  []  CXR for today  []  CBC w/ diff, Allergen panel and IgE   []  Start Fluticasone -Salmeterol [Advair HFA] 115 2 puffs BID.  []  C/w Albuterol 2puffs Q6H as needed  []  Start flonase  1 spray each nostril BID.   Return in about 3 months (around 08/07/2023).  I spent 60 minutes caring for this patient today, including preparing to see the patient, obtaining a medical history , reviewing a separately obtained history, performing a medically appropriate examination and/or evaluation, counseling and educating the patient/family/caregiver,  ordering medications, tests, or procedures, documenting clinical information in the electronic health record, and independently interpreting results (not separately reported/billed) and communicating results to the patient/family/caregiver  Darrin Barn, MD [facility] 05/11/2023 3:29 PM

## 2023-05-12 LAB — ALLERGEN PANEL (27) + IGE
Alternaria Alternata IgE: <0.1 kU/L
Aspergillus Fumigatus IgE: <0.1 kU/L
Bahia Grass IgE: <0.1 kU/L
Bermuda Grass IgE: <0.1 kU/L
Cat Dander IgE: <0.1 kU/L
Cedar, Mountain IgE: <0.1 kU/L
Cladosporium Herbarum IgE: <0.1 kU/L
Cocklebur IgE: <0.1 kU/L
Cockroach, American IgE: <0.1 kU/L
Common Silver Birch IgE: <0.1 kU/L
D Farinae IgE: <0.1 kU/L
D Pteronyssinus IgE: <0.1 kU/L
Dog Dander IgE: <0.1 kU/L
Elm, American IgE: <0.1 kU/L
Hickory, White IgE: <0.1 kU/L
IgE (Immunoglobulin E), Serum: 15 [IU]/mL (ref 6–495)
Johnson Grass IgE: <0.1 kU/L
Kentucky Bluegrass IgE: <0.1 kU/L
Maple/Box Elder IgE: <0.1 kU/L
Mucor Racemosus IgE: <0.1 kU/L
Oak, White IgE: <0.1 kU/L
Penicillium Chrysogen IgE: <0.1 kU/L
Pigweed, Rough IgE: <0.1 kU/L
Plantain, English IgE: <0.1 kU/L
Ragweed, Short IgE: <0.1 kU/L
Setomelanomma Rostrat: <0.1 kU/L
Timothy Grass IgE: <0.1 kU/L
White Mulberry IgE: <0.1 kU/L

## 2023-05-13 ENCOUNTER — Telehealth: Payer: Self-pay | Admitting: Pulmonary Disease

## 2023-05-13 MED ORDER — FLUTICASONE-SALMETEROL 250-50 MCG/ACT IN AEPB: 1.0000 | INHALATION_SPRAY | Freq: Two times a day (BID) | RESPIRATORY_TRACT | 3 refills | Status: DC

## 2023-05-13 NOTE — Addendum Note (Signed)
 Addended by: Janann Colonel on: 05/13/2023 03:31 PM   Modules accepted: Orders

## 2023-05-13 NOTE — Telephone Encounter (Signed)
 Per secure chat from Dr. Larinda Buttery, he has sent in an alternative medication.  Nothing further needed.

## 2023-05-13 NOTE — Telephone Encounter (Signed)
 You sent in Advair for this patient. Her insurance will not pay for the 115 mcg dose. They will pay for the or the . Which would you like to change it to?

## 2023-05-13 NOTE — Telephone Encounter (Signed)
 Pt's insurance will cover Advair Diskus 100 mcg or 250 mcg. Generic form please allow substitutions

## 2023-05-14 ENCOUNTER — Telehealth: Payer: Self-pay | Admitting: Pulmonary Disease

## 2023-05-14 NOTE — Telephone Encounter (Signed)
 Patient states Advair is too expensive. Would like another inhaler called into pharmacy. Pharmacy is [place]. Patient phone number is [phone].

## 2023-05-14 NOTE — Telephone Encounter (Signed)
 I spoke with the patient. She said she is not interested in using an inhaler until she has had her breathing test to confirm she needs it. I told her I would let Dr. Malka know and we would hold off on finding an alternative to the Advair at this time.  Nothing further needed.  Dr. Malka this is just an FYI.

## 2023-05-22 DIAGNOSIS — D2272 Melanocytic nevi of left lower limb, including hip: Secondary | ICD-10-CM | POA: Diagnosis not present

## 2023-05-22 DIAGNOSIS — L821 Other seborrheic keratosis: Secondary | ICD-10-CM | POA: Diagnosis not present

## 2023-05-22 DIAGNOSIS — D225 Melanocytic nevi of trunk: Secondary | ICD-10-CM | POA: Diagnosis not present

## 2023-05-22 DIAGNOSIS — D2262 Melanocytic nevi of left upper limb, including shoulder: Secondary | ICD-10-CM | POA: Diagnosis not present

## 2023-05-22 DIAGNOSIS — L720 Epidermal cyst: Secondary | ICD-10-CM | POA: Diagnosis not present

## 2023-05-22 DIAGNOSIS — L570 Actinic keratosis: Secondary | ICD-10-CM | POA: Diagnosis not present

## 2023-05-22 DIAGNOSIS — D2271 Melanocytic nevi of right lower limb, including hip: Secondary | ICD-10-CM | POA: Diagnosis not present

## 2023-05-22 DIAGNOSIS — D2261 Melanocytic nevi of right upper limb, including shoulder: Secondary | ICD-10-CM | POA: Diagnosis not present

## 2023-05-27 DIAGNOSIS — Z79899 Other long term (current) drug therapy: Secondary | ICD-10-CM | POA: Diagnosis not present

## 2023-05-27 DIAGNOSIS — L409 Psoriasis, unspecified: Secondary | ICD-10-CM | POA: Diagnosis not present

## 2023-05-27 DIAGNOSIS — R5383 Other fatigue: Secondary | ICD-10-CM | POA: Diagnosis not present

## 2023-05-27 DIAGNOSIS — M1991 Primary osteoarthritis, unspecified site: Secondary | ICD-10-CM | POA: Diagnosis not present

## 2023-05-27 DIAGNOSIS — L4050 Arthropathic psoriasis, unspecified: Secondary | ICD-10-CM | POA: Diagnosis not present

## 2023-05-28 DIAGNOSIS — M9901 Segmental and somatic dysfunction of cervical region: Secondary | ICD-10-CM | POA: Diagnosis not present

## 2023-05-28 DIAGNOSIS — M5416 Radiculopathy, lumbar region: Secondary | ICD-10-CM | POA: Diagnosis not present

## 2023-05-28 DIAGNOSIS — M9903 Segmental and somatic dysfunction of lumbar region: Secondary | ICD-10-CM | POA: Diagnosis not present

## 2023-05-28 DIAGNOSIS — M542 Cervicalgia: Secondary | ICD-10-CM | POA: Diagnosis not present

## 2023-06-25 DIAGNOSIS — H2513 Age-related nuclear cataract, bilateral: Secondary | ICD-10-CM | POA: Diagnosis not present

## 2023-06-25 DIAGNOSIS — M5431 Sciatica, right side: Secondary | ICD-10-CM | POA: Diagnosis not present

## 2023-06-25 DIAGNOSIS — D3131 Benign neoplasm of right choroid: Secondary | ICD-10-CM | POA: Diagnosis not present

## 2023-06-25 DIAGNOSIS — M9901 Segmental and somatic dysfunction of cervical region: Secondary | ICD-10-CM | POA: Diagnosis not present

## 2023-06-25 DIAGNOSIS — E119 Type 2 diabetes mellitus without complications: Secondary | ICD-10-CM | POA: Diagnosis not present

## 2023-06-25 DIAGNOSIS — M5417 Radiculopathy, lumbosacral region: Secondary | ICD-10-CM | POA: Diagnosis not present

## 2023-06-25 DIAGNOSIS — M9903 Segmental and somatic dysfunction of lumbar region: Secondary | ICD-10-CM | POA: Diagnosis not present

## 2023-06-25 LAB — HM DIABETES EYE EXAM

## 2023-07-11 NOTE — Patient Instructions (Signed)

## 2023-07-11 NOTE — Progress Notes (Signed)
 PROVIDER NOTE: Information contained herein reflects review and annotations entered in association with encounter. Interpretation of such information and data should be left to medically-trained personnel. Information provided to patient can be located elsewhere in the medical record under "Patient Instructions". Document created using STT-dictation technology, any transcriptional errors that may result from process are unintentional.    Patient: Ashley Rodriguez  Service Category: E/M  Provider: Oswaldo Done, MD  DOB: 1951/02/23  DOS: 07/15/2023  Referring Provider: Erasmo Downer, MD  MRN: [id number]  Specialty: Interventional Pain Management  PCP: Ashley Downer, MD  Type: Established Patient  Setting: Ambulatory outpatient    Location: Office  Delivery: Face-to-face     HPI  [name], a 73 y.o. year old female, is here today because of her Chronic pain of lower extremity, bilateral [M79.604, M79.605, G89.29]. [name] primary complain today is Hip Pain (right)  Pertinent problems: [name] has Psoriatic arthritis (HCC); Chronic low back pain (1ry area of Pain) (Bilateral) (R>L) w/ sciatica (Bilateral); Chronic lower extremity pain (2ry area of Pain) (Bilateral) (R>L); Grade 1 Anterolisthesis of L5 over S1; L5 pars defect with spondylolisthesis (Bilateral); DDD (degenerative disc disease), lumbar; Lumbar facet syndrome (Bilateral) (R>L); Chronic pain syndrome; Chronic musculoskeletal pain; Neurogenic pain; Chronic sacroiliac joint pain (Bilateral) (R>L); Osteoarthritis of lumbar spine; Spondylosis without myelopathy or radiculopathy, lumbosacral region; Peripheral neuropathy; Generalized osteoarthritis of hand; Numbness and tingling of foot; Low back pain radiating to both legs; Abnormal EMG (chronic right lower lumbar polyradiculopathy); Chronic lower lumbar polyradiculopathy (Right); DDD (degenerative disc disease), lumbosacral; Lumbar spondylosis w/ polyradiculopathy  (Right); Lumbosacral radiculopathy (Right); Other intervertebral disc degeneration, lumbar region; Spondylolisthesis of lumbosacral region; Other spondylosis, sacral and sacrococcygeal region; Somatic dysfunction of sacroiliac joints (Bilateral); Osteoarthritis of sacroiliac joints (Bilateral); Myalgia due to statin; Chronic sacroiliac joint pain (Right); Osteoarthritis of sacroiliac joint (HCC) (Right); Enthesopathy of sacroiliac joint; Chronic low back pain (Bilateral) w/o sciatica; Chronic sacroiliac joint pain (Left); Osteoarthritis of sacroiliac joint (Left) (HCC); Somatic dysfunction of sacroiliac joint (Left); Sciatica, left side; Lumbar facet joint pain; and Lumbosacral radiculopathy at L5 (Right) on their pertinent problem list. Pain Assessment: Severity of Chronic pain is reported as a 6 /10. Location: Hip Right/radiates down right leg to foot all over. Onset: More than a month ago. Quality: Tingling, Aching. Timing: Constant. Modifying factor(s): meds. Vitals:  height is 5\' 2"  (1.575 m) and weight is 144 lb (65.3 kg). Her temperature is 98.5 F (36.9 C). Her blood pressure is 155/73 (abnormal) and her pulse is 65. Her oxygen saturation is 98%.  BMI: Estimated body mass index is 26.34 kg/m as calculated from the following:   Height as of this encounter: 5\' 2"  (1.575 m).   Weight as of this encounter: 144 lb (65.3 kg). Last encounter: 08/30/2022. Last procedure: Visit date not found.  Reason for encounter: evaluation of worsening, or previously known (established) problem.  Discussed the use of AI scribe software for clinical note transcription with the patient, who gave verbal consent to proceed.  History of Present Illness   Ashley Rodriguez is a 73 year old female who presents with leg pain and numbness.  She experiences pain primarily in her leg, radiating down to her foot, with numbness and tingling particularly on the top of the foot and affecting the big toe. This pain is distinct from  her previous lower back pain.  The pain in her buttocks is severe in the morning, rated as nine out of ten, but improves throughout  the day. Previous radiofrequency treatment for lower back pain lasted almost a year, but current symptoms suggest a different underlying issue.  She has never had an MRI due to claustrophobia and is concerned about undergoing the procedure. She has not tried using Valium to manage her claustrophobia during an MRI.  She reports having a limp but denies any weakness. She is able to extend her foot and has a good range of motion, indicating that the pain and numbness have not affected her motor function.       Pharmacotherapy Assessment  Analgesic: No opioid analgesics from our practice.   Monitoring: [place] PMP: PDMP not reviewed this encounter.       Pharmacotherapy: No side-effects or adverse reactions reported. Compliance: No problems identified. Effectiveness: Clinically acceptable.  Ashley Ou, RN  07/15/2023  8:16 AM  Sign when Signing Visit Safety precautions to be maintained throughout the outpatient stay will include: orient to surroundings, keep bed in low position, maintain call bell within reach at all times, provide assistance with transfer out of bed and ambulation.     No results found for: "CBDTHCR" No results found for: "D8THCCBX" No results found for: "D9THCCBX"  UDS:  No results found for: "SUMMARY"    ROS  Constitutional: Denies any fever or chills Gastrointestinal: No reported hemesis, hematochezia, vomiting, or acute GI distress Musculoskeletal: Denies any acute onset joint swelling, redness, loss of ROM, or weakness Neurological: No reported episodes of acute onset apraxia, aphasia, dysarthria, agnosia, amnesia, paralysis, loss of coordination, or loss of consciousness  Medication Review  Turmeric, acetaminophen, amLODipine, atenolol, betamethasone dipropionate, calcium carbonate, co-enzyme Q-10, esomeprazole, etanercept,  ezetimibe, fexofenadine, fluticasone, fluticasone-salmeterol, gabapentin, losartan, montelukast, multivitamin with minerals, and valACYclovir  History Review  Allergy: Ms. Fryman is allergic to atorvastatin, sulfamethoxazole-trimethoprim, and codeine. Drug: Ms. Strider  reports no history of drug use. Alcohol:  reports current alcohol use of about 2.0 - 6.0 standard drinks of alcohol per week. Tobacco:  reports that she has never smoked. She has never used smokeless tobacco. Social: Ms. Campus  reports that she has never smoked. She has never used smokeless tobacco. She reports current alcohol use of about 2.0 - 6.0 standard drinks of alcohol per week. She reports that she does not use drugs. Medical:  has a past medical history of Allergy, COVID-19 (12/15/2018), Flank lipoma (08/17/2016), GERD (gastroesophageal reflux disease), Hypertension, Neck mass (04/07/2013), Psoriatic arthritis (HCC), Recurrent cellulitis, Sleep apnea, and T2DM (type 2 diabetes mellitus) (HCC) (10/01/2018). Surgical: Ms. Lundquist  has a past surgical history that includes Supracervical abdominal hysterectomy (04/30/1998); lipoma removed (04/30/2012); Colonoscopy (04/30/2013); Neck mass excision (04/28/2014); Tubal ligation (1981); and Abdominal hysterectomy (2000). Family: family history includes Arrhythmia in her nephew; Arthritis in her maternal grandmother; Atrial fibrillation in her brother and mother; Cancer in her father; Hypertension in her mother; Ovarian cancer in her paternal aunt; Supraventricular tachycardia in her brother.  Laboratory Chemistry Profile   Renal Lab Results  Component Value Date   BUN 11 03/19/2023   CREATININE 0.66 03/19/2023   BCR 17 03/19/2023   GFRAA 94 05/17/2020   GFRNONAA 81 05/17/2020    Hepatic Lab Results  Component Value Date   AST 23 03/19/2023   ALT 22 03/19/2023   ALBUMIN 4.5 03/19/2023   ALKPHOS 73 03/19/2023    Electrolytes Lab Results  Component Value Date   NA 138  03/19/2023   K 4.7 03/19/2023   CL 98 03/19/2023   CALCIUM 9.9 03/19/2023   MG 2.1 01/03/2017  Bone Lab Results  Component Value Date   VD25OH 47.6 03/07/2022   25OHVITD1 50 01/03/2017   25OHVITD2 <1.0 01/03/2017   25OHVITD3 50 01/03/2017    Inflammation (CRP: Acute Phase) (ESR: Chronic Phase) Lab Results  Component Value Date   CRP 0.8 01/03/2017   ESRSEDRATE 2 01/25/2020         Note: Above Lab results reviewed.  Recent Imaging Review  DG Chest 2 View CLINICAL DATA:  Shortness of breath.  EXAM: CHEST - 2 VIEW  COMPARISON:  None Available.  FINDINGS: The heart size and mediastinal contours are within normal limits. Both lungs are clear. Scoliosis.  IMPRESSION: No active cardiopulmonary disease.  Electronically Signed   By: Sherian Rein M.D.   On: 05/13/2023 11:12 Note: Reviewed        Physical Exam  General appearance: Well nourished, well developed, and well hydrated. In no apparent acute distress Mental status: Alert, oriented x 3 (person, place, & time)       Respiratory: No evidence of acute respiratory distress Eyes: PERLA Vitals: BP (!) 155/73   Pulse 65   Temp 98.5 F (36.9 C)   Ht 5\' 2"  (1.575 m)   Wt 144 lb (65.3 kg)   SpO2 98%   BMI 26.34 kg/m  BMI: Estimated body mass index is 26.34 kg/m as calculated from the following:   Height as of this encounter: 5\' 2"  (1.575 m).   Weight as of this encounter: 144 lb (65.3 kg). Ideal: Ideal body weight: 50.1 kg (110 lb 7.2 oz) Adjusted ideal body weight: 56.2 kg (123 lb 13.9 oz)  Physical Exam   MUSCULOSKELETAL: Normal range of motion in the foot       Assessment   Diagnosis Status  1. Chronic lower extremity pain (2ry area of Pain) (Bilateral) (R>L)   2. Lumbosacral radiculopathy at L5 (Right)   3. Lumbosacral radiculopathy (Right)   4. Chronic low back pain (1ry area of Pain) (Bilateral) (R>L) w/ sciatica (Bilateral)   5. Chronic low back pain (Bilateral) w/o sciatica   6. Chronic  lower lumbar polyradiculopathy (Right)   7. Degeneration of intervertebral disc of lumbosacral region with discogenic back pain and lower extremity pain   8. Grade 1 Anterolisthesis of L5 over S1   9. L5 pars defect with spondylolisthesis (Bilateral)   10. Low back pain radiating to both legs    Worsening Having a Flare-up Recurring   Updated Problems: Problem  Lumbosacral radiculopathy at L5 (Right)    Plan of Care  Problem-specific:  Assessment and Plan    Lumbar Radiculopathy   She presents with leg pain, numbness, and tingling, primarily affecting the top of the foot and the big toe, indicating L5 nerve root involvement. These symptoms suggest pressure on the L5 nerve root, distinct from the previous lower back pain treated with radiofrequency ablation. The current issue likely originates from inside the spine. Claustrophobia complicates further diagnostic imaging with MRI. Administer an epidural steroid injection to address leg pain and numbness. Consider a CT scan if symptoms persist or do not improve with the injection. Discuss MRI with sedation using Valium if further imaging is necessary and symptoms do not resolve. Informed consent for the epidural injection has been obtained, including discussion of risks related to proximity to the spine and the need for sedation to prevent movement.  Chronic Lower Back Pain   Chronic lower back pain was previously treated with radiofrequency ablation, providing relief for nearly a year. Current leg symptoms differ  from previous back pain, suggesting a different underlying issue. Radiofrequency ablation may still benefit back pain, typically lasting 3 to 18 months. Monitor the effectiveness of the previous radiofrequency ablation and reassess if back pain becomes predominant again.  Claustrophobia   Claustrophobia has prevented undergoing an MRI, requiring sedation for procedures due to anxiety related to confined spaces. Consider sedation with  Valium for future MRI if necessary.  Follow-up   Awaiting insurance approval for the next procedure. The plan is contingent on approval and scheduling of the epidural injection. Coordinate with insurance for approval and schedule the procedure once approval is obtained.       [name] has a current medication list which includes the following long-term medication(s): amlodipine, atenolol, calcium carbonate, enbrel sureclick, esomeprazole, ezetimibe, fexofenadine, fluticasone, fluticasone-salmeterol, fluticasone-salmeterol, gabapentin, losartan, and montelukast.  Pharmacotherapy (Medications Ordered): No orders of the defined types were placed in this encounter.  Orders:  Orders Placed This Encounter  Procedures   Lumbar Epidural Injection    Standing Status:   Future    Expiration Date:   10/11/2023    Scheduling Instructions:     Procedure: Interlaminar Lumbar Epidural Steroid injection (LESI)  L4-5     Laterality: Right-sided     Sedation: Patient's choice.     Timeframe: As soon as schedule allows.    Where will this procedure be performed?:   [facility]   Follow-up plan:   Return for (ECT): (R) L4-5 LESI #2.      Interventional Therapies  Risk Factors  Considerations:  [employer] DENIED L-MRI (02/17/19)    Planned  Pending:   Therapeutic right L4-5 LESI #2    Under consideration:   Therapeutic right lumbar facet RFA #2  Therapeutic left lumbar facet RFA #2  Therapeutic right L4-5 LESI #2    Completed:   Diagnostic/therapeutic right L4-5 LESI x1 (01/22/2019) (50/50/90 x2 weeks)  Therapeutic bilateral lumbar facet MBB x7 (04/10/2022) (100/100/95/90-100/duration of up to 8 months)  Therapeutic bilateral SI Blk x4 (11/09/2021) (100/100/80) (100/100/100/50)  Therapeutic right (L4-5, L5-S1) lumbar facet RFA x2 (07/10/2022) (100/100/75/75) (first 1 lasted 3 years)  Therapeutic left lumbar facet RFA x1 (08/06/2019) (100/100/75/75)  Therapeutic right lumbar facet  RFA x1 (07/10/2022) ( (100/100/75/75)  Therapeutic right SI RFA x1 (03/17/2020) (100/100/100/90-100)  Therapeutic left SI RFA x1 (03/31/2020) (100/100/100 x3 weeks/70)    Completed by other providers:      Therapeutic  Palliative (PRN) options:   Palliative lumbar facet MBB  Therapeutic lumbar facet RFA  Therapeutic SI RFA  Palliative SI joint Blk  Therapeutic L4-5 LESI      Recent Visits No visits were found meeting these conditions. Showing recent visits within past 90 days and meeting all other requirements Today's Visits Date Type Provider Dept  07/15/23 Office Visit Delano Metz, MD [facility]  Showing today's visits and meeting all other requirements Future Appointments No visits were found meeting these conditions. Showing future appointments within next 90 days and meeting all other requirements  I discussed the assessment and treatment plan with the patient. The patient was provided an opportunity to ask questions and all were answered. The patient agreed with the plan and demonstrated an understanding of the instructions.  Patient advised to call back or seek an in-person evaluation if the symptoms or condition worsens.  Duration of encounter: 30 minutes.  Total time on encounter, as per AMA guidelines included both the face-to-face and non-face-to-face time personally spent by the physician and/or other qualified health  care professional(s) on the day of the encounter (includes time in activities that require the physician or other qualified health care professional and does not include time in activities normally performed by clinical staff). Physician's time may include the following activities when performed: Preparing to see the patient (e.g., pre-charting review of records, searching for previously ordered imaging, lab work, and nerve conduction tests) Review of prior analgesic pharmacotherapies. Reviewing PMP Interpreting ordered tests (e.g., lab  work, imaging, nerve conduction tests) Performing post-procedure evaluations, including interpretation of diagnostic procedures Obtaining and/or reviewing separately obtained history Performing a medically appropriate examination and/or evaluation Counseling and educating the patient/family/caregiver Ordering medications, tests, or procedures Referring and communicating with other health care professionals (when not separately reported) Documenting clinical information in the electronic or other health record Independently interpreting results (not separately reported) and communicating results to the patient/ family/caregiver Care coordination (not separately reported)  Note by: Ashley Done, MD Date: 07/15/2023; Time: 8:50 AM

## 2023-07-15 ENCOUNTER — Encounter: Payer: Self-pay | Admitting: Pain Medicine

## 2023-07-15 ENCOUNTER — Ambulatory Visit: Attending: Pain Medicine | Admitting: Pain Medicine

## 2023-07-15 VITALS — BP 155/73 | HR 65 | Temp 98.5°F | Ht 62.0 in | Wt 144.0 lb

## 2023-07-15 DIAGNOSIS — M4310 Spondylolisthesis, site unspecified: Secondary | ICD-10-CM | POA: Insufficient documentation

## 2023-07-15 DIAGNOSIS — M51372 Other intervertebral disc degeneration, lumbosacral region with discogenic back pain and lower extremity pain: Secondary | ICD-10-CM | POA: Diagnosis not present

## 2023-07-15 DIAGNOSIS — M5442 Lumbago with sciatica, left side: Secondary | ICD-10-CM | POA: Diagnosis not present

## 2023-07-15 DIAGNOSIS — G8929 Other chronic pain: Secondary | ICD-10-CM | POA: Insufficient documentation

## 2023-07-15 DIAGNOSIS — M5416 Radiculopathy, lumbar region: Secondary | ICD-10-CM | POA: Insufficient documentation

## 2023-07-15 DIAGNOSIS — M4726 Other spondylosis with radiculopathy, lumbar region: Secondary | ICD-10-CM

## 2023-07-15 DIAGNOSIS — M545 Low back pain, unspecified: Secondary | ICD-10-CM | POA: Insufficient documentation

## 2023-07-15 DIAGNOSIS — M5417 Radiculopathy, lumbosacral region: Secondary | ICD-10-CM | POA: Diagnosis not present

## 2023-07-15 DIAGNOSIS — M47816 Spondylosis without myelopathy or radiculopathy, lumbar region: Secondary | ICD-10-CM

## 2023-07-15 DIAGNOSIS — M79604 Pain in right leg: Secondary | ICD-10-CM | POA: Diagnosis not present

## 2023-07-15 DIAGNOSIS — M79605 Pain in left leg: Secondary | ICD-10-CM | POA: Insufficient documentation

## 2023-07-15 DIAGNOSIS — M5459 Other low back pain: Secondary | ICD-10-CM

## 2023-07-15 DIAGNOSIS — M4316 Spondylolisthesis, lumbar region: Secondary | ICD-10-CM | POA: Diagnosis not present

## 2023-07-15 DIAGNOSIS — M5441 Lumbago with sciatica, right side: Secondary | ICD-10-CM | POA: Diagnosis not present

## 2023-07-15 DIAGNOSIS — M47817 Spondylosis without myelopathy or radiculopathy, lumbosacral region: Secondary | ICD-10-CM

## 2023-07-15 NOTE — Progress Notes (Signed)
 Safety precautions to be maintained throughout the outpatient stay will include: orient to surroundings, keep bed in low position, maintain call bell within reach at all times, provide assistance with transfer out of bed and ambulation.

## 2023-07-23 ENCOUNTER — Ambulatory Visit: Admitting: Pain Medicine

## 2023-08-01 ENCOUNTER — Ambulatory Visit
Admission: RE | Admit: 2023-08-01 | Discharge: 2023-08-01 | Disposition: A | Discharge status: Home / Self Care | Source: Ambulatory Visit | Attending: Pain Medicine | Admitting: Pain Medicine

## 2023-08-01 ENCOUNTER — Encounter: Payer: Self-pay | Admitting: Pain Medicine

## 2023-08-01 ENCOUNTER — Ambulatory Visit: Attending: Pain Medicine | Admitting: Pain Medicine

## 2023-08-01 VITALS — BP 151/73 | HR 57 | Temp 97.2°F | Resp 16 | Ht 62.0 in | Wt 145.0 lb

## 2023-08-01 DIAGNOSIS — M4310 Spondylolisthesis, site unspecified: Secondary | ICD-10-CM | POA: Insufficient documentation

## 2023-08-01 DIAGNOSIS — M4726 Other spondylosis with radiculopathy, lumbar region: Secondary | ICD-10-CM | POA: Insufficient documentation

## 2023-08-01 DIAGNOSIS — M5442 Lumbago with sciatica, left side: Secondary | ICD-10-CM | POA: Insufficient documentation

## 2023-08-01 DIAGNOSIS — M5441 Lumbago with sciatica, right side: Secondary | ICD-10-CM | POA: Insufficient documentation

## 2023-08-01 DIAGNOSIS — M79604 Pain in right leg: Secondary | ICD-10-CM | POA: Insufficient documentation

## 2023-08-01 DIAGNOSIS — G8929 Other chronic pain: Secondary | ICD-10-CM | POA: Diagnosis not present

## 2023-08-01 DIAGNOSIS — M5417 Radiculopathy, lumbosacral region: Secondary | ICD-10-CM | POA: Insufficient documentation

## 2023-08-01 DIAGNOSIS — M51372 Other intervertebral disc degeneration, lumbosacral region with discogenic back pain and lower extremity pain: Secondary | ICD-10-CM | POA: Insufficient documentation

## 2023-08-01 DIAGNOSIS — M79605 Pain in left leg: Secondary | ICD-10-CM | POA: Insufficient documentation

## 2023-08-01 MED ORDER — PENTAFLUOROPROP-TETRAFLUOROETH EX AERO: INHALATION_SPRAY | Freq: Once | CUTANEOUS | Status: DC

## 2023-08-01 MED ORDER — MIDAZOLAM HCL 5 MG/5ML IJ SOLN
0.5000 mg | Freq: Once | INTRAMUSCULAR | Status: AC
Start: 2023-08-01 — End: 2023-08-01
  Administered 2023-08-01: 2 mg via INTRAVENOUS

## 2023-08-01 MED ORDER — IOHEXOL 180 MG/ML  SOLN
10.0000 mL | Freq: Once | INTRAMUSCULAR | Status: AC
Start: 2023-08-01 — End: 2023-08-01
  Administered 2023-08-01: 10 mL via EPIDURAL

## 2023-08-01 MED ORDER — ROPIVACAINE HCL 2 MG/ML IJ SOLN
INTRAMUSCULAR | Status: AC
  Filled 2023-08-01: qty 20

## 2023-08-01 MED ORDER — ROPIVACAINE HCL 2 MG/ML IJ SOLN
2.0000 mL | Freq: Once | INTRAMUSCULAR | Status: AC
Start: 2023-08-01 — End: 2023-08-01
  Administered 2023-08-01: 2 mL via EPIDURAL

## 2023-08-01 MED ORDER — FENTANYL CITRATE (PF) 100 MCG/2ML IJ SOLN
INTRAMUSCULAR | Status: AC
Start: 2023-08-01 — End: ?
  Filled 2023-08-01: qty 2

## 2023-08-01 MED ORDER — TRIAMCINOLONE ACETONIDE 40 MG/ML IJ SUSP
INTRAMUSCULAR | Status: AC
  Filled 2023-08-01: qty 1

## 2023-08-01 MED ORDER — LIDOCAINE HCL (PF) 2 % IJ SOLN
INTRAMUSCULAR | Status: AC
  Filled 2023-08-01: qty 10

## 2023-08-01 MED ORDER — SODIUM CHLORIDE (PF) 0.9 % IJ SOLN
INTRAMUSCULAR | Status: AC
  Filled 2023-08-01: qty 10

## 2023-08-01 MED ORDER — IOHEXOL 180 MG/ML  SOLN
INTRAMUSCULAR | Status: AC
  Filled 2023-08-01: qty 10

## 2023-08-01 MED ORDER — SODIUM CHLORIDE 0.9% FLUSH
2.0000 mL | Freq: Once | INTRAVENOUS | Status: AC
  Administered 2023-08-01: 2 mL

## 2023-08-01 MED ORDER — TRIAMCINOLONE ACETONIDE 40 MG/ML IJ SUSP
40.0000 mg | Freq: Once | INTRAMUSCULAR | Status: AC
Start: 2023-08-01 — End: 2023-08-01
  Administered 2023-08-01: 40 mg

## 2023-08-01 MED ORDER — FENTANYL CITRATE (PF) 100 MCG/2ML IJ SOLN
25.0000 ug | INTRAMUSCULAR | Status: DC | PRN
Start: 2023-08-01 — End: 2023-08-01

## 2023-08-01 MED ORDER — MIDAZOLAM HCL 5 MG/5ML IJ SOLN
INTRAMUSCULAR | Status: AC
  Filled 2023-08-01: qty 5

## 2023-08-01 MED ORDER — LIDOCAINE HCL 2 % IJ SOLN
20.0000 mL | Freq: Once | INTRAMUSCULAR | Status: AC
Start: 2023-08-01 — End: 2023-08-01
  Administered 2023-08-01: 100 mg

## 2023-08-01 NOTE — Progress Notes (Signed)
 PROVIDER NOTE: Interpretation of information contained herein should be left to medically-trained personnel. Specific patient instructions are provided elsewhere under "Patient Instructions" section of medical record. This document was created in part using STT-dictation technology, any transcriptional errors that may result from this process are unintentional.  Patient: Ashley Rodriguez Type: Established DOB: 1950/09/23 MRN: [id number] PCP: Erasmo Downer, MD  Service: Procedure DOS: 08/01/2023 Setting: Ambulatory Location: Ambulatory outpatient facility Delivery: Face-to-face Provider: Oswaldo Done, MD Specialty: Interventional Pain Management Specialty designation: 09 Location: Outpatient facility Ref. Prov.: Bacigalupo, Marzella Schlein, MD       Interventional Therapy   Type: Lumbar epidural steroid injection (LESI) (interlaminar) #2    Laterality: Right   Level:  L4-5 Level.  Imaging: Fluoroscopic guidance Spinal ([id number]) Anesthesia: Local anesthesia (1-2% Lidocaine) Anxiolysis: IV Versed         Sedation: Moderate Sedation                       DOS: 08/01/2023  Performed by: Oswaldo Done, MD  Purpose: Diagnostic/Therapeutic Indications: Lumbar radicular pain of intraspinal etiology of more than 4 weeks that has failed to respond to conservative therapy and is severe enough to impact quality of life or function. 1. Chronic low back pain (1ry area of Pain) (Bilateral) (R>L) w/ sciatica (Bilateral)   2. Chronic lower extremity pain (2ry area of Pain) (Bilateral) (R>L)   3. Degeneration of intervertebral disc of lumbosacral region with discogenic back pain and lower extremity pain   4. Grade 1 Anterolisthesis of L5 over S1   5. L5 pars defect with spondylolisthesis (Bilateral)   6. Lumbar spondylosis w/ polyradiculopathy (Right)   7. Lumbosacral radiculopathy at L5 (Right)    NAS-11 Pain score:   Pre-procedure: 4 /10   Post-procedure: 0-No pain/10      Position /  Prep / Materials:  Position: Prone w/ head of the table raised (slight reverse trendelenburg) to facilitate breathing.  Prep solution: ChloraPrep (2% chlorhexidine gluconate and 70% isopropyl alcohol) Prep Area: Entire Posterior Lumbar Region from lower scapular tip down to mid buttocks area and from flank to flank. Materials:  Tray: Epidural tray Needle(s):  Type: Epidural needle (Tuohy) Gauge (G):  17 Length: Regular (3.5-in) Qty: 1  H&P (Pre-op Assessment):  Ms. Gundy is a 73 y.o. (year old), female patient, seen today for interventional treatment. She  has a past surgical history that includes Supracervical abdominal hysterectomy (04/30/1998); lipoma removed (04/30/2012); Colonoscopy (04/30/2013); Neck mass excision (04/28/2014); Tubal ligation (1981); and Abdominal hysterectomy (2000). Ms. Lezotte has a current medication list which includes the following prescription(s): acetaminophen, amlodipine, atenolol, betamethasone dipropionate, calcium carbonate, co-enzyme q-10, enbrel sureclick, esomeprazole, ezetimibe, fexofenadine, fluticasone-salmeterol, gabapentin, losartan, montelukast, multivitamin with minerals, turmeric, valacyclovir, fluticasone, and fluticasone-salmeterol, and the following Facility-Administered Medications: fentanyl, pentafluoroprop-tetrafluoroeth, and triamcinolone acetonide. Her primarily concern today is the Back Pain (lower)  Initial Vital Signs:  Pulse/HCG Rate: (!) 57ECG Heart Rate: (!) 59 (sb) Temp: (!) 97.2 F (36.2 C) Resp: 14 BP: (!) 161/98 SpO2: 97 %  BMI: Estimated body mass index is 26.52 kg/m as calculated from the following:   Height as of this encounter: 5\' 2"  (1.575 m).   Weight as of this encounter: 145 lb (65.8 kg).  Risk Assessment: Allergies: Reviewed. She is allergic to atorvastatin, sulfamethoxazole-trimethoprim, and codeine.  Allergy Precautions: None required Coagulopathies: Reviewed. None identified.  Blood-thinner therapy: None at  this time Active Infection(s): Reviewed. None identified. Ms. Ailes is afebrile  Site Confirmation: Ms.  Kerchner was asked to confirm the procedure and laterality before marking the site Procedure checklist: Completed Consent: Before the procedure and under the influence of no sedative(s), amnesic(s), or anxiolytics, the patient was informed of the treatment options, risks and possible complications. To fulfill our ethical and legal obligations, as recommended by the American Medical Association's Code of Ethics, I have informed the patient of my clinical impression; the nature and purpose of the treatment or procedure; the risks, benefits, and possible complications of the intervention; the alternatives, including doing nothing; the risk(s) and benefit(s) of the alternative treatment(s) or procedure(s); and the risk(s) and benefit(s) of doing nothing. The patient was provided information about the general risks and possible complications associated with the procedure. These may include, but are not limited to: failure to achieve desired goals, infection, bleeding, organ or nerve damage, allergic reactions, paralysis, and death. In addition, the patient was informed of those risks and complications associated to Spine-related procedures, such as failure to decrease pain; infection (i.e.: Meningitis, epidural or intraspinal abscess); bleeding (i.e.: epidural hematoma, subarachnoid hemorrhage, or any other type of intraspinal or peri-dural bleeding); organ or nerve damage (i.e.: Any type of peripheral nerve, nerve root, or spinal cord injury) with subsequent damage to sensory, motor, and/or autonomic systems, resulting in permanent pain, numbness, and/or weakness of one or several areas of the body; allergic reactions; (i.e.: anaphylactic reaction); and/or death. Furthermore, the patient was informed of those risks and complications associated with the medications. These include, but are not limited to:  allergic reactions (i.e.: anaphylactic or anaphylactoid reaction(s)); adrenal axis suppression; blood sugar elevation that in diabetics may result in ketoacidosis or comma; water retention that in patients with history of congestive heart failure may result in shortness of breath, pulmonary edema, and decompensation with resultant heart failure; weight gain; swelling or edema; medication-induced neural toxicity; particulate matter embolism and blood vessel occlusion with resultant organ, and/or nervous system infarction; and/or aseptic necrosis of one or more joints. Finally, the patient was informed that Medicine is not an exact science; therefore, there is also the possibility of unforeseen or unpredictable risks and/or possible complications that may result in a catastrophic outcome. The patient indicated having understood very clearly. We have given the patient no guarantees and we have made no promises. Enough time was given to the patient to ask questions, all of which were answered to the patient's satisfaction. Ms. Roh has indicated that she wanted to continue with the procedure. Attestation: I, the ordering provider, attest that I have discussed with the patient the benefits, risks, side-effects, alternatives, likelihood of achieving goals, and potential problems during recovery for the procedure that I have provided informed consent. Date  Time: 08/01/2023  9:43 AM  Pre-Procedure Preparation:  Monitoring: As per clinic protocol. Respiration, ETCO2, SpO2, BP, heart rate and rhythm monitor placed and checked for adequate function Safety Precautions: Patient was assessed for positional comfort and pressure points before starting the procedure. Time-out: I initiated and conducted the "Time-out" before starting the procedure, as per protocol. The patient was asked to participate by confirming the accuracy of the "Time Out" information. Verification of the correct person, site, and procedure were  performed and confirmed by me, the nursing staff, and the patient. "Time-out" conducted as per Joint Commission's Universal Protocol (UP.01.01.01). Time: 1100 Start Time: 1100 hrs.  Description/Narrative of Procedure:          Target: Epidural space via interlaminar opening, initially targeting the lower laminar border of the superior vertebral body. Region:  Lumbar Approach: Percutaneous paravertebral  Rationale (medical necessity): procedure needed and proper for the diagnosis and/or treatment of the patient's medical symptoms and needs. Procedural Technique Safety Precautions: Aspiration looking for blood return was conducted prior to all injections. At no point did we inject any substances, as a needle was being advanced. No attempts were made at seeking any paresthesias. Safe injection practices and needle disposal techniques used. Medications properly checked for expiration dates. SDV (single dose vial) medications used. Description of the Procedure: Protocol guidelines were followed. The procedure needle was introduced through the skin, ipsilateral to the reported pain, and advanced to the target area. Bone was contacted and the needle walked caudad, until the lamina was cleared. The epidural space was identified using "loss-of-resistance technique" with 2-3 ml of PF-NaCl (0.9% NSS), in a 5cc LOR glass syringe.  Vitals:   08/01/23 1058 08/01/23 1103 08/01/23 1108 08/01/23 1116  BP: (!) 178/112 (!) 166/94 (!) 147/86 (!) 151/73  Pulse:      Resp: 18 17 15 16   Temp:      TempSrc:      SpO2: 100% 99% 100% 100%  Weight:      Height:        Start Time: 1100 hrs. End Time: 1107 hrs.  Imaging Guidance (Spinal):          Type of Imaging Technique: Fluoroscopy Guidance (Spinal) Indication(s): Fluoroscopy guidance for needle placement to enhance accuracy in procedures requiring precise needle localization for targeted delivery of medication in or near specific anatomical locations not easily  accessible without such real-time imaging assistance. Exposure Time: Please see nurses notes. Contrast: Before injecting any contrast, we confirmed that the patient did not have an allergy to iodine, shellfish, or radiological contrast. Once satisfactory needle placement was completed at the desired level, radiological contrast was injected. Contrast injected under live fluoroscopy. No contrast complications. See chart for type and volume of contrast used. Fluoroscopic Guidance: I was personally present during the use of fluoroscopy. "Tunnel Vision Technique" used to obtain the best possible view of the target area. Parallax error corrected before commencing the procedure. "Direction-depth-direction" technique used to introduce the needle under continuous pulsed fluoroscopy. Once target was reached, antero-posterior, oblique, and lateral fluoroscopic projection used confirm needle placement in all planes. Images permanently stored in EMR. Interpretation: I personally interpreted the imaging intraoperatively. Adequate needle placement confirmed in multiple planes. Appropriate spread of contrast into desired area was observed. No evidence of afferent or efferent intravascular uptake. No intrathecal or subarachnoid spread observed. Permanent images saved into the patient's record.  Antibiotic Prophylaxis:   Anti-infectives (From admission, onward)    None      Indication(s): None identified  Post-operative Assessment:  Post-procedure Vital Signs:  Pulse/HCG Rate: (!) 57(!) 58 Temp: (!) 97.2 F (36.2 C) Resp: 16 BP: (!) 151/73 SpO2: 100 %  EBL: None  Complications: No immediate post-treatment complications observed by team, or reported by patient.  Note: The patient tolerated the entire procedure well. A repeat set of vitals were taken after the procedure and the patient was kept under observation following institutional policy, for this type of procedure. Post-procedural neurological  assessment was performed, showing return to baseline, prior to discharge. The patient was provided with post-procedure discharge instructions, including a section on how to identify potential problems. Should any problems arise concerning this procedure, the patient was given instructions to immediately contact us, at any time, without hesitation. In any case, we plan to contact the patient by telephone for  a follow-up status report regarding this interventional procedure.  Comments:  No additional relevant information.  Plan of Care (POC)  Orders:  Orders Placed This Encounter  Procedures   Lumbar Epidural Injection    Scheduling Instructions:     Procedure: Interlaminar LESI L4-5     Laterality: Right     Sedation: Patient's choice     Timeframe: Today    Where will this procedure be performed?:   [facility]   [facility] C-ARM 1-60 MIN NO REPORT    Intraoperative interpretation by procedural physician at [facility].    Standing Status:   Standing    Number of Occurrences:   1    Reason for exam::   Assistance in needle guidance and placement for procedures requiring needle placement in or near specific anatomical locations not easily accessible without such assistance.   Informed Consent Details: Physician/Practitioner Attestation; Transcribe to consent form and obtain patient signature    Note: Always confirm laterality of pain with Ms. Lequita Halt, before procedure. Transcribe to consent form and obtain patient signature.    Physician/Practitioner attestation of informed consent for procedure/surgical case:   I, the physician/practitioner, attest that I have discussed with the patient the benefits, risks, side effects, alternatives, likelihood of achieving goals and potential problems during recovery for the procedure that I have provided informed consent.    Procedure:   Lumbar epidural steroid injection under fluoroscopic guidance    Physician/Practitioner  performing the procedure:   Song Garris A. Laban Emperor, MD    Indication/Reason:   Low back and/or lower extremity pain secondary to lumbar radiculitis   Provide equipment / supplies at bedside    Procedural tray: Epidural Tray (Disposable  single use) Skin infiltration needle: Regular 1.5-in, 25-G, (x1) Block needle size: Regular standard Catheter: No catheter required    Standing Status:   Standing    Number of Occurrences:   1    Specify:   Epidural Tray   Saline lock IV    Have LR [id number] mL available and administer at 125 mL/hr if patient becomes hypotensive.    Standing Status:   Standing    Number of Occurrences:   1   Chronic Opioid Analgesic:  No opioid analgesics from our practice.   Medications ordered for procedure: Meds ordered this encounter  Medications   iohexol (OMNIPAQUE) 180 MG/ML injection 10 mL    Must be Myelogram-compatible. If not available, you may substitute with a water-soluble, non-ionic, hypoallergenic, myelogram-compatible radiological contrast medium.   lidocaine (XYLOCAINE) 2 % (with pres) injection 400 mg   pentafluoroprop-tetrafluoroeth (GEBAUERS) aerosol   midazolam (VERSED) 5 MG/5ML injection 0.5-2 mg    Make sure Flumazenil is available in the pyxis when using this medication. If oversedation occurs, administer 0.2 mg IV over 15 sec. If after 45 sec no response, administer 0.2 mg again over 1 min; may repeat at 1 min intervals; not to exceed 4 doses (1 mg)   fentaNYL (SUBLIMAZE) injection 25-50 mcg    Make sure Narcan is available in the pyxis when using this medication. In the event of respiratory depression (RR< 8/min): Titrate NARCAN (naloxone) in increments of 0.1 to 0.2 mg IV at 2-3 minute intervals, until desired degree of reversal.   sodium chloride flush (NS) 0.9 % injection 2 mL   ropivacaine (PF) 2 mg/mL (0.2%) (NAROPIN) injection 2 mL   triamcinolone acetonide (KENALOG-40) injection 40 mg   Medications administered: We administered  iohexol, lidocaine, midazolam, sodium chloride flush,  ropivacaine (PF) 2 mg/mL (0.2%), and triamcinolone acetonide.  See the medical record for exact dosing, route, and time of administration.  Follow-up plan:   Return in about 2 weeks (around 08/15/2023) for (Face2F), (PPE).       Interventional Therapies  Risk Factors  Considerations:  BCBS[employer] DENIED L-MRI (02/17/19)    Planned  Pending:   Therapeutic right L4-5 LESI #2    Under consideration:   Therapeutic right lumbar facet RFA #2  Therapeutic left lumbar facet RFA #2  Therapeutic right L4-5 LESI #2    Completed:   Diagnostic/therapeutic right L4-5 LESI x1 (01/22/2019) (50/50/90 x2 weeks)  Therapeutic bilateral lumbar facet MBB x7 (04/10/2022) (100/100/95/90-100/duration of up to 8 months)  Therapeutic bilateral SI Blk x4 (11/09/2021) (100/100/80) (100/100/100/50)  Therapeutic right (L4-5, L5-S1) lumbar facet RFA x2 (07/10/2022) (100/100/75/75) (first 1 lasted 3 years)  Therapeutic left lumbar facet RFA x1 (08/06/2019) (100/100/75/75)  Therapeutic right lumbar facet RFA x1 (07/10/2022) ( (100/100/75/75)  Therapeutic right SI RFA x1 (03/17/2020) (100/100/100/90-100)  Therapeutic left SI RFA x1 (03/31/2020) (100/100/100 x3 weeks/70)    Completed by other providers:      Therapeutic  Palliative (PRN) options:   Palliative lumbar facet MBB  Therapeutic lumbar facet RFA  Therapeutic SI RFA  Palliative SI joint Blk  Therapeutic L4-5 LESI      Recent Visits Date Type Provider Dept  07/15/23 Office Visit Delano Metz, MD [facility]  Showing recent visits within past 90 days and meeting all other requirements Today's Visits Date Type Provider Dept  08/01/23 Procedure visit Delano Metz, MD [facility]  Showing today's visits and meeting all other requirements Future Appointments Date Type Provider Dept  08/15/23 Appointment Delano Metz, MD [facility]  Showing future  appointments within next 90 days and meeting all other requirements  Disposition: Discharge home  Discharge (Date  Time): 08/01/2023; 1128 hrs.   Primary Care Physician: Erasmo Downer, MD Location: [facility] Note by: Oswaldo Done, MD (TTS technology used. I apologize for any typographical errors that were not detected and corrected.) Date: 08/01/2023; Time: 1:04 PM  Disclaimer:  Medicine is not an Visual merchandiser. The only guarantee in medicine is that nothing is guaranteed. It is important to note that the decision to proceed with this intervention was based on the information collected from the patient. The Data and conclusions were drawn from the patient's questionnaire, the interview, and the physical examination. Because the information was provided in large part by the patient, it cannot be guaranteed that it has not been purposely or unconsciously manipulated. Every effort has been made to obtain as much relevant data as possible for this evaluation. It is important to note that the conclusions that lead to this procedure are derived in large part from the available data. Always take into account that the treatment will also be dependent on availability of resources and existing treatment guidelines, considered by other Pain Management Practitioners as being common knowledge and practice, at the time of the intervention. For Medico-Legal purposes, it is also important to point out that variation in procedural techniques and pharmacological choices are the acceptable norm. The indications, contraindications, technique, and results of the above procedure should only be interpreted and judged by a Board-Certified Interventional Pain Specialist with extensive familiarity and expertise in the same exact procedure and technique.

## 2023-08-01 NOTE — Patient Instructions (Signed)

## 2023-08-02 ENCOUNTER — Telehealth: Payer: Self-pay

## 2023-08-02 NOTE — Telephone Encounter (Signed)
 No issues post-procedure.

## 2023-08-15 ENCOUNTER — Ambulatory Visit: Admitting: Pain Medicine

## 2023-08-19 ENCOUNTER — Ambulatory Visit: Admitting: Podiatry

## 2023-08-19 ENCOUNTER — Ambulatory Visit (INDEPENDENT_AMBULATORY_CARE_PROVIDER_SITE_OTHER)

## 2023-08-19 DIAGNOSIS — D2371 Other benign neoplasm of skin of right lower limb, including hip: Secondary | ICD-10-CM | POA: Diagnosis not present

## 2023-08-19 DIAGNOSIS — M2041 Other hammer toe(s) (acquired), right foot: Secondary | ICD-10-CM

## 2023-08-19 DIAGNOSIS — M7751 Other enthesopathy of right foot: Secondary | ICD-10-CM | POA: Diagnosis not present

## 2023-08-19 NOTE — Progress Notes (Unsigned)
 She presents today chief complaint of painful lesion to the plantar lateral aspect of the fifth metatarsal area of the right foot.  She states has been there for last couple of weeks or if come up suddenly she is certain ways I walk it really hurts.  Objective: Vital signs stable alert oriented x 3 there is no erythema edema cellulitis drainage or odor.  She does have a palpable bursa beneath the fifth metatarsal head of the right foot.  With an overlying benign skin lesion that is exquisitely painful.  Radiographs taken today do demonstrate severe hallux varus and digital deformities associated with psoriatic arthritis.  Assessment: Bursitis benign skin lesion subfifth met base right.  Plan: I injected that area today with dexamethasone  and local anesthetic.  Debrided benign skin lesion placed padding.  Follow up with her on an as-needed basis.

## 2023-08-21 MED ORDER — DEXAMETHASONE SODIUM PHOSPHATE 120 MG/30ML IJ SOLN
2.0000 mg | Freq: Once | INTRAMUSCULAR | Status: AC
  Administered 2023-08-21: 2 mg via INTRA_ARTICULAR

## 2023-08-21 NOTE — Addendum Note (Signed)
 Addended by: Pheobe Brass E on: 08/21/2023 11:00 AM   Modules accepted: Orders, Level of Service

## 2023-09-03 ENCOUNTER — Encounter: Payer: Self-pay | Admitting: Pain Medicine

## 2023-09-03 ENCOUNTER — Ambulatory Visit: Attending: Pain Medicine | Admitting: Pain Medicine

## 2023-09-03 VITALS — BP 140/68 | HR 67 | Temp 98.2°F | Resp 16 | Ht 62.0 in | Wt 125.0 lb

## 2023-09-03 DIAGNOSIS — M5442 Lumbago with sciatica, left side: Secondary | ICD-10-CM | POA: Insufficient documentation

## 2023-09-03 DIAGNOSIS — M79605 Pain in left leg: Secondary | ICD-10-CM | POA: Insufficient documentation

## 2023-09-03 DIAGNOSIS — G8929 Other chronic pain: Secondary | ICD-10-CM | POA: Diagnosis not present

## 2023-09-03 DIAGNOSIS — M79604 Pain in right leg: Secondary | ICD-10-CM | POA: Diagnosis not present

## 2023-09-03 DIAGNOSIS — M5441 Lumbago with sciatica, right side: Secondary | ICD-10-CM | POA: Insufficient documentation

## 2023-09-03 DIAGNOSIS — M5417 Radiculopathy, lumbosacral region: Secondary | ICD-10-CM | POA: Insufficient documentation

## 2023-09-03 DIAGNOSIS — Z09 Encounter for follow-up examination after completed treatment for conditions other than malignant neoplasm: Secondary | ICD-10-CM | POA: Diagnosis not present

## 2023-09-03 NOTE — Patient Instructions (Signed)

## 2023-09-03 NOTE — Progress Notes (Unsigned)
 PROVIDER NOTE: Interpretation of information contained herein should be left to medically-trained personnel. Specific patient instructions are provided elsewhere under "Patient Instructions" section of medical record. This document was created in part using AI and STT-dictation technology, any transcriptional errors that may result from this process are unintentional.  Patient: Ashley Rodriguez  Service: E/M   PCP: Mazie Speed, MD  DOB: 1950-11-21  DOS: 09/03/2023  Provider: Candi Chafe, MD  MRN: [id number]  Delivery: Face-to-face  Specialty: Interventional Pain Management  Type: Established Patient  Setting: Ambulatory outpatient facility  Specialty designation: 09  Referring Prov.: David Escort Stan Eans, MD  Location: Outpatient office facility       HPI  [name], a 73 y.o. year old female, is here today because of her Chronic bilateral low back pain with bilateral sciatica [M54.42, M54.41, G89.29]. Ms. Zaworski primary complain today is Back Pain (Right, lower)  Pertinent problems: Ms. Arutyunyan has Psoriatic arthritis (HCC); Chronic low back pain (1ry area of Pain) (Bilateral) (R>L) w/ sciatica (Bilateral); Chronic lower extremity pain (2ry area of Pain) (Bilateral) (R>L); Grade 1 Anterolisthesis of L5 over S1; L5 pars defect with spondylolisthesis (Bilateral); DDD (degenerative disc disease), lumbar; Lumbar facet syndrome (Bilateral) (R>L); Chronic pain syndrome; Chronic musculoskeletal pain; Neurogenic pain; Chronic sacroiliac joint pain (Bilateral) (R>L); Osteoarthritis of lumbar spine; Spondylosis without myelopathy or radiculopathy, lumbosacral region; Peripheral neuropathy; Generalized osteoarthritis of hand; Numbness and tingling of foot; Low back pain radiating to both legs; Abnormal EMG (chronic right lower lumbar polyradiculopathy); Chronic lower lumbar polyradiculopathy (Right); DDD (degenerative disc disease), lumbosacral; Lumbar spondylosis w/ polyradiculopathy (Right);  Lumbosacral radiculopathy (Right); Other intervertebral disc degeneration, lumbar region; Spondylolisthesis of lumbosacral region; Other spondylosis, sacral and sacrococcygeal region; Somatic dysfunction of sacroiliac joints (Bilateral); Osteoarthritis of sacroiliac joints (Bilateral); Myalgia due to statin; Chronic sacroiliac joint pain (Right); Osteoarthritis of sacroiliac joint (HCC) (Right); Enthesopathy of sacroiliac joint; Chronic low back pain (Bilateral) w/o sciatica; Chronic sacroiliac joint pain (Left); Osteoarthritis of sacroiliac joint (Left) (HCC); Somatic dysfunction of sacroiliac joint (Left); Sciatica, left side; Lumbar facet joint pain; and Lumbosacral radiculopathy at L5 (Right) on their pertinent problem list. Pain Assessment: Severity of Chronic pain is reported as a 5 /10. Location: Back Right, Lower/right buttock, right leg to the bottom of the right foot. Onset: More than a month ago. Quality: Stabbing, Tightness, Tingling. Timing: Intermittent. Modifying factor(s): medications, rest. Vitals:  height is 5\' 2"  (1.575 m) and weight is 125 lb (56.7 kg). Her temporal temperature is 98.2 F (36.8 C). Her blood pressure is 140/68 (abnormal) and her pulse is 67. Her respiration is 16 and oxygen saturation is 98%.  BMI: Estimated body mass index is 22.86 kg/m as calculated from the following:   Height as of this encounter: 5\' 2"  (1.575 m).   Weight as of this encounter: 125 lb (56.7 kg). Last encounter: 07/15/2023. Last procedure: 08/01/2023.  Reason for encounter: post-procedure evaluation and assessment.   Discussed the use of AI scribe software for clinical note transcription with the patient, who gave verbal consent to proceed.  History of Present Illness   Ashley Rodriguez is a 73 year old female who presents for follow-up after her second right-sided L4-5 lumbar epidural steroid injection for bilateral low back pain and bilateral lower extremity pain.  She underwent her second  right-sided L4-5 lumbar epidural steroid injection under fluoroscopic guidance. Immediately post-procedure, she experienced no pain in her back or leg due to numbness. However, she continues to experience pain, rating it as a  one or two on some days.  Her leg pain persists, primarily in the right lower extremity, described as occurring in the morning in her elbow and the back of her calves. The pain is more pronounced in the toes and the bottom of the foot. She also reports pain in her right buttock and the middle of her back. No prior back surgery. Persistent numbness and pain in the bottom of her foot. The left side is not significantly painful, but she feels weakness in her lower back when standing for extended periods, described as spanning across her lower back.  The right leg and back pain are more prominent than the left. No new weakness or changes in her usual condition, stating 'my leg doesn't give or nothing like that.' In March 2024, she underwent a radiofrequency procedure of the lumbar facet, which provided significant relief for a time. She has not had any recent MRIs due to claustrophobia and the need for sedation.      Post-procedure evaluation   Type: Lumbar epidural steroid injection (LESI) (interlaminar) #2    Laterality: Right   Level:  L4-5 Level.  Imaging: Fluoroscopic guidance Spinal ([id number]) Anesthesia: Local anesthesia (1-2% Lidocaine ) Anxiolysis: IV Versed          Sedation: Moderate Sedation                       DOS: 08/01/2023  Performed by: Candi Chafe, MD  Purpose: Diagnostic/Therapeutic Indications: Lumbar radicular pain of intraspinal etiology of more than 4 weeks that has failed to respond to conservative therapy and is severe enough to impact quality of life or function. 1. Chronic low back pain (1ry area of Pain) (Bilateral) (R>L) w/ sciatica (Bilateral)   2. Chronic lower extremity pain (2ry area of Pain) (Bilateral) (R>L)   3. Degeneration of  intervertebral disc of lumbosacral region with discogenic back pain and lower extremity pain   4. Grade 1 Anterolisthesis of L5 over S1   5. L5 pars defect with spondylolisthesis (Bilateral)   6. Lumbar spondylosis w/ polyradiculopathy (Right)   7. Lumbosacral radiculopathy at L5 (Right)    NAS-11 Pain score:   Pre-procedure: 4 /10   Post-procedure: 0-No pain/10    Effectiveness:  Initial hour after procedure: 100 %. Subsequent 4-6 hours post-procedure: 50 %. Analgesia past initial 6 hours: 50 % (lasted 2 weeks). Ongoing improvement:  Analgesic: According to the patient she attained 100% relief of the pain only for the first hour after the procedure followed by a decrease to a 50% improvement that lasted for only 2 weeks. Function: Transient improvement ROM: Transient improvement   Pharmacotherapy Assessment  Analgesic: No opioid analgesics from our practice.   Monitoring: [place] PMP: PDMP reviewed during this encounter.       Pharmacotherapy: No side-effects or adverse reactions reported. Compliance: No problems identified. Effectiveness: Clinically acceptable.  No notes on file  No results found for: "CBDTHCR" No results found for: "D8THCCBX" No results found for: "D9THCCBX"  UDS:  No results found for: "SUMMARY"    ROS  Constitutional: Denies any fever or chills Gastrointestinal: No reported hemesis, hematochezia, vomiting, or acute GI distress Musculoskeletal: Denies any acute onset joint swelling, redness, loss of ROM, or weakness Neurological: No reported episodes of acute onset apraxia, aphasia, dysarthria, agnosia, amnesia, paralysis, loss of coordination, or loss of consciousness  Medication Review  Turmeric, acetaminophen , amLODipine , atenolol, betamethasone dipropionate, calcium  carbonate, co-enzyme Q-10, esomeprazole , etanercept, ezetimibe , fexofenadine, fluticasone , fluticasone -salmeterol, gabapentin , losartan , montelukast , multivitamin  with minerals, and  valACYclovir  History Review  Allergy: Ms. Ridley is allergic to atorvastatin , sulfamethoxazole-trimethoprim, and codeine. Drug: Ms. Teal  reports no history of drug use. Alcohol:  reports current alcohol use of about 2.0 - 6.0 standard drinks of alcohol per week. Tobacco:  reports that she has never smoked. She has never used smokeless tobacco. Social: Ms. Pless  reports that she has never smoked. She has never used smokeless tobacco. She reports current alcohol use of about 2.0 - 6.0 standard drinks of alcohol per week. She reports that she does not use drugs. Medical:  has a past medical history of Allergy, COVID-19 (12/15/2018), Flank lipoma (08/17/2016), GERD (gastroesophageal reflux disease), Hypertension, Neck mass (04/07/2013), Psoriatic arthritis (HCC), Recurrent cellulitis, Sleep apnea, and T2DM (type 2 diabetes mellitus) (HCC) (10/01/2018). Surgical: Ms. Desjarlais  has a past surgical history that includes Supracervical abdominal hysterectomy (04/30/1998); lipoma removed (04/30/2012); Colonoscopy (04/30/2013); Neck mass excision (04/28/2014); Tubal ligation (1981); and Abdominal hysterectomy (2000). Family: family history includes Arrhythmia in her nephew; Arthritis in her maternal grandmother; Atrial fibrillation in her brother and mother; Cancer in her father; Hypertension in her mother; Ovarian cancer in her paternal aunt; Supraventricular tachycardia in her brother.  Laboratory Chemistry Profile   Renal Lab Results  Component Value Date   BUN 11 03/19/2023   CREATININE 0.66 03/19/2023   BCR 17 03/19/2023   GFRAA 94 05/17/2020   GFRNONAA 81 05/17/2020    Hepatic Lab Results  Component Value Date   AST 23 03/19/2023   ALT 22 03/19/2023   ALBUMIN 4.5 03/19/2023   ALKPHOS 73 03/19/2023    Electrolytes Lab Results  Component Value Date   NA 138 03/19/2023   K 4.7 03/19/2023   CL 98 03/19/2023   CALCIUM  9.9 03/19/2023   MG 2.1 01/03/2017    Bone Lab Results  Component  Value Date   VD25OH 47.6 03/07/2022   25OHVITD1 50 01/03/2017   25OHVITD2 <1.0 01/03/2017   25OHVITD3 50 01/03/2017    Inflammation (CRP: Acute Phase) (ESR: Chronic Phase) Lab Results  Component Value Date   CRP 0.8 01/03/2017   ESRSEDRATE 2 01/25/2020         Note: Above Lab results reviewed.  Recent Imaging Review  DG Foot Complete Right Please see detailed radiograph report in office note. Note: Reviewed        Physical Exam  General appearance: Well nourished, well developed, and well hydrated. In no apparent acute distress Mental status: Alert, oriented x 3 (person, place, & time)       Respiratory: No evidence of acute respiratory distress Eyes: PERLA Vitals: BP (!) 140/68 (Cuff Size: Normal)   Pulse 67   Temp 98.2 F (36.8 C) (Temporal)   Resp 16   Ht 5\' 2"  (1.575 m)   Wt 125 lb (56.7 kg)   SpO2 98%   BMI 22.86 kg/m  BMI: Estimated body mass index is 22.86 kg/m as calculated from the following:   Height as of this encounter: 5\' 2"  (1.575 m).   Weight as of this encounter: 125 lb (56.7 kg). Ideal: Ideal body weight: 50.1 kg (110 lb 7.2 oz) Adjusted ideal body weight: 52.7 kg (116 lb 4.3 oz)  Assessment   Diagnosis Status  1. Chronic low back pain (1ry area of Pain) (Bilateral) (R>L) w/ sciatica (Bilateral)   2. Chronic lower extremity pain (2ry area of Pain) (Bilateral) (R>L)   3. Lumbosacral radiculopathy at L5 (Right)   4. Lumbosacral radiculopathy (Right)  5. Postop check    Controlled Controlled Controlled   Updated Problems: No problems updated.  Plan of Care  Problem-specific:  Assessment and Plan    Nerve root irritation, primarily S1   Symptoms indicate S1 nerve root irritation with pain in the right lower extremity, including the buttock, calf, and bottom of the foot. Previous treatments targeted the L4-5 level, but current symptoms require focusing on S1. She is apprehensive about surgery and prefers non-surgical management. Order a CT  scan to evaluate the S1 nerve root and schedule a caudal epidural steroid injection targeting the S1 nerve root on the right side.  Bilateral low back pain and bilateral lower extremity pain   Chronic bilateral low back pain and lower extremity pain, mainly on the right side, suggest S1 nerve root involvement. Previous L4-5 lumbar epidural steroid injections provided partial relief. Pain is present in the right buttock, calf, and bottom of the foot. She has no history of back surgery and reports no significant weakness. Due to claustrophobia, she prefers to avoid MRI, limiting imaging options. Order a CT scan to evaluate the lumbar spine and assess S1 nerve root involvement. Schedule a caudal epidural steroid injection targeting the S1 nerve root on the right side.  Degenerative arthritis   Degenerative arthritis contributes to her overall pain and discomfort. She is concerned about degenerative changes and their impact on her condition.       Ms. Jacqulyn Amesquita has a current medication list which includes the following long-term medication(s): amlodipine , atenolol, calcium  carbonate, enbrel sureclick, esomeprazole , ezetimibe , fexofenadine, fluticasone , gabapentin , losartan , montelukast , fluticasone -salmeterol, and fluticasone -salmeterol.  Pharmacotherapy (Medications Ordered): No orders of the defined types were placed in this encounter.  Orders:  Orders Placed This Encounter  Procedures   Caudal Epidural Injection    Standing Status:   Future    Expiration Date:   12/04/2023    Scheduling Instructions:     Laterality: Right-sided     Level(s): Sacrococcygeal canal (Tailbone area)     Sedation: With Sedation     Scheduling Timeframe: As soon as pre-approved    Where will this procedure be performed?:   [facility]   CT LUMBAR SPINE WO CONTRAST    Patient presents with axial pain with possible radicular component. Please assist us  in identifying specific level(s) and laterality of  any additional findings such as: 1. Facet (Zygapophyseal) joint DJD (Hypertrophy, space narrowing, subchondral sclerosis, and/or osteophyte formation) 2. DDD and/or IVDD (Loss of disc height, desiccation, gas patterns, osteophytes, endplate sclerosis, or "Black disc disease") 3. Pars defects 4. Spondylolisthesis, spondylosis, and/or spondyloarthropathies (include Degree/Grade of displacement in mm) (stability) 5. Vertebral body Fractures (acute/chronic) (state percentage of collapse) 6. Demineralization (osteopenia/osteoporotic) 7. Bone pathology 8. Foraminal narrowing  9. Surgical changes 10. Central, Lateral Recess, and/or Foraminal Stenosis (include AP diameter of stenosis in mm) 11. Surgical changes (hardware type, status, and presence of fibrosis) 12. Modic Type Changes (MRI only) 13. IVDD (Disc bulge, protrusion, herniation, extrusion) (Level, laterality, extent)  Medical necessity: Imaging study necessary to evaluate for possible degenerative disease responsible for neurologic dysfunction that may be caused by compression or inflammation of CNS and to help plan for interventional therapy or surgery, such as decompression of a pinched nerve or spinal fusion.    Standing Status:   Future    Expiration Date:   12/04/2023    Scheduling Instructions:     Please make sure that the patient understands that this needs to be done as soon as  possible. Never have the patient do the imaging "just before the next appointment". Inform patient that having the imaging done within the [facility] will expedite the availability of the results and will provide      imaging availability to the requesting physician. In addition inform the patient that the imaging order has an expiration date and will not be renewed if not done within the active period.    Preferred imaging location?:   [facility]    Call Results- Best Contact Number?:   [phone] [place] [facility]    Radiology Contrast Protocol - do NOT remove file path:   [email or web address]    Release to patient:   Immediate [1]   Nursing Instructions:    Please complete this patient's postprocedure evaluation.    Scheduling Instructions:     Please complete this patient's postprocedure evaluation.   Follow-up plan:   Return for (ECT): (R) Caudal ESI #1 (after CT-scan).     Interventional Therapies  Risk Factors  Considerations:  BCBS[employer] DENIED L-MRI (02/17/19)    Planned  Pending:   Diagnostic/therapeutic right caudal ESI #1 Therapeutic right L4-5 LESI #2    Under consideration:   Therapeutic right lumbar facet RFA #2  Therapeutic left lumbar facet RFA #2  Therapeutic right L4-5 LESI #2    Completed:   Diagnostic/therapeutic right L4-5 LESI x1 (01/22/2019) (50/50/90 x2 weeks)  Therapeutic bilateral lumbar facet MBB x7 (04/10/2022) (100/100/95/90-100/duration of up to 8 months)  Therapeutic bilateral SI Blk x4 (11/09/2021) (100/100/80) (100/100/100/50)  Therapeutic right (L4-5, L5-S1) lumbar facet RFA x2 (07/10/2022) (100/100/75/75) (first 1 lasted 3 years)  Therapeutic left lumbar facet RFA x1 (08/06/2019) (100/100/75/75)  Therapeutic right lumbar facet RFA x1 (07/10/2022) ( (100/100/75/75)  Therapeutic right SI RFA x1 (03/17/2020) (100/100/100/90-100)  Therapeutic left SI RFA x1 (03/31/2020) (100/100/100 x3 weeks/70)    Completed by other providers:      Therapeutic  Palliative (PRN) options:   Palliative lumbar facet MBB  Therapeutic lumbar facet RFA  Therapeutic SI RFA  Palliative SI joint Blk  Therapeutic L4-5 LESI      Recent Visits Date Type Provider Dept  09/03/23 Office Visit Renaldo Caroli, MD [facility]  08/01/23 Procedure visit Renaldo Caroli, MD [facility]  07/15/23 Office Visit Renaldo Caroli, MD [facility]  Showing recent visits within past 90 days and meeting all other  requirements Future Appointments Date Type Provider Dept  09/24/23 Appointment Renaldo Caroli, MD [facility]  Showing future appointments within next 90 days and meeting all other requirements  I discussed the assessment and treatment plan with the patient. The patient was provided an opportunity to ask questions and all were answered. The patient agreed with the plan and demonstrated an understanding of the instructions.  Patient advised to call back or seek an in-person evaluation if the symptoms or condition worsens.  Duration of encounter: 41 minutes.  Total time on encounter, as per AMA guidelines included both the face-to-face and non-face-to-face time personally spent by the physician and/or other qualified health care professional(s) on the day of the encounter (includes time in activities that require the physician or other qualified health care professional and does not include time in activities normally performed by clinical staff). Physician's time may include the following activities when performed: Preparing to see the patient (e.g., pre-charting review of records, searching for previously ordered imaging, lab work, and nerve conduction tests) Review of prior analgesic pharmacotherapies. Reviewing PMP  Interpreting ordered tests (e.g., lab work, imaging, nerve conduction tests) Performing post-procedure evaluations, including interpretation of diagnostic procedures Obtaining and/or reviewing separately obtained history Performing a medically appropriate examination and/or evaluation Counseling and educating the patient/family/caregiver Ordering medications, tests, or procedures Referring and communicating with other health care professionals (when not separately reported) Documenting clinical information in the electronic or other health record Independently interpreting results (not separately reported) and communicating results to the patient/  family/caregiver Care coordination (not separately reported)  Note by: Candi Chafe, MD (TTS and AI technology used. I apologize for any typographical errors that were not detected and corrected.) Date: 09/03/2023; Time: 5:57 AM

## 2023-09-05 ENCOUNTER — Ambulatory Visit
Admission: RE | Admit: 2023-09-05 | Discharge: 2023-09-05 | Disposition: A | Discharge status: Home / Self Care | Source: Ambulatory Visit | Attending: Pain Medicine | Admitting: Pain Medicine

## 2023-09-05 DIAGNOSIS — M5417 Radiculopathy, lumbosacral region: Secondary | ICD-10-CM | POA: Insufficient documentation

## 2023-09-05 DIAGNOSIS — M79605 Pain in left leg: Secondary | ICD-10-CM | POA: Insufficient documentation

## 2023-09-05 DIAGNOSIS — M79604 Pain in right leg: Secondary | ICD-10-CM | POA: Insufficient documentation

## 2023-09-05 DIAGNOSIS — M4187 Other forms of scoliosis, lumbosacral region: Secondary | ICD-10-CM | POA: Diagnosis not present

## 2023-09-05 DIAGNOSIS — M48061 Spinal stenosis, lumbar region without neurogenic claudication: Secondary | ICD-10-CM | POA: Diagnosis not present

## 2023-09-05 DIAGNOSIS — G8929 Other chronic pain: Secondary | ICD-10-CM | POA: Diagnosis not present

## 2023-09-05 DIAGNOSIS — M5442 Lumbago with sciatica, left side: Secondary | ICD-10-CM | POA: Insufficient documentation

## 2023-09-05 DIAGNOSIS — M5441 Lumbago with sciatica, right side: Secondary | ICD-10-CM | POA: Diagnosis not present

## 2023-09-05 DIAGNOSIS — M47816 Spondylosis without myelopathy or radiculopathy, lumbar region: Secondary | ICD-10-CM | POA: Diagnosis not present

## 2023-09-05 DIAGNOSIS — M4807 Spinal stenosis, lumbosacral region: Secondary | ICD-10-CM | POA: Diagnosis not present

## 2023-09-10 ENCOUNTER — Ambulatory Visit

## 2023-09-10 DIAGNOSIS — M9901 Segmental and somatic dysfunction of cervical region: Secondary | ICD-10-CM | POA: Diagnosis not present

## 2023-09-10 DIAGNOSIS — M5431 Sciatica, right side: Secondary | ICD-10-CM | POA: Diagnosis not present

## 2023-09-10 DIAGNOSIS — M9903 Segmental and somatic dysfunction of lumbar region: Secondary | ICD-10-CM | POA: Diagnosis not present

## 2023-09-10 DIAGNOSIS — M5417 Radiculopathy, lumbosacral region: Secondary | ICD-10-CM | POA: Diagnosis not present

## 2023-09-12 ENCOUNTER — Ambulatory Visit: Payer: Medicare Other | Admitting: Pulmonary Disease

## 2023-09-16 ENCOUNTER — Encounter: Payer: Self-pay | Admitting: Family Medicine

## 2023-09-16 ENCOUNTER — Ambulatory Visit: Payer: Self-pay | Admitting: Family Medicine

## 2023-09-16 VITALS — BP 136/80 | HR 61 | Temp 98.2°F | Wt 145.4 lb

## 2023-09-16 DIAGNOSIS — K219 Gastro-esophageal reflux disease without esophagitis: Secondary | ICD-10-CM

## 2023-09-16 DIAGNOSIS — E1159 Type 2 diabetes mellitus with other circulatory complications: Secondary | ICD-10-CM | POA: Diagnosis not present

## 2023-09-16 DIAGNOSIS — E1169 Type 2 diabetes mellitus with other specified complication: Secondary | ICD-10-CM

## 2023-09-16 DIAGNOSIS — E038 Other specified hypothyroidism: Secondary | ICD-10-CM

## 2023-09-16 DIAGNOSIS — L4050 Arthropathic psoriasis, unspecified: Secondary | ICD-10-CM

## 2023-09-16 DIAGNOSIS — E785 Hyperlipidemia, unspecified: Secondary | ICD-10-CM

## 2023-09-16 DIAGNOSIS — N3946 Mixed incontinence: Secondary | ICD-10-CM

## 2023-09-16 DIAGNOSIS — I152 Hypertension secondary to endocrine disorders: Secondary | ICD-10-CM

## 2023-09-16 MED ORDER — OXYBUTYNIN CHLORIDE ER 5 MG PO TB24: 5.0000 mg | ORAL_TABLET | Freq: Every day | ORAL | 2 refills | Status: DC

## 2023-09-16 NOTE — Progress Notes (Signed)
 Established Patient Office Visit  Subjective   Patient ID: Ashley Rodriguez, female    DOB: [birth year]  Age: 73 y.o. MRN: [id number]  Chief Complaint  Patient presents with   Diabetes    Ashley Rodriguez is a 73 y/o female with history of hypertension, hyperlipidemia, and T2DM here for follow-up and chronic disease management. She is having some continued issues with her stress incontinence and is also feeling some urgency. This is particularly concerning when she is golfing where she is experiencing some leakage with her golf swing. She is drinking a good amount of water and is also drinking apple cider vinegar "wellness drinks". She is not having any problems with her medications and her GERD has improved since she is back on her Nexium . In terms of her shortness of breath on exertion, she saw pulmonology in January who recommended ICS but did not wish to trial the inhalers due to both cost and personal preference. She also has some new left arm pain which started 3 weeks ago when she started to golf more frequently.   Diabetes Pertinent negatives for hypoglycemia include no dizziness or headaches. Pertinent negatives for diabetes include no chest pain and no weight loss.    Past Medical History:  Diagnosis Date   Allergy    COVID-19 12/15/2018   Flank lipoma 08/17/2016   GERD (gastroesophageal reflux disease)    Hypertension    Neck mass 04/07/2013   Psoriatic arthritis (HCC)    Recurrent cellulitis    Sleep apnea    T2DM (type 2 diabetes mellitus) (HCC) 10/01/2018   Past Surgical History:  Procedure Laterality Date   ABDOMINAL HYSTERECTOMY  2000   COLONOSCOPY  04/30/2013   lipoma removed  04/30/2012   on neck   NECK MASS EXCISION  04/28/2014   lipoma   SUPRACERVICAL ABDOMINAL HYSTERECTOMY  04/30/1998   with BSO   TUBAL LIGATION  1981      Review of Systems  Constitutional:  Negative for chills, fever and weight loss.  HENT:  Positive for tinnitus. Negative for ear discharge,  ear pain and nosebleeds.   Eyes: Negative.   Respiratory:  Positive for shortness of breath. Negative for cough, hemoptysis and wheezing.   Cardiovascular:  Negative for chest pain, palpitations and leg swelling.  Gastrointestinal:  Negative for abdominal pain, blood in stool, heartburn, nausea and vomiting.  Genitourinary:  Positive for urgency. Negative for frequency and hematuria.  Musculoskeletal:  Positive for back pain.  Skin: Negative.   Neurological:  Negative for dizziness, tingling and headaches.  Psychiatric/Behavioral:  Negative for depression.       Objective:     BP 136/80 (BP Location: Right Arm, Patient Position: Sitting, Cuff Size: Normal)   Pulse 61   Temp 98.2 F (36.8 C) (Oral)   Wt 145 lb 6.4 oz (66 kg)   SpO2 97%   BMI 26.59 kg/m  BP Readings from Last 3 Encounters:  09/16/23 136/80  09/03/23 (!) 140/68  08/01/23 (!) 151/73   Wt Readings from Last 3 Encounters:  09/16/23 145 lb 6.4 oz (66 kg)  09/03/23 125 lb (56.7 kg)  08/01/23 145 lb (65.8 kg)      Physical Exam Constitutional:      Appearance: Normal appearance. She is normal weight.  HENT:     Head: Normocephalic and atraumatic.     Right Ear: External ear normal.     Left Ear: External ear normal.     Nose: Nose normal.     Mouth/Throat:  Mouth: Mucous membranes are moist.     Pharynx: Oropharynx is clear. No oropharyngeal exudate.  Eyes:     General: No scleral icterus.    Extraocular Movements: Extraocular movements intact.     Pupils: Pupils are equal, round, and reactive to light.  Cardiovascular:     Rate and Rhythm: Normal rate and regular rhythm.     Pulses: Normal pulses.     Heart sounds: No murmur heard.    No friction rub. No gallop.  Pulmonary:     Effort: Pulmonary effort is normal. No respiratory distress.     Breath sounds: Normal breath sounds. No wheezing.  Abdominal:     General: Abdomen is flat. There is no distension.     Palpations: Abdomen is soft. There  is no mass.     Tenderness: There is no abdominal tenderness. There is no guarding or rebound.  Musculoskeletal:        General: No swelling or tenderness. Normal range of motion.     Cervical back: Normal range of motion and neck supple. No rigidity.     Right lower leg: No edema.     Left lower leg: No edema.  Skin:    General: Skin is warm and dry.     Coloration: Skin is not jaundiced or pale.     Findings: No erythema.  Neurological:     General: No focal deficit present.     Mental Status: She is alert and oriented to person, place, and time.     Motor: No weakness.  Psychiatric:        Mood and Affect: Mood normal.        Behavior: Behavior normal.     No results found for any visits on 09/16/23.  Last metabolic panel Lab Results  Component Value Date   GLUCOSE 115 (H) 03/19/2023   NA 138 03/19/2023   K 4.7 03/19/2023   CL 98 03/19/2023   CO2 26 03/19/2023   BUN 11 03/19/2023   CREATININE 0.66 03/19/2023   EGFR 94 03/19/2023   CALCIUM  9.9 03/19/2023   PROT 6.9 03/19/2023   ALBUMIN 4.5 03/19/2023   LABGLOB 2.4 03/19/2023   AGRATIO 1.9 09/18/2022   BILITOT 0.5 03/19/2023   ALKPHOS 73 03/19/2023   AST 23 03/19/2023   ALT 22 03/19/2023   Last lipids Lab Results  Component Value Date   CHOL 195 03/19/2023   HDL 47 03/19/2023   LDLCALC 118 (H) 03/19/2023   TRIG 169 (H) 03/19/2023   CHOLHDL 4.1 09/18/2022   Last hemoglobin A1c Lab Results  Component Value Date   HGBA1C 6.3 (H) 03/19/2023   Last thyroid  functions Lab Results  Component Value Date   TSH 3.090 03/19/2023      The 10-year ASCVD risk score (Arnett DK, et al., 2019) is: 26%    Assessment & Plan:   Problem List Items Addressed This Visit       Cardiovascular and Mediastinum   Hypertension associated with diabetes (HCC) - Primary   Hypertension is currently well managed with losartan  12.5 mg daily, amlodipine  5 mg daily, and atenolol 50 mg daily.  - Continue current antihypertensive  medications        Relevant Orders   Comprehensive metabolic panel with GFR     Digestive   GERD (gastroesophageal reflux disease)   Patient has restarted her nexium  which has been helpful in controlling her symptoms. She feels that this is stable and has no current complaints with  this. -Continue Nexium         Endocrine   T2DM (type 2 diabetes mellitus) (HCC)   She as not had any recent hypoglycemic episodes, vision changes, or new paresthesias since her last visit. She did as of November discontinue Ozempic  secondary to nausea impacting her quality of life. - Check A1c to see how her sugars have been since discontinuing Ozempic        Relevant Orders   Urine Microalbumin w/creat. ratio   HgB A1c   Hyperlipidemia associated with type 2 diabetes mellitus (HCC)   Hyperlipidemia managed with Zetia  10 mg daily. Most recent LDL was under 130. - Check cholesterol levels to see how the ezetimibe  has been doing for her.        Relevant Orders   Comprehensive metabolic panel with GFR   Lipid Panel With LDL/HDL Ratio   Subclinical hypothyroidism   Patient describes feeling cold more regularly despite having her house kept in the 70s during the summer.  -Continue to monitor TSH + t4      Relevant Orders   TSH + free T4     Musculoskeletal and Integument   Psoriatic arthritis (HCC) (Chronic)   Followed by rheumatology.        Other   Stress incontinence   Patient spoke with urogyn but did not ultimately wish to proceed with urethral sling surgery. She is now also having some urgency symptoms and wants to try some medical therapy to see if this improves her urgency. She is still having stress issues, especially when golfing and this is causing her distress. -Trial Oxybutynin  -referral to pelvic floor PT to see if her stress incontinence can be improved       Relevant Medications   oxybutynin  (DITROPAN -XL) 5 MG 24 hr tablet    Return in about 6 months (around 03/18/2024)  for AWV, CPE.    Monda Angry, Medical Student   Patient seen along with [employer] student, Luther Saltness. I personally evaluated this patient along with the student, and verified all aspects of the history, physical exam, and medical decision making as documented by the student. I agree with the student's documentation and have made all necessary edits.  Delshawn Stech, Stan Eans, MD, MPH [facility]

## 2023-09-16 NOTE — Assessment & Plan Note (Signed)
 Patient describes feeling cold more regularly despite having her house kept in the 70s during the summer.  -Continue to monitor TSH + t4

## 2023-09-16 NOTE — Assessment & Plan Note (Signed)
 Hyperlipidemia managed with Zetia  10 mg daily. Most recent LDL was under 130. - Check cholesterol levels to see how the ezetimibe  has been doing for her.

## 2023-09-16 NOTE — Assessment & Plan Note (Signed)
 Followed by rheumatology.

## 2023-09-16 NOTE — Assessment & Plan Note (Signed)
 Patient spoke with urogyn but did not ultimately wish to proceed with urethral sling surgery. She is now also having some urgency symptoms and wants to try some medical therapy to see if this improves her urgency. She is still having stress issues, especially when golfing and this is causing her distress. -Trial Oxybutynin  -referral to pelvic floor PT to see if her stress incontinence can be improved

## 2023-09-16 NOTE — Assessment & Plan Note (Signed)
 Hypertension is currently well managed with losartan  12.5 mg daily, amlodipine  5 mg daily, and atenolol 50 mg daily.  - Continue current antihypertensive medications

## 2023-09-16 NOTE — Assessment & Plan Note (Signed)
 She as not had any recent hypoglycemic episodes, vision changes, or new paresthesias since her last visit. She did as of November discontinue Ozempic  secondary to nausea impacting her quality of life. - Check A1c to see how her sugars have been since discontinuing Ozempic 

## 2023-09-16 NOTE — Assessment & Plan Note (Signed)
 Patient has restarted her nexium  which has been helpful in controlling her symptoms. She feels that this is stable and has no current complaints with this. -Continue Nexium 

## 2023-09-17 ENCOUNTER — Ambulatory Visit: Payer: Self-pay | Admitting: Family Medicine

## 2023-09-17 LAB — HEMOGLOBIN A1C
Est. average glucose Bld gHb Est-mCnc: 131 mg/dL
Hgb A1c MFr Bld: 6.2 % — ABNORMAL HIGH (ref 4.8–5.6)

## 2023-09-17 LAB — LIPID PANEL WITH LDL/HDL RATIO
Cholesterol, Total: 209 mg/dL — ABNORMAL HIGH (ref 100–199)
HDL: 50 mg/dL (ref >39)
LDL Chol Calc (NIH): 129 mg/dL — ABNORMAL HIGH (ref 0–99)
LDL/HDL Ratio: 2.6 ratio (ref 0.0–3.2)
Triglycerides: 170 mg/dL — ABNORMAL HIGH (ref 0–149)
VLDL Cholesterol Cal: 30 mg/dL (ref 5–40)

## 2023-09-17 LAB — COMPREHENSIVE METABOLIC PANEL WITH GFR
ALT: 20 IU/L (ref 0–32)
AST: 26 IU/L (ref 0–40)
Albumin: 4.7 g/dL (ref 3.9–4.9)
Alkaline Phosphatase: 81 IU/L (ref 44–121)
BUN/Creatinine Ratio: 13 (ref 12–28)
BUN: 9 mg/dL (ref 8–27)
Bilirubin Total: 0.7 mg/dL (ref 0.0–1.2)
CO2: 23 mmol/L (ref 20–29)
Calcium: 9.8 mg/dL (ref 8.7–10.3)
Chloride: 100 mmol/L (ref 96–106)
Creatinine, Ser: 0.67 mg/dL (ref 0.57–1.00)
Globulin, Total: 2.6 g/dL (ref 1.5–4.5)
Glucose: 126 mg/dL — ABNORMAL HIGH (ref 70–99)
Potassium: 5.1 mmol/L (ref 3.5–5.2)
Sodium: 139 mmol/L (ref 134–144)
Total Protein: 7.3 g/dL (ref 6.0–8.5)
eGFR: 94 mL/min/{1.73_m2} (ref >59)

## 2023-09-17 LAB — MICROALBUMIN / CREATININE URINE RATIO
Creatinine, Urine: 246.3 mg/dL
Microalb/Creat Ratio: 19 mg/g{creat} (ref 0–29)
Microalbumin, Urine: 47.3 ug/mL

## 2023-09-17 LAB — TSH+FREE T4
Free T4: 0.99 ng/dL (ref 0.82–1.77)
TSH: 2.3 u[IU]/mL (ref 0.450–4.500)

## 2023-09-18 ENCOUNTER — Ambulatory Visit: Admitting: Pain Medicine

## 2023-09-18 ENCOUNTER — Telehealth: Payer: Self-pay

## 2023-09-18 NOTE — Telephone Encounter (Signed)
 Telephone call from patient stating that she has an appointment on 09/19/23. However her CT results are not back. Patient wants to know if she should reschedule her appointment?

## 2023-09-19 ENCOUNTER — Encounter: Payer: Self-pay | Admitting: Pain Medicine

## 2023-09-19 ENCOUNTER — Ambulatory Visit: Attending: Pain Medicine | Admitting: Pain Medicine

## 2023-09-19 ENCOUNTER — Ambulatory Visit
Admission: RE | Admit: 2023-09-19 | Discharge: 2023-09-19 | Disposition: A | Discharge status: Home / Self Care | Source: Ambulatory Visit | Attending: Pain Medicine | Admitting: Pain Medicine

## 2023-09-19 VITALS — BP 132/86 | HR 63 | Temp 97.0°F | Resp 18 | Ht 62.0 in | Wt 144.0 lb

## 2023-09-19 DIAGNOSIS — M1991 Primary osteoarthritis, unspecified site: Secondary | ICD-10-CM | POA: Insufficient documentation

## 2023-09-19 DIAGNOSIS — R937 Abnormal findings on diagnostic imaging of other parts of musculoskeletal system: Secondary | ICD-10-CM | POA: Insufficient documentation

## 2023-09-19 DIAGNOSIS — R94131 Abnormal electromyogram [EMG]: Secondary | ICD-10-CM | POA: Diagnosis not present

## 2023-09-19 DIAGNOSIS — M5417 Radiculopathy, lumbosacral region: Secondary | ICD-10-CM | POA: Diagnosis not present

## 2023-09-19 DIAGNOSIS — G8929 Other chronic pain: Secondary | ICD-10-CM | POA: Diagnosis not present

## 2023-09-19 DIAGNOSIS — D75839 Thrombocytosis, unspecified: Secondary | ICD-10-CM | POA: Insufficient documentation

## 2023-09-19 DIAGNOSIS — M4317 Spondylolisthesis, lumbosacral region: Secondary | ICD-10-CM | POA: Insufficient documentation

## 2023-09-19 DIAGNOSIS — M545 Low back pain, unspecified: Secondary | ICD-10-CM | POA: Diagnosis not present

## 2023-09-19 DIAGNOSIS — M4310 Spondylolisthesis, site unspecified: Secondary | ICD-10-CM | POA: Insufficient documentation

## 2023-09-19 DIAGNOSIS — M5441 Lumbago with sciatica, right side: Secondary | ICD-10-CM | POA: Diagnosis not present

## 2023-09-19 DIAGNOSIS — M51372 Other intervertebral disc degeneration, lumbosacral region with discogenic back pain and lower extremity pain: Secondary | ICD-10-CM | POA: Diagnosis not present

## 2023-09-19 DIAGNOSIS — M4726 Other spondylosis with radiculopathy, lumbar region: Secondary | ICD-10-CM | POA: Insufficient documentation

## 2023-09-19 DIAGNOSIS — M79604 Pain in right leg: Secondary | ICD-10-CM | POA: Insufficient documentation

## 2023-09-19 DIAGNOSIS — M5416 Radiculopathy, lumbar region: Secondary | ICD-10-CM | POA: Diagnosis not present

## 2023-09-19 DIAGNOSIS — M79605 Pain in left leg: Secondary | ICD-10-CM | POA: Insufficient documentation

## 2023-09-19 DIAGNOSIS — M5432 Sciatica, left side: Secondary | ICD-10-CM | POA: Insufficient documentation

## 2023-09-19 DIAGNOSIS — M5442 Lumbago with sciatica, left side: Secondary | ICD-10-CM | POA: Insufficient documentation

## 2023-09-19 DIAGNOSIS — Z79899 Other long term (current) drug therapy: Secondary | ICD-10-CM | POA: Insufficient documentation

## 2023-09-19 MED ORDER — LIDOCAINE HCL 2 % IJ SOLN
20.0000 mL | Freq: Once | INTRAMUSCULAR | Status: AC
  Administered 2023-09-19: 200 mg
  Filled 2023-09-19: qty 40

## 2023-09-19 MED ORDER — PENTAFLUOROPROP-TETRAFLUOROETH EX AERO: INHALATION_SPRAY | Freq: Once | CUTANEOUS | Status: DC

## 2023-09-19 MED ORDER — MIDAZOLAM HCL 2 MG/2ML IJ SOLN
0.5000 mg | Freq: Once | INTRAMUSCULAR | Status: AC
Start: 2023-09-19 — End: 2023-09-19
  Administered 2023-09-19: 2 mg via INTRAVENOUS
  Filled 2023-09-19: qty 2

## 2023-09-19 MED ORDER — SODIUM CHLORIDE (PF) 0.9 % IJ SOLN
INTRAMUSCULAR | Status: AC
  Filled 2023-09-19: qty 10

## 2023-09-19 MED ORDER — IOHEXOL 180 MG/ML  SOLN
10.0000 mL | Freq: Once | INTRAMUSCULAR | Status: AC
Start: 2023-09-19 — End: 2023-09-19
  Administered 2023-09-19: 10 mL via EPIDURAL
  Filled 2023-09-19: qty 20

## 2023-09-19 MED ORDER — ROPIVACAINE HCL 2 MG/ML IJ SOLN
2.0000 mL | Freq: Once | INTRAMUSCULAR | Status: AC
  Administered 2023-09-19: 2 mL via EPIDURAL
  Filled 2023-09-19: qty 20

## 2023-09-19 MED ORDER — SODIUM CHLORIDE 0.9% FLUSH
2.0000 mL | Freq: Once | INTRAVENOUS | Status: AC
  Administered 2023-09-19: 2 mL

## 2023-09-19 MED ORDER — TRIAMCINOLONE ACETONIDE 40 MG/ML IJ SUSP
40.0000 mg | Freq: Once | INTRAMUSCULAR | Status: AC
Start: 2023-09-19 — End: 2023-09-19
  Administered 2023-09-19: 40 mg
  Filled 2023-09-19: qty 1

## 2023-09-19 NOTE — Progress Notes (Signed)
 Safety precautions to be maintained throughout the outpatient stay will include: orient to surroundings, keep bed in low position, maintain call bell within reach at all times, provide assistance with transfer out of bed and ambulation.

## 2023-09-19 NOTE — Patient Instructions (Signed)

## 2023-09-19 NOTE — Progress Notes (Signed)
 PROVIDER NOTE: Interpretation of information contained herein should be left to medically-trained personnel. Specific patient instructions are provided elsewhere under "Patient Instructions" section of medical record. This document was created in part using STT-dictation technology, any transcriptional errors that may result from this process are unintentional.  Patient: Ashley Rodriguez Type: Established DOB: Jun 20, 1950 MRN: [id number] PCP: Mazie Speed, MD  Service: Procedure DOS: 09/19/2023 Setting: Ambulatory Location: Ambulatory outpatient facility Delivery: Face-to-face Provider: Candi Chafe, MD Specialty: Interventional Pain Management Specialty designation: 09 Location: Outpatient facility Ref. Prov.: Renaldo Caroli, MD       Interventional Therapy   Type: Caudal Epidural Steroid Injection   #1  Laterality: Midline aiming right Level: Sacrococcygeal ligament  Imaging: Fluoroscopy-guided Spinal ([id number]) Anesthesia: Local anesthesia (1-2% Lidocaine ) Anxiolysis: IV Versed  2.0 mg Sedation: No Sedation                       DOS: 09/19/2023  Performed by: Candi Chafe, MD  Purpose: Diagnostic/Therapeutic Indications: Low back and lower extremity pain severe enough to impact quality of life or function. Rationale (medical necessity): procedure needed and proper for the diagnosis and/or treatment of [name]'s medical symptoms and needs. 1. Chronic low back pain (1ry area of Pain) (Bilateral) (R>L) w/ sciatica (Bilateral)   2. Chronic lower extremity pain (2ry area of Pain) (Bilateral) (R>L)   3. Chronic lower lumbar polyradiculopathy (Right)   4. Degeneration of intervertebral disc of lumbosacral region with discogenic back pain and lower extremity pain   5. Grade 1 Anterolisthesis of L5 over S1   6. L5 pars defect with spondylolisthesis (Bilateral)   7. Lumbar spondylosis w/ polyradiculopathy (Right)   8. Lumbosacral radiculopathy (Right)   9.  Lumbosacral radiculopathy at L5 (Right)   10. Spondylolisthesis of lumbosacral region   11. Low back pain radiating to both legs   12. Abnormal EMG (chronic right lower lumbar polyradiculopathy)   13. Abnormal CT scan, lumbar spine (09/18/2023)    NAS-11 Pain score:   Pre-procedure: 4 /10   Post-procedure: 3 /10     Target: Lumbosacral epidural canal  Location: Epidural space  Region: Caudal canal Approach: Percutaneous  Type of procedure: Epidural block  Position  Prep  Materials:  Position: Prone  Prep solution: ChloraPrep (2% chlorhexidine gluconate and 70% isopropyl alcohol) Prep Area: Entire posterior lumbosacral area  Materials:  Tray: Epidural Needle(s):  Type: Epidural  Gauge (G): 17  Length: 3.5-in  Qty: 1  H&P (Pre-op Assessment):  [name] is a 73 y.o. (year old), female patient, seen today for interventional treatment. She  has a past surgical history that includes Supracervical abdominal hysterectomy (04/30/1998); lipoma removed (04/30/2012); Colonoscopy (04/30/2013); Neck mass excision (04/28/2014); Tubal ligation (1981); and Abdominal hysterectomy (2000). [name] has a current medication list which includes the following prescription(s): acetaminophen , amlodipine , atenolol, betamethasone dipropionate, calcium  carbonate, co-enzyme q-10, enbrel sureclick, esomeprazole , ezetimibe , fexofenadine, fluticasone , gabapentin , losartan , montelukast , multivitamin with minerals, oxybutynin , turmeric, and valacyclovir, and the following Facility-Administered Medications: pentafluoroprop-tetrafluoroeth and triamcinolone  acetonide. Her primarily concern today is the Back Pain (Low right back into right back of right leg into right foot )  Initial Vital Signs:  Pulse/HCG Rate: 63ECG Heart Rate: (!) 56 Temp: (!) 97 F (36.1 C) Resp: 16 BP: 134/68 SpO2: 96 %  BMI: Estimated body mass index is 26.34 kg/m as calculated from the following:   Height as of this encounter: 5\' 2"   (1.575 m).   Weight as of this encounter: 144 lb (65.3  kg).  Risk Assessment: Allergies: Reviewed. She is allergic to atorvastatin , sulfamethoxazole-trimethoprim, and codeine.  Allergy Precautions: None required Coagulopathies: Reviewed. None identified.  Blood-thinner therapy: None at this time Active Infection(s): Reviewed. None identified. [name] is afebrile  Site Confirmation: [name] was asked to confirm the procedure and laterality before marking the site Procedure checklist: Completed Consent: Before the procedure and under the influence of no sedative(s), amnesic(s), or anxiolytics, the patient was informed of the treatment options, risks and possible complications. To fulfill our ethical and legal obligations, as recommended by the American Medical Association's Code of Ethics, I have informed the patient of my clinical impression; the nature and purpose of the treatment or procedure; the risks, benefits, and possible complications of the intervention; the alternatives, including doing nothing; the risk(s) and benefit(s) of the alternative treatment(s) or procedure(s); and the risk(s) and benefit(s) of doing nothing. The patient was provided information about the general risks and possible complications associated with the procedure. These may include, but are not limited to: failure to achieve desired goals, infection, bleeding, organ or nerve damage, allergic reactions, paralysis, and death. In addition, the patient was informed of those risks and complications associated to Spine-related procedures, such as failure to decrease pain; infection (i.e.: Meningitis, epidural or intraspinal abscess); bleeding (i.e.: epidural hematoma, subarachnoid hemorrhage, or any other type of intraspinal or peri-dural bleeding); organ or nerve damage (i.e.: Any type of peripheral nerve, nerve root, or spinal cord injury) with subsequent damage to sensory, motor, and/or autonomic systems, resulting in  permanent pain, numbness, and/or weakness of one or several areas of the body; allergic reactions; (i.e.: anaphylactic reaction); and/or death. Furthermore, the patient was informed of those risks and complications associated with the medications. These include, but are not limited to: allergic reactions (i.e.: anaphylactic or anaphylactoid reaction(s)); adrenal axis suppression; blood sugar elevation that in diabetics may result in ketoacidosis or comma; water retention that in patients with history of congestive heart failure may result in shortness of breath, pulmonary edema, and decompensation with resultant heart failure; weight gain; swelling or edema; medication-induced neural toxicity; particulate matter embolism and blood vessel occlusion with resultant organ, and/or nervous system infarction; and/or aseptic necrosis of one or more joints. Finally, the patient was informed that Medicine is not an exact science; therefore, there is also the possibility of unforeseen or unpredictable risks and/or possible complications that may result in a catastrophic outcome. The patient indicated having understood very clearly. We have given the patient no guarantees and we have made no promises. Enough time was given to the patient to ask questions, all of which were answered to the patient's satisfaction. Ms. Cumbo has indicated that she wanted to continue with the procedure. Attestation: I, the ordering provider, attest that I have discussed with the patient the benefits, risks, side-effects, alternatives, likelihood of achieving goals, and potential problems during recovery for the procedure that I have provided informed consent. Date  Time: 09/19/2023  9:01 AM  Imaging Guidance (Spinal):          Type of Imaging Technique: Fluoroscopy Guidance (Spinal) Indication(s): Fluoroscopy guidance for needle placement to enhance accuracy in procedures requiring precise needle localization for targeted delivery of  medication in or near specific anatomical locations not easily accessible without such real-time imaging assistance. Exposure Time: Please see nurses notes. Contrast: Before injecting any contrast, we confirmed that the patient did not have an allergy to iodine, shellfish, or radiological contrast. Once satisfactory needle placement was completed at the desired level,  radiological contrast was injected. Contrast injected under live fluoroscopy. No contrast complications. See chart for type and volume of contrast used. Fluoroscopic Guidance: I was personally present during the use of fluoroscopy. "Tunnel Vision Technique" used to obtain the best possible view of the target area. Parallax error corrected before commencing the procedure. "Direction-depth-direction" technique used to introduce the needle under continuous pulsed fluoroscopy. Once target was reached, antero-posterior, oblique, and lateral fluoroscopic projection used confirm needle placement in all planes. Images permanently stored in EMR. Interpretation: I personally interpreted the imaging intraoperatively. Adequate needle placement confirmed in multiple planes. Appropriate spread of contrast into desired area was observed. No evidence of afferent or efferent intravascular uptake. No intrathecal or subarachnoid spread observed. Permanent images saved into the patient's record.  Pre-Procedure Preparation:  Monitoring: As per clinic protocol. Respiration, ETCO2, SpO2, BP, heart rate and rhythm monitor placed and checked for adequate function Safety Precautions: Patient was assessed for positional comfort and pressure points before starting the procedure. Time-out: I initiated and conducted the "Time-out" before starting the procedure, as per protocol. The patient was asked to participate by confirming the accuracy of the "Time Out" information. Verification of the correct person, site, and procedure were performed and confirmed by me, the nursing  staff, and the patient. "Time-out" conducted as per Joint Commission's Universal Protocol (UP.01.01.01). Time: 1033 Start Time: 1033 hrs.  Description  Narrative of Procedure:          Start Time: 1033 hrs.  Technical description of procedure:  Safety Precautions: Aspiration looking for blood return was conducted prior to all injections. At no point did we inject any substances, as a needle was being advanced. No attempts were made at seeking any paresthesias. Safe injection practices and needle disposal techniques used. Medications properly checked for expiration dates. SDV (single dose vial) medications used. Description of the Procedure: Protocol guidelines were followed. The patient was placed in position over the fluoroscopy table. The target area was identified and the area prepped in the usual manner. Skin & deeper tissues infiltrated with local anesthetic. Appropriate amount of time allowed to pass for local anesthetics to take effect. The procedure needle was then advanced to the target area. Proper needle placement secured. Negative aspiration confirmed.  Solution injected in intermittent fashion, asking for systemic symptoms every 0.5cc of injectate. The needle/catheter were removed and the area cleansed, making sure to leave some of the prepping solution back to take advantage of its long term bactericidal properties.  Vitals:   09/19/23 1031 09/19/23 1035 09/19/23 1039 09/19/23 1045  BP: (!) 148/69 (!) 145/69 (!) 145/78 132/86  Pulse:      Resp: 18 18 20 18   Temp:    (!) 97 F (36.1 C)  TempSrc:    Temporal  SpO2: 96% 97% 98% 97%  Weight:      Height:         End Time: 1039 hrs.  Post-operative Assessment:  Post-procedure Vital Signs:  Pulse/HCG Rate: 63(!) 58 Temp: (!) 97 F (36.1 C) Resp: 18 BP: 132/86 SpO2: 97 %  EBL: None  Complications: No immediate post-treatment complications observed by team, or reported by patient.  Note: The patient tolerated the entire  procedure well. A repeat set of vitals were taken after the procedure and the patient was kept under observation following institutional policy, for this type of procedure. Post-procedural neurological assessment was performed, showing return to baseline, prior to discharge. The patient was provided with post-procedure discharge instructions, including a section on how  to identify potential problems. Should any problems arise concerning this procedure, the patient was given instructions to immediately contact us , at any time, without hesitation. In any case, we plan to contact the patient by telephone for a follow-up status report regarding this interventional procedure.  Comments:  No additional relevant information.  Plan of Care (POC)  Orders:  Orders Placed This Encounter  Procedures   Caudal Epidural Injection    Scheduling Instructions:     Laterality: Right-sided     Level(s): Sacrococcygeal canal (Tailbone area)     Sedation: Patient's choice     Timeframe: Today    Where will this procedure be performed?:   [facility]   DG [facility] C-ARM 1-60 MIN NO REPORT    Intraoperative interpretation by procedural physician at [facility].    Standing Status:   Standing    Number of Occurrences:   1    Reason for exam::   Assistance in needle guidance and placement for procedures requiring needle placement in or near specific anatomical locations not easily accessible without such assistance.   Informed Consent Details: Physician/Practitioner Attestation; Transcribe to consent form and obtain patient signature    Nursing Order: Transcribe to consent form and obtain patient signature. Note: Always confirm laterality of pain with Ms. Britta Candy, before procedure.    Physician/Practitioner attestation of informed consent for procedure/surgical case:   I, the physician/practitioner, attest that I have discussed with the patient the benefits, risks, side effects, alternatives,  likelihood of achieving goals and potential problems during recovery for the procedure that I have provided informed consent.    Procedure:   Caudal epidural steroid injection    Physician/Practitioner performing the procedure:   Rakeen Gaillard A. Barth Borne, MD    Indication/Reason:   Low back pain and lower extremity pain secondary to lumbosacral radiculitis   Provide equipment / supplies at bedside    Procedural tray: Epidural Tray (Disposable  single use) Skin infiltration needle: Regular 1.5-in, 25-G, (x1) Block needle size: Regular standard Catheter: No catheter required    Standing Status:   Standing    Number of Occurrences:   1    Specify:   Epidural Tray   Saline lock IV    Have LR [phone] available and administer at 125 mL/hr if patient becomes hypotensive.    Standing Status:   Standing    Number of Occurrences:   1   Chronic Opioid Analgesic:  No opioid analgesics from our practice.   Medications ordered for procedure: Meds ordered this encounter  Medications   iohexol  (OMNIPAQUE ) 180 MG/ML injection 10 mL    Must be Myelogram-compatible. If not available, you may substitute with a water-soluble, non-ionic, hypoallergenic, myelogram-compatible radiological contrast medium.   lidocaine  (XYLOCAINE ) 2 % (with pres) injection 400 mg   pentafluoroprop-tetrafluoroeth (GEBAUERS) aerosol   midazolam  (VERSED ) injection 0.5-2 mg    Make sure Flumazenil is available in the pyxis when using this medication. If oversedation occurs, administer 0.2 mg IV over 15 sec. If after 45 sec no response, administer 0.2 mg again over 1 min; may repeat at 1 min intervals; not to exceed 4 doses (1 mg)   sodium chloride  flush (NS) 0.9 % injection 2 mL   ropivacaine  (PF) 2 mg/mL (0.2%) (NAROPIN ) injection 2 mL   triamcinolone  acetonide (KENALOG -40) injection 40 mg   Medications administered: We administered iohexol , lidocaine , midazolam , sodium chloride  flush, ropivacaine  (PF) 2 mg/mL (0.2%), and  triamcinolone  acetonide.  See the medical record for  exact dosing, route, and time of administration.  Follow-up plan:   Return in about 2 weeks (around 10/03/2023) for (Face2F), (PPE).       Interventional Therapies  Risk Factors  Considerations:  BCBSNC DENIED L-MRI (02/17/19)    Planned  Pending:   Diagnostic/therapeutic right caudal ESI #1 Therapeutic right L4-5 LESI #2    Under consideration:   Therapeutic right lumbar facet RFA #2  Therapeutic left lumbar facet RFA #2  Therapeutic right L4-5 LESI #2    Completed:   Diagnostic/therapeutic right L4-5 LESI x1 (01/22/2019) (50/50/90 x2 weeks)  Therapeutic bilateral lumbar facet MBB x7 (04/10/2022) (100/100/95/90-100/duration of up to 8 months)  Therapeutic bilateral SI Blk x4 (11/09/2021) (100/100/80) (100/100/100/50)  Therapeutic right (L4-5, L5-S1) lumbar facet RFA x2 (07/10/2022) (100/100/75/75) (first 1 lasted 3 years)  Therapeutic left lumbar facet RFA x1 (08/06/2019) (100/100/75/75)  Therapeutic right lumbar facet RFA x1 (07/10/2022) ( (100/100/75/75)  Therapeutic right SI RFA x1 (03/17/2020) (100/100/100/90-100)  Therapeutic left SI RFA x1 (03/31/2020) (100/100/100 x3 weeks/70)    Completed by other providers:      Therapeutic  Palliative (PRN) options:   Palliative lumbar facet MBB  Therapeutic lumbar facet RFA  Therapeutic SI RFA  Palliative SI joint Blk  Therapeutic L4-5 LESI       Recent Visits Date Type Provider Dept  09/18/23 Appointment Renaldo Caroli, MD [facility]  09/03/23 Office Visit Renaldo Caroli, MD [facility]  08/01/23 Procedure visit Renaldo Caroli, MD [facility]  07/15/23 Office Visit Renaldo Caroli, MD [facility]  Showing recent visits within past 90 days and meeting all other requirements Today's Visits Date Type Provider Dept  09/19/23 Procedure visit Renaldo Caroli, MD [facility]  Showing today's visits and meeting  all other requirements Future Appointments Date Type Provider Dept  10/28/23 Appointment Renaldo Caroli, MD [facility]  Showing future appointments within next 90 days and meeting all other requirements  Disposition: Discharge home  Discharge (Date  Time): 09/19/2023; 1047 hrs.   Primary Care Physician: Mazie Speed, MD Location: [facility] Note by: Candi Chafe, MD (TTS technology used. I apologize for any typographical errors that were not detected and corrected.) Date: 09/19/2023; Time: 10:58 AM  Disclaimer:  Medicine is not an Visual merchandiser. The only guarantee in medicine is that nothing is guaranteed. It is important to note that the decision to proceed with this intervention was based on the information collected from the patient. The Data and conclusions were drawn from the patient's questionnaire, the interview, and the physical examination. Because the information was provided in large part by the patient, it cannot be guaranteed that it has not been purposely or unconsciously manipulated. Every effort has been made to obtain as much relevant data as possible for this evaluation. It is important to note that the conclusions that lead to this procedure are derived in large part from the available data. Always take into account that the treatment will also be dependent on availability of resources and existing treatment guidelines, considered by other Pain Management Practitioners as being common knowledge and practice, at the time of the intervention. For Medico-Legal purposes, it is also important to point out that variation in procedural techniques and pharmacological choices are the acceptable norm. The indications, contraindications, technique, and results of the above procedure should only be interpreted and judged by a Board-Certified Interventional Pain Specialist with extensive familiarity and expertise in the same exact  procedure and technique.

## 2023-09-20 ENCOUNTER — Telehealth: Payer: Self-pay

## 2023-09-20 NOTE — Telephone Encounter (Signed)
 Called pp.No answer, Left message to call if needed or she could always go to the ED.

## 2023-09-24 ENCOUNTER — Ambulatory Visit: Admitting: Pain Medicine

## 2023-10-02 ENCOUNTER — Other Ambulatory Visit: Payer: Self-pay | Admitting: Family Medicine

## 2023-10-02 DIAGNOSIS — E1169 Type 2 diabetes mellitus with other specified complication: Secondary | ICD-10-CM

## 2023-10-03 NOTE — Telephone Encounter (Signed)
 Requested Prescriptions  Pending Prescriptions Disp Refills   esomeprazole  (NEXIUM ) 40 MG capsule [Pharmacy Med Name: ESOMEPRAZOLE  MAGNESIUM  40MG  DR CAPSULE DR] 90 capsule 2    Sig: TAKE ONE CAPSULE BY MOUTH ONCE DAILY     Gastroenterology: Proton Pump Inhibitors 2 Passed - 10/03/2023  4:25 PM      Passed - ALT in normal range and within 360 days    ALT  Date Value Ref Range Status  09/16/2023 20 0 - 32 IU/L Final         Passed - AST in normal range and within 360 days    AST  Date Value Ref Range Status  09/16/2023 26 0 - 40 IU/L Final         Passed - Valid encounter within last 12 months    Recent Outpatient Visits           2 weeks ago Hypertension associated with diabetes [facility])   [facility] [address], Stan Eans, MD       Future Appointments             In 6 months Bacigalupo, Stan Eans, MD [facility]             ezetimibe  (ZETIA ) 10 MG tablet [Pharmacy Med Name: EZETIMIBE  10MG  TABLET] 30 tablet 1    Sig: TAKE ONE TABLET BY MOUTH TWO TIMES A WEEK     Cardiovascular:  Antilipid - Sterol Transport Inhibitors Failed - 10/03/2023  4:25 PM      Failed - Lipid Panel in normal range within the last 12 months    Cholesterol, Total  Date Value Ref Range Status  09/16/2023 209 (H) 100 - 199 mg/dL Final   LDL Chol Calc (NIH)  Date Value Ref Range Status  09/16/2023 129 (H) 0 - 99 mg/dL Final   HDL  Date Value Ref Range Status  09/16/2023 50 >39 mg/dL Final   Triglycerides  Date Value Ref Range Status  09/16/2023 170 (H) 0 - 149 mg/dL Final         Passed - AST in normal range and within 360 days    AST  Date Value Ref Range Status  09/16/2023 26 0 - 40 IU/L Final         Passed - ALT in normal range and within 360 days    ALT  Date Value Ref Range Status  09/16/2023 20 0 - 32 IU/L Final         Passed - Patient is not pregnant      Passed - Valid encounter within last 12 months    Recent  Outpatient Visits           2 weeks ago Hypertension associated with diabetes [facility])   [place] [facility] Bacigalupo, Stan Eans, MD       Future Appointments             In 6 months Bacigalupo, Stan Eans, MD [facility]

## 2023-10-15 DIAGNOSIS — E119 Type 2 diabetes mellitus without complications: Secondary | ICD-10-CM | POA: Diagnosis not present

## 2023-10-15 DIAGNOSIS — M5431 Sciatica, right side: Secondary | ICD-10-CM | POA: Diagnosis not present

## 2023-10-15 DIAGNOSIS — H2513 Age-related nuclear cataract, bilateral: Secondary | ICD-10-CM | POA: Diagnosis not present

## 2023-10-15 DIAGNOSIS — M9903 Segmental and somatic dysfunction of lumbar region: Secondary | ICD-10-CM | POA: Diagnosis not present

## 2023-10-15 DIAGNOSIS — D3131 Benign neoplasm of right choroid: Secondary | ICD-10-CM | POA: Diagnosis not present

## 2023-10-15 DIAGNOSIS — M5417 Radiculopathy, lumbosacral region: Secondary | ICD-10-CM | POA: Diagnosis not present

## 2023-10-15 DIAGNOSIS — H5203 Hypermetropia, bilateral: Secondary | ICD-10-CM | POA: Diagnosis not present

## 2023-10-15 DIAGNOSIS — M9901 Segmental and somatic dysfunction of cervical region: Secondary | ICD-10-CM | POA: Diagnosis not present

## 2023-10-22 NOTE — Progress Notes (Unsigned)
 PROVIDER NOTE: Interpretation of information contained herein should be left to medically-trained personnel. Specific patient instructions are provided elsewhere under Patient Instructions section of medical record. This document was created in part using AI and STT-dictation technology, any transcriptional errors that may result from this process are unintentional.  Patient: Ashley Rodriguez  Service: E/M   PCP: Myrla Jon HERO, MD  DOB: April 15, 1951  DOS: 10/23/2023  Provider: Eric DELENA Como, MD  MRN: [id number]  Delivery: Face-to-face  Specialty: Interventional Pain Management  Type: Established Patient  Setting: Ambulatory outpatient facility  Specialty designation: 09  Referring Prov.: Bacigalupo, Jon HERO, MD  Location: Outpatient office facility       History of present illness (HPI) Ms. Jillien Yakel, a 73 y.o. year old female, is here today because of her Chronic bilateral low back pain with bilateral sciatica [M54.42, M54.41, G89.29]. Ms. Redditt primary complain today is No chief complaint on file.  Pertinent problems: Ms. Howland has Psoriatic arthritis (HCC); Chronic low back pain (1ry area of Pain) (Bilateral) (R>L) w/ sciatica (Bilateral); Chronic lower extremity pain (2ry area of Pain) (Bilateral) (R>L); Grade 1 Anterolisthesis of L5 over S1; L5 pars defect with spondylolisthesis (Bilateral); DDD (degenerative disc disease), lumbar; Lumbar facet syndrome (Bilateral) (R>L); Chronic pain syndrome; Chronic musculoskeletal pain; Neurogenic pain; Chronic sacroiliac joint pain (Bilateral) (R>L); Osteoarthritis of lumbar spine; Spondylosis without myelopathy or radiculopathy, lumbosacral region; Peripheral neuropathy; Generalized osteoarthritis of hand; Numbness and tingling of foot; Low back pain radiating to both legs; Abnormal EMG (chronic right lower lumbar polyradiculopathy); Chronic lower lumbar polyradiculopathy (Right); DDD (degenerative disc disease), lumbosacral; Lumbar spondylosis  w/ polyradiculopathy (Right); Lumbosacral radiculopathy (Right); Other intervertebral disc degeneration, lumbar region; Spondylolisthesis of lumbosacral region; Other spondylosis, sacral and sacrococcygeal region; Somatic dysfunction of sacroiliac joints (Bilateral); Osteoarthritis of sacroiliac joints (Bilateral); Myalgia due to statin; Chronic sacroiliac joint pain (Right); Osteoarthritis of sacroiliac joint (HCC) (Right); Enthesopathy of sacroiliac joint; Chronic low back pain (Bilateral) w/o sciatica; Chronic sacroiliac joint pain (Left); Osteoarthritis of sacroiliac joint (Left) (HCC); Somatic dysfunction of sacroiliac joint (Left); Sciatica, left side; Lumbar facet joint pain; Lumbosacral radiculopathy at L5 (Right); and Abnormal CT scan, lumbar spine (09/18/2023) on their pertinent problem list.  Pain Assessment: Severity of   is reported as a  /10. Location:    / . Onset:  . Quality:  . Timing:  . Modifying factor(s):  SABRA Vitals:  vitals were not taken for this visit.  BMI: Estimated body mass index is 26.34 kg/m as calculated from the following:   Height as of 09/19/23: 5' 2 (1.575 m).   Weight as of 09/19/23: 144 lb (65.3 kg).  Last encounter: 09/03/2023. Last procedure: 09/19/2023.  Reason for encounter: post-procedure evaluation and assessment.   Discussed the use of AI scribe software for clinical note transcription with the patient, who gave verbal consent to proceed.  History of Present Illness         Post-Procedure Evaluation   Type: Caudal Epidural Steroid Injection #1  Laterality: Midline aiming right Level: Sacrococcygeal ligament  Imaging: Fluoroscopy-guided Spinal ([id number]) Anesthesia: Local anesthesia (1-2% Lidocaine ) Anxiolysis: IV Versed  2.0 mg Sedation: No Sedation                       DOS: 09/19/2023  Performed by: Eric DELENA Como, MD  Purpose: Diagnostic/Therapeutic Indications: Low back and lower extremity pain severe enough to impact quality of life or  function. Rationale (medical necessity): procedure needed and proper for the diagnosis and/or treatment  of Ms. Manzano medical symptoms and needs. 1. Chronic low back pain (1ry area of Pain) (Bilateral) (R>L) w/ sciatica (Bilateral)   2. Chronic lower extremity pain (2ry area of Pain) (Bilateral) (R>L)   3. Chronic lower lumbar polyradiculopathy (Right)   4. Degeneration of intervertebral disc of lumbosacral region with discogenic back pain and lower extremity pain   5. Grade 1 Anterolisthesis of L5 over S1   6. L5 pars defect with spondylolisthesis (Bilateral)   7. Lumbar spondylosis w/ polyradiculopathy (Right)   8. Lumbosacral radiculopathy (Right)   9. Lumbosacral radiculopathy at L5 (Right)   10. Spondylolisthesis of lumbosacral region   11. Low back pain radiating to both legs   12. Abnormal EMG (chronic right lower lumbar polyradiculopathy)   13. Abnormal CT scan, lumbar spine (09/18/2023)    NAS-11 Pain score:   Pre-procedure: 4 /10   Post-procedure: 3 /10     Effectiveness:  Initial hour after procedure:   ***. Subsequent 4-6 hours post-procedure:   ***. Analgesia past initial 6 hours:   ***. Ongoing improvement:  Analgesic:  *** Function:    ***    ROM:    ***      Pharmacotherapy Assessment   Analgesic: No opioid analgesics from our practice.  Monitoring: [place] PMP: PDMP reviewed during this encounter.       Pharmacotherapy: No side-effects or adverse reactions reported. Compliance: No problems identified. Effectiveness: Clinically acceptable.  No notes on file  UDS:  No results found for: SUMMARY  No results found for: CBDTHCR No results found for: D8THCCBX No results found for: D9THCCBX  ROS  Constitutional: Denies any fever or chills Gastrointestinal: No reported hemesis, hematochezia, vomiting, or acute GI distress Musculoskeletal: Denies any acute onset joint swelling, redness, loss of ROM, or weakness Neurological: No reported episodes of  acute onset apraxia, aphasia, dysarthria, agnosia, amnesia, paralysis, loss of coordination, or loss of consciousness  Medication Review  Turmeric, acetaminophen , amLODipine , atenolol, betamethasone dipropionate, calcium  carbonate, co-enzyme Q-10, esomeprazole , etanercept, ezetimibe , fexofenadine, fluticasone , gabapentin , losartan , montelukast , multivitamin with minerals, oxybutynin , and valACYclovir  History Review  Allergy: Ms. Elgersma is allergic to atorvastatin , sulfamethoxazole-trimethoprim, and codeine. Drug: Ms. Halbleib  reports no history of drug use. Alcohol:  reports current alcohol use of about 2.0 - 6.0 standard drinks of alcohol per week. Tobacco:  reports that she has never smoked. She has never used smokeless tobacco. Social: Ms. Regner  reports that she has never smoked. She has never used smokeless tobacco. She reports current alcohol use of about 2.0 - 6.0 standard drinks of alcohol per week. She reports that she does not use drugs. Medical:  has a past medical history of Allergy, COVID-19 (12/15/2018), Flank lipoma (08/17/2016), GERD (gastroesophageal reflux disease), Hypertension, Neck mass (04/07/2013), Psoriatic arthritis (HCC), Recurrent cellulitis, Sleep apnea, and T2DM (type 2 diabetes mellitus) (HCC) (10/01/2018). Surgical: Ms. Lal  has a past surgical history that includes Supracervical abdominal hysterectomy (04/30/1998); lipoma removed (04/30/2012); Colonoscopy (04/30/2013); Neck mass excision (04/28/2014); Tubal ligation (1981); and Abdominal hysterectomy (2000). Family: family history includes Arrhythmia in her nephew; Arthritis in her maternal grandmother; Atrial fibrillation in her brother and mother; Cancer in her father; Hypertension in her mother; Ovarian cancer in her paternal aunt; Supraventricular tachycardia in her brother.  Laboratory Chemistry Profile   Renal Lab Results  Component Value Date   BUN 9 09/16/2023   CREATININE 0.67 09/16/2023   BCR 13  09/16/2023   GFRAA 94 05/17/2020   GFRNONAA 81 05/17/2020    Hepatic  Lab Results  Component Value Date   AST 26 09/16/2023   ALT 20 09/16/2023   ALBUMIN 4.7 09/16/2023   ALKPHOS 81 09/16/2023    Electrolytes Lab Results  Component Value Date   NA 139 09/16/2023   K 5.1 09/16/2023   CL 100 09/16/2023   CALCIUM  9.8 09/16/2023   MG 2.1 01/03/2017    Bone Lab Results  Component Value Date   VD25OH 47.6 03/07/2022   25OHVITD1 50 01/03/2017   25OHVITD2 <1.0 01/03/2017   25OHVITD3 50 01/03/2017    Inflammation (CRP: Acute Phase) (ESR: Chronic Phase) Lab Results  Component Value Date   CRP 0.8 01/03/2017   ESRSEDRATE 2 01/25/2020         Note: Above Lab results reviewed.  Recent Imaging Review  DG PAIN CLINIC C-ARM 1-60 MIN NO REPORT Fluoro was used, but no Radiologist interpretation will be provided.  Please refer to NOTES tab for provider progress note. Note: Reviewed        Physical Exam  General appearance: Well nourished, well developed, and well hydrated. In no apparent acute distress Mental status: Alert, oriented x 3 (person, place, & time)       Respiratory: No evidence of acute respiratory distress Eyes: PERLA Vitals: There were no vitals taken for this visit. BMI: Estimated body mass index is 26.34 kg/m as calculated from the following:   Height as of 09/19/23: 5' 2 (1.575 m).   Weight as of 09/19/23: 144 lb (65.3 kg). Ideal: Patient weight not recorded  Assessment   Diagnosis Status  1. Chronic low back pain (1ry area of Pain) (Bilateral) (R>L) w/ sciatica (Bilateral)   2. Chronic lower extremity pain (2ry area of Pain) (Bilateral) (R>L)   3. Chronic lower lumbar polyradiculopathy (Right)   4. Lumbosacral radiculopathy (Right)   5. Lumbosacral radiculopathy at L5 (Right)   6. Low back pain radiating to both legs   7. Postop check    Controlled Controlled Controlled   Updated Problems: No problems updated.  Plan of Care  Problem-specific:   Assessment and Plan            Ms. Palyn Scrima has a current medication list which includes the following long-term medication(s): amlodipine , atenolol, calcium  carbonate, enbrel sureclick, esomeprazole , ezetimibe , fexofenadine, fluticasone , gabapentin , losartan , and montelukast .  Pharmacotherapy (Medications Ordered): No orders of the defined types were placed in this encounter.  Orders:  No orders of the defined types were placed in this encounter.    Interventional Therapies  Risk Factors  Considerations:  BCBSNC DENIED L-MRI (02/17/19)    Planned  Pending:   Diagnostic/therapeutic right caudal ESI #1 Therapeutic right L4-5 LESI #2    Under consideration:   Therapeutic right lumbar facet RFA #2  Therapeutic left lumbar facet RFA #2  Therapeutic right L4-5 LESI #2    Completed:   Diagnostic/therapeutic right L4-5 LESI x1 (01/22/2019) (50/50/90 x2 weeks)  Therapeutic bilateral lumbar facet MBB x7 (04/10/2022) (100/100/95/90-100/duration of up to 8 months)  Therapeutic bilateral SI Blk x4 (11/09/2021) (100/100/80) (100/100/100/50)  Therapeutic right (L4-5, L5-S1) lumbar facet RFA x2 (07/10/2022) (100/100/75/75) (first 1 lasted 3 years)  Therapeutic left lumbar facet RFA x1 (08/06/2019) (100/100/75/75)  Therapeutic right lumbar facet RFA x1 (07/10/2022) ( (100/100/75/75)  Therapeutic right SI RFA x1 (03/17/2020) (100/100/100/90-100)  Therapeutic left SI RFA x1 (03/31/2020) (100/100/100 x3 weeks/70)    Completed by other providers:      Therapeutic  Palliative (PRN) options:   Palliative lumbar facet MBB  Therapeutic lumbar  facet RFA  Therapeutic SI RFA  Palliative SI joint Blk  Therapeutic L4-5 LESI      No follow-ups on file.    Recent Visits Date Type Provider Dept  09/19/23 Procedure visit Tanya Glisson, MD [facility]  09/03/23 Office Visit Tanya Glisson, MD [facility]  08/01/23 Procedure visit Tanya Glisson, MD [facility]  Showing recent visits within past 90 days and meeting all other requirements Future Appointments Date Type Provider Dept  10/23/23 Appointment Tanya Glisson, MD [facility]  Showing future appointments within next 90 days and meeting all other requirements  I discussed the assessment and treatment plan with the patient. The patient was provided an opportunity to ask questions and all were answered. The patient agreed with the plan and demonstrated an understanding of the instructions.  Patient advised to call back or seek an in-person evaluation if the symptoms or condition worsens.  Duration of encounter: *** minutes.  Total time on encounter, as per AMA guidelines included both the face-to-face and non-face-to-face time personally spent by the physician and/or other qualified health care professional(s) on the day of the encounter (includes time in activities that require the physician or other qualified health care professional and does not include time in activities normally performed by clinical staff). Physician's time may include the following activities when performed: Preparing to see the patient (e.g., pre-charting review of records, searching for previously ordered imaging, lab work, and nerve conduction tests) Review of prior analgesic pharmacotherapies. Reviewing PMP Interpreting ordered tests (e.g., lab work, imaging, nerve conduction tests) Performing post-procedure evaluations, including interpretation of diagnostic procedures Obtaining and/or reviewing separately obtained history Performing a medically appropriate examination and/or evaluation Counseling and educating the patient/family/caregiver Ordering medications, tests, or procedures Referring and communicating with other health care professionals (when not separately reported) Documenting clinical information in the electronic or other health record Independently interpreting results (not  separately reported) and communicating results to the patient/ family/caregiver Care coordination (not separately reported)  Note by: Glisson DELENA Tanya, MD (TTS and AI technology used. I apologize for any typographical errors that were not detected and corrected.) Date: 10/23/2023; Time: 8:06 AM

## 2023-10-23 ENCOUNTER — Encounter: Payer: Self-pay | Admitting: Pain Medicine

## 2023-10-23 ENCOUNTER — Ambulatory Visit: Admitting: Pain Medicine

## 2023-10-23 ENCOUNTER — Ambulatory Visit: Attending: Pain Medicine | Admitting: Pain Medicine

## 2023-10-23 VITALS — BP 144/78 | HR 68 | Temp 96.6°F | Ht 62.0 in | Wt 144.0 lb

## 2023-10-23 DIAGNOSIS — M79605 Pain in left leg: Secondary | ICD-10-CM | POA: Diagnosis not present

## 2023-10-23 DIAGNOSIS — M5417 Radiculopathy, lumbosacral region: Secondary | ICD-10-CM | POA: Diagnosis not present

## 2023-10-23 DIAGNOSIS — M5442 Lumbago with sciatica, left side: Secondary | ICD-10-CM | POA: Diagnosis not present

## 2023-10-23 DIAGNOSIS — G8929 Other chronic pain: Secondary | ICD-10-CM | POA: Diagnosis not present

## 2023-10-23 DIAGNOSIS — M5416 Radiculopathy, lumbar region: Secondary | ICD-10-CM | POA: Diagnosis not present

## 2023-10-23 DIAGNOSIS — M79604 Pain in right leg: Secondary | ICD-10-CM | POA: Insufficient documentation

## 2023-10-23 DIAGNOSIS — M5441 Lumbago with sciatica, right side: Secondary | ICD-10-CM | POA: Insufficient documentation

## 2023-10-23 DIAGNOSIS — M545 Low back pain, unspecified: Secondary | ICD-10-CM | POA: Diagnosis not present

## 2023-10-23 DIAGNOSIS — Z09 Encounter for follow-up examination after completed treatment for conditions other than malignant neoplasm: Secondary | ICD-10-CM | POA: Diagnosis not present

## 2023-10-28 ENCOUNTER — Ambulatory Visit: Admitting: Pain Medicine

## 2023-11-11 ENCOUNTER — Other Ambulatory Visit: Payer: Self-pay | Admitting: Family Medicine

## 2023-11-12 DIAGNOSIS — M5417 Radiculopathy, lumbosacral region: Secondary | ICD-10-CM | POA: Diagnosis not present

## 2023-11-12 DIAGNOSIS — M9901 Segmental and somatic dysfunction of cervical region: Secondary | ICD-10-CM | POA: Diagnosis not present

## 2023-11-12 DIAGNOSIS — M5431 Sciatica, right side: Secondary | ICD-10-CM | POA: Diagnosis not present

## 2023-11-12 DIAGNOSIS — M9903 Segmental and somatic dysfunction of lumbar region: Secondary | ICD-10-CM | POA: Diagnosis not present

## 2023-11-20 DIAGNOSIS — R238 Other skin changes: Secondary | ICD-10-CM | POA: Diagnosis not present

## 2023-11-20 DIAGNOSIS — B078 Other viral warts: Secondary | ICD-10-CM | POA: Diagnosis not present

## 2023-11-20 DIAGNOSIS — D225 Melanocytic nevi of trunk: Secondary | ICD-10-CM | POA: Diagnosis not present

## 2023-11-20 DIAGNOSIS — D0461 Carcinoma in situ of skin of right upper limb, including shoulder: Secondary | ICD-10-CM | POA: Diagnosis not present

## 2023-11-20 DIAGNOSIS — D2272 Melanocytic nevi of left lower limb, including hip: Secondary | ICD-10-CM | POA: Diagnosis not present

## 2023-11-20 DIAGNOSIS — D2261 Melanocytic nevi of right upper limb, including shoulder: Secondary | ICD-10-CM | POA: Diagnosis not present

## 2023-11-20 DIAGNOSIS — D2262 Melanocytic nevi of left upper limb, including shoulder: Secondary | ICD-10-CM | POA: Diagnosis not present

## 2023-11-20 DIAGNOSIS — D485 Neoplasm of uncertain behavior of skin: Secondary | ICD-10-CM | POA: Diagnosis not present

## 2023-11-20 DIAGNOSIS — L570 Actinic keratosis: Secondary | ICD-10-CM | POA: Diagnosis not present

## 2023-11-20 DIAGNOSIS — Z85828 Personal history of other malignant neoplasm of skin: Secondary | ICD-10-CM | POA: Diagnosis not present

## 2023-11-20 DIAGNOSIS — L538 Other specified erythematous conditions: Secondary | ICD-10-CM | POA: Diagnosis not present

## 2023-11-25 ENCOUNTER — Other Ambulatory Visit: Payer: Self-pay | Admitting: Family Medicine

## 2023-11-26 DIAGNOSIS — M1991 Primary osteoarthritis, unspecified site: Secondary | ICD-10-CM | POA: Diagnosis not present

## 2023-11-26 DIAGNOSIS — L4050 Arthropathic psoriasis, unspecified: Secondary | ICD-10-CM | POA: Diagnosis not present

## 2023-11-26 DIAGNOSIS — R5383 Other fatigue: Secondary | ICD-10-CM | POA: Diagnosis not present

## 2023-11-26 DIAGNOSIS — Z79899 Other long term (current) drug therapy: Secondary | ICD-10-CM | POA: Diagnosis not present

## 2023-11-26 DIAGNOSIS — L409 Psoriasis, unspecified: Secondary | ICD-10-CM | POA: Diagnosis not present

## 2023-12-10 ENCOUNTER — Other Ambulatory Visit: Payer: Self-pay | Admitting: Family Medicine

## 2023-12-10 DIAGNOSIS — M5431 Sciatica, right side: Secondary | ICD-10-CM | POA: Diagnosis not present

## 2023-12-10 DIAGNOSIS — M9903 Segmental and somatic dysfunction of lumbar region: Secondary | ICD-10-CM | POA: Diagnosis not present

## 2023-12-10 DIAGNOSIS — M9901 Segmental and somatic dysfunction of cervical region: Secondary | ICD-10-CM | POA: Diagnosis not present

## 2023-12-10 DIAGNOSIS — M5417 Radiculopathy, lumbosacral region: Secondary | ICD-10-CM | POA: Diagnosis not present

## 2023-12-16 ENCOUNTER — Ambulatory Visit: Attending: Pain Medicine | Admitting: Pain Medicine

## 2023-12-16 ENCOUNTER — Encounter: Payer: Self-pay | Admitting: Pain Medicine

## 2023-12-16 VITALS — BP 132/82 | HR 66 | Temp 97.0°F | Ht 62.0 in | Wt 145.0 lb

## 2023-12-16 DIAGNOSIS — G8929 Other chronic pain: Secondary | ICD-10-CM | POA: Insufficient documentation

## 2023-12-16 DIAGNOSIS — M150 Primary generalized (osteo)arthritis: Secondary | ICD-10-CM | POA: Insufficient documentation

## 2023-12-16 DIAGNOSIS — R937 Abnormal findings on diagnostic imaging of other parts of musculoskeletal system: Secondary | ICD-10-CM | POA: Diagnosis not present

## 2023-12-16 DIAGNOSIS — M4310 Spondylolisthesis, site unspecified: Secondary | ICD-10-CM | POA: Insufficient documentation

## 2023-12-16 DIAGNOSIS — M79605 Pain in left leg: Secondary | ICD-10-CM | POA: Diagnosis not present

## 2023-12-16 DIAGNOSIS — M79604 Pain in right leg: Secondary | ICD-10-CM | POA: Diagnosis not present

## 2023-12-16 DIAGNOSIS — M5442 Lumbago with sciatica, left side: Secondary | ICD-10-CM | POA: Diagnosis not present

## 2023-12-16 DIAGNOSIS — R94131 Abnormal electromyogram [EMG]: Secondary | ICD-10-CM | POA: Insufficient documentation

## 2023-12-16 DIAGNOSIS — M5416 Radiculopathy, lumbar region: Secondary | ICD-10-CM | POA: Diagnosis not present

## 2023-12-16 DIAGNOSIS — M5441 Lumbago with sciatica, right side: Secondary | ICD-10-CM | POA: Diagnosis not present

## 2023-12-16 DIAGNOSIS — M4317 Spondylolisthesis, lumbosacral region: Secondary | ICD-10-CM

## 2023-12-16 DIAGNOSIS — M4726 Other spondylosis with radiculopathy, lumbar region: Secondary | ICD-10-CM | POA: Insufficient documentation

## 2023-12-16 DIAGNOSIS — M545 Low back pain, unspecified: Secondary | ICD-10-CM | POA: Diagnosis not present

## 2023-12-16 DIAGNOSIS — M5417 Radiculopathy, lumbosacral region: Secondary | ICD-10-CM | POA: Diagnosis not present

## 2023-12-16 NOTE — Progress Notes (Signed)
 Safety precautions to be maintained throughout the outpatient stay will include: orient to surroundings, keep bed in low position, maintain call bell within reach at all times, provide assistance with transfer out of bed and ambulation.

## 2023-12-16 NOTE — Progress Notes (Signed)
 PROVIDER NOTE: Interpretation of information contained herein should be left to medically-trained personnel. Specific patient instructions are provided elsewhere under Patient Instructions section of medical record. This document was created in part using AI and STT-dictation technology, any transcriptional errors that may result from this process are unintentional.  Patient: Ashley Rodriguez  Service: E/M   PCP: Ashley Ashley HERO, MD  DOB: Nov 25, 1950  DOS: 12/16/2023  Provider: Eric DELENA Como, MD  MRN: [id number]  Delivery: Face-to-face  Specialty: Interventional Pain Management  Type: Established Patient  Setting: Ambulatory outpatient facility  Specialty designation: 09  Referring Prov.: Rodriguez, Ashley HERO, MD  Location: Outpatient office facility       History of present illness (HPI) [name], a 73 y.o. year old female, is here today because of her Chronic bilateral low back pain with bilateral sciatica [M54.42, M54.41, G89.29]. [name] primary complain today is Hip Pain (Right buttock; sciatica)  Pertinent problems: [name] has Psoriatic arthritis (HCC); Chronic low back pain (1ry area of Pain) (Bilateral) (R>L) w/ sciatica (Bilateral); Chronic lower extremity pain (2ry area of Pain) (Bilateral) (R>L); Grade 1 Anterolisthesis of L5 over S1; L5 pars defect with spondylolisthesis (Bilateral); DDD (degenerative disc disease), lumbar; Lumbar facet syndrome (Bilateral) (R>L); Chronic pain syndrome; Chronic musculoskeletal pain; Neurogenic pain; Chronic sacroiliac joint pain (Bilateral) (R>L); Osteoarthritis of lumbar spine; Spondylosis without myelopathy or radiculopathy, lumbosacral region; Peripheral neuropathy; Generalized osteoarthritis of hand; Numbness and tingling of foot; Low back pain radiating to both legs; Abnormal EMG (chronic right lower lumbar polyradiculopathy); Chronic lower lumbar polyradiculopathy (Right); DDD (degenerative disc disease), lumbosacral; Lumbar  spondylosis w/ polyradiculopathy (Right); Lumbosacral radiculopathy (Right); Other intervertebral disc degeneration, lumbar region; Spondylolisthesis of lumbosacral region; Other spondylosis, sacral and sacrococcygeal region; Somatic dysfunction of sacroiliac joints (Bilateral); Osteoarthritis of sacroiliac joints (Bilateral); Myalgia due to statin; Chronic sacroiliac joint pain (Right); Osteoarthritis of sacroiliac joint (HCC) (Right); Enthesopathy of sacroiliac joint; Chronic low back pain (Bilateral) w/o sciatica; Chronic sacroiliac joint pain (Left); Osteoarthritis of sacroiliac joint (Left) (HCC); Somatic dysfunction of sacroiliac joint (Left); Sciatica, left side; Lumbar facet joint pain; Lumbosacral radiculopathy at L5 (Right); Neuralgia of left sciatic nerve; Primary osteoarthritis; Abnormal CT scan, lumbar spine (09/18/2023); Primary osteoarthritis involving multiple joints; Low back pain of over 3 months duration; Intermittent low back pain; and Multifactorial low back pain on their pertinent problem list.  Pain Assessment: Severity of Chronic pain is reported as a 6 /10. Location: Buttocks Right/radiates down rigth leg to bottom of foot. Onset: More than a month ago. Quality: Numbness. Timing: Intermittent. Modifying factor(s): walking and moving. Vitals:  height is 5' 2 (1.575 m) and weight is 145 lb (65.8 kg). Her temperature is 97 F (36.1 C) (abnormal). Her blood pressure is 132/82 and her pulse is 66. Her oxygen saturation is 96%.  BMI: Estimated body mass index is 26.52 kg/m as calculated from the following:   Height as of this encounter: 5' 2 (1.575 m).   Weight as of this encounter: 145 lb (65.8 kg).  Last encounter: 10/23/2023. Last procedure: 09/19/2023.  Reason for encounter: evaluation of worsening, or previously known (established) problem.   Discussed the use of AI scribe software for clinical note transcription with the patient, who gave verbal consent to proceed.  History  of Present Illness   Ashley Rodriguez is a 73 year old female who presents with lower extremity pain and numbness.  She experiences pain upon rising in the morning, causing her to hobble due to pain radiating down to  the bottom of her foot, accompanied by numbness extending to her foot. The pain was particularly severe last week but has improved over the last two days. This morning, she rated her pain as six out of ten. Last week, she experienced significant discomfort for about an hour to an hour and a half each morning. Despite the pain, she played golf today, which exacerbated her symptoms. No current pain at the time of the visit.      Pharmacotherapy Assessment   Opioid Analgesic:  No opioid analgesics from our practice.  Monitoring: Ashley Rodriguez PMP: PDMP reviewed during this encounter.       Pharmacotherapy: No side-effects or adverse reactions reported. Compliance: No problems identified. Effectiveness: Clinically acceptable.  Ashley [name] PARAS, RN  12/16/2023  3:14 PM  Sign when Signing Visit Safety precautions to be maintained throughout the outpatient stay will include: orient to surroundings, keep bed in low position, maintain call bell within reach at all times, provide assistance with transfer out of bed and ambulation.     UDS:  No results found for: SUMMARY  No results found for: CBDTHCR No results found for: D8THCCBX No results found for: D9THCCBX  ROS  Constitutional: Denies any fever or chills Gastrointestinal: No reported hemesis, hematochezia, vomiting, or acute GI distress Musculoskeletal: Denies any acute onset joint swelling, redness, loss of ROM, or weakness Neurological: No reported episodes of acute onset apraxia, aphasia, dysarthria, agnosia, amnesia, paralysis, loss of coordination, or loss of consciousness  Medication Review  Turmeric, acetaminophen , amLODipine , atenolol, betamethasone dipropionate, calcium  carbonate, co-enzyme Q-10, esomeprazole , etanercept,  ezetimibe , fexofenadine, fluticasone , gabapentin , losartan , montelukast , multivitamin with minerals, oxybutynin , and valACYclovir  History Review  Allergy: Ashley Rodriguez is allergic to atorvastatin , sulfamethoxazole-trimethoprim, and codeine. Drug: Ms. Wisnieski  reports no history of drug use. Alcohol:  reports current alcohol use of about 2.0 - 6.0 standard drinks of alcohol per week. Tobacco:  reports that she has never smoked. She has never used smokeless tobacco. Social: Ms. Keil  reports that she has never smoked. She has never used smokeless tobacco. She reports current alcohol use of about 2.0 - 6.0 standard drinks of alcohol per week. She reports that she does not use drugs. Medical:  has a past medical history of Allergy, COVID-19 (12/15/2018), Flank lipoma (08/17/2016), GERD (gastroesophageal reflux disease), Hypertension, Neck mass (04/07/2013), Psoriatic arthritis (HCC), Recurrent cellulitis, Sleep apnea, and T2DM (type 2 diabetes mellitus) (HCC) (10/01/2018). Surgical: Ms. Neace  has a past surgical history that includes Supracervical abdominal hysterectomy (04/30/1998); lipoma removed (04/30/2012); Colonoscopy (04/30/2013); Neck mass excision (04/28/2014); Tubal ligation (1981); and Abdominal hysterectomy (2000). Family: family history includes Arrhythmia in her nephew; Arthritis in her maternal grandmother; Atrial fibrillation in her brother and mother; Cancer in her father; Hypertension in her mother; Ovarian cancer in her paternal aunt; Supraventricular tachycardia in her brother.  Laboratory Chemistry Profile   Renal Lab Results  Component Value Date   BUN 9 09/16/2023   CREATININE 0.67 09/16/2023   BCR 13 09/16/2023   GFRAA 94 05/17/2020   GFRNONAA 81 05/17/2020    Hepatic Lab Results  Component Value Date   AST 26 09/16/2023   ALT 20 09/16/2023   ALBUMIN 4.7 09/16/2023   ALKPHOS 81 09/16/2023    Electrolytes Lab Results  Component Value Date   NA 139 09/16/2023   K  5.1 09/16/2023   CL 100 09/16/2023   CALCIUM  9.8 09/16/2023   MG 2.1 01/03/2017    Bone Lab Results  Component Value Date  VD25OH 47.6 03/07/2022   25OHVITD1 50 01/03/2017   25OHVITD2 <1.0 01/03/2017   25OHVITD3 50 01/03/2017    Inflammation (CRP: Acute Phase) (ESR: Chronic Phase) Lab Results  Component Value Date   CRP 0.8 01/03/2017   ESRSEDRATE 2 01/25/2020         Note: Above Lab results reviewed.  Recent Imaging Review  DG PAIN [facility] C-ARM 1-60 MIN NO REPORT Fluoro was used, but no Radiologist interpretation will be provided.  Please refer to NOTES tab for provider progress note. Note: Reviewed        Physical Exam  Vitals: BP 132/82   Pulse 66   Temp (!) 97 F (36.1 C)   Ht 5' 2 (1.575 m)   Wt 145 lb (65.8 kg)   SpO2 96%   BMI 26.52 kg/m  BMI: Estimated body mass index is 26.52 kg/m as calculated from the following:   Height as of this encounter: 5' 2 (1.575 m).   Weight as of this encounter: 145 lb (65.8 kg). Ideal: Ideal body weight: 50.1 kg (110 lb 7.2 oz) Adjusted ideal body weight: 56.4 kg (124 lb 4.3 oz) General appearance: Well nourished, well developed, and well hydrated. In no apparent acute distress Mental status: Alert, oriented x 3 (person, place, & time)       Respiratory: No evidence of acute respiratory distress Eyes: PERLA   Assessment   Diagnosis Status  1. Chronic low back pain (1ry area of Pain) (Bilateral) (R>L) w/ sciatica (Bilateral)   2. Chronic lower extremity pain (2ry area of Pain) (Bilateral) (R>L)   3. Chronic lower lumbar polyradiculopathy (Right)   4. Lumbosacral radiculopathy (Right)   5. Lumbosacral radiculopathy at L5 (Right)   6. Low back pain radiating to both legs   7. Grade 1 Anterolisthesis of L5 over S1   8. L5 pars defect with spondylolisthesis (Bilateral)   9. Lumbar spondylosis w/ polyradiculopathy (Right)   10. Abnormal CT scan, lumbar spine (09/18/2023)   11. Abnormal EMG (chronic right lower lumbar  polyradiculopathy)   12. Low back pain of over 3 months duration   13. Intermittent low back pain   14. Multifactorial low back pain    Controlled Controlled Controlled   Updated Problems: Problem  Primary Osteoarthritis Involving Multiple Joints  Low Back Pain of Over 3 Months Duration  Intermittent Low Back Pain  Multifactorial Low Back Pain  Generalized Osteoarthritis of Hand    Plan of Care  Problem-specific:  Assessment and Plan    Lumbar radiculopathy   Intermittent lumbar radiculopathy presents with pain radiating from the lumbar region to the foot. Severity varies, with recent episodes rated as 6/10 in the morning. Symptoms include numbness and pain, particularly upon waking and after physical activity like golf. Currently asymptomatic but experienced significant discomfort in the past week. Place PRN order for caudal epidural injection to be activated when pain recurs.      Ms. Torunn Chancellor has a current medication list which includes the following long-term medication(s): amlodipine , atenolol, calcium  carbonate, enbrel sureclick, esomeprazole , ezetimibe , fexofenadine, fluticasone , gabapentin , losartan , and montelukast .  Pharmacotherapy (Medications Ordered): No orders of the defined types were placed in this encounter.  Orders:  Orders Placed This Encounter  Procedures   Caudal Epidural Injection    Standing Status:   Standing    Number of Occurrences:   1    Expiration Date:   06/17/2024    Scheduling Instructions:     Laterality: Right-sided     Level(s): Sacrococcygeal  canal (Tailbone area)     Sedation: With Sedation     Scheduling Timeframe: As soon as pre-approved    Where will this procedure be performed?:   [facility]     Interventional Therapies  Risk Factors  Considerations:  [employer] DENIED L-MRI (02/17/19)    Planned  Pending:   Therapeutic right caudal ESI #2 (PRN)    Under consideration:   Diagnostic/therapeutic right caudal ESI  #2  Therapeutic right lumbar facet RFA #2  Therapeutic left lumbar facet RFA #2  Therapeutic right L4-5 LESI #3    Completed:   Diagnostic/therapeutic midline-right caudal ESI x1 (09/19/2023) (100/50/85/85)  Diagnostic/therapeutic right L4-5 LESI x2 (08/01/2023) (100/50/50/50 x2 weeks)  Therapeutic bilateral lumbar facet MBB x7 (04/10/2022) (100/100/95/90-100/duration of up to 8 months)  Therapeutic bilateral SI Blk x4 (11/09/2021) (100/100/80) (100/100/100/50)  Therapeutic right (L4-5, L5-S1) lumbar facet RFA x2 (07/10/2022) (100/100/75/75) (first 1 lasted 3 years)  Therapeutic left lumbar facet RFA x1 (08/06/2019) (100/100/75/75)  Therapeutic right lumbar facet RFA x1 (07/10/2022) ( (100/100/75/75)  Therapeutic right SI RFA x1 (03/17/2020) (100/100/100/90-100)  Therapeutic left SI RFA x1 (03/31/2020) (100/100/100 x3 weeks/70)    Completed by other providers:      Therapeutic  Palliative (PRN) options:   Palliative lumbar facet MBB  Therapeutic lumbar facet RFA  Therapeutic SI RFA  Palliative SI joint Blk  Therapeutic L4-5 LESI      Return for (PRN)(Clinic)(R) Caudal ESI #2.    Recent Visits Date Type Provider Dept  10/23/23 Office Visit Tanya Glisson, MD [facility]  09/19/23 Procedure visit Tanya Glisson, MD [facility]  Showing recent visits within past 90 days and meeting all other requirements Today's Visits Date Type Provider Dept  12/16/23 Office Visit Tanya Glisson, MD [facility]  Showing today's visits and meeting all other requirements Future Appointments No visits were found meeting these conditions. Showing future appointments within next 90 days and meeting all other requirements  I discussed the assessment and treatment plan with the patient. The patient was provided an opportunity to ask questions and all were answered. The patient agreed with the plan and demonstrated an understanding of the instructions.  Patient  advised to call back or seek an in-person evaluation if the symptoms or condition worsens.  Duration of encounter: 30 minutes.  Total time on encounter, as per AMA guidelines included both the face-to-face and non-face-to-face time personally spent by the physician and/or other qualified health care professional(s) on the day of the encounter (includes time in activities that require the physician or other qualified health care professional and does not include time in activities normally performed by clinical staff). Physician's time may include the following activities when performed: Preparing to see the patient (e.g., pre-charting review of records, searching for previously ordered imaging, lab work, and nerve conduction tests) Review of prior analgesic pharmacotherapies. Reviewing PMP Interpreting ordered tests (e.g., lab work, imaging, nerve conduction tests) Performing post-procedure evaluations, including interpretation of diagnostic procedures Obtaining and/or reviewing separately obtained history Performing a medically appropriate examination and/or evaluation Counseling and educating the patient/family/caregiver Ordering medications, tests, or procedures Referring and communicating with other health care professionals (when not separately reported) Documenting clinical information in the electronic or other health record Independently interpreting results (not separately reported) and communicating results to the patient/ family/caregiver Care coordination (not separately reported)  Note by: Glisson DELENA Tanya, MD (TTS and AI technology used. I apologize for any typographical errors that were not detected and corrected.) Date: 12/16/2023;  Time: 4:35 PM

## 2023-12-16 NOTE — Patient Instructions (Addendum)
 ______________________________________________________________________    Procedure instructions  Stop blood-thinners  Do not eat or drink fluids (other than water) for 6 hours before your procedure  No water for 2 hours before your procedure  Take your blood pressure medicine with a sip of water  Arrive 30 minutes before your appointment  If sedation is planned, bring suitable driver. [place], [place], & public transportation are NOT APPROVED)  Carefully read the Preparing for your procedure detailed instructions  If you have questions call us  at [phone]  Procedure appointments are for procedures only.   NO medication refills or new problem evaluations will be done on procedure days.   Only the scheduled, pre-approved procedure and side will be done.   ______________________________________________________________________      ______________________________________________________________________    Preparing for your procedure  Appointments: If you think you may not be able to keep your appointment, call 24-48 hours in advance to cancel. We need time to make it available to others.  Procedure visits are for procedures only. During your procedure appointment there will be: NO Prescription Refills*. NO medication changes or discussions*. NO discussion of disability issues*. NO unrelated pain problem evaluations*. NO evaluations to order other pain procedures*. *These will be addressed at a separate and distinct evaluation encounter on the provider's evaluation schedule and not during procedure days.  Instructions: Food intake: Avoid eating anything solid for at least 8 hours prior to your procedure. Clear liquid intake: You may take clear liquids such as water up to 2 hours prior to your procedure. (No carbonated drinks. No soda.) Transportation: Unless otherwise stated by your physician, bring a driver. (Driver cannot be a Market researcher, Pharmacist, community, or any other form of public  transportation.) Morning Medicines: Except for blood thinners, take all of your other morning medications with a sip of water. Make sure to take your heart and blood pressure medicines. If your blood pressure's lower number is above 100, the case will be rescheduled. Blood thinners: Make sure to stop your blood thinners as instructed.  If you take a blood thinner, but were not instructed to stop it, call our office [phone] and ask to talk to a nurse. Not stopping a blood thinner prior to certain procedures could lead to serious complications. Diabetics on insulin: Notify the staff so that you can be scheduled 1st case in the morning. If your diabetes requires high dose insulin, take only  of your normal insulin dose the morning of the procedure and notify the staff that you have done so. Preventing infections: Shower with an antibacterial soap the morning of your procedure.  Build-up your immune system: Take 1000 mg of Vitamin C with every meal (3 times a day) the day prior to your procedure. Antibiotics: Inform the nursing staff if you are taking any antibiotics or if you have any conditions that may require antibiotics prior to procedures. (Example: recent joint implants)   Pregnancy: If you are pregnant make sure to notify the nursing staff. Not doing so may result in injury to the fetus, including death.  Sickness: If you have a cold, fever, or any active infections, call and cancel or reschedule your procedure. Receiving steroids while having an infection may result in complications. Arrival: You must be in the facility at least 30 minutes prior to your scheduled procedure. Tardiness: Your scheduled time is also the cutoff time. If you do not arrive at least 15 minutes prior to your procedure, you will be rescheduled.  Children: Do not bring any children  with you. Make arrangements to keep them home. Dress appropriately: There is always a possibility that your clothing may get soiled. Avoid  long dresses. Valuables: Do not bring any jewelry or valuables.  Reasons to call and reschedule or cancel your procedure: (Following these recommendations will minimize the risk of a serious complication.) Surgeries: Avoid having procedures within 2 weeks of any surgery. (Avoid for 2 weeks before or after any surgery). Flu Shots: Avoid having procedures within 2 weeks of a flu shots or . (Avoid for 2 weeks before or after immunizations). Barium: Avoid having a procedure within 7-10 days after having had a radiological study involving the use of radiological contrast. (Myelograms, Barium swallow or enema study). Heart attacks: Avoid any elective procedures or surgeries for the initial 6 months after a Myocardial Infarction (Heart Attack). Blood thinners: It is imperative that you stop these medications before procedures. Let us  know if you if you take any blood thinner.  Infection: Avoid procedures during or within two weeks of an infection (including chest colds or gastrointestinal problems). Symptoms associated with infections include: Localized redness, fever, chills, night sweats or profuse sweating, burning sensation when voiding, cough, congestion, stuffiness, runny nose, sore throat, diarrhea, nausea, vomiting, cold or Flu symptoms, recent or current infections. It is specially important if the infection is over the area that we intend to treat. Heart and lung problems: Symptoms that may suggest an active cardiopulmonary problem include: cough, chest pain, breathing difficulties or shortness of breath, dizziness, ankle swelling, uncontrolled high or unusually low blood pressure, and/or palpitations. If you are experiencing any of these symptoms, cancel your procedure and contact your primary care physician for an evaluation.  Remember:  Regular Business hours are:  Monday to Thursday 8:00 AM to 4:00 PM  Provider's Schedule: Eric Como, MD:  Procedure days: Tuesday and Thursday 7:30  AM to 4:00 PM  Wallie Sherry, MD:  Procedure days: Monday and Wednesday 7:30 AM to 4:00 PM Last  Updated: 04/09/2023 ______________________________________________________________________      ______________________________________________________________________    General Risks and Possible Complications  Patient Responsibilities: It is important that you read this as it is part of your informed consent. It is our duty to inform you of the risks and possible complications associated with treatments offered to you. It is your responsibility as a patient to read this and to ask questions about anything that is not clear or that you believe was not covered in this document.  Patient's Rights: You have the right to refuse treatment. You also have the right to change your mind, even after initially having agreed to have the treatment done. However, under this last option, if you wait until the last second to change your mind, you may be charged for the materials used up to that point.  Introduction: Medicine is not an Visual merchandiser. Everything in Medicine, including the lack of treatment(s), carries the potential for danger, harm, or loss (which is by definition: Risk). In Medicine, a complication is a secondary problem, condition, or disease that can aggravate an already existing one. All treatments carry the risk of possible complications. The fact that a side effects or complications occurs, does not imply that the treatment was conducted incorrectly. It must be clearly understood that these can happen even when everything is done following the highest safety standards.  No treatment: You can choose not to proceed with the proposed treatment alternative. The "PRO(s)" would include: avoiding the risk of complications associated with the therapy. The "CON(s)"  would include: not getting any of the treatment benefits. These benefits fall under one of three categories: diagnostic; therapeutic; and/or  palliative. Diagnostic benefits include: getting information which can ultimately lead to improvement of the disease or symptom(s). Therapeutic benefits are those associated with the successful treatment of the disease. Finally, palliative benefits are those related to the decrease of the primary symptoms, without necessarily curing the condition (example: decreasing the pain from a flare-up of a chronic condition, such as incurable terminal cancer).  General Risks and Complications: These are associated to most interventional treatments. They can occur alone, or in combination. They fall under one of the following six (6) categories: no benefit or worsening of symptoms; bleeding; infection; nerve damage; allergic reactions; and/or death. No benefits or worsening of symptoms: In Medicine there are no guarantees, only probabilities. No healthcare provider can ever guarantee that a medical treatment will work, they can only state the probability that it may. Furthermore, there is always the possibility that the condition may worsen, either directly, or indirectly, as a consequence of the treatment. Bleeding: This is more common if the patient is taking a blood thinner, either prescription or over the counter (example: Goody Powders, Fish oil, Aspirin, Garlic, etc.), or if suffering a condition associated with impaired coagulation (example: Hemophilia, cirrhosis of the liver, low platelet counts, etc.). However, even if you do not have one on these, it can still happen. If you have any of these conditions, or take one of these drugs, make sure to notify your treating physician. Infection: This is more common in patients with a compromised immune system, either due to disease (example: diabetes, cancer, human immunodeficiency virus [HIV], etc.), or due to medications or treatments (example: therapies used to treat cancer and rheumatological diseases). However, even if you do not have one on these, it can still  happen. If you have any of these conditions, or take one of these drugs, make sure to notify your treating physician. Nerve Damage: This is more common when the treatment is an invasive one, but it can also happen with the use of medications, such as those used in the treatment of cancer. The damage can occur to small secondary nerves, or to large primary ones, such as those in the spinal cord and brain. This damage may be temporary or permanent and it may lead to impairments that can range from temporary numbness to permanent paralysis and/or brain death. Allergic Reactions: Any time a substance or material comes in contact with our body, there is the possibility of an allergic reaction. These can range from a mild skin rash (contact dermatitis) to a severe systemic reaction (anaphylactic reaction), which can result in death. Death: In general, any medical intervention can result in death, most of the time due to an unforeseen complication. ______________________________________________________________________     ______________________________________________________________________    Pain Prevention Technique  Definition:   A technique used to minimize the effects of an activity known to cause inflammation or swelling, which in turn leads to an increase in pain.  Purpose: To prevent swelling from occurring. It is based on the fact that it is easier to prevent swelling from happening than it is to get rid of it, once it occurs.  Contraindications: Anyone with allergy or hypersensitivity to the recommended medications. Anyone taking anticoagulants (Blood Thinners) (e.g., Coumadin, Warfarin, Plavix, etc.). Patients in Renal Failure or having chronic kidney disease.  Technique: Before you undertake an activity known to cause pain, or a flare-up of your chronic  pain, and before you experience any pain, do the following:  On a full stomach, take 4 (four) over the counter Ibuprofens 200mg   tablets (Motrin), for a total of 800 mg. In addition, take over the counter Magnesium  400 to 500 mg, before doing the activity.  Six (6) hours later, again on a full stomach, repeat the Ibuprofen. That night, take a warm shower and stretch under the running warm water.  This technique may be sufficient to abort the pain and discomfort before it happens. Keep in mind that it takes a lot less medication to prevent swelling than it takes to eliminate it once it occurs.   Last Update: 11/08/2022  ______________________________________________________________________

## 2024-01-14 DIAGNOSIS — M5417 Radiculopathy, lumbosacral region: Secondary | ICD-10-CM | POA: Diagnosis not present

## 2024-01-14 DIAGNOSIS — M9901 Segmental and somatic dysfunction of cervical region: Secondary | ICD-10-CM | POA: Diagnosis not present

## 2024-01-14 DIAGNOSIS — M5431 Sciatica, right side: Secondary | ICD-10-CM | POA: Diagnosis not present

## 2024-01-14 DIAGNOSIS — M9903 Segmental and somatic dysfunction of lumbar region: Secondary | ICD-10-CM | POA: Diagnosis not present

## 2024-01-21 ENCOUNTER — Telehealth: Payer: Self-pay | Admitting: Pain Medicine

## 2024-01-21 NOTE — Telephone Encounter (Signed)
 Patient states she always gets sedation. She wants sedation, and wants to have Caudal Epid done in the morning time.   Please ask Dr Naveira if she can have sedation and be changed to ECT

## 2024-01-27 DIAGNOSIS — D0461 Carcinoma in situ of skin of right upper limb, including shoulder: Secondary | ICD-10-CM | POA: Diagnosis not present

## 2024-01-28 ENCOUNTER — Encounter: Payer: Self-pay | Admitting: Podiatry

## 2024-01-28 ENCOUNTER — Ambulatory Visit: Admitting: Podiatry

## 2024-01-28 DIAGNOSIS — D2371 Other benign neoplasm of skin of right lower limb, including hip: Secondary | ICD-10-CM | POA: Diagnosis not present

## 2024-01-28 DIAGNOSIS — M19071 Primary osteoarthritis, right ankle and foot: Secondary | ICD-10-CM | POA: Diagnosis not present

## 2024-01-28 MED ORDER — TRIAMCINOLONE ACETONIDE 40 MG/ML IJ SUSP
20.0000 mg | Freq: Once | INTRAMUSCULAR | Status: AC
  Administered 2024-01-28: 20 mg

## 2024-01-28 NOTE — Progress Notes (Signed)
 She presents today chief complaint of pain between her 2nd and 3rd toes of her right foot.  She states that it bothered her for few weeks now.  Objective: Vital signs are stable she is alert and oriented x 3.  Pulses are palpable.  She has a very prominent third metatarsal head of the right foot with rigid hammertoe deformity third digit right foot.  Assessment: Capsulitis of the third metatarsal phalangeal joint.  Plan: I injected between the 2nd and 3rd toes today with 5 mg Kenalog  5 mg Marcaine for maximal tenderness.  Toller procedure well without complications we will follow-up with her on an as-needed basis.

## 2024-02-11 ENCOUNTER — Other Ambulatory Visit: Payer: Self-pay | Admitting: Family Medicine

## 2024-02-11 DIAGNOSIS — M9903 Segmental and somatic dysfunction of lumbar region: Secondary | ICD-10-CM | POA: Diagnosis not present

## 2024-02-11 DIAGNOSIS — Z1231 Encounter for screening mammogram for malignant neoplasm of breast: Secondary | ICD-10-CM

## 2024-02-11 DIAGNOSIS — M5417 Radiculopathy, lumbosacral region: Secondary | ICD-10-CM | POA: Diagnosis not present

## 2024-02-11 DIAGNOSIS — M5431 Sciatica, right side: Secondary | ICD-10-CM | POA: Diagnosis not present

## 2024-02-11 DIAGNOSIS — M9901 Segmental and somatic dysfunction of cervical region: Secondary | ICD-10-CM | POA: Diagnosis not present

## 2024-02-17 ENCOUNTER — Ambulatory Visit: Admitting: Podiatry

## 2024-02-18 ENCOUNTER — Ambulatory Visit: Admitting: Pain Medicine

## 2024-02-19 ENCOUNTER — Ambulatory Visit: Admitting: Podiatry

## 2024-02-20 ENCOUNTER — Ambulatory Visit: Admitting: Pain Medicine

## 2024-02-21 ENCOUNTER — Other Ambulatory Visit: Payer: Self-pay | Admitting: Family Medicine

## 2024-02-21 DIAGNOSIS — E1169 Type 2 diabetes mellitus with other specified complication: Secondary | ICD-10-CM

## 2024-02-22 NOTE — Telephone Encounter (Signed)
 Requested Prescriptions  Pending Prescriptions Disp Refills   ezetimibe  (ZETIA ) 10 MG tablet [Pharmacy Med Name: EZETIMIBE  10MG  TABLET] 90 tablet 1    Sig: TAKE ONE TABLET BY MOUTH TWO TIMES A WEEK     Cardiovascular:  Antilipid - Sterol Transport Inhibitors Failed - 02/22/2024 10:12 PM      Failed - Lipid Panel in normal range within the last 12 months    Cholesterol, Total  Date Value Ref Range Status  09/16/2023 209 (H) 100 - 199 mg/dL Final   LDL Chol Calc (NIH)  Date Value Ref Range Status  09/16/2023 129 (H) 0 - 99 mg/dL Final   HDL  Date Value Ref Range Status  09/16/2023 50 >39 mg/dL Final   Triglycerides  Date Value Ref Range Status  09/16/2023 170 (H) 0 - 149 mg/dL Final         Passed - AST in normal range and within 360 days    AST  Date Value Ref Range Status  09/16/2023 26 0 - 40 IU/L Final         Passed - ALT in normal range and within 360 days    ALT  Date Value Ref Range Status  09/16/2023 20 0 - 32 IU/L Final         Passed - Patient is not pregnant      Passed - Valid encounter within last 12 months    Recent Outpatient Visits           5 months ago Hypertension associated with diabetes [facility])   [facility] Bacigalupo, Jon HERO, MD       Future Appointments             In 1 month Bacigalupo, Jon HERO, MD [facility], [place]             oxybutynin  (DITROPAN -XL) 5 MG 24 hr tablet [Pharmacy Med Name: OXYBUTYNIN  CHLORIDE ER 5MG  ER TABLET ER 24HR] 90 tablet 0    Sig: TAKE ONE TABLET (5 MG TOTAL) BY MOUTH AT BEDTIME.     Urology:  Bladder Agents Passed - 02/22/2024 10:12 PM      Passed - Valid encounter within last 12 months    Recent Outpatient Visits           5 months ago Hypertension associated with diabetes [facility])   [facility] [place], Jon HERO, MD       Future Appointments             In 1 month Bacigalupo, Jon HERO, MD [facility]  [facility], [place]

## 2024-02-27 ENCOUNTER — Ambulatory Visit
Admission: RE | Admit: 2024-02-27 | Discharge: 2024-02-27 | Disposition: A | Discharge status: Home / Self Care | Source: Ambulatory Visit | Attending: Pain Medicine | Admitting: Pain Medicine

## 2024-02-27 ENCOUNTER — Ambulatory Visit (HOSPITAL_BASED_OUTPATIENT_CLINIC_OR_DEPARTMENT_OTHER): Admitting: Pain Medicine

## 2024-02-27 ENCOUNTER — Encounter: Payer: Self-pay | Admitting: Pain Medicine

## 2024-02-27 VITALS — BP 141/74 | HR 56 | Temp 98.0°F | Resp 16 | Ht 62.0 in | Wt 149.0 lb

## 2024-02-27 DIAGNOSIS — R200 Anesthesia of skin: Secondary | ICD-10-CM | POA: Diagnosis present

## 2024-02-27 DIAGNOSIS — M4726 Other spondylosis with radiculopathy, lumbar region: Secondary | ICD-10-CM

## 2024-02-27 DIAGNOSIS — M5441 Lumbago with sciatica, right side: Secondary | ICD-10-CM | POA: Insufficient documentation

## 2024-02-27 DIAGNOSIS — M79604 Pain in right leg: Secondary | ICD-10-CM

## 2024-02-27 DIAGNOSIS — M4310 Spondylolisthesis, site unspecified: Secondary | ICD-10-CM

## 2024-02-27 DIAGNOSIS — G8929 Other chronic pain: Secondary | ICD-10-CM

## 2024-02-27 DIAGNOSIS — M545 Low back pain, unspecified: Secondary | ICD-10-CM | POA: Diagnosis present

## 2024-02-27 DIAGNOSIS — M79605 Pain in left leg: Secondary | ICD-10-CM | POA: Diagnosis present

## 2024-02-27 DIAGNOSIS — M51372 Other intervertebral disc degeneration, lumbosacral region with discogenic back pain and lower extremity pain: Secondary | ICD-10-CM | POA: Diagnosis present

## 2024-02-27 DIAGNOSIS — M5417 Radiculopathy, lumbosacral region: Secondary | ICD-10-CM | POA: Diagnosis present

## 2024-02-27 DIAGNOSIS — M5416 Radiculopathy, lumbar region: Secondary | ICD-10-CM | POA: Insufficient documentation

## 2024-02-27 DIAGNOSIS — R202 Paresthesia of skin: Secondary | ICD-10-CM | POA: Insufficient documentation

## 2024-02-27 DIAGNOSIS — M5442 Lumbago with sciatica, left side: Secondary | ICD-10-CM

## 2024-02-27 MED ORDER — PENTAFLUOROPROP-TETRAFLUOROETH EX AERO: INHALATION_SPRAY | Freq: Once | CUTANEOUS | Status: AC

## 2024-02-27 MED ORDER — MIDAZOLAM HCL (PF) 2 MG/2ML IJ SOLN
0.5000 mg | Freq: Once | INTRAMUSCULAR | Status: AC
  Administered 2024-02-27: 2 mg via INTRAVENOUS
  Filled 2024-02-27: qty 2

## 2024-02-27 MED ORDER — SODIUM CHLORIDE 0.9% FLUSH
2.0000 mL | Freq: Once | INTRAVENOUS | Status: AC
  Administered 2024-02-27: 2 mL

## 2024-02-27 MED ORDER — ROPIVACAINE HCL 2 MG/ML IJ SOLN
2.0000 mL | Freq: Once | INTRAMUSCULAR | Status: AC
  Administered 2024-02-27: 2 mL via EPIDURAL
  Filled 2024-02-27: qty 20

## 2024-02-27 MED ORDER — IOHEXOL 180 MG/ML  SOLN
10.0000 mL | Freq: Once | INTRAMUSCULAR | Status: AC
  Administered 2024-02-27: 10 mL via EPIDURAL
  Filled 2024-02-27: qty 20

## 2024-02-27 MED ORDER — LIDOCAINE HCL 2 % IJ SOLN
20.0000 mL | Freq: Once | INTRAMUSCULAR | Status: AC
  Administered 2024-02-27: 400 mg
  Filled 2024-02-27: qty 20

## 2024-02-27 MED ORDER — TRIAMCINOLONE ACETONIDE 40 MG/ML IJ SUSP
40.0000 mg | Freq: Once | INTRAMUSCULAR | Status: AC
  Administered 2024-02-27: 40 mg
  Filled 2024-02-27: qty 1

## 2024-02-27 NOTE — Progress Notes (Signed)
 PROVIDER NOTE: Interpretation of information contained herein should be left to medically-trained personnel. Specific patient instructions are provided elsewhere under Patient Instructions section of medical record. This document was created in part using STT-dictation technology, any transcriptional errors that may result from this process are unintentional.  Patient: Ashley Rodriguez Type: Established DOB: 1950-10-03 MRN: [id number] PCP: Myrla Jon HERO, MD  Service: Procedure DOS: 02/27/2024 Setting: Ambulatory Location: Ambulatory outpatient facility Delivery: Face-to-face Provider: Eric DELENA Como, MD Specialty: Interventional Pain Management Specialty designation: 09 Location: Outpatient facility Ref. Prov.: Bacigalupo, Jon HERO, MD       Interventional Therapy   Type: Caudal Epidural Steroid Injection   #2  Laterality: Midline aiming right Level: Sacrococcygeal ligament  Imaging: Fluoroscopy-guided Spinal ([id number]) Anesthesia: Local anesthesia (1-2% Lidocaine ) Anxiolysis: IV Versed  2.0 mg Sedation: No Sedation                       DOS: 02/27/2024  Performed by: Eric DELENA Como, MD  Purpose: Diagnostic/Therapeutic Indications: Low back and lower extremity pain severe enough to impact quality of life or function. Rationale (medical necessity): procedure needed and proper for the diagnosis and/or treatment of Ashley Rodriguez's medical symptoms and needs. 1. Chronic low back pain (1ry area of Pain) (Bilateral) (R>L) w/ sciatica (Bilateral)   2. Chronic lower extremity pain (2ry area of Pain) (Bilateral) (R>L)   3. Chronic lower lumbar polyradiculopathy (Right)   4. Degeneration of intervertebral disc of lumbosacral region with discogenic back pain and lower extremity pain   5. Grade 1 Anterolisthesis of L5 over S1   6. Intermittent low back pain   7. L5 pars defect with spondylolisthesis (Bilateral)   8. Low back pain of over 3 months duration   9. Low back pain  radiating to both legs   10. Lumbar spondylosis w/ polyradiculopathy (Right)   11. Lumbosacral radiculopathy (Right)   12. Lumbosacral radiculopathy at L5 (Right)   13. Numbness and tingling of foot   14. Numbness and tingling (lower extremities) (Bilateral)    NAS-11 Pain score:   Pre-procedure: 9 /10   Post-procedure: 3 /10     Target: Lumbosacral epidural canal  Location: Epidural space  Region: Caudal canal Approach: Percutaneous  Type of procedure: Epidural block  Position  Prep  Materials:  Position: Prone  Prep solution: ChloraPrep (2% chlorhexidine gluconate and 70% isopropyl alcohol) Prep Area: Entire posterior lumbosacral area  Materials:  Tray: Epidural Needle(s):  Type: Epidural  Gauge (G): 17  Length: 3.5-in  Qty: 1  H&P (Pre-op Assessment):  Ashley Rodriguez is a 73 y.o. (year old), female patient, seen today for interventional treatment. She  has a past surgical history that includes Supracervical abdominal hysterectomy (04/30/1998); lipoma removed (04/30/2012); Colonoscopy (04/30/2013); Neck mass excision (04/28/2014); Tubal ligation (1981); and Abdominal hysterectomy (2000). Ashley Rodriguez has a current medication list which includes the following prescription(s): acetaminophen , amlodipine , atenolol, betamethasone dipropionate, calcium  carbonate, co-enzyme q-10, enbrel sureclick, esomeprazole , ezetimibe , fexofenadine, fluticasone , gabapentin , losartan , montelukast , multivitamin with minerals, oxybutynin , turmeric, and valacyclovir, and the following Facility-Administered Medications: iohexol , lidocaine , midazolam  pf, pentafluoroprop-tetrafluoroeth, ropivacaine  (pf) 2 mg/ml (0.2%), sodium chloride  flush, triamcinolone  acetonide, and triamcinolone  acetonide. Her primarily concern today is the Back Pain (lower)  Initial Vital Signs:  Pulse/HCG Rate: (!) 56ECG Heart Rate: 60 Temp: 98 F (36.7 C) Resp: 16 BP: (!) 170/84 SpO2: 98 %  BMI: Estimated body mass index is 27.25  kg/m as calculated from the following:   Height as of this encounter: 5' 2 (  1.575 m).   Weight as of this encounter: 149 lb (67.6 kg).  Risk Assessment: Allergies: Reviewed. She is allergic to atorvastatin , sulfamethoxazole-trimethoprim, and codeine.  Allergy Precautions: None required Coagulopathies: Reviewed. None identified.  Blood-thinner therapy: None at this time Active Infection(s): Reviewed. None identified. Ashley Rodriguez is afebrile  Site Confirmation: Ashley Rodriguez was asked to confirm the procedure and laterality before marking the site Procedure checklist: Completed Consent: Before the procedure and under the influence of no sedative(s), amnesic(s), or anxiolytics, the patient was informed of the treatment options, risks and possible complications. To fulfill our ethical and legal obligations, as recommended by the American Medical Association's Code of Ethics, I have informed the patient of my clinical impression; the nature and purpose of the treatment or procedure; the risks, benefits, and possible complications of the intervention; the alternatives, including doing nothing; the risk(s) and benefit(s) of the alternative treatment(s) or procedure(s); and the risk(s) and benefit(s) of doing nothing. The patient was provided information about the general risks and possible complications associated with the procedure. These may include, but are not limited to: failure to achieve desired goals, infection, bleeding, organ or nerve damage, allergic reactions, paralysis, and death. In addition, the patient was informed of those risks and complications associated to Spine-related procedures, such as failure to decrease pain; infection (i.e.: Meningitis, epidural or intraspinal abscess); bleeding (i.e.: epidural hematoma, subarachnoid hemorrhage, or any other type of intraspinal or peri-dural bleeding); organ or nerve damage (i.e.: Any type of peripheral nerve, nerve root, or spinal cord injury) with  subsequent damage to sensory, motor, and/or autonomic systems, resulting in permanent pain, numbness, and/or weakness of one or several areas of the body; allergic reactions; (i.e.: anaphylactic reaction); and/or death. Furthermore, the patient was informed of those risks and complications associated with the medications. These include, but are not limited to: allergic reactions (i.e.: anaphylactic or anaphylactoid reaction(s)); adrenal axis suppression; blood sugar elevation that in diabetics may result in ketoacidosis or comma; water retention that in patients with history of congestive heart failure may result in shortness of breath, pulmonary edema, and decompensation with resultant heart failure; weight gain; swelling or edema; medication-induced neural toxicity; particulate matter embolism and blood vessel occlusion with resultant organ, and/or nervous system infarction; and/or aseptic necrosis of one or more joints. Finally, the patient was informed that Medicine is not an exact science; therefore, there is also the possibility of unforeseen or unpredictable risks and/or possible complications that may result in a catastrophic outcome. The patient indicated having understood very clearly. We have given the patient no guarantees and we have made no promises. Enough time was given to the patient to ask questions, all of which were answered to the patient's satisfaction. Ms. Schall has indicated that she wanted to continue with the procedure. Attestation: I, the ordering provider, attest that I have discussed with the patient the benefits, risks, side-effects, alternatives, likelihood of achieving goals, and potential problems during recovery for the procedure that I have provided informed consent. Date  Time: 02/27/2024 12:32 PM  Imaging Guidance (Spinal):          Type of Imaging Technique: Fluoroscopy Guidance (Spinal) Indication(s): Fluoroscopy guidance for needle placement to enhance accuracy in  procedures requiring precise needle localization for targeted delivery of medication in or near specific anatomical locations not easily accessible without such real-time imaging assistance. Exposure Time: Please see nurses notes. Contrast: Before injecting any contrast, we confirmed that the patient did not have an allergy to iodine, shellfish, or radiological  contrast. Once satisfactory needle placement was completed at the desired level, radiological contrast was injected. Contrast injected under live fluoroscopy. No contrast complications. See chart for type and volume of contrast used. Fluoroscopic Guidance: I was personally present during the use of fluoroscopy. Tunnel Vision Technique used to obtain the best possible view of the target area. Parallax error corrected before commencing the procedure. Direction-depth-direction technique used to introduce the needle under continuous pulsed fluoroscopy. Once target was reached, antero-posterior, oblique, and lateral fluoroscopic projection used confirm needle placement in all planes. Images permanently stored in EMR. Interpretation: I personally interpreted the imaging intraoperatively. Adequate needle placement confirmed in multiple planes. Appropriate spread of contrast into desired area was observed. No evidence of afferent or efferent intravascular uptake. No intrathecal or subarachnoid spread observed. Permanent images saved into the patient's record.  Pre-Procedure Preparation:  Monitoring: As per clinic protocol. Respiration, ETCO2, SpO2, BP, heart rate and rhythm monitor placed and checked for adequate function Safety Precautions: Patient was assessed for positional comfort and pressure points before starting the procedure. Time-out: I initiated and conducted the Time-out before starting the procedure, as per protocol. The patient was asked to participate by confirming the accuracy of the Time Out information. Verification of the correct  person, site, and procedure were performed and confirmed by me, the nursing staff, and the patient. Time-out conducted as per Joint Commission's Universal Protocol (UP.01.01.01). Time: 1310 Start Time: 1310 hrs.  Description  Narrative of Procedure:          Start Time: 1310 hrs.  Technical description of procedure:  Safety Precautions: Aspiration looking for blood return was conducted prior to all injections. At no point did we inject any substances, as a needle was being advanced. No attempts were made at seeking any paresthesias. Safe injection practices and needle disposal techniques used. Medications properly checked for expiration dates. SDV (single dose vial) medications used. Description of the Procedure: Protocol guidelines were followed. The patient was placed in position over the fluoroscopy table. The target area was identified and the area prepped in the usual manner. Skin & deeper tissues infiltrated with local anesthetic. Appropriate amount of time allowed to pass for local anesthetics to take effect. The procedure needle was then advanced to the target area. Proper needle placement secured. Negative aspiration confirmed.  Solution injected in intermittent fashion, asking for systemic symptoms every 0.5cc of injectate. The needle/catheter were removed and the area cleansed, making sure to leave some of the prepping solution back to take advantage of its long term bactericidal properties.  Vitals:   02/27/24 1231 02/27/24 1305 02/27/24 1310 02/27/24 1324  BP: (!) 170/84 (!) 151/83 (!) 152/79 (!) 141/74  Pulse: (!) 56   (!) 56  Resp: 16 15 16 16   Temp: 98 F (36.7 C)     TempSrc: Temporal     SpO2: 98% 98% 99% 100%  Weight: 149 lb (67.6 kg)     Height: 5' 2 (1.575 m)        End Time: 1315 hrs.  Post-operative Assessment:  Post-procedure Vital Signs:  Pulse/HCG Rate: (!) 5663 Temp: 98 F (36.7 C) Resp: 16 BP: (!) 141/74 SpO2: 100 %  EBL: None  Complications: No  immediate post-treatment complications observed by team, or reported by patient.  Note: The patient tolerated the entire procedure well. A repeat set of vitals were taken after the procedure and the patient was kept under observation following institutional policy, for this type of procedure. Post-procedural neurological assessment was performed, showing  return to baseline, prior to discharge. The patient was provided with post-procedure discharge instructions, including a section on how to identify potential problems. Should any problems arise concerning this procedure, the patient was given instructions to immediately contact us , at any time, without hesitation. In any case, we plan to contact the patient by telephone for a follow-up status report regarding this interventional procedure.  Comments:  No additional relevant information.  Plan of Care (POC)  Orders:  Orders Placed This Encounter  Procedures   Caudal Epidural Injection    Scheduling Instructions:     Laterality: Right-sided     Level(s): Sacrococcygeal canal (Tailbone area)     Sedation: Patient's choice     Date: 02/27/2024    Where will this procedure be performed?:   [facility]   [facility] C-ARM 1-60 MIN NO REPORT    Intraoperative interpretation by procedural physician at [facility].    Standing Status:   Standing    Number of Occurrences:   1    Reason for exam::   Assistance in needle guidance and placement for procedures requiring needle placement in or near specific anatomical locations not easily accessible without such assistance.   Informed Consent Details: Physician/Practitioner Attestation; Transcribe to consent form and obtain patient signature    Nursing Order: Transcribe to consent form and obtain patient signature. Note: Always confirm laterality of pain with Ms. Joesph, before procedure.    Physician/Practitioner attestation of informed consent for procedure/surgical case:   I, the  physician/practitioner, attest that I have discussed with the patient the benefits, risks, side effects, alternatives, likelihood of achieving goals and potential problems during recovery for the procedure that I have provided informed consent.    Procedure:   Caudal epidural steroid injection    Physician/Practitioner performing the procedure:   Karlea Mckibbin A. Tanya, MD    Indication/Reason:   Low back pain and lower extremity pain secondary to lumbosacral radiculitis   Provide equipment / supplies at bedside    Procedural tray: Epidural Tray (Disposable  single use) Skin infiltration needle: Regular 1.5-in, 25-G, (x1) Block needle size: Regular standard Catheter: No catheter required    Standing Status:   Standing    Number of Occurrences:   1    Specify:   Epidural Tray   Saline lock IV    Have LR [phone] available and administer at 125 mL/hr if patient becomes hypotensive.    Standing Status:   Standing    Number of Occurrences:   1    Opioid Analgesic:  No opioid analgesics from our practice.   Medications ordered for procedure: Meds ordered this encounter  Medications   iohexol  (OMNIPAQUE ) 180 MG/ML injection 10 mL    Must be Myelogram-compatible. If not available, you may substitute with a water-soluble, non-ionic, hypoallergenic, myelogram-compatible radiological contrast medium.   lidocaine  (XYLOCAINE ) 2 % (with pres) injection 400 mg   pentafluoroprop-tetrafluoroeth (GEBAUERS) aerosol   midazolam  PF (VERSED ) injection 0.5-2 mg    Make sure Flumazenil is available in the pyxis when using this medication. If oversedation occurs, administer 0.2 mg IV over 15 sec. If after 45 sec no response, administer 0.2 mg again over 1 min; may repeat at 1 min intervals; not to exceed 4 doses (1 mg)   sodium chloride  flush (NS) 0.9 % injection 2 mL   ropivacaine  (PF) 2 mg/mL (0.2%) (NAROPIN ) injection 2 mL   triamcinolone  acetonide (KENALOG -40) injection 40 mg   Medications  administered: Mallie Cake  had no medications administered during this visit.  See the medical record for exact dosing, route, and time of administration.    Interventional Therapies  Risk Factors  Considerations:  [employer] DENIED L-MRI (02/17/19)    Planned  Pending:   Therapeutic right caudal ESI #2 (PRN)    Under consideration:   Diagnostic/therapeutic right caudal ESI #2  Therapeutic right lumbar facet RFA #2  Therapeutic left lumbar facet RFA #2  Therapeutic right L4-5 LESI #3    Completed:   Diagnostic/therapeutic midline-right caudal ESI x1 (09/19/2023) (100/50/85/85)  Diagnostic/therapeutic right L4-5 LESI x2 (08/01/2023) (100/50/50/50 x2 weeks)  Therapeutic bilateral lumbar facet MBB x7 (04/10/2022) (100/100/95/90-100/duration of up to 8 months)  Therapeutic bilateral SI Blk x4 (11/09/2021) (100/100/80) (100/100/100/50)  Therapeutic right (L4-5, L5-S1) lumbar facet RFA x2 (07/10/2022) (100/100/75/75) (first 1 lasted 3 years)  Therapeutic left lumbar facet RFA x1 (08/06/2019) (100/100/75/75)  Therapeutic right lumbar facet RFA x1 (07/10/2022) ( (100/100/75/75)  Therapeutic right SI RFA x1 (03/17/2020) (100/100/100/90-100)  Therapeutic left SI RFA x1 (03/31/2020) (100/100/100 x3 weeks/70)    Completed by other providers:      Therapeutic  Palliative (PRN) options:   Palliative lumbar facet MBB  Therapeutic lumbar facet RFA  Therapeutic SI RFA  Palliative SI joint Blk  Therapeutic L4-5 LESI       Follow-up plan:   Return in about 2 weeks (around 03/12/2024) for (Face2F), (PPE).     Recent Visits Date Type Provider Dept  12/16/23 Office Visit Tanya Glisson, MD [facility]  Showing recent visits within past 90 days and meeting all other requirements Today's Visits Date Type Provider Dept  02/27/24 Procedure visit Tanya Glisson, MD [facility]  Showing today's visits and meeting all other requirements Future Appointments Date Type  Provider Dept  03/23/24 Appointment Tanya Glisson, MD [facility]  Showing future appointments within next 90 days and meeting all other requirements   Disposition: Discharge home  Discharge (Date  Time): 02/27/2024; 1328 hrs.   Primary Care Physician: Myrla Jon HERO, MD Location: [facility] Note by: Glisson DELENA Tanya, MD (TTS technology used. I apologize for any typographical errors that were not detected and corrected.) Date: 02/27/2024; Time: 1:57 PM  Disclaimer:  Medicine is not an visual merchandiser. The only guarantee in medicine is that nothing is guaranteed. It is important to note that the decision to proceed with this intervention was based on the information collected from the patient. The Data and conclusions were drawn from the patient's questionnaire, the interview, and the physical examination. Because the information was provided in large part by the patient, it cannot be guaranteed that it has not been purposely or unconsciously manipulated. Every effort has been made to obtain as much relevant data as possible for this evaluation. It is important to note that the conclusions that lead to this procedure are derived in large part from the available data. Always take into account that the treatment will also be dependent on availability of resources and existing treatment guidelines, considered by other Pain Management Practitioners as being common knowledge and practice, at the time of the intervention. For Medico-Legal purposes, it is also important to point out that variation in procedural techniques and pharmacological choices are the acceptable norm. The indications, contraindications, technique, and results of the above procedure should only be interpreted and judged by a Board-Certified Interventional Pain Specialist with extensive familiarity and expertise in the same exact procedure and technique.

## 2024-02-27 NOTE — Patient Instructions (Signed)
 ______________________________________________________________________    Post-Procedure Discharge Instructions  INSTRUCTIONS Apply ice:  Purpose: This will minimize any swelling and discomfort after procedure.  When: Day of procedure, as soon as you get home. How: Fill a plastic sandwich bag with crushed ice. Cover it with a small towel and apply to injection site. How long: (15 min on, 15 min off) Apply for 15 minutes then remove x 15 minutes.  Repeat sequence on day of procedure, until you go to bed. Apply heat:  Purpose: To treat any soreness and discomfort from the procedure. When: Starting the next day after the procedure. How: Apply heat to procedure site starting the day following the procedure. How long: May continue to repeat daily, until discomfort goes away. Food intake: Start with clear liquids (like water) and advance to regular food, as tolerated.  Physical activities: Keep activities to a minimum for the first 8 hours after the procedure. After that, then as tolerated. Driving: If you have received any sedation, be responsible and do not drive. You are not allowed to drive for 24 hours after having sedation. Blood thinner: (Applies only to those taking blood thinners) You may restart your blood thinner 6 hours after your procedure. Insulin: (Applies only to Diabetic patients taking insulin) As soon as you can eat, you may resume your normal dosing schedule. Infection prevention: Keep procedure site clean and dry. Shower daily and clean area with soap and water.  PAIN DIARY Post-procedure Pain Diary: Extremely important that this be done correctly and accurately. Recorded information will be used to determine the next step in treatment. For the purpose of accuracy, follow these rules: Evaluate only the area treated. Do not report or include pain from an untreated area. For the purpose of this evaluation, ignore all other areas of pain, except for the treated area. After your  procedure, avoid taking a long nap and attempting to complete the pain diary after you wake up. Instead, set your alarm clock to go off every hour, on the hour, for the initial 8 hours after the procedure. Document the duration of the numbing medicine, and the relief you are getting from it. Do not go to sleep and attempt to complete it later. It will not be accurate. If you received sedation, it is likely that you were given a medication that may cause amnesia. Because of this, completing the diary at a later time may cause the information to be inaccurate. This information is needed to plan your care. Follow-up appointment: Keep your post-procedure follow-up evaluation appointment after the procedure (usually 2 weeks for most procedures, 6 weeks for radiofrequencies). DO NOT FORGET to bring you pain diary with you.   EXPECT... (What should I expect to see with my procedure?) From numbing medicine (AKA: Local Anesthetics): Numbness or decrease in pain. You may also experience some weakness, which if present, could last for the duration of the local anesthetic. Onset: Full effect within 15 minutes of injected. Duration: It will depend on the type of local anesthetic used. On the average, 1 to 8 hours.  From steroids (Applies only if steroids were used): Decrease in swelling or inflammation. Once inflammation is improved, relief of the pain will follow. Onset of benefits: Depends on the amount of swelling present. The more swelling, the longer it will take for the benefits to be seen. In some cases, up to 10 days. Duration: Steroids will stay in the system x 2 weeks. Duration of benefits will depend on multiple posibilities including persistent irritating  factors. Side-effects: If present, they may typically last 2 weeks (the duration of the steroids). Frequent: Cramps (if they occur, drink Gatorade and take over-the-counter Magnesium 450-500 mg once to twice a day); water retention with temporary weight  gain; increases in blood sugar; decreased immune system response; increased appetite. Occasional: Facial flushing (red, warm cheeks); mood swings; menstrual changes. Uncommon: Long-term decrease or suppression of natural hormones; bone thinning. (These are more common with higher doses or more frequent use. This is why we prefer that our patients avoid having any injection therapies in other practices.)  Very Rare: Severe mood changes; psychosis; aseptic necrosis. From procedure: Some discomfort is to be expected once the numbing medicine wears off. This should be minimal if ice and heat are applied as instructed.  CALL IF... (When should I call?) You experience numbness and weakness that gets worse with time, as opposed to wearing off. New onset bowel or bladder incontinence. (Applies only to procedures done in the spine)  Emergency Numbers: Durning business hours (Monday - Thursday, 8:00 AM - 4:00 PM) (Friday, 9:00 AM - 12:00 Noon): [phone] After hours: [phone] NOTE: If you are having a problem and are unable connect with, or to talk to a provider, then go to your nearest urgent care or emergency department. If the problem is serious and urgent, please call 911. ______________________________________________________________________     ______________________________________________________________________    Steroid injections  Common steroids for injections Triamcinolone : Used by many sports medicine physicians for large joint and bursal injections, often combined with a local anesthetic like lidocaine . A study focusing on coccydynia (tailbone pain) found triamcinolone  was more effective than betamethasone, suggesting it may also be preferable for other localized inflammation conditions. Methylprednisolone: A common alternative to triamcinolone  that is also a strong anti-inflammatory. It is available in different formulations, with the acetate suspension being the long-acting  option for intra-articular injections. Dexamethasone : This is a non-particulate steroid, meaning it has a lower risk of tissue damage compared to particulate steroids like triamcinolone  and methylprednisolone. While less common for this specific use, it is an option for targeted injections.   Considerations for physicians Particulate vs. non-particulate steroids: Triamcinolone  and methylprednisolone are particulate, meaning they can clump together. Dexamethasone  is non-particulate. Particulate steroids are often preferred for their longer-lasting effects but carry a theoretical higher risk for certain injections (though this is less of a concern in the costochondral joints). Combined injectate: Corticosteroids are typically mixed with a local anesthetic like lidocaine  to provide both immediate pain relief (from the anesthetic) and longer-term inflammation reduction (from the steroid). Imaging guidance: To ensure accurate placement of the needle and medication, physicians may use ultrasound or fluoroscopic guidance for the injection, especially in complex or refractory cases.   Patient guidance Before undergoing a steroid injection, discuss the options with your physician. They will determine the best steroid, dosage, and procedure for your specific case based on factors like: Severity of your condition History of response to other treatments Your overall health status Experience and preference of the physician  Last  Updated: 12/24/2023 ______________________________________________________________________

## 2024-02-28 ENCOUNTER — Telehealth: Payer: Self-pay

## 2024-02-28 NOTE — Telephone Encounter (Signed)
 Post procedure follow up.  Patient states she is doing good.

## 2024-03-15 NOTE — Progress Notes (Signed)
 PROVIDER NOTE: Interpretation of information contained herein should be left to medically-trained personnel. Specific patient instructions are provided elsewhere under Patient Instructions section of medical record. This document was created in part using AI and STT-dictation technology, any transcriptional errors that may result from this process are unintentional.  Patient: Lawrnce Rodriguez  Service: E/M   PCP: Myrla Jon HERO, MD  DOB: [birth year]  DOS: 03/23/2024  Provider: Eric DELENA Como, MD  MRN: [id number]  Delivery: Face-to-face  Specialty: Interventional Pain Management  Type: Established Patient  Setting: Ambulatory outpatient facility  Specialty designation: 09  Referring Prov.: Bacigalupo, Jon HERO, MD  Location: Outpatient office facility       History of present illness (HPI) Ashley Rodriguez, a 73 y.o. year old female, is here today because of her Chronic bilateral low back pain with bilateral sciatica [M54.42, M54.41, G89.29]. Ashley Rodriguez primary complain today is Back Pain (lower)  Pertinent problems: Ashley Rodriguez has Psoriatic arthritis (HCC); Chronic low back pain (1ry area of Pain) (Bilateral) (R>L) w/ sciatica (Bilateral); Chronic lower extremity pain (2ry area of Pain) (Bilateral) (R>L); Grade 1 Anterolisthesis of L5 over S1; L5 pars defect with spondylolisthesis (Bilateral); DDD (degenerative disc disease), lumbar; Lumbar facet syndrome (Bilateral) (R>L); Numbness and tingling (lower extremities) (Bilateral); Chronic pain syndrome; Chronic musculoskeletal pain; Neurogenic pain; Chronic sacroiliac joint pain (Bilateral) (R>L); Osteoarthritis of lumbar spine; Spondylosis without myelopathy or radiculopathy, lumbosacral region; Peripheral neuropathy; Generalized osteoarthritis of hand; Numbness and tingling of foot; Low back pain radiating to both legs; Abnormal EMG (chronic right lower lumbar polyradiculopathy); Chronic lower lumbar polyradiculopathy (Right); DDD (degenerative  disc disease), lumbosacral; Lumbar spondylosis w/ polyradiculopathy (Right); Lumbosacral radiculopathy (Right); Spondylolisthesis of lumbosacral region; Other spondylosis, sacral and sacrococcygeal region; Somatic dysfunction of sacroiliac joints (Bilateral); Osteoarthritis of sacroiliac joints (Bilateral); Myalgia due to statin; Chronic sacroiliac joint pain (Right); Osteoarthritis of sacroiliac joint (HCC) (Right); Enthesopathy of sacroiliac joint; Chronic low back pain (Bilateral) w/o sciatica; Chronic sacroiliac joint pain (Left); Osteoarthritis of sacroiliac joint (Left) (HCC); Somatic dysfunction of sacroiliac joint (Left); Sciatica, left side; Lumbar facet joint pain; Lumbosacral radiculopathy at L5 (Right); Neuralgia of left sciatic nerve; Primary osteoarthritis; Abnormal CT scan, lumbar spine (09/18/2023); Primary osteoarthritis involving multiple joints; Low back pain of over 3 months duration; Intermittent low back pain; Multifactorial low back pain; Chronic hip pain (Right); and Posterior pain of hip (Right) on their pertinent problem list.  Pain Assessment: Severity of Chronic pain is reported as a 1 /10. Location: Back Lower/tingling in right leg. Onset: More than a month ago. Quality: Tingling, Other (Comment), Tightness (pulling). Timing: Constant. Modifying factor(s): activity, Ibuprofen, Acetaminophen . Vitals:  height is 5' 2 (1.575 m) and weight is 144 lb (65.3 kg). Her temporal temperature is 97.2 F (36.2 C) (abnormal). Her blood pressure is 157/81 (abnormal) and her pulse is 56 (abnormal). Her respiration is 16 and oxygen saturation is 97%.  BMI: Estimated body mass index is 26.34 kg/m as calculated from the following:   Height as of this encounter: 5' 2 (1.575 m).   Weight as of this encounter: 144 lb (65.3 kg).  Last encounter: 12/16/2023. Last procedure: 02/27/2024.  Reason for encounter: post-procedure evaluation and assessment.   Discussed the use of AI scribe software for  clinical note transcription with the patient, who gave verbal consent to proceed.  History of Present Illness   Ashley Rodriguez is a 73 year old female who presents for follow-up after her second midline caudal epidural steroid injection.  She experienced 75% pain  relief immediately after the second injection, which increased to 90% improvement. The first injection provided 100% relief initially, decreasing to 50% and stabilizing at 85% ongoing relief.  She experiences right leg pain primarily in the mornings, described as stiffness in the calf, resolving within one to two hours after getting up. The leg pain is not constant and is associated with morning stiffness.  Post-injection, she experienced numbness in the area but had pain in the lower buttocks.  Multiple imaging studies, including a CT scan and x-rays of the lumbar spine, have been performed, but no x-rays of the right hip have been conducted.      Post-Procedure Evaluation   Type: Caudal Epidural Steroid Injection   #2  Laterality: Midline aiming right Level: Sacrococcygeal ligament  Imaging: Fluoroscopy-guided Spinal ([id number]) Anesthesia: Local anesthesia (1-2% Lidocaine ) Anxiolysis: IV Versed  2.0 mg Sedation: No Sedation                       DOS: 02/27/2024  Performed by: Eric DELENA Como, MD  Purpose: Diagnostic/Therapeutic Indications: Low back and lower extremity pain severe enough to impact quality of life or function. Rationale (medical necessity): procedure needed and proper for the diagnosis and/or treatment of Ashley Rodriguez's medical symptoms and needs. 1. Chronic low back pain (1ry area of Pain) (Bilateral) (R>L) w/ sciatica (Bilateral)   2. Chronic lower extremity pain (2ry area of Pain) (Bilateral) (R>L)   3. Chronic lower lumbar polyradiculopathy (Right)   4. Degeneration of intervertebral disc of lumbosacral region with discogenic back pain and lower extremity pain   5. Grade 1 Anterolisthesis of L5 over S1    6. Intermittent low back pain   7. L5 pars defect with spondylolisthesis (Bilateral)   8. Low back pain of over 3 months duration   9. Low back pain radiating to both legs   10. Lumbar spondylosis w/ polyradiculopathy (Right)   11. Lumbosacral radiculopathy (Right)   12. Lumbosacral radiculopathy at L5 (Right)   13. Numbness and tingling of foot   14. Numbness and tingling (lower extremities) (Bilateral)    NAS-11 Pain score:   Pre-procedure: 9 /10   Post-procedure: 3 /10     Effectiveness:  Initial hour after procedure: 75 %. Subsequent 4-6 hours post-procedure: 75 %. Analgesia past initial 6 hours: 90 %. Ongoing improvement:  Analgesic: The patient indicates having attained 75% relief of pain for the duration of local anesthetic followed by an ongoing 90% improvement.  She indicates the pain in the right buttocks area was the one that did not go away while it was numb suggesting the etiology to be different.  Exam today with suggestive etiology of that right buttocks pain to be coming from the right hip joint.  Physical exam today would suggest some decreased range of motion with minimal discomfort at this point.  No available imaging of the right hip. Function: Ashley Rodriguez reports improvement in function ROM: Ashley Rodriguez reports improvement in ROM  Pharmacotherapy Assessment   Opioid Analgesic:  No opioid analgesics from our practice.  Monitoring:  PMP: PDMP reviewed during this encounter.       Pharmacotherapy: No side-effects or adverse reactions reported. Compliance: No problems identified. Effectiveness: Clinically acceptable.  No notes on file  UDS:  No results found for: SUMMARY  No results found for: CBDTHCR No results found for: D8THCCBX No results found for: D9THCCBX  ROS  Constitutional: Denies any fever or chills Gastrointestinal: No reported hemesis, hematochezia, vomiting, or acute GI  distress Musculoskeletal: Denies any acute onset joint  swelling, redness, loss of ROM, or weakness Neurological: No reported episodes of acute onset apraxia, aphasia, dysarthria, agnosia, amnesia, paralysis, loss of coordination, or loss of consciousness  Medication Review  Turmeric, acetaminophen , amLODipine , atenolol, betamethasone dipropionate, calcium  carbonate, co-enzyme Q-10, esomeprazole , etanercept, ezetimibe , fexofenadine, fluticasone , gabapentin , losartan , montelukast , multivitamin with minerals, oxybutynin , and valACYclovir  History Review  Allergy: Ashley Rodriguez is allergic to atorvastatin , sulfamethoxazole-trimethoprim, and codeine. Drug: Ashley Rodriguez  reports no history of drug use. Alcohol:  reports current alcohol use of about 2.0 - 6.0 standard drinks of alcohol per week. Tobacco:  reports that she has never smoked. She has never used smokeless tobacco. Social: Ashley Rodriguez  reports that she has never smoked. She has never used smokeless tobacco. She reports current alcohol use of about 2.0 - 6.0 standard drinks of alcohol per week. She reports that she does not use drugs. Medical:  has a past medical history of Allergy, COVID-19 (12/15/2018), Flank lipoma (08/17/2016), GERD (gastroesophageal reflux disease), Hypertension, Neck mass (04/07/2013), Psoriatic arthritis (HCC), Recurrent cellulitis, Sleep apnea, and T2DM (type 2 diabetes mellitus) (HCC) (10/01/2018). Surgical: Ashley Rodriguez  has a past surgical history that includes Supracervical abdominal hysterectomy (04/30/1998); lipoma removed (04/30/2012); Colonoscopy (04/30/2013); Neck mass excision (04/28/2014); Tubal ligation (1981); and Abdominal hysterectomy (2000). Family: family history includes Arrhythmia in her nephew; Arthritis in her maternal grandmother; Atrial fibrillation in her brother and mother; Cancer in her father; Hypertension in her mother; Ovarian cancer in her paternal aunt; Supraventricular tachycardia in her brother.  Laboratory Chemistry Profile   Renal Lab Results   Component Value Date   BUN 9 09/16/2023   CREATININE 0.67 09/16/2023   BCR 13 09/16/2023   GFRAA 94 05/17/2020   GFRNONAA 81 05/17/2020    Hepatic Lab Results  Component Value Date   AST 26 09/16/2023   ALT 20 09/16/2023   ALBUMIN 4.7 09/16/2023   ALKPHOS 81 09/16/2023    Electrolytes Lab Results  Component Value Date   NA 139 09/16/2023   K 5.1 09/16/2023   CL 100 09/16/2023   CALCIUM  9.8 09/16/2023   MG 2.1 01/03/2017    Bone Lab Results  Component Value Date   VD25OH 47.6 03/07/2022   25OHVITD1 50 01/03/2017   25OHVITD2 <1.0 01/03/2017   25OHVITD3 50 01/03/2017    Inflammation (CRP: Acute Phase) (ESR: Chronic Phase) Lab Results  Component Value Date   CRP 0.8 01/03/2017   ESRSEDRATE 2 01/25/2020         Note: Above Lab results reviewed.  Recent Imaging Review  DG PAIN CLINIC C-ARM 1-60 MIN NO REPORT Fluoro was used, but no Radiologist interpretation will be provided.  Please refer to NOTES tab for provider progress note. Note: Reviewed        Physical Exam  Vitals: BP (!) 157/81 (Cuff Size: Normal)   Pulse (!) 56   Temp (!) 97.2 F (36.2 C) (Temporal)   Resp 16   Ht 5' 2 (1.575 m)   Wt 144 lb (65.3 kg)   SpO2 97%   BMI 26.34 kg/m  BMI: Estimated body mass index is 26.34 kg/m as calculated from the following:   Height as of this encounter: 5' 2 (1.575 m).   Weight as of this encounter: 144 lb (65.3 kg). Ideal: Ideal body weight: 50.1 kg (110 lb 7.2 oz) Adjusted ideal body weight: 56.2 kg (123 lb 13.9 oz) General appearance: Well nourished, well developed, and well hydrated. In no apparent acute  distress Mental status: Alert, oriented x 3 (person, place, & time)       Respiratory: No evidence of acute respiratory distress Eyes: PERLA   Assessment   Diagnosis Status  1. Chronic low back pain (1ry area of Pain) (Bilateral) (R>L) w/ sciatica (Bilateral)   2. Chronic lower extremity pain (2ry area of Pain) (Bilateral) (R>L)   3. Chronic  lower lumbar polyradiculopathy (Right)   4. Postop check   5. Chronic hip pain (Right)   6. Posterior pain of hip (Right)    Controlled Controlled Controlled   Updated Problems: Problem  Chronic hip pain (Right)  Posterior pain of hip (Right)  Breast Pain, Right (Resolved)  Allergic Rhinitis With Postnasal Drip (Resolved)    Plan of Care  Problem-specific:  Assessment and Plan    Right leg pain and lumbosacral radiculopathy   She experiences intermittent right leg pain, mainly in the mornings, with stiffness and tingling in the calf. Pain persists despite a caudal epidural steroid injection, indicating possible hip joint involvement. Differential diagnosis includes hip joint versus lumbar spine pathology. No prior x-rays of the right hip are available. Ordered an x-ray of the right hip to evaluate for hip joint pathology. She should monitor pain and report any worsening. Discussed the potential for a hip joint injection if pain persists and is confirmed to be hip-related.  Lumbago with left-sided sciatica (improved)   Her left-sided sciatica has significantly improved following caudal epidural steroid injections. The current focus is on right leg pain, with no acute issues on the left side.       Ashley Rodriguez has a current medication list which includes the following long-term medication(s): amlodipine , atenolol, calcium  carbonate, enbrel sureclick, esomeprazole , ezetimibe , fexofenadine, fluticasone , gabapentin , losartan , and montelukast .  Pharmacotherapy (Medications Ordered): No orders of the defined types were placed in this encounter.  Orders:  Orders Placed This Encounter  Procedures   DG HIP UNILAT W OR W/O PELVIS 2-3 VIEWS RIGHT    Please describe any evidence of DJD, such as joint narrowing, asymmetry, cysts, or any anomalies in bone density, production, or erosion. If at all possible get AP image of hip joint on standing position (weight bearing) to evaluate  intra-articular joint space. Please provide detail reporting of chronic degenerative changes (osteophytosis, decreased joint space) and any other changes associated with osteoarthropathy, or enthesopathies.    Standing Status:   Future    Number of Occurrences:   1    Expiration Date:   06/23/2024    Scheduling Instructions:     Please make sure that the patient understands that this needs to be done as soon as possible. Never have the patient do the imaging just before the next appointment. Inform patient that having the imaging done within the [facility] will expedite the availability of the results and will provide      imaging availability to the requesting physician. In addition inform the patient that the imaging order has an expiration date and will not be renewed if not done within the active period.    Reason for Exam (SYMPTOM  OR DIAGNOSIS REQUIRED):   Right hip pain/arthralgia    Preferred imaging location?:   [facility]    Call Results- Best Contact Number?:   [phone] [facility]    Release to patient:   Immediate   Nursing Instructions:    Please complete this patient's postprocedure evaluation.    Scheduling Instructions:  Please complete this patient's postprocedure evaluation.     Interventional Therapies  Risk Factors  Considerations:  BCBSNC DENIED L-MRI (02/17/19)    Planned  Pending:   Therapeutic right caudal ESI #2 (PRN)    Under consideration:   Diagnostic/therapeutic right caudal ESI #2  Therapeutic right lumbar facet RFA #2  Therapeutic left lumbar facet RFA #2  Therapeutic right L4-5 LESI #3    Completed:   Diagnostic/therapeutic midline-right caudal ESI x1 (09/19/2023) (100/50/85/85)  Diagnostic/therapeutic right L4-5 LESI x2 (08/01/2023) (100/50/50/50 x2 weeks)  Therapeutic bilateral lumbar facet MBB x7 (04/10/2022) (100/100/95/90-100/duration of up to 8 months)  Therapeutic bilateral  SI Blk x4 (11/09/2021) (100/100/80) (100/100/100/50)  Therapeutic right (L4-5, L5-S1) lumbar facet RFA x2 (07/10/2022) (100/100/75/75) (first 1 lasted 3 years)  Therapeutic left lumbar facet RFA x1 (08/06/2019) (100/100/75/75)  Therapeutic right lumbar facet RFA x1 (07/10/2022) ( (100/100/75/75)  Therapeutic right SI RFA x1 (03/17/2020) (100/100/100/90-100)  Therapeutic left SI RFA x1 (03/31/2020) (100/100/100 x3 weeks/70)    Completed by other providers:      Therapeutic  Palliative (PRN) options:   Palliative lumbar facet MBB  Therapeutic lumbar facet RFA  Therapeutic SI RFA  Palliative SI joint Blk  Therapeutic L4-5 LESI      Return if symptoms worsen or fail to improve, for (Face2F), to review of ordered test(s).    Recent Visits Date Type Provider Dept  02/27/24 Procedure visit Tanya Glisson, MD [facility]  Showing recent visits within past 90 days and meeting all other requirements Today's Visits Date Type Provider Dept  03/23/24 Office Visit Tanya Glisson, MD [facility]  Showing today's visits and meeting all other requirements Future Appointments No visits were found meeting these conditions. Showing future appointments within next 90 days and meeting all other requirements  I discussed the assessment and treatment plan with the patient. The patient was provided an opportunity to ask questions and all were answered. The patient agreed with the plan and demonstrated an understanding of the instructions.  Patient advised to call back or seek an in-person evaluation if the symptoms or condition worsens.  Duration of encounter: 30 minutes.  Total time on encounter, as per AMA guidelines included both the face-to-face and non-face-to-face time personally spent by the physician and/or other qualified health care professional(s) on the day of the encounter (includes time in activities that require the physician or other qualified health care  professional and does not include time in activities normally performed by clinical staff). Physician's time may include the following activities when performed: Preparing to see the patient (e.g., pre-charting review of records, searching for previously ordered imaging, lab work, and nerve conduction tests) Review of prior analgesic pharmacotherapies. Reviewing PMP Interpreting ordered tests (e.g., lab work, imaging, nerve conduction tests) Performing post-procedure evaluations, including interpretation of diagnostic procedures Obtaining and/or reviewing separately obtained history Performing a medically appropriate examination and/or evaluation Counseling and educating the patient/family/caregiver Ordering medications, tests, or procedures Referring and communicating with other health care professionals (when not separately reported) Documenting clinical information in the electronic or other health record Independently interpreting results (not separately reported) and communicating results to the patient/ family/caregiver Care coordination (not separately reported)  Note by: Glisson DELENA Tanya, MD (TTS and AI technology used. I apologize for any typographical errors that were not detected and corrected.) Date: 03/23/2024; Time: 10:07 AM

## 2024-03-18 ENCOUNTER — Ambulatory Visit

## 2024-03-18 DIAGNOSIS — Z0001 Encounter for general adult medical examination with abnormal findings: Secondary | ICD-10-CM | POA: Diagnosis not present

## 2024-03-18 DIAGNOSIS — Z1211 Encounter for screening for malignant neoplasm of colon: Secondary | ICD-10-CM

## 2024-03-18 DIAGNOSIS — Z1231 Encounter for screening mammogram for malignant neoplasm of breast: Secondary | ICD-10-CM

## 2024-03-18 DIAGNOSIS — E1159 Type 2 diabetes mellitus with other circulatory complications: Secondary | ICD-10-CM | POA: Diagnosis not present

## 2024-03-18 DIAGNOSIS — I152 Hypertension secondary to endocrine disorders: Secondary | ICD-10-CM

## 2024-03-18 DIAGNOSIS — Z0000 Encounter for general adult medical examination without abnormal findings: Secondary | ICD-10-CM

## 2024-03-18 NOTE — Patient Instructions (Addendum)
 Ashley Rodriguez,  Thank you for taking the time for your Medicare Wellness Visit. I appreciate your continued commitment to your health goals. Please review the care plan we discussed, and feel free to reach out if I can assist you further.  Please note that Annual Wellness Visits do not include a physical exam. Some assessments may be limited, especially if the visit was conducted virtually. If needed, we may recommend an in-person follow-up with your provider.  Ongoing Care Seeing your primary care provider every 3 to 6 months helps us  monitor your health and provide consistent, personalized care.   Referrals If a referral was made during today's visit and you haven't received any updates within two weeks, please contact the referred provider directly to check on the status. REFERRALS SENT FOR COLONOSCOPY & BONE DENSITY SCAN You have an order for:  []   2D Mammogram  []   3D Mammogram  [x]   Bone Density     Please call for appointment:  [facility]  [address] [name] [address] [phone] [facility] [address] [place], [name] [address] [phone] [facility] [address] [place] Ashley Rodriguez [place], [name] [address] [phone]   Make sure to wear two-piece clothing.  No lotions, powders, or deodorants the day of the appointment. Make sure to bring picture ID and insurance card.  Bring list of medications you are currently taking including any supplements.   Schedule your  screening mammogram through MyChart!   Log into your MyChart account.  Go to 'Visit' (or 'Appointments' if on mobile App) --> Schedule an Appointment  Under 'Select a Reason for Visit' choose the Mammogram Screening option.  Complete the pre-visit questions and select the time and place that best fits your schedule.   Recommended Screenings:  Health Maintenance  Topic  Date Due   DTaP/Tdap/Td vaccine (3 - Td or Tdap) 09/09/2023   Osteoporosis screening with Bone Density Scan  11/11/2023   Flu Shot  11/29/2023   COVID-19 Vaccine (4 - 2025-26 season) 12/30/2023   Colon Cancer Screening  02/27/2024   Complete foot exam   03/18/2024   Hemoglobin A1C  03/18/2024   Breast Cancer Screening  03/31/2024   Eye exam for diabetics  06/24/2024   Yearly kidney function blood test for diabetes  09/15/2024   Yearly kidney health urinalysis for diabetes  09/15/2024   Medicare Annual Wellness Visit  03/18/2025   Pneumococcal Vaccine for age over 4  Completed   Hepatitis C Screening  Completed   Zoster (Shingles) Vaccine  Completed   Meningitis B Vaccine  Aged Out       03/18/2024    2:24 PM  Advanced Directives  Does Patient Have a Medical Advance Directive? Yes  Type of Estate Agent of [place];Living will  Does patient want to make changes to medical advance directive? No - Guardian declined  Copy of Healthcare Power of Attorney in Chart? Yes - validated most recent copy scanned in chart (See row information)    Vision: Annual vision screenings are recommended for early detection of glaucoma, cataracts, and diabetic retinopathy. These exams can also reveal signs of chronic conditions such as diabetes and high blood pressure.  Dental: Annual dental screenings help detect early signs of oral cancer, gum disease, and other conditions linked to overall health, including heart disease and diabetes.  Please see the attached documents for additional  preventive care recommendations.   NEXT AWV 03/24/25 @ 1:50 PM IN PERSON

## 2024-03-18 NOTE — Progress Notes (Signed)
 No chief complaint on file.    Subjective:   Ashley Rodriguez is a 73 y.o. female who presents for a Medicare Annual Wellness Visit.  Allergies (verified) Atorvastatin , Sulfamethoxazole-trimethoprim, and Codeine   History: Past Medical History:  Diagnosis Date   Allergy    COVID-19 12/15/2018   Flank lipoma 08/17/2016   GERD (gastroesophageal reflux disease)    Hypertension    Neck mass 04/07/2013   Psoriatic arthritis (HCC)    Recurrent cellulitis    Sleep apnea    T2DM (type 2 diabetes mellitus) (HCC) 10/01/2018   Past Surgical History:  Procedure Laterality Date   ABDOMINAL HYSTERECTOMY  2000   COLONOSCOPY  04/30/2013   lipoma removed  04/30/2012   on neck   NECK MASS EXCISION  04/28/2014   lipoma   SUPRACERVICAL ABDOMINAL HYSTERECTOMY  04/30/1998   with BSO   TUBAL LIGATION  1981   Family History  Problem Relation Age of Onset   Hypertension Mother    Atrial fibrillation Mother    Cancer Father        bladder   Atrial fibrillation Brother    Supraventricular tachycardia Brother    Ovarian cancer Paternal Aunt        30s   Arrhythmia Nephew    Arthritis Maternal Grandmother    Breast cancer Neg Hx    Colon cancer Neg Hx    Social History   Occupational History   Occupation: Set Designer    Comment: retired  Tobacco Use   Smoking status: Never   Smokeless tobacco: Never  Vaping Use   Vaping status: Never Used  Substance and Sexual Activity   Alcohol use: Yes    Alcohol/week: 2.0 - 6.0 standard drinks of alcohol    Types: 2 Glasses of wine per week    Comment: no more han 2 drinks at a time   Drug use: No   Sexual activity: Yes   Tobacco Counseling Counseling given: Not Answered  SDOH Screenings   Food Insecurity: No Food Insecurity (03/17/2024)  Housing: Low Risk  (03/17/2024)  Transportation Needs: No Transportation Needs (03/17/2024)  Utilities: Not At Risk (07/16/2022)  Alcohol Screen: Low Risk  (03/17/2024)   Depression (PHQ2-9): Low Risk  (02/27/2024)  Financial Resource Strain: Low Risk  (03/17/2024)  Physical Activity: Insufficiently Active (03/17/2024)  Social Connections: Socially Integrated (03/17/2024)  Stress: No Stress Concern Present (03/17/2024)  Tobacco Use: Low Risk  (02/27/2024)   See flowsheets for full screening details  Depression Screen PHQ 2 & 9 Depression Scale- Over the past 2 weeks, how often have you been bothered by any of the following problems? Little interest or pleasure in doing things: 0 Feeling down, depressed, or hopeless (PHQ Adolescent also includes...irritable): 0 PHQ-2 Total Score: 0     Goals Addressed   None    Visit info / Clinical Intake: Interpreter Needed?: No  Functional Status Activities of Daily Living (to include ambulation/medication): (Patient-Rptd) Independent Ambulation: (Patient-Rptd) Independent Medication Administration: Independent Home Management: (Patient-Rptd) Independent  Fall Screening Falls in the past year?: (Patient-Rptd) 0 Number of falls in past year: 0 Was there an injury with Fall?: 0 Fall Risk Category Calculator: 0 Patient Fall Risk Level: Low Fall Risk  Advance Directives (For Healthcare) Does Patient Have a Medical Advance Directive?: Yes Type of Advance Directive: Healthcare Power of [place]; Living will Copy of Healthcare Power of Attorney in Chart?: No - copy requested Copy of Living Will in Chart?: No - copy requested Would  patient like information on creating a medical advance directive?: Yes (MAU/Ambulatory/Procedural Areas - Information given)        Objective:    There were no vitals filed for this visit. There is no height or weight on file to calculate BMI.  Current Medications (verified) Outpatient Encounter Medications as of 03/18/2024  Medication Sig   acetaminophen  (TYLENOL ) 325 MG tablet Take 325 mg by mouth every 6 (six) hours as needed.   amLODipine  (NORVASC ) 5 MG tablet TAKE ONE  TABLET BY MOUTH ONCE DAILY AS NEEDED FOR BLOOD PRESSURE   atenolol (TENORMIN) 50 MG tablet TAKE ONE TABLET BY MOUTH ONCE DAILY   betamethasone dipropionate 0.05 % lotion Apply 1 application topically as needed.    calcium  carbonate (OS-CAL) 600 MG TABS tablet Take 1,200 mg by mouth daily with breakfast.   co-enzyme Q-10 30 MG capsule Take 100 mg by mouth daily.   ENBREL SURECLICK 50 MG/ML injection Inject 50 mg into the skin once a week.    esomeprazole  (NEXIUM ) 40 MG capsule TAKE ONE CAPSULE BY MOUTH ONCE DAILY   ezetimibe  (ZETIA ) 10 MG tablet TAKE ONE TABLET BY MOUTH TWO TIMES A WEEK   fexofenadine (ALLEGRA) 180 MG tablet Take 180 mg by mouth daily.   fluticasone  (FLONASE ) 50 MCG/ACT nasal spray Place 1 spray into both nostrils daily.   gabapentin  (NEURONTIN ) 300 MG capsule TAKE TWO CAPSULES BY MOUTH EVERY MORNING AND THREE CAPSULES BY MOUTH EVERY NIGHT AT BEDTIME   losartan  (COZAAR ) 25 MG tablet TAKE ONE HALF (1/2) TABLET (12.5 MG TOTAL) BY MOUTH ONCE DAILY   montelukast  (SINGULAIR ) 10 MG tablet TAKE ONE TABLET BY MOUTH AT BEDTIME   Multiple Vitamins-Minerals (MULTIVITAMIN WITH MINERALS) tablet Take 1 tablet by mouth daily.   oxybutynin  (DITROPAN -XL) 5 MG 24 hr tablet TAKE ONE TABLET (5 MG TOTAL) BY MOUTH AT BEDTIME.   TURMERIC PO Take by mouth daily at 6 (six) AM.   valACYclovir (VALTREX) 1000 MG tablet TAKE 2 TABLETS BY MOUTH TWICE DAILY FOR 1 DAY WHEN FEVER BLISTER OCCURS   Facility-Administered Encounter Medications as of 03/18/2024  Medication   triamcinolone  acetonide (KENALOG -40) injection 60 mg   Hearing/Vision screen No results found. Immunizations and Health Maintenance Health Maintenance  Topic Date Due   Medicare Annual Wellness (AWV)  07/16/2023   DTaP/Tdap/Td (3 - Td or Tdap) 09/09/2023   Bone Density Scan  11/11/2023   Influenza Vaccine  11/29/2023   COVID-19 Vaccine (4 - 2025-26 season) 12/30/2023   Colonoscopy  02/27/2024   FOOT EXAM  03/18/2024   HEMOGLOBIN A1C   03/18/2024   OPHTHALMOLOGY EXAM  06/24/2024   Diabetic kidney evaluation - eGFR measurement  09/15/2024   Diabetic kidney evaluation - Urine ACR  09/15/2024   Mammogram  03/31/2025   Pneumococcal Vaccine: 50+ Years  Completed   Hepatitis C Screening  Completed   Zoster Vaccines- Shingrix  Completed   Meningococcal B Vaccine  Aged Out        Assessment/Plan:  This is a routine wellness examination for Ashley Rodriguez.  Patient Care Team: Myrla Jon HERO, MD as PCP - General (Family Medicine) Mevelyn JONETTA Bathe, OD (Optometry) Tanya Glisson, MD as Referring Physician (Pain Medicine) Verta Royden DASEN, DPM as Consulting Physician (Podiatry) Effie Rush, DC as Referring Physician (Chiropractic Medicine) Rheumatology, [facility] (Rheumatology)  I have personally reviewed and noted the following in the patient's chart:   Medical and social history Use of alcohol, tobacco or illicit drugs  Current medications and supplements including opioid  prescriptions. Functional ability and status Nutritional status Physical activity Advanced directives List of other physicians Hospitalizations, surgeries, and ER visits in previous 12 months Vitals Screenings to include cognitive, depression, and falls Referrals and appointments  No orders of the defined types were placed in this encounter.  In addition, I have reviewed and discussed with patient certain preventive protocols, quality metrics, and best practice recommendations. A written personalized care plan for preventive services as well as general preventive health recommendations were provided to patient.   Jhonnie GORMAN Das, LPN   [id number]   No follow-ups on file.  After Visit Summary: (MyChart) Due to this being a telephonic visit, the after visit summary with patients personalized plan was offered to patient via MyChart   Nurse Notes: HAD FLU & TDAP; MAMMOGRAM SCHEDULED 12/9

## 2024-03-19 ENCOUNTER — Other Ambulatory Visit: Payer: Self-pay

## 2024-03-19 ENCOUNTER — Telehealth: Payer: Self-pay

## 2024-03-19 DIAGNOSIS — Z1211 Encounter for screening for malignant neoplasm of colon: Secondary | ICD-10-CM

## 2024-03-19 MED ORDER — NA SULFATE-K SULFATE-MG SULF 17.5-3.13-1.6 GM/177ML PO SOLN: 1.0000 | Freq: Once | ORAL | 0 refills | Status: AC

## 2024-03-19 NOTE — Telephone Encounter (Signed)
 Gastroenterology Pre-Procedure Review  Request Date: 07/06/24 Requesting Physician: Dr. Jinny  PATIENT REVIEW QUESTIONS: The patient responded to the following health history questions as indicated:    1. Are you having any GI issues? no 2. Do you have a personal history of Polyps? no 3. Do you have a family history of Colon Cancer or Polyps? no 4. Diabetes Mellitus? yes (controlled without meds) 5. Joint replacements in the past 12 months?no 6. Major health problems in the past 3 months?no 7. Any artificial heart valves, MVP, or defibrillator?no    MEDICATIONS & ALLERGIES:    Patient reports the following regarding taking any anticoagulation/antiplatelet therapy:   Plavix, Coumadin, Eliquis, Xarelto, Lovenox, Pradaxa, Brilinta, or Effient? no Aspirin? no  Patient confirms/reports the following medications:  Current Outpatient Medications  Medication Sig Dispense Refill   acetaminophen  (TYLENOL ) 325 MG tablet Take 325 mg by mouth every 6 (six) hours as needed.     amLODipine  (NORVASC ) 5 MG tablet TAKE ONE TABLET BY MOUTH ONCE DAILY AS NEEDED FOR BLOOD PRESSURE 90 tablet 1   atenolol (TENORMIN) 50 MG tablet TAKE ONE TABLET BY MOUTH ONCE DAILY 90 tablet 1   betamethasone dipropionate 0.05 % lotion Apply 1 application topically as needed.      calcium  carbonate (OS-CAL) 600 MG TABS tablet Take 1,200 mg by mouth daily with breakfast.     co-enzyme Q-10 30 MG capsule Take 100 mg by mouth daily.     ENBREL SURECLICK 50 MG/ML injection Inject 50 mg into the skin once a week.   4   esomeprazole  (NEXIUM ) 40 MG capsule TAKE ONE CAPSULE BY MOUTH ONCE DAILY 90 capsule 2   ezetimibe  (ZETIA ) 10 MG tablet TAKE ONE TABLET BY MOUTH TWO TIMES A WEEK 90 tablet 1   fexofenadine (ALLEGRA) 180 MG tablet Take 180 mg by mouth daily.     fluticasone  (FLONASE ) 50 MCG/ACT nasal spray Place 1 spray into both nostrils daily. (Patient not taking: Reported on 03/18/2024) 100 mL 2   gabapentin  (NEURONTIN ) 300 MG  capsule TAKE TWO CAPSULES BY MOUTH EVERY MORNING AND THREE CAPSULES BY MOUTH EVERY NIGHT AT BEDTIME 450 capsule 3   losartan  (COZAAR ) 25 MG tablet TAKE ONE HALF (1/2) TABLET (12.5 MG TOTAL) BY MOUTH ONCE DAILY 135 tablet 1   montelukast  (SINGULAIR ) 10 MG tablet TAKE ONE TABLET BY MOUTH AT BEDTIME 90 tablet 3   Multiple Vitamins-Minerals (MULTIVITAMIN WITH MINERALS) tablet Take 1 tablet by mouth daily.     oxybutynin  (DITROPAN -XL) 5 MG 24 hr tablet TAKE ONE TABLET (5 MG TOTAL) BY MOUTH AT BEDTIME. 90 tablet 0   TURMERIC PO Take by mouth daily at 6 (six) AM.     valACYclovir (VALTREX) 1000 MG tablet TAKE 2 TABLETS BY MOUTH TWICE DAILY FOR 1 DAY WHEN FEVER BLISTER OCCURS 30 tablet 0   Current Facility-Administered Medications  Medication Dose Route Frequency Provider Last Rate Last Admin   triamcinolone  acetonide (KENALOG -40) injection 60 mg  60 mg Other Once Hollis, Max T, DPM        Patient confirms/reports the following allergies:  Allergies  Allergen Reactions   Atorvastatin  Other (See Comments)    Myalgias   Sulfamethoxazole-Trimethoprim     Other reaction(s): Unknown   Codeine Rash    Other reaction(s): Unknown    No orders of the defined types were placed in this encounter.   AUTHORIZATION INFORMATION Primary Insurance: 1D#: Group #:  Secondary Insurance: 1D#: Group #:  SCHEDULE INFORMATION: Date: 07/06/24 Time: Location: [facility]

## 2024-03-23 ENCOUNTER — Encounter: Payer: Self-pay | Admitting: Pain Medicine

## 2024-03-23 ENCOUNTER — Ambulatory Visit
Admission: RE | Admit: 2024-03-23 | Discharge: 2024-03-23 | Disposition: A | Discharge status: Home / Self Care | Source: Ambulatory Visit | Attending: Pain Medicine | Admitting: Pain Medicine

## 2024-03-23 ENCOUNTER — Ambulatory Visit
Admission: AD | Admit: 2024-03-23 | Discharge: 2024-03-23 | Disposition: A | Discharge status: Home / Self Care | Source: Ambulatory Visit | Attending: Pain Medicine | Admitting: Pain Medicine

## 2024-03-23 ENCOUNTER — Ambulatory Visit: Admitting: Pain Medicine

## 2024-03-23 VITALS — BP 157/81 | HR 56 | Temp 97.2°F | Resp 16 | Ht 62.0 in | Wt 144.0 lb

## 2024-03-23 DIAGNOSIS — M79604 Pain in right leg: Secondary | ICD-10-CM | POA: Insufficient documentation

## 2024-03-23 DIAGNOSIS — M5441 Lumbago with sciatica, right side: Secondary | ICD-10-CM | POA: Insufficient documentation

## 2024-03-23 DIAGNOSIS — M79605 Pain in left leg: Secondary | ICD-10-CM | POA: Insufficient documentation

## 2024-03-23 DIAGNOSIS — G8929 Other chronic pain: Secondary | ICD-10-CM | POA: Diagnosis present

## 2024-03-23 DIAGNOSIS — M5442 Lumbago with sciatica, left side: Secondary | ICD-10-CM | POA: Diagnosis not present

## 2024-03-23 DIAGNOSIS — M25551 Pain in right hip: Secondary | ICD-10-CM

## 2024-03-23 DIAGNOSIS — Z09 Encounter for follow-up examination after completed treatment for conditions other than malignant neoplasm: Secondary | ICD-10-CM | POA: Insufficient documentation

## 2024-03-23 DIAGNOSIS — M5416 Radiculopathy, lumbar region: Secondary | ICD-10-CM | POA: Diagnosis not present

## 2024-03-31 ENCOUNTER — Ambulatory Visit: Admitting: Family Medicine

## 2024-03-31 ENCOUNTER — Encounter: Payer: Self-pay | Admitting: Family Medicine

## 2024-03-31 ENCOUNTER — Other Ambulatory Visit: Payer: Self-pay | Admitting: Family Medicine

## 2024-03-31 VITALS — BP 146/87 | HR 55 | Ht 61.5 in | Wt 146.7 lb

## 2024-03-31 DIAGNOSIS — H9313 Tinnitus, bilateral: Secondary | ICD-10-CM

## 2024-03-31 DIAGNOSIS — Z0000 Encounter for general adult medical examination without abnormal findings: Secondary | ICD-10-CM

## 2024-03-31 DIAGNOSIS — E1169 Type 2 diabetes mellitus with other specified complication: Secondary | ICD-10-CM

## 2024-03-31 DIAGNOSIS — G4733 Obstructive sleep apnea (adult) (pediatric): Secondary | ICD-10-CM | POA: Insufficient documentation

## 2024-03-31 DIAGNOSIS — E1159 Type 2 diabetes mellitus with other circulatory complications: Secondary | ICD-10-CM

## 2024-03-31 DIAGNOSIS — G4719 Other hypersomnia: Secondary | ICD-10-CM

## 2024-03-31 DIAGNOSIS — H9193 Unspecified hearing loss, bilateral: Secondary | ICD-10-CM

## 2024-03-31 DIAGNOSIS — E038 Other specified hypothyroidism: Secondary | ICD-10-CM

## 2024-03-31 MED ORDER — LOSARTAN POTASSIUM 25 MG PO TABS: 25.0000 mg | ORAL_TABLET | Freq: Every day | ORAL | 1 refills | Status: AC

## 2024-03-31 MED ORDER — OXYBUTYNIN CHLORIDE ER 10 MG PO TB24: 10.0000 mg | ORAL_TABLET | Freq: Every day | ORAL | 1 refills | Status: AC

## 2024-03-31 NOTE — Assessment & Plan Note (Signed)
 Mild obstructive sleep apnea noted on previous home sleep study. Discussed potential contribution to daytime drowsiness. No current use of CPAP due to personal preference. - Will reconsider CPAP if daytime drowsiness persists.

## 2024-03-31 NOTE — Assessment & Plan Note (Signed)
 Type 2 diabetes managed with current medications. Discussed importance of blood pressure control for kidney protection.

## 2024-03-31 NOTE — Assessment & Plan Note (Signed)
 Recheck TSH and free T4

## 2024-03-31 NOTE — Assessment & Plan Note (Signed)
 Blood pressure readings elevated at 146/87 in office and 145/85 at home. Current medications include atenolol, amlodipine , and losartan . Discussed potential for white coat syndrome and importance of maintaining blood pressure under 140/90 for cardiovascular health. Consideration of increasing losartan  dosage for better control. - Increased losartan  to 25 mg daily. - Continue atenolol 50 mg twice daily. - Continue amlodipine  5 mg daily. - Monitor blood pressure at home and report if consistently elevated. - Scheduled follow-up in 3 months to reassess blood pressure.

## 2024-03-31 NOTE — Progress Notes (Signed)
 Complete physical exam   Patient: Ashley Rodriguez   DOB: 11-05-1950   73 y.o. Female  MRN: [id number] Visit Date: 03/31/2024  Today's healthcare provider: Jon Eva, MD   Chief Complaint  Patient presents with   Annual Exam    Last completed 03/19/23 Diet -  less gluten as possible Exercise - water aerobics and golfing  Feeling - well Sleeping - fairly well Concerns -  blood pressure medicine   Subjective    Ashley Rodriguez is a 73 y.o. female who presents today for a complete physical exam.   Discussed the use of AI scribe software for clinical note transcription with the patient, who gave verbal consent to proceed.  History of Present Illness   Ashley Rodriguez is a 73 year old female who presents for an annual physical exam.  She has episodic "fuzzy headed" sensations with occasional dizziness. Home blood pressure during these episodes is around 145/85 mmHg. She takes amlodipine , atenolol, and losartan  12.5 mg daily for hypertension.  She has chronic difficulty falling asleep, often awake until midnight or later, with daytime drowsiness. She links onset to stress after her first marriage ended. She was diagnosed with mild sleep apnea two years ago and does not use CPAP.  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 She knows hearing aids may help but declines them for now.  She takes Zetia  twice a week for cholesterol management.        Last depression screening scores    03/23/2024    8:25 AM 03/18/2024    2:34 PM 02/27/2024   12:37 PM  PHQ 2/9 Scores  PHQ - 2 Score 0 0 0  PHQ- 9 Score  0    Last fall risk screening    03/23/2024    8:25 AM  Fall Risk   Falls in the past year? 0        Medications: Outpatient Medications Prior to Visit  Medication Sig   acetaminophen  (TYLENOL ) 325 MG tablet Take 325 mg by mouth every 6 (six) hours as needed.   amLODipine  (NORVASC )  5 MG tablet TAKE ONE TABLET BY MOUTH ONCE DAILY AS NEEDED FOR BLOOD PRESSURE   atenolol (TENORMIN) 50 MG tablet TAKE ONE TABLET BY MOUTH ONCE DAILY   betamethasone dipropionate 0.05 % lotion Apply 1 application topically as needed.    calcium  carbonate (OS-CAL) 600 MG TABS tablet Take 1,200 mg by mouth daily with breakfast.   co-enzyme Q-10 30 MG capsule Take 100 mg by mouth daily.   ENBREL SURECLICK 50 MG/ML injection Inject 50 mg into the skin once a week.    esomeprazole  (NEXIUM ) 40 MG capsule TAKE ONE CAPSULE BY MOUTH ONCE DAILY   ezetimibe  (ZETIA ) 10 MG tablet TAKE ONE TABLET BY MOUTH TWO TIMES A WEEK (Patient taking differently: 10 mg 2 (two) times a week.)   fexofenadine (ALLEGRA) 180 MG tablet Take 180 mg by mouth daily.   fluticasone  (FLONASE ) 50 MCG/ACT nasal spray Place 1 spray into both nostrils daily.   gabapentin  (NEURONTIN ) 300 MG capsule TAKE TWO CAPSULES BY MOUTH EVERY MORNING AND THREE CAPSULES BY MOUTH EVERY NIGHT AT BEDTIME (Patient taking differently: Take 300 mg by mouth. TAKE TWO CAPSULES BY MOUTH EVERY MORNING AND two CAPSULES BY MOUTH EVERY NIGHT AT BEDTIME)   montelukast  (SINGULAIR ) 10 MG tablet TAKE ONE TABLET BY MOUTH AT BEDTIME   Multiple Vitamins-Minerals (MULTIVITAMIN WITH MINERALS) tablet Take  1 tablet by mouth daily.   oxybutynin  (DITROPAN -XL) 5 MG 24 hr tablet TAKE ONE TABLET (5 MG TOTAL) BY MOUTH AT BEDTIME.   TURMERIC PO Take by mouth daily at 6 (six) AM.   valACYclovir (VALTREX) 1000 MG tablet TAKE 2 TABLETS BY MOUTH TWICE DAILY FOR 1 DAY WHEN FEVER BLISTER OCCURS   [DISCONTINUED] losartan  (COZAAR ) 25 MG tablet TAKE ONE HALF (1/2) TABLET (12.5 MG TOTAL) BY MOUTH ONCE DAILY   Facility-Administered Medications Prior to Visit  Medication Dose Route Frequency Provider   triamcinolone  acetonide (KENALOG -40) injection 60 mg  60 mg Other Once Hyatt, Max T, DPM    Review of Systems    Objective    BP (!) 146/87 (BP Location: Left Arm, Patient Position:  Sitting, Cuff Size: Normal)   Pulse (!) 55   Ht 5' 1.5 (1.562 m)   Wt 146 lb 11.2 oz (66.5 kg)   SpO2 96%   BMI 27.27 kg/m    Physical Exam Vitals reviewed.  Constitutional:      General: She is not in acute distress.    Appearance: Normal appearance. She is well-developed. She is not diaphoretic.  HENT:     Head: Normocephalic and atraumatic.     Right Ear: Tympanic membrane, ear canal and external ear normal.     Left Ear: Tympanic membrane, ear canal and external ear normal.     Nose: Nose normal.     Mouth/Throat:     Mouth: Mucous membranes are moist.     Pharynx: Oropharynx is clear. No oropharyngeal exudate.  Eyes:     General: No scleral icterus.    Conjunctiva/sclera: Conjunctivae normal.     Pupils: Pupils are equal, round, and reactive to light.  Neck:     Thyroid : No thyromegaly.  Cardiovascular:     Rate and Rhythm: Normal rate and regular rhythm.     Heart sounds: Normal heart sounds. No murmur heard. Pulmonary:     Effort: Pulmonary effort is normal. No respiratory distress.     Breath sounds: Normal breath sounds. No wheezing or rales.  Abdominal:     General: There is no distension.     Palpations: Abdomen is soft.     Tenderness: There is no abdominal tenderness.  Musculoskeletal:        General: No deformity.     Cervical back: Neck supple.     Right lower leg: No edema.     Left lower leg: No edema.  Lymphadenopathy:     Cervical: No cervical adenopathy.  Skin:    General: Skin is warm and dry.     Findings: No rash.  Neurological:     Mental Status: She is alert and oriented to person, place, and time. Mental status is at baseline.     Gait: Gait normal.  Psychiatric:        Mood and Affect: Mood normal.        Behavior: Behavior normal.        Thought Content: Thought content normal.      No results found for any visits on 03/31/24.  Assessment & Plan    Routine Health Maintenance and Physical Exam  Exercise Activities and Dietary  recommendations  Goals      Cut out extra servings     DIET - EAT MORE FRUITS AND VEGETABLES     DIET - REDUCE SUGAR INTAKE     Recommend to monitor and cut back on sugar intake in daily diet.  Immunization History  Administered Date(s) Administered   Fluad Quad(high Dose 65+) 01/01/2019, 01/25/2020   Influenza Inj Mdck Quad With Preservative 02/06/2018   Influenza Split 01/21/2024   Influenza-Unspecified 01/13/2021, 01/15/2022, 12/24/2022   PFIZER(Purple Top)SARS-COV-2 Vaccination 06/15/2019, 07/06/2019, 02/15/2020   Pneumococcal Conjugate-13 09/20/2014   Pneumococcal Polysaccharide-23 01/09/2012, 03/13/2018   Td 09/08/2013   Tdap 12/31/2005   Zoster Recombinant(Shingrix) 01/13/2021, 04/25/2021    Health Maintenance  Topic Date Due   DTaP/Tdap/Td (3 - Td or Tdap) 09/09/2023   Bone Density Scan  11/11/2023   COVID-19 Vaccine (4 - 2025-26 season) 12/30/2023   Colonoscopy  02/27/2024   FOOT EXAM  03/18/2024   HEMOGLOBIN A1C  03/18/2024   Mammogram  03/31/2024   OPHTHALMOLOGY EXAM  06/24/2024   Diabetic kidney evaluation - eGFR measurement  09/15/2024   Diabetic kidney evaluation - Urine ACR  09/15/2024   Medicare Annual Wellness (AWV)  03/18/2025   Pneumococcal Vaccine: 50+ Years  Completed   Influenza Vaccine  Completed   Hepatitis C Screening  Completed   Zoster Vaccines- Shingrix  Completed   Meningococcal B Vaccine  Aged Out    Discussed health benefits of physical activity, and encouraged her to engage in regular exercise appropriate for her age and condition.  Problem List Items Addressed This Visit       Cardiovascular and Mediastinum   Hypertension associated with diabetes (HCC)   Blood pressure readings elevated at 146/87 in office and 145/85 at home. Current medications include atenolol, amlodipine , and losartan . Discussed potential for white coat syndrome and importance of maintaining blood pressure under 140/90 for cardiovascular health.  Consideration of increasing losartan  dosage for better control. - Increased losartan  to 25 mg daily. - Continue atenolol 50 mg twice daily. - Continue amlodipine  5 mg daily. - Monitor blood pressure at home and report if consistently elevated. - Scheduled follow-up in 3 months to reassess blood pressure.      Relevant Medications   losartan  (COZAAR ) 25 MG tablet   Other Relevant Orders   Comprehensive metabolic panel with GFR     Respiratory   OSA (obstructive sleep apnea)   Mild obstructive sleep apnea noted on previous home sleep study. Discussed potential contribution to daytime drowsiness. No current use of CPAP due to personal preference. - Will reconsider CPAP if daytime drowsiness persists.        Endocrine   T2DM (type 2 diabetes mellitus) (HCC)   Type 2 diabetes managed with current medications. Discussed importance of blood pressure control for kidney protection.      Relevant Medications   losartan  (COZAAR ) 25 MG tablet   Other Relevant Orders   Hemoglobin A1c   Hyperlipidemia associated with type 2 diabetes mellitus (HCC)   Hyperlipidemia managed with Zetia  10 mg daily. Most recent LDL was under 130. - Check cholesterol levels to see how the ezetimibe  has been doing for her.        Relevant Medications   losartan  (COZAAR ) 25 MG tablet   Other Relevant Orders   Comprehensive metabolic panel with GFR   Lipid panel   Subclinical hypothyroidism   Recheck TSH and free T4      Relevant Orders   TSH + free T4     Other   Excessive daytime sleepiness   Other Visit Diagnoses       Encounter for annual physical exam    -  Primary   Relevant Orders   Hemoglobin A1c   Comprehensive metabolic panel with GFR  Lipid panel   TSH + free T4          Adult Wellness Visit Routine adult wellness visit with discussion on general health maintenance, including screenings and vaccinations. - Continue with scheduled mammogram in January. - Continue with scheduled  bone density scan in January. - Continue with scheduled colonoscopy in March. - Check with pharmacy regarding tetanus vaccination status and receive if not up to date. - Ordered routine labs including thyroid , kidney, liver function, cholesterol, and A1c.  Excessive daytime sleepiness Potentially related to mild obstructive sleep apnea and poor sleep hygiene. Discussed magnesium  glycinate as a supplement to improve sleep quality. - Recommended magnesium  glycinate supplement for relaxation and improved sleep. - Will consider referral to sleep specialist if symptoms persist.  Tinnitus and hearing loss Chronic tinnitus and hearing loss, potentially exacerbated by past noise exposure. Discussed potential benefits of hearing aids for tinnitus management and improved hearing. - Consider evaluation for hearing aids to manage tinnitus and improve hearing.       Return in about 3 months (around 06/29/2024) for chronic disease f/u.     Jon Eva, MD  [facility] [phone] (phone) [phone] (fax)  [facility]

## 2024-03-31 NOTE — Addendum Note (Signed)
 Addended by: MYRLA JON HERO on: 03/31/2024 12:03 PM   Modules accepted: Orders

## 2024-03-31 NOTE — Assessment & Plan Note (Signed)
 Hyperlipidemia managed with Zetia  10 mg daily. Most recent LDL was under 130. - Check cholesterol levels to see how the ezetimibe  has been doing for her.

## 2024-04-01 LAB — LIPID PANEL
Chol/HDL Ratio: 3.5 ratio (ref 0.0–4.4)
Cholesterol, Total: 204 mg/dL — ABNORMAL HIGH (ref 100–199)
HDL: 58 mg/dL (ref >39)
LDL Chol Calc (NIH): 126 mg/dL — ABNORMAL HIGH (ref 0–99)
Triglycerides: 115 mg/dL (ref 0–149)
VLDL Cholesterol Cal: 20 mg/dL (ref 5–40)

## 2024-04-01 LAB — COMPREHENSIVE METABOLIC PANEL WITH GFR
ALT: 23 IU/L (ref 0–32)
AST: 21 IU/L (ref 0–40)
Albumin: 4.7 g/dL (ref 3.8–4.8)
Alkaline Phosphatase: 54 IU/L (ref 49–135)
BUN/Creatinine Ratio: 15 (ref 12–28)
BUN: 9 mg/dL (ref 8–27)
Bilirubin Total: 0.7 mg/dL (ref 0.0–1.2)
CO2: 27 mmol/L (ref 20–29)
Calcium: 9.7 mg/dL (ref 8.7–10.3)
Chloride: 99 mmol/L (ref 96–106)
Creatinine, Ser: 0.6 mg/dL (ref 0.57–1.00)
Globulin, Total: 2.2 g/dL (ref 1.5–4.5)
Glucose: 109 mg/dL — ABNORMAL HIGH (ref 70–99)
Potassium: 5 mmol/L (ref 3.5–5.2)
Sodium: 140 mmol/L (ref 134–144)
Total Protein: 6.9 g/dL (ref 6.0–8.5)
eGFR: 96 mL/min/1.73 (ref >59)

## 2024-04-01 LAB — HEMOGLOBIN A1C
Est. average glucose Bld gHb Est-mCnc: 131 mg/dL
Hgb A1c MFr Bld: 6.2 % — ABNORMAL HIGH (ref 4.8–5.6)

## 2024-04-01 LAB — TSH+FREE T4
Free T4: 1.21 ng/dL (ref 0.82–1.77)
TSH: 2.82 u[IU]/mL (ref 0.450–4.500)

## 2024-04-07 ENCOUNTER — Encounter

## 2024-04-13 ENCOUNTER — Ambulatory Visit: Payer: Self-pay | Admitting: Family Medicine

## 2024-04-15 ENCOUNTER — Other Ambulatory Visit: Payer: Self-pay | Admitting: Family Medicine

## 2024-04-15 DIAGNOSIS — E1169 Type 2 diabetes mellitus with other specified complication: Secondary | ICD-10-CM

## 2024-05-05 ENCOUNTER — Ambulatory Visit
Admission: RE | Admit: 2024-05-05 | Discharge: 2024-05-05 | Disposition: A | Discharge status: Home / Self Care | Source: Ambulatory Visit | Attending: Family Medicine | Admitting: Family Medicine

## 2024-05-05 ENCOUNTER — Ambulatory Visit
Admission: RE | Admit: 2024-05-05 | Discharge: 2024-05-05 | Disposition: A | Source: Ambulatory Visit | Attending: Family Medicine

## 2024-05-05 DIAGNOSIS — M810 Age-related osteoporosis without current pathological fracture: Secondary | ICD-10-CM | POA: Insufficient documentation

## 2024-05-05 DIAGNOSIS — Z13820 Encounter for screening for osteoporosis: Secondary | ICD-10-CM | POA: Diagnosis present

## 2024-05-05 DIAGNOSIS — Z1231 Encounter for screening mammogram for malignant neoplasm of breast: Secondary | ICD-10-CM

## 2024-05-06 ENCOUNTER — Ambulatory Visit: Payer: Self-pay | Admitting: Family Medicine

## 2024-05-07 ENCOUNTER — Ambulatory Visit: Payer: Self-pay | Admitting: Family Medicine

## 2024-05-11 ENCOUNTER — Encounter: Payer: Self-pay | Admitting: *Deleted

## 2024-05-13 ENCOUNTER — Encounter: Payer: Self-pay | Admitting: Family Medicine

## 2024-05-13 ENCOUNTER — Other Ambulatory Visit: Payer: Self-pay | Admitting: Family Medicine

## 2024-05-14 ENCOUNTER — Telehealth: Payer: Self-pay

## 2024-05-14 DIAGNOSIS — L4050 Arthropathic psoriasis, unspecified: Secondary | ICD-10-CM

## 2024-05-14 NOTE — Telephone Encounter (Signed)
 Copied from CRM [id number]. Topic: Referral - Request for Referral >> May 14, 2024 10:21 AM Charolett L wrote: Did the patient discuss referral with their provider in the last year? Yes (If No - schedule appointment) (If Yes - send message)  Appointment offered? Yes  Type of order/referral and detailed reason for visit:  Primary Diagnoses Code L40.50 Code needs to be listed on the referral  Preference of office, provider, location: Dr. Jon [facility] [address] [employer] [address]  If referral order, have you been seen by this specialty before? Yes (If Yes, this issue or another issue? When? Where? 10/2023  Can we respond through MyChart? Yes

## 2024-05-14 NOTE — Telephone Encounter (Signed)
Ok to send referral as requested 

## 2024-05-15 NOTE — Telephone Encounter (Signed)
 Referral ordered per Dr. Bacigalupo

## 2024-06-30 ENCOUNTER — Ambulatory Visit: Admitting: Family Medicine

## 2024-07-06 ENCOUNTER — Ambulatory Visit: Admit: 2024-07-06 | Admitting: Gastroenterology

## 2024-07-06 SURGERY — COLONOSCOPY
Anesthesia: General

## 2025-03-24 ENCOUNTER — Ambulatory Visit
